# Patient Record
Sex: Male | Born: 1935 | Race: White | Hispanic: No | Smoking: Never smoker
Health system: Southern US, Community
[De-identification: ages and names within clinical notes are randomized; demographics above are authoritative.]

## PROBLEM LIST (undated history)

## (undated) DIAGNOSIS — I82890 Acute embolism and thrombosis of other specified veins: Secondary | ICD-10-CM

## (undated) DIAGNOSIS — I639 Cerebral infarction, unspecified: Secondary | ICD-10-CM

## (undated) DIAGNOSIS — K219 Gastro-esophageal reflux disease without esophagitis: Secondary | ICD-10-CM

## (undated) DIAGNOSIS — E119 Type 2 diabetes mellitus without complications: Secondary | ICD-10-CM

## (undated) DIAGNOSIS — K852 Alcohol induced acute pancreatitis without necrosis or infection: Secondary | ICD-10-CM

## (undated) DIAGNOSIS — I10 Essential (primary) hypertension: Secondary | ICD-10-CM

## (undated) HISTORY — PX: APPENDECTOMY: SHX54

## (undated) HISTORY — PX: VASCULAR SURGERY: SHX849

## (undated) HISTORY — PX: CAROTID ANGIOGRAPHY: CATH118230

## (undated) HISTORY — PX: OTHER SURGICAL HISTORY: SHX169

---

## 2018-08-10 ENCOUNTER — Other Ambulatory Visit: Payer: Self-pay

## 2018-08-10 ENCOUNTER — Emergency Department (HOSPITAL_COMMUNITY): Payer: Medicare Other

## 2018-08-10 ENCOUNTER — Emergency Department (HOSPITAL_COMMUNITY)
Admission: EM | Admit: 2018-08-10 | Discharge: 2018-08-11 | Disposition: A | Discharge status: Home / Self Care | Payer: Medicare Other | Attending: Emergency Medicine | Admitting: Emergency Medicine

## 2018-08-10 ENCOUNTER — Encounter (HOSPITAL_COMMUNITY): Payer: Self-pay | Admitting: Emergency Medicine

## 2018-08-10 DIAGNOSIS — W010XXA Fall on same level from slipping, tripping and stumbling without subsequent striking against object, initial encounter: Secondary | ICD-10-CM | POA: Insufficient documentation

## 2018-08-10 DIAGNOSIS — Y929 Unspecified place or not applicable: Secondary | ICD-10-CM | POA: Insufficient documentation

## 2018-08-10 DIAGNOSIS — S0181XA Laceration without foreign body of other part of head, initial encounter: Secondary | ICD-10-CM | POA: Insufficient documentation

## 2018-08-10 DIAGNOSIS — S43005A Unspecified dislocation of left shoulder joint, initial encounter: Secondary | ICD-10-CM | POA: Insufficient documentation

## 2018-08-10 DIAGNOSIS — Z23 Encounter for immunization: Secondary | ICD-10-CM | POA: Insufficient documentation

## 2018-08-10 DIAGNOSIS — E119 Type 2 diabetes mellitus without complications: Secondary | ICD-10-CM | POA: Insufficient documentation

## 2018-08-10 DIAGNOSIS — Y998 Other external cause status: Secondary | ICD-10-CM | POA: Diagnosis not present

## 2018-08-10 DIAGNOSIS — I10 Essential (primary) hypertension: Secondary | ICD-10-CM | POA: Insufficient documentation

## 2018-08-10 DIAGNOSIS — Y9389 Activity, other specified: Secondary | ICD-10-CM | POA: Insufficient documentation

## 2018-08-10 DIAGNOSIS — S0990XA Unspecified injury of head, initial encounter: Secondary | ICD-10-CM | POA: Diagnosis present

## 2018-08-10 HISTORY — DX: Alcohol induced acute pancreatitis without necrosis or infection: K85.20

## 2018-08-10 HISTORY — DX: Essential (primary) hypertension: I10

## 2018-08-10 HISTORY — DX: Type 2 diabetes mellitus without complications: E11.9

## 2018-08-10 HISTORY — DX: Gastro-esophageal reflux disease without esophagitis: K21.9

## 2018-08-10 MED ORDER — TETANUS-DIPHTH-ACELL PERTUSSIS 5-2.5-18.5 LF-MCG/0.5 IM SUSP
0.5000 mL | Freq: Once | INTRAMUSCULAR | Status: AC
  Administered 2018-08-10: 0.5 mL via INTRAMUSCULAR
  Filled 2018-08-10: qty 0.5

## 2018-08-10 MED ORDER — LIDOCAINE-EPINEPHRINE (PF) 1 %-1:200000 IJ SOLN
10.0000 mL | Freq: Once | INTRAMUSCULAR | Status: AC
  Administered 2018-08-10: 10 mL
  Filled 2018-08-10: qty 30

## 2018-08-10 NOTE — ED Triage Notes (Addendum)
Pt to ED via [facility] after fall. Pt has been drinking ETOH, got up to use the restroom and fell. Pt has laceration above the left eye and left arm pain. Pt currently taking Plavix but is unsure right now why.

## 2018-08-10 NOTE — ED Notes (Signed)
Pt cleaned up, placed in gown, suture cart at bedside if needed.

## 2018-08-10 NOTE — ED Provider Notes (Signed)
[facility] EMERGENCY DEPARTMENT Provider Note   CSN: [id number] Arrival date & time: 08/10/18  2235    History   Chief Complaint Chief Complaint  Patient presents with  . Fall  . Laceration    HPI Jon Bennett is a 84 y.o. male.     Patient presents for evaluation after a fall.  He admits that he excessively drank alcohol tonight and lost his balance, causing him to fall.  He reports trying to catch himself when he fell and injuring his left arm.  He is complaining of pain at the area of the left shoulder and upper arm region.  Pain worsens with movement.  He does have a headache, complains of a laceration on his forehead.     Past Medical History:  Diagnosis Date  . Diabetes mellitus without complication (HCC)   . GERD (gastroesophageal reflux disease)   . Hypertension   . Pancreatitis, alcoholic, acute     There are no active problems to display for this patient.   Past Surgical History:  Procedure Laterality Date  . VASCULAR SURGERY          Home Medications    Prior to Admission medications   Not on File    Family History No family history on file.  Social History Social History   Tobacco Use  . Smoking status: Never Smoker  . Smokeless tobacco: Never Used  Substance Use Topics  . Alcohol use: Yes  . Drug use: Never     Allergies   Asa [aspirin]   Review of Systems Review of Systems  Musculoskeletal: Positive for arthralgias.  Skin: Positive for wound.  Neurological: Positive for headaches.  All other systems reviewed and are negative.    Physical Exam Updated Vital Signs BP 108/89   Pulse (!) 101   Temp 98.4 F (36.9 C) (Oral)   Resp 18   Ht  (1.753 m)   Wt 78 kg   SpO2 93%   BMI 25.40 kg/m   Physical Exam Vitals signs and nursing note reviewed.  Constitutional:      General: He is not in acute distress.    Appearance: Normal appearance. He is well-developed.  HENT:     Head: Normocephalic. Contusion (Left  side of forehead) and laceration (Left forehead) present.      Right Ear: Hearing normal.     Left Ear: Hearing normal.     Nose: Nose normal.  Eyes:     Conjunctiva/sclera: Conjunctivae normal.     Pupils: Pupils are equal, round, and reactive to light.  Neck:     Musculoskeletal: Normal range of motion and neck supple.  Cardiovascular:     Rate and Rhythm: Regular rhythm.     Heart sounds: S1 normal and S2 normal. No murmur. No friction rub. No gallop.   Pulmonary:     Effort: Pulmonary effort is normal. No respiratory distress.     Breath sounds: Normal breath sounds.  Chest:     Chest wall: No tenderness.  Abdominal:     General: Bowel sounds are normal.     Palpations: Abdomen is soft.     Tenderness: There is no abdominal tenderness. There is no guarding or rebound. Negative signs include Murphy's sign and McBurney's sign.     Hernia: No hernia is present.  Musculoskeletal:     Left shoulder: He exhibits decreased range of motion and tenderness (Proximal humerus). He exhibits no deformity.     Left elbow: He exhibits  deformity. Tenderness found.  Skin:    General: Skin is warm and dry.     Findings: Laceration (Left forehead) present. No rash.  Neurological:     Mental Status: He is alert and oriented to person, place, and time.     GCS: GCS eye subscore is 4. GCS verbal subscore is 5. GCS motor subscore is 6.     Cranial Nerves: No cranial nerve deficit.     Sensory: No sensory deficit.     Coordination: Coordination normal.  Psychiatric:        Speech: Speech normal.        Behavior: Behavior normal.        Thought Content: Thought content normal.      ED Treatments / Results  Labs (all labs ordered are listed, but only abnormal results are displayed) Labs Reviewed - No data to display  EKG None  Radiology Dg Elbow Complete Left  Result Date: 08/11/2018 CLINICAL DATA:  Recent fall with left-sided elbow pain, initial encounter EXAM: LEFT ELBOW - COMPLETE  3+ VIEW COMPARISON:  None. FINDINGS: Mild osteophytic changes are noted from the olecranon. No acute fracture or dislocation is noted. IMPRESSION: Mild degenerative change without acute abnormality. Electronically Signed   By: Alcide Clever M.D.   On: 08/11/2018 01:53   Ct Head Wo Contrast  Result Date: 08/11/2018 CLINICAL DATA:  Recent fall EXAM: CT HEAD WITHOUT CONTRAST CT CERVICAL SPINE WITHOUT CONTRAST TECHNIQUE: Multidetector CT imaging of the head and cervical spine was performed following the standard protocol without intravenous contrast. Multiplanar CT image reconstructions of the cervical spine were also generated. COMPARISON:  None. FINDINGS: CT HEAD FINDINGS Brain: Mild atrophic changes are noted. Chronic white matter ischemic changes seen. No findings to suggest acute hemorrhage, acute infarction or space-occupying mass lesion are noted. Vascular: No hyperdense vessel or unexpected calcification. Skull: Normal. Negative for fracture or focal lesion. Sinuses/Orbits: No acute finding. Other: Mild soft tissue swelling is noted in the left supraorbital region. CT CERVICAL SPINE FINDINGS Alignment: Within normal limits. Skull base and vertebrae: 7 cervical segments are well visualized. Vertebral body height is well maintained. Mild osteophytic changes noted. Facet hypertrophic changes are noted. No acute fracture or acute facet abnormality is noted. Soft tissues and spinal canal: Surrounding soft tissue structures show no acute abnormality. Upper chest: Lung apices are within normal limits. Other: None IMPRESSION: CT of the head: Chronic atrophic and ischemic changes. Left supraorbital soft tissue swelling consistent with the recent injury. CT of the cervical spine: Multilevel degenerative change without acute abnormality. Electronically Signed   By: Alcide Clever M.D.   On: 08/11/2018 01:49   Ct Cervical Spine Wo Contrast  Result Date: 08/11/2018 CLINICAL DATA:  Recent fall EXAM: CT HEAD WITHOUT  CONTRAST CT CERVICAL SPINE WITHOUT CONTRAST TECHNIQUE: Multidetector CT imaging of the head and cervical spine was performed following the standard protocol without intravenous contrast. Multiplanar CT image reconstructions of the cervical spine were also generated. COMPARISON:  None. FINDINGS: CT HEAD FINDINGS Brain: Mild atrophic changes are noted. Chronic white matter ischemic changes seen. No findings to suggest acute hemorrhage, acute infarction or space-occupying mass lesion are noted. Vascular: No hyperdense vessel or unexpected calcification. Skull: Normal. Negative for fracture or focal lesion. Sinuses/Orbits: No acute finding. Other: Mild soft tissue swelling is noted in the left supraorbital region. CT CERVICAL SPINE FINDINGS Alignment: Within normal limits. Skull base and vertebrae: 7 cervical segments are well visualized. Vertebral body height is well maintained. Mild osteophytic changes  noted. Facet hypertrophic changes are noted. No acute fracture or acute facet abnormality is noted. Soft tissues and spinal canal: Surrounding soft tissue structures show no acute abnormality. Upper chest: Lung apices are within normal limits. Other: None IMPRESSION: CT of the head: Chronic atrophic and ischemic changes. Left supraorbital soft tissue swelling consistent with the recent injury. CT of the cervical spine: Multilevel degenerative change without acute abnormality. Electronically Signed   By: Alcide Clever M.D.   On: 08/11/2018 01:49   Dg Shoulder Left  Result Date: 08/11/2018 CLINICAL DATA:  Recent fall with shoulder pain, initial encounter EXAM: LEFT SHOULDER - 2+ VIEW COMPARISON:  None. FINDINGS: Anterior inferior dislocation of the humeral head is noted with respect to the glenoid. Some fragmentation of the glenoid is seen. There are changes suspicious for an avulsion from the greater tuberosity. Degenerative changes of the acromioclavicular joint are seen IMPRESSION: Anterior inferior dislocation of  the humeral head. There are changes consistent with glenoid fracture as well as findings suspicious for a greater tuberosity fracture from the. Electronically Signed   By: Alcide Clever M.D.   On: 08/11/2018 01:54    Procedures .Marland KitchenLaceration Repair Date/Time: 08/11/2018 1:32 AM Performed by: Gilda Crease, MD Authorized by: Gilda Crease, MD   Consent:    Consent obtained:  Verbal   Consent given by:  Patient   Risks discussed:  Infection, pain and poor cosmetic result Universal protocol:    Procedure explained and questions answered to patient or proxy's satisfaction: yes     Relevant documents present and verified: yes     Test results available and properly labeled: yes     Imaging studies available: yes     Required blood products, implants, devices, and special equipment available: yes     Site/side marked: yes     Immediately prior to procedure, a time out was called: yes     Patient identity confirmed:  Verbally with patient Anesthesia (see MAR for exact dosages):    Anesthesia method:  Local infiltration   Local anesthetic:  Lidocaine 1% WITH epi Laceration details:    Location:  Face   Face location:  Forehead   Length (cm):  1.5 Repair type:    Repair type:  Simple Pre-procedure details:    Preparation:  Patient was prepped and draped in usual sterile fashion and imaging obtained to evaluate for foreign bodies Exploration:    Hemostasis achieved with:  Epinephrine   Contaminated: no   Treatment:    Area cleansed with:  Shur-Clens   Amount of cleaning:  Standard   Irrigation solution:  Sterile saline   Irrigation method:  Syringe Skin repair:    Repair method:  Sutures   Suture size:  5-0   Suture material:  Fast-absorbing gut   Number of sutures:  3 Approximation:    Approximation:  Close Post-procedure details:    Dressing:  Open (no dressing)  .Sedation Date/Time: 08/11/2018 2:24 AM Performed by: Gilda Crease, MD Authorized by:  Gilda Crease, MD   Consent:    Consent obtained:  Verbal   Consent given by:  Patient   Risks discussed:  Prolonged hypoxia resulting in organ damage, respiratory compromise necessitating ventilatory assistance and intubation and inadequate sedation Universal protocol:    Procedure explained and questions answered to patient or proxy's satisfaction: yes     Relevant documents present and verified: yes     Test results available and properly labeled: yes     Imaging  studies available: yes     Required blood products, implants, devices, and special equipment available: yes     Site/side marked: yes     Immediately prior to procedure a time out was called: yes     Patient identity confirmation method:  Verbally with patient Indications:    Procedure performed:  Dislocation reduction   Procedure necessitating sedation performed by:  Physician performing sedation Pre-sedation assessment:    Time since last food or drink:  8 hours   ASA classification: class 2 - patient with mild systemic disease     Neck mobility: reduced     Mouth opening:  3 or more finger widths   Thyromental distance:  3 finger widths   Mallampati score:  I - soft palate, uvula, fauces, pillars visible   Pre-sedation assessments completed and reviewed: airway patency, cardiovascular function, hydration status, mental status, nausea/vomiting, pain level, respiratory function and temperature     Pre-sedation assessment completed:  08/11/2018 2:20 AM Immediate pre-procedure details:    Reassessment: Patient reassessed immediately prior to procedure     Reviewed: vital signs, relevant labs/tests and NPO status     Verified: bag valve mask available, emergency equipment available, intubation equipment available, IV patency confirmed and oxygen available   Procedure details (see MAR for exact dosages):    Preoxygenation:  Nasal cannula   Sedation:  Ketamine and propofol   Intra-procedure monitoring:  Blood  pressure monitoring, cardiac monitor, continuous pulse oximetry, frequent LOC assessments and frequent vital sign checks   Intra-procedure events: none     Total Provider sedation time (minutes):  20 Post-procedure details:    Post-sedation assessment completed:  08/11/2018 2:45 AM   Attendance: Constant attendance by certified staff until patient recovered     Recovery: Patient returned to pre-procedure baseline     Post-sedation assessments completed and reviewed: airway patency, cardiovascular function, hydration status, mental status, nausea/vomiting, pain level and respiratory function     Patient is stable for discharge or admission: yes   .Ortho Injury Treatment Date/Time: 08/11/2018 2:26 AM Performed by: Gilda CreasePollina, Romona Murdy J, MD Authorized by: Gilda CreasePollina, Markiah Janeway J, MD   Consent:    Consent obtained:  Verbal   Consent given by:  Patient   Risks discussed:  Fracture, nerve damage, restricted joint movement, vascular damage, recurrent dislocation and irreducible dislocation Universal protocol:    Procedure explained and questions answered to patient or proxy's satisfaction: yes     Relevant documents present and verified: yes     Test results available and properly labeled: yes     Imaging studies available: yes     Required blood products, implants, devices, and special equipment available: yes     Site/side marked: yes     Immediately prior to procedure a time out was called: yes     Patient identity confirmed:  Verbally with patientInjury location: shoulder Location details: left shoulder Injury type: dislocation Dislocation type: anterior Chronicity: new Pre-procedure neurovascular assessment: neurovascularly intact Pre-procedure distal perfusion: normal Pre-procedure neurological function: normal Pre-procedure range of motion: reduced  Anesthesia: Local anesthesia used: no  Patient sedated: Yes. Refer to sedation procedure documentation for details of sedation.  Manipulation performed: yes Reduction method: external rotation, Milch technique, scapular manipulation and traction and counter traction Reduction successful: yes X-ray confirmed reduction: yes Immobilization: sling Post-procedure neurovascular assessment: post-procedure neurovascularly intact Post-procedure distal perfusion: normal Post-procedure neurological function: normal Post-procedure range of motion: improved Patient tolerance: Patient tolerated the procedure well with no immediate complications    (  including critical care time)  Medications Ordered in ED Medications  ketamine (KETALAR) injection 39 mg (has no administration in time range)  propofol (DIPRIVAN) 10 mg/mL bolus/IV push 39 mg (has no administration in time range)  ondansetron (ZOFRAN) 4 MG/2ML injection (has no administration in time range)  propofol (DIPRIVAN) 10 mg/mL bolus/IV push 39 mg (has no administration in time range)  ondansetron (ZOFRAN) 4 MG/5ML solution 4 mg (has no administration in time range)  Tdap (BOOSTRIX) injection 0.5 mL (0.5 mLs Intramuscular Given 08/10/18 2347)  lidocaine-EPINEPHrine (XYLOCAINE-EPINEPHrine) 1 %-1:200000 (PF) injection 10 mL (10 mLs Infiltration Given by Other 08/10/18 2348)     Initial Impression / Assessment and Plan / ED Course  I have reviewed the triage vital signs and the nursing notes.  Pertinent labs & imaging results that were available during my care of the patient were reviewed by me and considered in my medical decision making (see chart for details).        Patient presents to the emergency department for evaluation after a fall.  Patient admits to excessive alcohol intake tonight which was the cause of the fall.  No syncope or loss of consciousness.  CT head and cervical spine performed, no acute injury noted.  Patient complaining of left elbow and left shoulder injury.  X-ray shows dislocation of left shoulder with likely tuberosity and glenoid fracture.   This was discussed with the patient, he did give consent for closed reduction which was performed without difficulty.  Patient placed in a sling sling immobilizer, will follow-up with orthopedics.  Patient had a laceration on his forehead that was repaired.  This was repaired with fast-absorbing gut, will not require suture removal.  Watch for signs of infection.  Final Clinical Impressions(s) / ED Diagnoses   Final diagnoses:  Facial laceration, initial encounter  Shoulder dislocation, left, initial encounter    ED Discharge Orders    None       Abbegayle Denault, Canary Brim, MD 08/11/18 [id number]

## 2018-08-11 ENCOUNTER — Other Ambulatory Visit: Payer: Self-pay

## 2018-08-11 ENCOUNTER — Emergency Department (HOSPITAL_COMMUNITY): Payer: Medicare Other

## 2018-08-11 DIAGNOSIS — F10231 Alcohol dependence with withdrawal delirium: Secondary | ICD-10-CM | POA: Diagnosis not present

## 2018-08-11 MED ORDER — PROPOFOL 10 MG/ML IV BOLUS
0.5000 mg/kg | Freq: Once | INTRAVENOUS | Status: AC
  Administered 2018-08-11: 39 mg via INTRAVENOUS
  Filled 2018-08-11: qty 20

## 2018-08-11 MED ORDER — PROPOFOL 10 MG/ML IV BOLUS
0.5000 mg/kg | Freq: Once | INTRAVENOUS | Status: AC
  Administered 2018-08-11: 39 mg via INTRAVENOUS

## 2018-08-11 MED ORDER — KETAMINE HCL 10 MG/ML IJ SOLN
0.5000 mg/kg | Freq: Once | INTRAMUSCULAR | Status: AC
  Administered 2018-08-11: 39 mg via INTRAVENOUS
  Filled 2018-08-11: qty 1

## 2018-08-11 MED ORDER — ONDANSETRON HCL 4 MG/5ML PO SOLN: 4.0000 mg | Freq: Once | ORAL | Status: DC

## 2018-08-11 MED ORDER — ONDANSETRON HCL 4 MG/2ML IJ SOLN
INTRAMUSCULAR | Status: AC
  Administered 2018-08-11: 4 mg
  Filled 2018-08-11: qty 2

## 2018-08-11 MED ORDER — FAMOTIDINE 20 MG PO TABS
40.0000 mg | ORAL_TABLET | Freq: Once | ORAL | Status: AC
  Administered 2018-08-11: 40 mg via ORAL
  Filled 2018-08-11: qty 2

## 2018-08-11 NOTE — Discharge Instructions (Addendum)
You will need to keep your arm in the sling immobilizer at all times until you see orthopedic surgeon for follow-up.  The sutures that we placed in your forehead today will dissolve, you do not need to be removed.  Watch for signs of infection, return if they occur.

## 2018-08-11 NOTE — ED Notes (Signed)
Patient transported to CT and X ray 

## 2018-08-13 ENCOUNTER — Other Ambulatory Visit: Payer: Self-pay

## 2018-08-13 ENCOUNTER — Encounter (HOSPITAL_COMMUNITY): Payer: Self-pay | Admitting: Emergency Medicine

## 2018-08-13 ENCOUNTER — Inpatient Hospital Stay (HOSPITAL_COMMUNITY)
Admission: EM | Admit: 2018-08-13 | Discharge: 2018-08-18 | DRG: 897 | Disposition: A | Discharge status: Skilled Nursing Facility | Payer: Medicare Other | Attending: Internal Medicine | Admitting: Internal Medicine

## 2018-08-13 ENCOUNTER — Emergency Department (HOSPITAL_COMMUNITY): Payer: Medicare Other

## 2018-08-13 DIAGNOSIS — W19XXXD Unspecified fall, subsequent encounter: Secondary | ICD-10-CM

## 2018-08-13 DIAGNOSIS — F10931 Alcohol use, unspecified with withdrawal delirium: Secondary | ICD-10-CM

## 2018-08-13 DIAGNOSIS — K709 Alcoholic liver disease, unspecified: Secondary | ICD-10-CM | POA: Diagnosis present

## 2018-08-13 DIAGNOSIS — F10232 Alcohol dependence with withdrawal with perceptual disturbance: Secondary | ICD-10-CM | POA: Diagnosis not present

## 2018-08-13 DIAGNOSIS — D6959 Other secondary thrombocytopenia: Secondary | ICD-10-CM | POA: Diagnosis present

## 2018-08-13 DIAGNOSIS — I10 Essential (primary) hypertension: Secondary | ICD-10-CM | POA: Diagnosis present

## 2018-08-13 DIAGNOSIS — F10939 Alcohol use, unspecified with withdrawal, unspecified: Secondary | ICD-10-CM | POA: Diagnosis present

## 2018-08-13 DIAGNOSIS — D696 Thrombocytopenia, unspecified: Secondary | ICD-10-CM

## 2018-08-13 DIAGNOSIS — F329 Major depressive disorder, single episode, unspecified: Secondary | ICD-10-CM | POA: Diagnosis present

## 2018-08-13 DIAGNOSIS — R739 Hyperglycemia, unspecified: Secondary | ICD-10-CM | POA: Diagnosis present

## 2018-08-13 DIAGNOSIS — R197 Diarrhea, unspecified: Secondary | ICD-10-CM | POA: Diagnosis present

## 2018-08-13 DIAGNOSIS — F419 Anxiety disorder, unspecified: Secondary | ICD-10-CM | POA: Diagnosis present

## 2018-08-13 DIAGNOSIS — S42292D Other displaced fracture of upper end of left humerus, subsequent encounter for fracture with routine healing: Secondary | ICD-10-CM

## 2018-08-13 DIAGNOSIS — F10239 Alcohol dependence with withdrawal, unspecified: Secondary | ICD-10-CM | POA: Diagnosis present

## 2018-08-13 DIAGNOSIS — Z1159 Encounter for screening for other viral diseases: Secondary | ICD-10-CM

## 2018-08-13 DIAGNOSIS — F10231 Alcohol dependence with withdrawal delirium: Principal | ICD-10-CM | POA: Diagnosis present

## 2018-08-13 LAB — BASIC METABOLIC PANEL
Anion gap: 12 (ref 5–15)
BUN: 14 mg/dL (ref 8–23)
CO2: 24 mmol/L (ref 22–32)
Calcium: 9.1 mg/dL (ref 8.9–10.3)
Chloride: 107 mmol/L (ref 98–111)
Creatinine, Ser: 0.93 mg/dL (ref 0.61–1.24)
GFR calc Af Amer: >60 mL/min (ref >60)
GFR calc non Af Amer: >60 mL/min (ref >60)
Glucose, Bld: 133 mg/dL — ABNORMAL HIGH (ref 70–99)
Potassium: 3.5 mmol/L (ref 3.5–5.1)
Sodium: 143 mmol/L (ref 135–145)

## 2018-08-13 LAB — CBC WITH DIFFERENTIAL/PLATELET
Abs Immature Granulocytes: 0.04 10*3/uL (ref 0.00–0.07)
Basophils Absolute: 0 10*3/uL (ref 0.0–0.1)
Basophils Relative: 0 %
Eosinophils Absolute: 0.1 10*3/uL (ref 0.0–0.5)
Eosinophils Relative: 1 %
HCT: 45 % (ref 39.0–52.0)
Hemoglobin: 15.5 g/dL (ref 13.0–17.0)
Immature Granulocytes: 0 %
Lymphocytes Relative: 12 %
Lymphs Abs: 1.3 10*3/uL (ref 0.7–4.0)
MCH: 32.4 pg (ref 26.0–34.0)
MCHC: 34.4 g/dL (ref 30.0–36.0)
MCV: 94.1 fL (ref 80.0–100.0)
Monocytes Absolute: 0.9 10*3/uL (ref 0.1–1.0)
Monocytes Relative: 8 %
Neutro Abs: 8.8 10*3/uL — ABNORMAL HIGH (ref 1.7–7.7)
Neutrophils Relative %: 79 %
Platelets: 154 10*3/uL (ref 150–400)
RBC: 4.78 MIL/uL (ref 4.22–5.81)
RDW: 12.8 % (ref 11.5–15.5)
WBC: 11.2 10*3/uL — ABNORMAL HIGH (ref 4.0–10.5)
nRBC: 0 % (ref 0.0–0.2)

## 2018-08-13 LAB — URINALYSIS, ROUTINE W REFLEX MICROSCOPIC
Bilirubin Urine: NEGATIVE
Glucose, UA: NEGATIVE mg/dL
Ketones, ur: 20 mg/dL — AB
Nitrite: POSITIVE — AB
Protein, ur: NEGATIVE mg/dL
Specific Gravity, Urine: 1.021 (ref 1.005–1.030)
pH: 5 (ref 5.0–8.0)

## 2018-08-13 LAB — RAPID URINE DRUG SCREEN, HOSP PERFORMED
Amphetamines: NOT DETECTED
Barbiturates: NOT DETECTED
Benzodiazepines: POSITIVE — AB
Cocaine: NOT DETECTED
Opiates: NOT DETECTED
Tetrahydrocannabinol: NOT DETECTED

## 2018-08-13 LAB — ETHANOL: Alcohol, Ethyl (B): <10 mg/dL (ref <10)

## 2018-08-13 LAB — SARS CORONAVIRUS 2 BY RT PCR (HOSPITAL ORDER, PERFORMED IN ~~LOC~~ HOSPITAL LAB): SARS Coronavirus 2: NEGATIVE

## 2018-08-13 LAB — GLUCOSE, CAPILLARY
Glucose-Capillary: 114 mg/dL — ABNORMAL HIGH (ref 70–99)
Glucose-Capillary: 102 mg/dL — ABNORMAL HIGH (ref 70–99)
Glucose-Capillary: 99 mg/dL (ref 70–99)

## 2018-08-13 LAB — HEMOGLOBIN A1C
Hgb A1c MFr Bld: 5.2 % (ref 4.8–5.6)
Mean Plasma Glucose: 102.54 mg/dL

## 2018-08-13 MED ORDER — LORAZEPAM 1 MG PO TABS: 0.0000 mg | ORAL_TABLET | Freq: Four times a day (QID) | ORAL | Status: AC

## 2018-08-13 MED ORDER — DIAZEPAM 2 MG PO TABS
2.0000 mg | ORAL_TABLET | Freq: Once | ORAL | Status: AC
  Administered 2018-08-13: 09:00:00 2 mg via ORAL
  Filled 2018-08-13: qty 1

## 2018-08-13 MED ORDER — INSULIN ASPART 100 UNIT/ML ~~LOC~~ SOLN
0.0000 [IU] | Freq: Three times a day (TID) | SUBCUTANEOUS | Status: DC
  Administered 2018-08-15 – 2018-08-16 (×3): 1 [IU] via SUBCUTANEOUS

## 2018-08-13 MED ORDER — LORAZEPAM 2 MG/ML IJ SOLN
0.0000 mg | Freq: Four times a day (QID) | INTRAMUSCULAR | Status: AC
  Administered 2018-08-13: 12:00:00 2 mg via INTRAVENOUS
  Administered 2018-08-13: 05:00:00 1 mg via INTRAVENOUS
  Administered 2018-08-13 – 2018-08-15 (×6): 2 mg via INTRAVENOUS
  Filled 2018-08-13 (×8): qty 1

## 2018-08-13 MED ORDER — ACETAMINOPHEN 650 MG RE SUPP: 650.0000 mg | Freq: Four times a day (QID) | RECTAL | Status: DC | PRN

## 2018-08-13 MED ORDER — ACETAMINOPHEN 325 MG PO TABS
650.0000 mg | ORAL_TABLET | Freq: Four times a day (QID) | ORAL | Status: DC | PRN
  Administered 2018-08-13 – 2018-08-18 (×5): 650 mg via ORAL
  Filled 2018-08-13 (×6): qty 2

## 2018-08-13 MED ORDER — LORAZEPAM 1 MG PO TABS
0.0000 mg | ORAL_TABLET | Freq: Two times a day (BID) | ORAL | Status: AC
  Administered 2018-08-16: 2 mg via ORAL
  Administered 2018-08-17: 1 mg via ORAL
  Filled 2018-08-13: qty 1
  Filled 2018-08-13: qty 2

## 2018-08-13 MED ORDER — LORAZEPAM 2 MG/ML IJ SOLN
0.0000 mg | Freq: Two times a day (BID) | INTRAMUSCULAR | Status: AC
  Administered 2018-08-15 – 2018-08-16 (×2): 2 mg via INTRAVENOUS
  Filled 2018-08-13 (×2): qty 1

## 2018-08-13 MED ORDER — THIAMINE HCL 100 MG/ML IJ SOLN: 100.0000 mg | Freq: Every day | INTRAMUSCULAR | Status: DC

## 2018-08-13 MED ORDER — ONDANSETRON HCL 4 MG PO TABS: 4.0000 mg | ORAL_TABLET | Freq: Four times a day (QID) | ORAL | Status: DC | PRN

## 2018-08-13 MED ORDER — SODIUM CHLORIDE 0.9 % IV SOLN
INTRAVENOUS | Status: DC
  Administered 2018-08-13 (×2): via INTRAVENOUS

## 2018-08-13 MED ORDER — ONDANSETRON HCL 4 MG/2ML IJ SOLN
4.0000 mg | Freq: Four times a day (QID) | INTRAMUSCULAR | Status: DC | PRN
  Administered 2018-08-17: 4 mg via INTRAVENOUS
  Filled 2018-08-13: qty 2

## 2018-08-13 MED ORDER — VITAMIN B-1 100 MG PO TABS
100.0000 mg | ORAL_TABLET | Freq: Every day | ORAL | Status: DC
  Administered 2018-08-14 – 2018-08-18 (×5): 100 mg via ORAL
  Filled 2018-08-13 (×5): qty 1

## 2018-08-13 NOTE — Plan of Care (Signed)
  Problem: Safety: Goal: Ability to remain free from injury will improve Outcome: Progressing   Problem: Education: Goal: Knowledge of General Education information will improve Description Including pain rating scale, medication(s)/side effects and non-pharmacologic comfort measures Outcome: Progressing   Problem: Clinical Measurements: Goal: Ability to maintain clinical measurements within normal limits will improve Outcome: Progressing Goal: Will remain free from infection Outcome: Progressing Goal: Diagnostic test results will improve Outcome: Progressing Goal: Respiratory complications will improve Outcome: Progressing Goal: Cardiovascular complication will be avoided Outcome: Progressing   Problem: Coping: Goal: Level of anxiety will decrease Outcome: Progressing

## 2018-08-13 NOTE — ED Notes (Signed)
Emergency contact Julez Zong (son) [phone] Son contacted and advised patient will be admitted.

## 2018-08-13 NOTE — H&P (Addendum)
TRH H&P    Patient Demographics:    Jon Bennett, is a 84 y.o. male  MRN: [id number]  DOB - 30-Oct-1934  Admit Date - 08/13/2018  Referring MD/NP/PA: Dr. Wilkie Aye  Outpatient Primary MD for the patient is [name], MD  Patient coming from: Home  Chief complaint-altered mental status   HPI:    Jon Bennett  is a 84 y.o. male, with history of diabetes mellitus type 2, hypertension, recent fall with resultant left shoulder dislocation who was brought to ED with altered mental status.  Patient with his son and son reported that patient was having hallucinations with concern for alcohol withdrawal and so he was brought to ED.  As per son patient has not had any alcohol since Thursday.He does complain of left shoulder pain. There is no chest pain, no shortness of breath. Denies nausea vomiting or diarrhea. Patient son reported hospitalization 1 year ago when he was placed in a coma because of alcohol withdrawal.  Patient is a daily drinker.  No previous history of alcohol withdrawal seizures. In the ED patient received 1 mg of Ativan CIWA protocol initiated.    Review of systems:    In addition to the HPI above,   All other systems reviewed and are negative.    Past History of the following :    Past Medical History:  Diagnosis Date  . Diabetes mellitus without complication (HCC)   . GERD (gastroesophageal reflux disease)   . Hypertension   . Pancreatitis, alcoholic, acute       Past Surgical History:  Procedure Laterality Date  . VASCULAR SURGERY        Social History:      Social History   Tobacco Use  . Smoking status: Never Smoker  . Smokeless tobacco: Never Used  Substance Use Topics  . Alcohol use: Yes    Alcohol/week: 4.0 - 5.0 standard drinks    Types: 4 - 5 Shots of liquor per week    Comment: "half a pint of whiskey daily"       Family History :   Unobtainable as  patient unable to provide significant history.   Home Medications:   Prior to Admission medications   Not on File     Allergies:     Allergies  Allergen Reactions  . Asa [Aspirin] Shortness Of Breath     Physical Exam:   Vitals  Blood pressure (!) 148/98, pulse 91, temperature 98 F (36.7 C), temperature source Oral, resp. rate 18, height 5\' 9"  (1.753 m), weight 77.1 kg, SpO2 98 %.  1.  General: Appears in no acute distress  2. Psychiatric: Alert, oriented x2, lacks insight and judgment  3. Neurologic: Cranial nerves II through grossly intact, moving all extremities  4. HEENMT:  Atraumatic normocephalic, extraocular muscle intact  5. Respiratory : Clear to auscultation bilaterally  6. Cardiovascular : S1-S2, regular, no murmur auscultated  7. Gastrointestinal:  Abdomen is soft, nontender, no organomegaly      Data Review:    CBC Recent Labs  Lab  08/13/18 0452  WBC 11.2*  HGB 15.5  HCT 45.0  PLT 154  MCV 94.1  MCH 32.4  MCHC 34.4  RDW 12.8  LYMPHSABS 1.3  MONOABS 0.9  EOSABS 0.1  BASOSABS 0.0   ------------------------------------------------------------------------------------------------------------------  Results for orders placed or performed during the hospital encounter of 08/13/18 (from the past 48 hour(s))  CBC with Differential     Status: Abnormal   Collection Time: 08/13/18  4:52 AM  Result Value Ref Range   WBC 11.2 (H) 4.0 - 10.5 K/uL   RBC 4.78 4.22 - 5.81 MIL/uL   Hemoglobin 15.5 13.0 - 17.0 g/dL   HCT 16.1 09.6 - 04.5 %   MCV 94.1 80.0 - 100.0 fL   MCH 32.4 26.0 - 34.0 pg   MCHC 34.4 30.0 - 36.0 g/dL   RDW 40.9 81.1 - 91.4 %   Platelets 154 150 - 400 K/uL   nRBC 0.0 0.0 - 0.2 %   Neutrophils Relative % 79 %   Neutro Abs 8.8 (H) 1.7 - 7.7 K/uL   Lymphocytes Relative 12 %   Lymphs Abs 1.3 0.7 - 4.0 K/uL   Monocytes Relative 8 %   Monocytes Absolute 0.9 0.1 - 1.0 K/uL   Eosinophils Relative 1 %   Eosinophils Absolute  0.1 0.0 - 0.5 K/uL   Basophils Relative 0 %   Basophils Absolute 0.0 0.0 - 0.1 K/uL   Immature Granulocytes 0 %   Abs Immature Granulocytes 0.04 0.00 - 0.07 K/uL    Comment: Performed at [facility], [address], [place], [place] [address]  Basic metabolic panel     Status: Abnormal   Collection Time: 08/13/18  4:52 AM  Result Value Ref Range   Sodium 143 135 - 145 mmol/L   Potassium 3.5 3.5 - 5.1 mmol/L   Chloride 107 98 - 111 mmol/L   CO2 24 22 - 32 mmol/L   Glucose, Bld 133 (H) 70 - 99 mg/dL   BUN 14 8 - 23 mg/dL   Creatinine, Ser 6.21 0.61 - 1.24 mg/dL   Calcium 9.1 8.9 - 30.8 mg/dL   GFR calc non Af Amer >60 >60 mL/min   GFR calc Af Amer >60 >60 mL/min   Anion gap 12 5 - 15    Comment: Performed at [facility], [address] [place], [place] [address]  Ethanol     Status: None   Collection Time: 08/13/18  4:52 AM  Result Value Ref Range   Alcohol, Ethyl (B) <10 <10 mg/dL    Comment: (NOTE) Lowest detectable limit for serum alcohol is 10 mg/dL. For medical purposes only. Performed at [facility], [address] [place], [place] [address]     Chemistries  Recent Labs  Lab 08/13/18 [phone]  NA 143  K 3.5  CL 107  CO2 24  GLUCOSE 133*  BUN 14  CREATININE 0.93  CALCIUM 9.1     --------------------------------------------------------------------------------------------------------------- Urine analysis: No results found for: COLORURINE, APPEARANCEUR, LABSPEC, PHURINE, GLUCOSEU, HGBUR, BILIRUBINUR, KETONESUR, PROTEINUR, UROBILINOGEN, NITRITE, LEUKOCYTESUR    Imaging Results:    Dg Shoulder Left  Result Date: 08/13/2018 CLINICAL DATA:  Recent left shoulder dislocation EXAM: LEFT SHOULDER - 2+ VIEW COMPARISON:  08/11/2018 FINDINGS: Limited evaluation due to difficulty with patient positioning. Interval relocation of the left shoulder. Associated Hill-Sachs fracture of the posterolateral humeral head, unchanged. No definite fracture of the inferior glenoid rim.  Visualized left lung is clear. IMPRESSION: Interval relocation of the left shoulder. Associated Hill-Sachs fracture  of the posterolateral humeral head, unchanged. No definite fracture of the inferior glenoid rim. Electronically Signed   By: Charline BillsSriyesh  Krishnan M.D.   On: 08/13/2018 05:31    My personal review of EKG: Rhythm NSR   Assessment & Plan:    Active Problems:   Alcohol withdrawal (HCC)   1. Alcohol withdrawal-patient appears to be in mild alcohol withdrawal, patient started on CIWA protocol.  Start on thiamine and folate.  Will monitor on telemetry.  2. Diabetes mellitus type 2-patient not on any oral hypoglycemic agents at home.  Initiate sliding scale insulin with NovoLog.  3. Left shoulder pain-x-ray of left shoulder showed interval relocation of the left shoulder.  No definite fracture of the inferior glenoid rim.  Continue Tylenol PRN.   DVT Prophylaxis-   SCDs   AM Labs Ordered, also please review Full Orders  Family Communication: Admission, patients condition and plan of care including tests being ordered have been discussed with the patient's son on phone  who indicate understanding and agree with the plan and Code Status.  Code Status: Full code  Admission status: Observation: Based on patients clinical presentation and evaluation of above clinical data, I have made determination that patient will need less than during her stay in the hospital.    Time spent in minutes : 60 min   Meredeth IdeGagan S Panda Crossin M.D on 08/13/2018 at 6:26 AM

## 2018-08-13 NOTE — Progress Notes (Signed)
  Patient seen and evaluated, chart reviewed, please see EMR for updated orders. Please see full H&P dictated by admitting physician Dr Sharl Ma for same date of service.    Confusion, disorientation agitation persist, improved after administration of benzos--  Continue benzo per CIWA protocol continue multivitamin   Patient seen and evaluated, chart reviewed, please see EMR for updated orders. Please see full H&P dictated by admitting physician Dr Sharl Ma for same date of service.

## 2018-08-13 NOTE — ED Triage Notes (Signed)
Pt was recently discharged from hospital after a dislocated left shoulder, EMS called out for AMS per family and pt having fidgeting and hallucianations, son called to disclose pt drinks heavily and feels pt is going thru withdrawals, per EMS v/s WNL

## 2018-08-13 NOTE — ED Notes (Signed)
Patient transported to X-ray 

## 2018-08-13 NOTE — ED Provider Notes (Addendum)
[facility] EMERGENCY DEPARTMENT Provider Note   CSN: [id number] Arrival date & time: 08/13/18  4193    History   Chief Complaint Chief Complaint  Patient presents with  . Altered Mental Status    HPI Jon Bennett is a 84 y.o. male.     HPI  This is an 84 year old male with a history of diabetes, hypertension, recent fall with resultant left shoulder dislocation who presents with altered mental status.  Per EMS, they were called out by the patient's son for hallucinations and concerns for alcohol withdrawal.  Patient lives with his son.  Son reports that he has not had any alcohol since Thursday when he was seen.  Patient is unable to tell me why he is here.  He reports left shoulder pain.  He denies any new falls.  He is unsure able to tell me when he last had a drink.  He seems confused about series of events since his fall.  He is not sure what date is but is oriented to month, person, and place.  He denies chest pain, shortness of breath, abdominal pain, nausea, vomiting.  I spoke to the patient's son [name].  He reports that tonight he was confused and kept saying things like "an my dead or alive?"  [name] states that he appeared to be hallucinating.  He reports that he has done this before when he has withdrawn from alcohol.  He believes his last drink was on Thursday.  Reports that he was very tremulous today.  Reports hospitalization approximately 1 year ago where he was "placed in a coma" because of his alcohol withdrawal.  He is unsure whether he has a history of alcohol withdrawal seizures.  He reports that he is a daily drinker.  [place]: [phone]  Past Medical History:  Diagnosis Date  . Diabetes mellitus without complication (HCC)   . GERD (gastroesophageal reflux disease)   . Hypertension   . Pancreatitis, alcoholic, acute     There are no active problems to display for this patient.   Past Surgical History:  Procedure Laterality Date  . VASCULAR SURGERY          Home Medications    Prior to Admission medications   Not on File    Family History History reviewed. No pertinent family history.  Social History Social History   Tobacco Use  . Smoking status: Never Smoker  . Smokeless tobacco: Never Used  Substance Use Topics  . Alcohol use: Yes    Alcohol/week: 4.0 - 5.0 standard drinks    Types: 4 - 5 Shots of liquor per week    Comment: "half a pint of whiskey daily"  . Drug use: Never     Allergies   Asa [aspirin]   Review of Systems Review of Systems  Constitutional: Negative for fever.  Respiratory: Negative for shortness of breath.   Cardiovascular: Negative for chest pain.  Gastrointestinal: Negative for abdominal pain.  Musculoskeletal: Negative for back pain.       Shoulder pain  Neurological: Positive for tremors.  Psychiatric/Behavioral: Positive for confusion.  All other systems reviewed and are negative.    Physical Exam Updated Vital Signs BP (!) 161/102 (BP Location: Right Arm)   Pulse 97   Temp 98 F (36.7 C) (Oral)   Resp (!) 21   Ht 1.753 m (5\' 9" )   Wt 77.1 kg   SpO2 97%   BMI 25.10 kg/m   Physical Exam Vitals signs and nursing note reviewed.  Constitutional:      Appearance: He is well-developed. He is not ill-appearing.  HENT:     Head: Normocephalic.     Comments: Ecchymosis noted about the left eye, laceration of the above the left eyebrow, sutures in place    Mouth/Throat:     Mouth: Mucous membranes are moist.  Eyes:     Pupils: Pupils are equal, round, and reactive to light.     Comments: Pupils 3 mm reactive bilaterally  Neck:     Musculoskeletal: Neck supple.  Cardiovascular:     Rate and Rhythm: Regular rhythm. Tachycardia present.     Heart sounds: Normal heart sounds. No murmur.  Pulmonary:     Effort: Pulmonary effort is normal. No respiratory distress.     Breath sounds: Normal breath sounds. No wheezing.  Abdominal:     General: Bowel sounds are normal.      Palpations: Abdomen is soft.     Tenderness: There is no abdominal tenderness. There is no rebound.  Musculoskeletal:     Comments: Left arm in sling, 2+ radial pulse  Lymphadenopathy:     Cervical: No cervical adenopathy.  Skin:    General: Skin is warm and dry.  Neurological:     Mental Status: He is alert and oriented to person, place, and time.     Comments: Oriented to person, place, month, slight tremor noted, 5 out of 5 strength in all 4 extremities  Psychiatric:     Comments: Confused about sequence of events but cooperative      ED Treatments / Results  Labs (all labs ordered are listed, but only abnormal results are displayed) Labs Reviewed  CBC WITH DIFFERENTIAL/PLATELET - Abnormal; Notable for the following components:      Result Value   WBC 11.2 (*)    Neutro Abs 8.8 (*)    All other components within normal limits  BASIC METABOLIC PANEL - Abnormal; Notable for the following components:   Glucose, Bld 133 (*)    All other components within normal limits  ETHANOL  RAPID URINE DRUG SCREEN, HOSP PERFORMED  URINALYSIS, ROUTINE W REFLEX MICROSCOPIC    EKG EKG Interpretation  Date/Time:  Sunday Aug 13 2018 04:41:40 EDT Ventricular Rate:  108 PR Interval:    QRS Duration: 110 QT Interval:  360 QTC Calculation: 483 R Axis:   -42 Text Interpretation:  Sinus tachycardia Inferior infarct, old Anterior infarct, old Baseline wander in lead(s) V5 no prior for comparison Confirmed by Ross Marcus ([id number]) on 08/13/2018 5:10:15 AM   Radiology Dg Shoulder Left  Result Date: 08/13/2018 CLINICAL DATA:  Recent left shoulder dislocation EXAM: LEFT SHOULDER - 2+ VIEW COMPARISON:  08/11/2018 FINDINGS: Limited evaluation due to difficulty with patient positioning. Interval relocation of the left shoulder. Associated Hill-Sachs fracture of the posterolateral humeral head, unchanged. No definite fracture of the inferior glenoid rim. Visualized left lung is clear. IMPRESSION:  Interval relocation of the left shoulder. Associated Hill-Sachs fracture of the posterolateral humeral head, unchanged. No definite fracture of the inferior glenoid rim. Electronically Signed   By: Charline Bills M.D.   On: 08/13/2018 05:31    Procedures Procedures (including critical care time)  CRITICAL CARE Performed by: Shon Baton   Total critical care time: 31 minutes  Critical care time was exclusive of separately billable procedures and treating other patients.  Critical care was necessary to treat or prevent imminent or life-threatening deterioration.  Critical care was time spent personally by me on the following  activities: development of treatment plan with patient and/or surrogate as well as nursing, discussions with consultants, evaluation of patient's response to treatment, examination of patient, obtaining history from patient or surrogate, ordering and performing treatments and interventions, ordering and review of laboratory studies, ordering and review of radiographic studies, pulse oximetry and re-evaluation of patient's condition.   Medications Ordered in ED Medications  LORazepam (ATIVAN) injection 0-4 mg (1 mg Intravenous Given 08/13/18 0501)    Or  LORazepam (ATIVAN) tablet 0-4 mg ( Oral See Alternative 08/13/18 0501)  LORazepam (ATIVAN) injection 0-4 mg (has no administration in time range)    Or  LORazepam (ATIVAN) tablet 0-4 mg (has no administration in time range)  thiamine (VITAMIN B-1) tablet 100 mg (has no administration in time range)    Or  thiamine (B-1) injection 100 mg (has no administration in time range)     Initial Impression / Assessment and Plan / ED Course  I have reviewed the triage vital signs and the nursing notes.  Pertinent labs & imaging results that were available during my care of the patient were reviewed by me and considered in my medical decision making (see chart for details).        Patient presents from home with  concerns for alcohol withdrawal.  He is oriented to person and place but disoriented to sequence of events and does not know what day it is.  He really cannot tell me when he fell or what happened prior to arrival.  He is overall nontoxic-appearing.  ABCs intact.  Initial heart rate 110-120.  He is slightly tremulous.  He has evidence of his fall with trauma to the left side of the face and left arm.  Basic lab work obtained.  Patient's initial CIWA score was 5.  He was dosed Ativan.  Heart rate improved into the mid 90s.  His only complaint was left shoulder pain.  Repeat films show persistent reduction of the left shoulder.  He has no significant metabolic derangements and his EKG is reassuring.  UDS is pending.  Given history, daily drinking, and age, I feel patient is very high risk to have a poor outcome if allowed to withdraw as an outpatient.  For this reason, will admit to the hospital for close monitoring and alcohol withdrawal protocol.  Final Clinical Impressions(s) / ED Diagnoses   Final diagnoses:  Alcohol withdrawal syndrome, with delirium [facility](HCC)    ED Discharge Orders    None       Jalyssa Fleisher, Mayer Maskerourtney F, MD 08/13/18 [id number]    Shon BatonHorton, Gaylen Venning F, MD 08/13/18 [id number]    Shon BatonHorton, Trypp Heckmann F, MD 08/13/18 [phone]

## 2018-08-14 DIAGNOSIS — D696 Thrombocytopenia, unspecified: Secondary | ICD-10-CM | POA: Diagnosis not present

## 2018-08-14 DIAGNOSIS — K709 Alcoholic liver disease, unspecified: Secondary | ICD-10-CM | POA: Diagnosis present

## 2018-08-14 DIAGNOSIS — R197 Diarrhea, unspecified: Secondary | ICD-10-CM | POA: Diagnosis present

## 2018-08-14 DIAGNOSIS — F10232 Alcohol dependence with withdrawal with perceptual disturbance: Secondary | ICD-10-CM | POA: Diagnosis not present

## 2018-08-14 DIAGNOSIS — Z1159 Encounter for screening for other viral diseases: Secondary | ICD-10-CM | POA: Diagnosis not present

## 2018-08-14 DIAGNOSIS — I10 Essential (primary) hypertension: Secondary | ICD-10-CM | POA: Diagnosis present

## 2018-08-14 DIAGNOSIS — F329 Major depressive disorder, single episode, unspecified: Secondary | ICD-10-CM | POA: Diagnosis present

## 2018-08-14 DIAGNOSIS — R739 Hyperglycemia, unspecified: Secondary | ICD-10-CM | POA: Diagnosis present

## 2018-08-14 DIAGNOSIS — W19XXXD Unspecified fall, subsequent encounter: Secondary | ICD-10-CM | POA: Diagnosis not present

## 2018-08-14 DIAGNOSIS — S42292D Other displaced fracture of upper end of left humerus, subsequent encounter for fracture with routine healing: Secondary | ICD-10-CM | POA: Diagnosis not present

## 2018-08-14 DIAGNOSIS — D6959 Other secondary thrombocytopenia: Secondary | ICD-10-CM | POA: Diagnosis present

## 2018-08-14 DIAGNOSIS — F10231 Alcohol dependence with withdrawal delirium: Secondary | ICD-10-CM | POA: Diagnosis present

## 2018-08-14 DIAGNOSIS — F419 Anxiety disorder, unspecified: Secondary | ICD-10-CM | POA: Diagnosis present

## 2018-08-14 LAB — COMPREHENSIVE METABOLIC PANEL
ALT: 46 U/L — ABNORMAL HIGH (ref 0–44)
AST: 99 U/L — ABNORMAL HIGH (ref 15–41)
Albumin: 3 g/dL — ABNORMAL LOW (ref 3.5–5.0)
Alkaline Phosphatase: 91 U/L (ref 38–126)
Anion gap: 9 (ref 5–15)
BUN: 13 mg/dL (ref 8–23)
CO2: 25 mmol/L (ref 22–32)
Calcium: 8.5 mg/dL — ABNORMAL LOW (ref 8.9–10.3)
Chloride: 108 mmol/L (ref 98–111)
Creatinine, Ser: 0.73 mg/dL (ref 0.61–1.24)
GFR calc Af Amer: >60 mL/min (ref >60)
GFR calc non Af Amer: >60 mL/min (ref >60)
Glucose, Bld: 104 mg/dL — ABNORMAL HIGH (ref 70–99)
Potassium: 4 mmol/L (ref 3.5–5.1)
Sodium: 142 mmol/L (ref 135–145)
Total Bilirubin: 0.9 mg/dL (ref 0.3–1.2)
Total Protein: 5.7 g/dL — ABNORMAL LOW (ref 6.5–8.1)

## 2018-08-14 LAB — CBC
HCT: 43.3 % (ref 39.0–52.0)
Hemoglobin: 14.4 g/dL (ref 13.0–17.0)
MCH: 32.1 pg (ref 26.0–34.0)
MCHC: 33.3 g/dL (ref 30.0–36.0)
MCV: 96.4 fL (ref 80.0–100.0)
Platelets: 144 10*3/uL — ABNORMAL LOW (ref 150–400)
RBC: 4.49 MIL/uL (ref 4.22–5.81)
RDW: 13.1 % (ref 11.5–15.5)
WBC: 9.1 10*3/uL (ref 4.0–10.5)
nRBC: 0 % (ref 0.0–0.2)

## 2018-08-14 LAB — GLUCOSE, CAPILLARY
Glucose-Capillary: 89 mg/dL (ref 70–99)
Glucose-Capillary: 96 mg/dL (ref 70–99)
Glucose-Capillary: 84 mg/dL (ref 70–99)
Glucose-Capillary: 93 mg/dL (ref 70–99)

## 2018-08-14 MED ORDER — FOLIC ACID 1 MG PO TABS
1.0000 mg | ORAL_TABLET | Freq: Every day | ORAL | Status: DC
  Administered 2018-08-14 – 2018-08-18 (×5): 1 mg via ORAL
  Filled 2018-08-14 (×5): qty 1

## 2018-08-14 MED ORDER — HYDRALAZINE HCL 25 MG PO TABS
25.0000 mg | ORAL_TABLET | Freq: Four times a day (QID) | ORAL | Status: DC | PRN
  Administered 2018-08-14: 25 mg via ORAL
  Filled 2018-08-14: qty 1

## 2018-08-14 MED ORDER — HYDRALAZINE HCL 20 MG/ML IJ SOLN: 10.0000 mg | Freq: Four times a day (QID) | INTRAMUSCULAR | Status: DC | PRN

## 2018-08-14 MED ORDER — DEXTROSE-NACL 5-0.45 % IV SOLN
INTRAVENOUS | Status: DC
  Administered 2018-08-14 – 2018-08-17 (×4): via INTRAVENOUS

## 2018-08-14 NOTE — Plan of Care (Signed)
  Problem: Education: Goal: Knowledge of disease or condition will improve Outcome: Progressing Goal: Understanding of discharge needs will improve Outcome: Progressing   Problem: Health Behavior/Discharge Planning: Goal: Ability to identify changes in lifestyle to reduce recurrence of condition will improve Outcome: Progressing Goal: Identification of resources available to assist in meeting health care needs will improve Outcome: Progressing   Problem: Physical Regulation: Goal: Complications related to the disease process, condition or treatment will be avoided or minimized Outcome: Progressing   Problem: Safety: Goal: Ability to remain free from injury will improve Outcome: Progressing   Problem: Education: Goal: Knowledge of General Education information will improve Description: Including pain rating scale, medication(s)/side effects and non-pharmacologic comfort measures Outcome: Progressing   Problem: Health Behavior/Discharge Planning: Goal: Ability to manage health-related needs will improve Outcome: Progressing   Problem: Clinical Measurements: Goal: Ability to maintain clinical measurements within normal limits will improve Outcome: Progressing Goal: Will remain free from infection Outcome: Progressing Goal: Diagnostic test results will improve Outcome: Progressing Goal: Respiratory complications will improve Outcome: Progressing Goal: Cardiovascular complication will be avoided Outcome: Progressing   Problem: Activity: Goal: Risk for activity intolerance will decrease Outcome: Progressing   Problem: Nutrition: Goal: Adequate nutrition will be maintained Outcome: Progressing   Problem: Coping: Goal: Level of anxiety will decrease Outcome: Progressing   Problem: Elimination: Goal: Will not experience complications related to bowel motility Outcome: Progressing Goal: Will not experience complications related to urinary retention Outcome:  Progressing   Problem: Pain Managment: Goal: General experience of comfort will improve Outcome: Progressing   Problem: Safety: Goal: Ability to remain free from injury will improve Outcome: Progressing   Problem: Skin Integrity: Goal: Risk for impaired skin integrity will decrease Outcome: Progressing   

## 2018-08-14 NOTE — TOC Progression Note (Signed)
Transition of Care [facility]) - Progression Note    Patient Details  Name: Jon Bennett MRN: [id number] Date of Birth: [birth year]  Transition of Care [facility] CM/SW Contact  Theda Belfast Era Skeen, RN Phone Number: 08/14/2018, 1:59 PM  Clinical Narrative:     CM spoke with son Kaileb and discussed the barriers associated with inpt ETOH treatment programs for the geriatric population. Explained the patient would need to contact a program himself for enrollment. Its unlikely pt would be able to discharge directly into program. CM provided both brothers with a list of resources highlighting potential options for a medicare patient. Son receptive to barriers and plans to reach out to places to determine eligibility. Pt still confused today, unable to discuss readiness to stop with him directly. Per son pt has been to AA meetings in the past and has gone through withdrawals two other times. TOC will cont to follow.

## 2018-08-14 NOTE — Progress Notes (Signed)
RN paged MD regarding patient having BP of 169/105 during CIWA check, patient given Ativan 2 mg IV, BP now 153/104, awaiting response.  P.J. Henderson Newcomer, RN

## 2018-08-14 NOTE — Care Management (Signed)
Per RN family son wishes to discuss ETOH detox and treatment programs. CM has left HIPPA compliant VM with son Torao.

## 2018-08-14 NOTE — Progress Notes (Signed)
Patient Demographics:    Jon Bennett, is a 84 y.o. male, DOB - 1935/01/16, AVW:[id number]  Admit date - 08/13/2018   Admitting Physician Meredeth Ide, MD  Outpatient Primary MD for the patient is [name], MD  LOS - 0   Chief Complaint  Patient presents with   Altered Mental Status        Subjective:    [name] today has no fevers, no emesis,  No chest pain, patient remains confused disoriented requiring benzos for sedation due to alcohol withdrawal  Assessment  & Plan :    Active Problems:   Alcohol withdrawal [facility])  Brief summary 84 y.o. male, with history of diabetes mellitus type 2, hypertension, recent fall with resultant left shoulder dislocation readmitted on 08/13/2018 with alcohol withdrawal symptoms  A/p  1) alcohol withdrawal ---confusion, disorientation agitation persist, improved after administration of benzos--Continue benzo per CIWA protocol,  continue multivitamin   2)H/o DM--- recent A1c 5.2, no need for oral hypoglycemics, checks as patient is at risk for hypoglycemia due to poor oral intake as a result of agitation alternating with lethargy in the setting of alcohol withdrawal requiring benzodiazepine for sedation--- we will change IV fluids to dextrose until oral intake is more reliable  3) patient left shoulder dislocation--- continue to use sling, no definite fracture on imaging studies  Disposition/Need for in-Hospital Stay- patient unable to be discharged at this time due to alcohol withdrawal requiring IV benzodiazepine for sedation  Code Status : Full   Family Communication:   son   Disposition Plan  : TBD  Consults  :  na  DVT Prophylaxis  :    Heparin - SCDs   Lab Results  Component Value Date   PLT 144 (L) 08/14/2018    Inpatient Medications  Scheduled Meds:  insulin aspart  0-9 Units Subcutaneous TID WC   LORazepam  0-4 mg Intravenous  Q6H   Or   LORazepam  0-4 mg Oral Q6H   [START ON 08/15/2018] LORazepam  0-4 mg Intravenous Q12H   Or   [START ON 08/15/2018] LORazepam  0-4 mg Oral Q12H   thiamine  100 mg Oral Daily   Or   thiamine  100 mg Intravenous Daily   Continuous Infusions:  sodium chloride 75 mL/hr at 08/13/18 2153   PRN Meds:.acetaminophen **OR** acetaminophen, hydrALAZINE, ondansetron **OR** ondansetron (ZOFRAN) IV    Anti-infectives (From admission, onward)   None        Objective:   Vitals:   08/14/18 0110 08/14/18 0355 08/14/18 0600 08/14/18 1325  BP: (!) 153/104 (!) 160/98 (!) 139/94 (!) 151/82  Pulse: 92 (!) 105 100 98  Resp:   18 16  Temp:   98.1 F (36.7 C)   TempSrc:   Oral   SpO2:   96% 96%  Weight:      Height:        Wt Readings from Last 3 Encounters:  08/13/18 71.5 kg  08/10/18 78 kg     Intake/Output Summary (Last 24 hours) at 08/14/2018 1917 Last data filed at 08/14/2018 1844 Gross per 24 hour  Intake 240 ml  Output 800 ml  Net -560 ml     Physical Exam Patient is examined daily including today  on 08/13/28 , exams remain the same as of yesterday except that has changed   Gen:-Sleepy post benzos HEENT:- [place].AT, No sclera icterus Neck-Supple Neck,No JVD,.  Lungs-  CTAB , fair symmetrical air movement CV- S1, S2 normal, regular  Abd-  +ve B.Sounds, Abd Soft, No tenderness,    Extremity/Skin:- No  edema, pedal pulses present  Psych-episodes of agitation and disorientation alternates with episodes of sedation after benzo use  neuro-generalized weakness ,no new focal deficits, some tremors tremors   Data Review:   Micro Results Recent Results (from the past 240 hour(s))  SARS Coronavirus 2 (CEPHEID - Performed in [facility] hospital lab), Hosp Order     Status: None   Collection Time: 08/13/18  6:07 AM  Result Value Ref Range Status   SARS Coronavirus 2 NEGATIVE NEGATIVE Final    Comment: (NOTE) If result is NEGATIVE SARS-CoV-2 target nucleic acids are NOT  DETECTED. The SARS-CoV-2 RNA is generally detectable in upper and lower  respiratory specimens during the acute phase of infection. The lowest  concentration of SARS-CoV-2 viral copies this assay can detect is 250  copies / mL. A negative result does not preclude SARS-CoV-2 infection  and should not be used as the sole basis for treatment or other  patient management decisions.  A negative result may occur with  improper specimen collection / handling, submission of specimen other  than nasopharyngeal swab, presence of viral mutation(s) within the  areas targeted by this assay, and inadequate number of viral copies  (<250 copies / mL). A negative result must be combined with clinical  observations, patient history, and epidemiological information. If result is POSITIVE SARS-CoV-2 target nucleic acids are DETECTED. The SARS-CoV-2 RNA is generally detectable in upper and lower  respiratory specimens dur ing the acute phase of infection.  Positive  results are indicative of active infection with SARS-CoV-2.  Clinical  correlation with patient history and other diagnostic information is  necessary to determine patient infection status.  Positive results do  not rule out bacterial infection or co-infection with other viruses. If result is PRESUMPTIVE POSTIVE SARS-CoV-2 nucleic acids MAY BE PRESENT.   A presumptive positive result was obtained on the submitted specimen  and confirmed on repeat testing.  While 2019 novel coronavirus  (SARS-CoV-2) nucleic acids may be present in the submitted sample  additional confirmatory testing may be necessary for epidemiological  and / or clinical management purposes  to differentiate between  SARS-CoV-2 and other Sarbecovirus currently known to infect humans.  If clinically indicated additional testing with an alternate test  methodology [phone]) is advised. The SARS-CoV-2 RNA is generally  detectable in upper and lower respiratory sp ecimens during  the acute  phase of infection. The expected result is Negative. Fact Sheet for Patients:  BoilerBrush.com.cy Fact Sheet for Healthcare Providers: https://pope.com/ This test is not yet approved or cleared by the [place] FDA and has been authorized for detection and/or diagnosis of SARS-CoV-2 by FDA under an Emergency Use Authorization (EUA).  This EUA will remain in effect (meaning this test can be used) for the duration of the COVID-19 declaration under Section 564(b)(1) of the Act, 21 U.S.C. section 360bbb-3(b)(1), unless the authorization is terminated or revoked sooner. Performed at [facility], [address], [place], [place] [address]     Radiology Reports Dg Elbow Complete Left  Result Date: 08/11/2018 CLINICAL DATA:  Recent fall with left-sided elbow pain, initial encounter EXAM: LEFT ELBOW - COMPLETE 3+ VIEW COMPARISON:  None. FINDINGS:  Mild osteophytic changes are noted from the olecranon. No acute fracture or dislocation is noted. IMPRESSION: Mild degenerative change without acute abnormality. Electronically Signed   By: Alcide Clever M.D.   On: 08/11/2018 01:53   Ct Head Wo Contrast  Result Date: 08/11/2018 CLINICAL DATA:  Recent fall EXAM: CT HEAD WITHOUT CONTRAST CT CERVICAL SPINE WITHOUT CONTRAST TECHNIQUE: Multidetector CT imaging of the head and cervical spine was performed following the standard protocol without intravenous contrast. Multiplanar CT image reconstructions of the cervical spine were also generated. COMPARISON:  None. FINDINGS: CT HEAD FINDINGS Brain: Mild atrophic changes are noted. Chronic white matter ischemic changes seen. No findings to suggest acute hemorrhage, acute infarction or space-occupying mass lesion are noted. Vascular: No hyperdense vessel or unexpected calcification. Skull: Normal. Negative for fracture or focal lesion. Sinuses/Orbits: No acute finding. Other: Mild soft tissue swelling is  noted in the left supraorbital region. CT CERVICAL SPINE FINDINGS Alignment: Within normal limits. Skull base and vertebrae: 7 cervical segments are well visualized. Vertebral body height is well maintained. Mild osteophytic changes noted. Facet hypertrophic changes are noted. No acute fracture or acute facet abnormality is noted. Soft tissues and spinal canal: Surrounding soft tissue structures show no acute abnormality. Upper chest: Lung apices are within normal limits. Other: None IMPRESSION: CT of the head: Chronic atrophic and ischemic changes. Left supraorbital soft tissue swelling consistent with the recent injury. CT of the cervical spine: Multilevel degenerative change without acute abnormality. Electronically Signed   By: Alcide Clever M.D.   On: 08/11/2018 01:49   Ct Cervical Spine Wo Contrast  Result Date: 08/11/2018 CLINICAL DATA:  Recent fall EXAM: CT HEAD WITHOUT CONTRAST CT CERVICAL SPINE WITHOUT CONTRAST TECHNIQUE: Multidetector CT imaging of the head and cervical spine was performed following the standard protocol without intravenous contrast. Multiplanar CT image reconstructions of the cervical spine were also generated. COMPARISON:  None. FINDINGS: CT HEAD FINDINGS Brain: Mild atrophic changes are noted. Chronic white matter ischemic changes seen. No findings to suggest acute hemorrhage, acute infarction or space-occupying mass lesion are noted. Vascular: No hyperdense vessel or unexpected calcification. Skull: Normal. Negative for fracture or focal lesion. Sinuses/Orbits: No acute finding. Other: Mild soft tissue swelling is noted in the left supraorbital region. CT CERVICAL SPINE FINDINGS Alignment: Within normal limits. Skull base and vertebrae: 7 cervical segments are well visualized. Vertebral body height is well maintained. Mild osteophytic changes noted. Facet hypertrophic changes are noted. No acute fracture or acute facet abnormality is noted. Soft tissues and spinal canal:  Surrounding soft tissue structures show no acute abnormality. Upper chest: Lung apices are within normal limits. Other: None IMPRESSION: CT of the head: Chronic atrophic and ischemic changes. Left supraorbital soft tissue swelling consistent with the recent injury. CT of the cervical spine: Multilevel degenerative change without acute abnormality. Electronically Signed   By: Alcide Clever M.D.   On: 08/11/2018 01:49   Dg Shoulder Left  Result Date: 08/13/2018 CLINICAL DATA:  Recent left shoulder dislocation EXAM: LEFT SHOULDER - 2+ VIEW COMPARISON:  08/11/2018 FINDINGS: Limited evaluation due to difficulty with patient positioning. Interval relocation of the left shoulder. Associated Hill-Sachs fracture of the posterolateral humeral head, unchanged. No definite fracture of the inferior glenoid rim. Visualized left lung is clear. IMPRESSION: Interval relocation of the left shoulder. Associated Hill-Sachs fracture of the posterolateral humeral head, unchanged. No definite fracture of the inferior glenoid rim. Electronically Signed   By: Charline Bills M.D.   On: 08/13/2018 05:31   Dg  Shoulder Left  Result Date: 08/11/2018 CLINICAL DATA:  Recent fall with shoulder pain, initial encounter EXAM: LEFT SHOULDER - 2+ VIEW COMPARISON:  None. FINDINGS: Anterior inferior dislocation of the humeral head is noted with respect to the glenoid. Some fragmentation of the glenoid is seen. There are changes suspicious for an avulsion from the greater tuberosity. Degenerative changes of the acromioclavicular joint are seen IMPRESSION: Anterior inferior dislocation of the humeral head. There are changes consistent with glenoid fracture as well as findings suspicious for a greater tuberosity fracture from the. Electronically Signed   By: Alcide CleverMark  Lukens M.D.   On: 08/11/2018 01:54     CBC Recent Labs  Lab 08/13/18 0452 08/14/18 0506  WBC 11.2* 9.1  HGB 15.5 14.4  HCT 45.0 43.3  PLT 154 144*  MCV 94.1 96.4  MCH 32.4  32.1  MCHC 34.4 33.3  RDW 12.8 13.1  LYMPHSABS 1.3  --   MONOABS 0.9  --   EOSABS 0.1  --   BASOSABS 0.0  --     Chemistries  Recent Labs  Lab 08/13/18 0452 08/14/18 0506  NA 143 142  K 3.5 4.0  CL 107 108  CO2 24 25  GLUCOSE 133* 104*  BUN 14 13  CREATININE 0.93 0.73  CALCIUM 9.1 8.5*  AST  --  99*  ALT  --  46*  ALKPHOS  --  91  BILITOT  --  0.9   ------------------------------------------------------------------------------------------------------------------ No results for input(s): CHOL, HDL, LDLCALC, TRIG, CHOLHDL, LDLDIRECT in the last 72 hours.  Lab Results  Component Value Date   HGBA1C 5.2 08/13/2018   ------------------------------------------------------------------------------------------------------------------ No results for input(s): TSH, T4TOTAL, T3FREE, THYROIDAB in the last 72 hours.  Invalid input(s): FREET3 ------------------------------------------------------------------------------------------------------------------ No results for input(s): VITAMINB12, FOLATE, FERRITIN, TIBC, IRON, RETICCTPCT in the last 72 hours.  Coagulation profile No results for input(s): INR, PROTIME in the last 168 hours.  No results for input(s): DDIMER in the last 72 hours.  Cardiac Enzymes No results for input(s): CKMB, TROPONINI, MYOGLOBIN in the last 168 hours.  Invalid input(s): CK ------------------------------------------------------------------------------------------------------------------ No results found for: BNP   Shon Haleourage Dashley Monts M.D on 08/14/2018 at 7:17 PM  Go to [email or web address] - for contact info  [facility] - Office  [phone]

## 2018-08-15 LAB — GLUCOSE, CAPILLARY
Glucose-Capillary: 120 mg/dL — ABNORMAL HIGH (ref 70–99)
Glucose-Capillary: 125 mg/dL — ABNORMAL HIGH (ref 70–99)
Glucose-Capillary: 108 mg/dL — ABNORMAL HIGH (ref 70–99)
Glucose-Capillary: 122 mg/dL — ABNORMAL HIGH (ref 70–99)

## 2018-08-15 MED ORDER — METOPROLOL TARTRATE 25 MG PO TABS
25.0000 mg | ORAL_TABLET | Freq: Three times a day (TID) | ORAL | Status: DC
  Administered 2018-08-15 – 2018-08-17 (×6): 25 mg via ORAL
  Filled 2018-08-15 (×5): qty 1

## 2018-08-15 MED ORDER — HEPARIN SODIUM (PORCINE) 5000 UNIT/ML IJ SOLN
5000.0000 [IU] | Freq: Three times a day (TID) | INTRAMUSCULAR | Status: DC
  Administered 2018-08-15 – 2018-08-18 (×9): 5000 [IU] via SUBCUTANEOUS
  Filled 2018-08-15 (×9): qty 1

## 2018-08-15 NOTE — Progress Notes (Signed)
Patient Demographics:    Jon Bennett, is a 84 y.o. male, DOB - February 01, 1935, ZOX:[id number]  Admit date - 08/13/2018   Admitting Physician Meredeth Ide, MD  Outpatient Primary MD for the patient is [name], MD  LOS - 1  Chief Complaint  Patient presents with  . Altered Mental Status        Subjective:    Jon Bennett today has no fevers, no emesis,  No chest pain, patient remains confused disoriented requiring benzos for sedation due to alcohol withdrawal-- poor oral intake due to delirium tremens requiring IV fluids to prevent acute kidney injury  Assessment  & Plan :    Active Problems:   Alcohol withdrawal [facility])  Brief summary 84 y.o. male, with history of diabetes mellitus type 2, hypertension, recent fall with resultant left shoulder dislocation readmitted on 08/13/2018 with alcohol withdrawal symptoms-- Full blown DTs  A/p  1) alcohol withdrawal ---confusion, disorientation agitation persist, improved after administration of benzos--Continue benzo per CIWA protocol,  continue multivitamin, poor oral intake due to delirium tremens requiring IV fluids to prevent acute kidney injury  2)H/o DM--- recent A1c 5.2, no need for oral hypoglycemics, checks as patient is at risk for hypoglycemia due to poor oral intake as a result of agitation alternating with lethargy in the setting of alcohol withdrawal requiring benzodiazepine for sedation--- we will change IV fluids to dextrose until oral intake is more reliable  3)Patient Left shoulder Dislocation--- continue to use sling, no definite fracture on imaging studies  Disposition/Need for in-Hospital Stay- patient unable to be discharged at this time due to alcohol withdrawal requiring IV benzodiazepine for sedation, poor oral intake due to delirium tremens requiring IV fluids to prevent acute kidney injury  Code Status : Full   Family  Communication:   son  Disposition Plan  : TBD  Consults  :  na  DVT Prophylaxis  :    Heparin - SCDs   Lab Results  Component Value Date   PLT 144 (L) 08/14/2018    Inpatient Medications  Scheduled Meds: . folic acid  1 mg Oral Daily  . insulin aspart  0-9 Units Subcutaneous TID WC  . LORazepam  0-4 mg Intravenous Q12H   Or  . LORazepam  0-4 mg Oral Q12H  . metoprolol tartrate  25 mg Oral Q8H  . thiamine  100 mg Oral Daily   Or  . thiamine  100 mg Intravenous Daily   Continuous Infusions: . dextrose 5 % and 0.45% NaCl 50 mL/hr at 08/15/18 1404   PRN Meds:.acetaminophen **OR** acetaminophen, hydrALAZINE, ondansetron **OR** ondansetron (ZOFRAN) IV   Anti-infectives (From admission, onward)   None        Objective:   Vitals:   08/15/18 0004 08/15/18 0535 08/15/18 1946 08/15/18 1949  BP: (!) 168/96 (!) 148/93 (!) 164/98   Pulse: 98 (!) 105 (!) 112   Resp: Temp: (!) 97.4 F (36.3 C) 98.1 F (36.7 C) 98.4 F (36.9 C)   TempSrc: Oral Oral Oral   SpO2: 97% 94% 95% 94%  Weight:      Height:        Wt Readings from Last 3 Encounters:  08/13/18 71.5 kg  08/10/18 78  kg     Intake/Output Summary (Last 24 hours) at 08/15/2018 1958 Last data filed at 08/15/2018 1800 Gross per 24 hour  Intake 1030 ml  Output 1200 ml  Net -170 ml    Physical Exam Patient is examined daily including today on 08/14/28 , exams remain the same as of yesterday except that has changed   Gen:- awake, agitated at times HEENT:- [place].AT, No sclera icterus Neck-Supple Neck,No JVD,.  Lungs-  CTAB , fair symmetrical air movement CV- S1, S2 normal, regular  Abd-  +ve B.Sounds, Abd Soft, No tenderness,    Extremity/Skin:- No  edema, pedal pulses present  Psych-episodes of agitation and disorientation alternates with episodes of sedation after benzo use  neuro-generalized weakness ,no new focal deficits, some tremors tremors   Data Review:   Micro Results Recent Results (from the  past 240 hour(s))  SARS Coronavirus 2 (CEPHEID - Performed in [facility] hospital lab), Hosp Order     Status: None   Collection Time: 08/13/18  6:07 AM  Result Value Ref Range Status   SARS Coronavirus 2 NEGATIVE NEGATIVE Final    Comment: (NOTE) If result is NEGATIVE SARS-CoV-2 target nucleic acids are NOT DETECTED. The SARS-CoV-2 RNA is generally detectable in upper and lower  respiratory specimens during the acute phase of infection. The lowest  concentration of SARS-CoV-2 viral copies this assay can detect is 250  copies / mL. A negative result does not preclude SARS-CoV-2 infection  and should not be used as the sole basis for treatment or other  patient management decisions.  A negative result may occur with  improper specimen collection / handling, submission of specimen other  than nasopharyngeal swab, presence of viral mutation(s) within the  areas targeted by this assay, and inadequate number of viral copies  (<250 copies / mL). A negative result must be combined with clinical  observations, patient history, and epidemiological information. If result is POSITIVE SARS-CoV-2 target nucleic acids are DETECTED. The SARS-CoV-2 RNA is generally detectable in upper and lower  respiratory specimens dur ing the acute phase of infection.  Positive  results are indicative of active infection with SARS-CoV-2.  Clinical  correlation with patient history and other diagnostic information is  necessary to determine patient infection status.  Positive results do  not rule out bacterial infection or co-infection with other viruses. If result is PRESUMPTIVE POSTIVE SARS-CoV-2 nucleic acids MAY BE PRESENT.   A presumptive positive result was obtained on the submitted specimen  and confirmed on repeat testing.  While 2019 novel coronavirus  (SARS-CoV-2) nucleic acids may be present in the submitted sample  additional confirmatory testing may be necessary for epidemiological  and / or  clinical management purposes  to differentiate between  SARS-CoV-2 and other Sarbecovirus currently known to infect humans.  If clinically indicated additional testing with an alternate test  methodology [phone]) is advised. The SARS-CoV-2 RNA is generally  detectable in upper and lower respiratory sp ecimens during the acute  phase of infection. The expected result is Negative. Fact Sheet for Patients:  BoilerBrush.com.cy Fact Sheet for Healthcare Providers: https://pope.com/ This test is not yet approved or cleared by the [place] FDA and has been authorized for detection and/or diagnosis of SARS-CoV-2 by FDA under an Emergency Use Authorization (EUA).  This EUA will remain in effect (meaning this test can be used) for the duration of the COVID-19 declaration under Section 564(b)(1) of the Act, 21 U.S.C. section 360bbb-3(b)(1), unless the authorization is terminated or revoked  sooner. Performed at [facility], [address] [place], [place] [address]     Radiology Reports Dg Elbow Complete Left  Result Date: 08/11/2018 CLINICAL DATA:  Recent fall with left-sided elbow pain, initial encounter EXAM: LEFT ELBOW - COMPLETE 3+ VIEW COMPARISON:  None. FINDINGS: Mild osteophytic changes are noted from the olecranon. No acute fracture or dislocation is noted. IMPRESSION: Mild degenerative change without acute abnormality. Electronically Signed   By: Alcide Clever M.D.   On: 08/11/2018 01:53   Ct Head Wo Contrast  Result Date: 08/11/2018 CLINICAL DATA:  Recent fall EXAM: CT HEAD WITHOUT CONTRAST CT CERVICAL SPINE WITHOUT CONTRAST TECHNIQUE: Multidetector CT imaging of the head and cervical spine was performed following the standard protocol without intravenous contrast. Multiplanar CT image reconstructions of the cervical spine were also generated. COMPARISON:  None. FINDINGS: CT HEAD FINDINGS Brain: Mild atrophic changes are noted. Chronic  white matter ischemic changes seen. No findings to suggest acute hemorrhage, acute infarction or space-occupying mass lesion are noted. Vascular: No hyperdense vessel or unexpected calcification. Skull: Normal. Negative for fracture or focal lesion. Sinuses/Orbits: No acute finding. Other: Mild soft tissue swelling is noted in the left supraorbital region. CT CERVICAL SPINE FINDINGS Alignment: Within normal limits. Skull base and vertebrae: 7 cervical segments are well visualized. Vertebral body height is well maintained. Mild osteophytic changes noted. Facet hypertrophic changes are noted. No acute fracture or acute facet abnormality is noted. Soft tissues and spinal canal: Surrounding soft tissue structures show no acute abnormality. Upper chest: Lung apices are within normal limits. Other: None IMPRESSION: CT of the head: Chronic atrophic and ischemic changes. Left supraorbital soft tissue swelling consistent with the recent injury. CT of the cervical spine: Multilevel degenerative change without acute abnormality. Electronically Signed   By: Alcide Clever M.D.   On: 08/11/2018 01:49   Ct Cervical Spine Wo Contrast  Result Date: 08/11/2018 CLINICAL DATA:  Recent fall EXAM: CT HEAD WITHOUT CONTRAST CT CERVICAL SPINE WITHOUT CONTRAST TECHNIQUE: Multidetector CT imaging of the head and cervical spine was performed following the standard protocol without intravenous contrast. Multiplanar CT image reconstructions of the cervical spine were also generated. COMPARISON:  None. FINDINGS: CT HEAD FINDINGS Brain: Mild atrophic changes are noted. Chronic white matter ischemic changes seen. No findings to suggest acute hemorrhage, acute infarction or space-occupying mass lesion are noted. Vascular: No hyperdense vessel or unexpected calcification. Skull: Normal. Negative for fracture or focal lesion. Sinuses/Orbits: No acute finding. Other: Mild soft tissue swelling is noted in the left supraorbital region. CT CERVICAL  SPINE FINDINGS Alignment: Within normal limits. Skull base and vertebrae: 7 cervical segments are well visualized. Vertebral body height is well maintained. Mild osteophytic changes noted. Facet hypertrophic changes are noted. No acute fracture or acute facet abnormality is noted. Soft tissues and spinal canal: Surrounding soft tissue structures show no acute abnormality. Upper chest: Lung apices are within normal limits. Other: None IMPRESSION: CT of the head: Chronic atrophic and ischemic changes. Left supraorbital soft tissue swelling consistent with the recent injury. CT of the cervical spine: Multilevel degenerative change without acute abnormality. Electronically Signed   By: Alcide Clever M.D.   On: 08/11/2018 01:49   Dg Shoulder Left  Result Date: 08/13/2018 CLINICAL DATA:  Recent left shoulder dislocation EXAM: LEFT SHOULDER - 2+ VIEW COMPARISON:  08/11/2018 FINDINGS: Limited evaluation due to difficulty with patient positioning. Interval relocation of the left shoulder. [place]-Sachs fracture of the posterolateral humeral head, unchanged. No definite fracture of the inferior  glenoid rim. Visualized left lung is clear. IMPRESSION: Interval relocation of the left shoulder. Associated Hill-Sachs fracture of the posterolateral humeral head, unchanged. No definite fracture of the inferior glenoid rim. Electronically Signed   By: Charline BillsSriyesh  Krishnan M.D.   On: 08/13/2018 05:31   Dg Shoulder Left  Result Date: 08/11/2018 CLINICAL DATA:  Recent fall with shoulder pain, initial encounter EXAM: LEFT SHOULDER - 2+ VIEW COMPARISON:  None. FINDINGS: Anterior inferior dislocation of the humeral head is noted with respect to the glenoid. Some fragmentation of the glenoid is seen. There are changes suspicious for an avulsion from the greater tuberosity. Degenerative changes of the acromioclavicular joint are seen IMPRESSION: Anterior inferior dislocation of the humeral head. There are changes consistent with  glenoid fracture as well as findings suspicious for a greater tuberosity fracture from the. Electronically Signed   By: Alcide CleverMark  Lukens M.D.   On: 08/11/2018 01:54     CBC Recent Labs  Lab 08/13/18 0452 08/14/18 0506  WBC 11.2* 9.1  HGB 15.5 14.4  HCT 45.0 43.3  PLT 154 144*  MCV 94.1 96.4  MCH 32.4 32.1  MCHC 34.4 33.3  RDW 12.8 13.1  LYMPHSABS 1.3  --   MONOABS 0.9  --   EOSABS 0.1  --   BASOSABS 0.0  --     Chemistries  Recent Labs  Lab 08/13/18 0452 08/14/18 0506  NA 143 142  K 3.5 4.0  CL 107 108  CO2 24 25  GLUCOSE 133* 104*  BUN 14 13  CREATININE 0.93 0.73  CALCIUM 9.1 8.5*  AST  --  99*  ALT  --  46*  ALKPHOS  --  91  BILITOT  --  0.9   ------------------------------------------------------------------------------------------------------------------ No results for input(s): CHOL, HDL, LDLCALC, TRIG, CHOLHDL, LDLDIRECT in the last 72 hours.  Lab Results  Component Value Date   HGBA1C 5.2 08/13/2018   ------------------------------------------------------------------------------------------------------------------ No results for input(s): TSH, T4TOTAL, T3FREE, THYROIDAB in the last 72 hours.  Invalid input(s): FREET3 ------------------------------------------------------------------------------------------------------------------ No results for input(s): VITAMINB12, FOLATE, FERRITIN, TIBC, IRON, RETICCTPCT in the last 72 hours.  Coagulation profile No results for input(s): INR, PROTIME in the last 168 hours.  No results for input(s): DDIMER in the last 72 hours.  Cardiac Enzymes No results for input(s): CKMB, TROPONINI, MYOGLOBIN in the last 168 hours.  Invalid input(s): CK ------------------------------------------------------------------------------------------------------------------ No results found for: BNP   Shon Haleourage Algis Lehenbauer M.D on 08/15/2018 at 7:58 PM  Go to [email or web address] - for contact info  [facility] - Office  [phone]

## 2018-08-15 NOTE — Progress Notes (Signed)
Pt has attempted numerous times to get OOB. [facility]X3 but not to situation. Wanting to walk outside on the sidewalk. Low bed ordered/floor mats/bed alarm is use. Ativan given per CIWA. Safety sitter ordered. Telesitter not appropriate.

## 2018-08-16 DIAGNOSIS — D696 Thrombocytopenia, unspecified: Secondary | ICD-10-CM

## 2018-08-16 DIAGNOSIS — F10231 Alcohol dependence with withdrawal delirium: Principal | ICD-10-CM

## 2018-08-16 LAB — BASIC METABOLIC PANEL
Anion gap: 11 (ref 5–15)
BUN: 11 mg/dL (ref 8–23)
CO2: 23 mmol/L (ref 22–32)
Calcium: 9 mg/dL (ref 8.9–10.3)
Chloride: 103 mmol/L (ref 98–111)
Creatinine, Ser: 0.63 mg/dL (ref 0.61–1.24)
GFR calc Af Amer: >60 mL/min (ref >60)
GFR calc non Af Amer: >60 mL/min (ref >60)
Glucose, Bld: 131 mg/dL — ABNORMAL HIGH (ref 70–99)
Potassium: 3.6 mmol/L (ref 3.5–5.1)
Sodium: 137 mmol/L (ref 135–145)

## 2018-08-16 LAB — CBC
HCT: 42.3 % (ref 39.0–52.0)
Hemoglobin: 14.7 g/dL (ref 13.0–17.0)
MCH: 32 pg (ref 26.0–34.0)
MCHC: 34.8 g/dL (ref 30.0–36.0)
MCV: 92 fL (ref 80.0–100.0)
Platelets: 164 10*3/uL (ref 150–400)
RBC: 4.6 MIL/uL (ref 4.22–5.81)
RDW: 12.6 % (ref 11.5–15.5)
WBC: 11.1 10*3/uL — ABNORMAL HIGH (ref 4.0–10.5)
nRBC: 0 % (ref 0.0–0.2)

## 2018-08-16 LAB — GLUCOSE, CAPILLARY
Glucose-Capillary: 117 mg/dL — ABNORMAL HIGH (ref 70–99)
Glucose-Capillary: 128 mg/dL — ABNORMAL HIGH (ref 70–99)
Glucose-Capillary: 130 mg/dL — ABNORMAL HIGH (ref 70–99)

## 2018-08-16 MED ORDER — ALUM & MAG HYDROXIDE-SIMETH 200-200-20 MG/5ML PO SUSP
30.0000 mL | ORAL | Status: DC | PRN
  Administered 2018-08-16 – 2018-08-17 (×2): 30 mL via ORAL
  Filled 2018-08-16 (×2): qty 30

## 2018-08-16 NOTE — Care Management Important Message (Signed)
Important Message  Patient Details  Name: Jon Bennett MRN: [id number] Date of Birth: May 03, 1934   Medicare Important Message Given:  Yes    Corey Harold 08/16/2018, 2:12 PM

## 2018-08-16 NOTE — Progress Notes (Addendum)
PROGRESS NOTE  Jon Bennett WUJ:[id number] DOB: 1934-03-26 DOA: 08/13/2018 PCP: [name], MD  Brief History:  84 year old male with a history of impaired glucose tolerance, hypertension, alcoholic pancreatitis, and alcohol dependence presenting with altered mental status.  Patient lives with his son.  Son reports that he has not had any alcohol since Thursday 08/10/2018.  The son reported that the patient was tremulous and confused on the day of admission.In the emergency department, the patient was afebrile hemodynamically stable saturating 97% on room air.  Patient was admitted for alcohol withdrawal and started on alcohol withdrawal protocol.  Assessment/Plan: Alcohol withdrawal -Continue alcohol withdrawal protocol -The patient remains confused although less agitated today -Continue IV fluids -Check folic acid -Check serum B12 -Hepatitis B surface antigen -Hepatitis C antibody  Essential hypertension -Patient has not had any antihypertensive medications -Monitor clinically -continue metoprolol tartrate  Hyperglycemia -Likely stress-induced -08/13/2018 hemoglobin A1c 5.2 -Discontinue NovoLog sliding scale  Thrombocytopenia -due to chronic Etoh use and Etoh liver disease -B12 -folic acid      Disposition Plan:   Home in 1-2 days  Family Communication:   Family at bedside  Consultants:    Code Status:  FULL / DNR  DVT Prophylaxis:  [place] Heparin / [place] Lovenox   Procedures: As Listed in Progress Note Above  Antibiotics: None       Subjective: Patient is somnolent, but he is arousable.  He answers simple questions.  He denies any headache, chest pain, shortness breath, abdominal pain.  He denies any fevers, chills.  Objective: Vitals:   08/15/18 2214 08/16/18 0057 08/16/18 0534 08/16/18 1648  BP: (!) 157/94 (!) 155/95 (!) 151/88 129/77  Pulse: (!) 120 (!) 101 86 68  Resp: Temp: 98.2 F (36.8 C) 98.6 F (37 C) 98.5 F (36.9 C)  98.6 F (37 C)  TempSrc: Oral Oral Oral Oral  SpO2: 96% 97%  98%  Weight:      Height:        Intake/Output Summary (Last 24 hours) at 08/16/2018 1726 Last data filed at 08/16/2018 1400 Gross per 24 hour  Intake 1065.14 ml  Output 950 ml  Net 115.14 ml   Weight change:  Exam:   General:  Pt is alert, follows commands appropriately, not in acute distress  HEENT: No icterus, No thrush, No neck mass, [place]/AT  Cardiovascular: RRR, S1/S2, no rubs, no gallops  Respiratory: Diminished breath sounds at the bases.  No wheezing.  Abdomen: Soft/+BS, non tender, non distended, no guarding  Extremities: No edema, No lymphangitis, No petechiae, No rashes, no synovitis   Data Reviewed: I have personally reviewed following labs and imaging studies Basic Metabolic Panel: Recent Labs  Lab 08/13/18 0452 08/14/18 0506 08/16/18 0518  NA 143 142 137  K 3.5 4.0 3.6  CL 107 108 103  CO2 GLUCOSE 133* 104* 131*  BUN CREATININE 0.93 0.73 0.63  CALCIUM 9.1 8.5* 9.0   Liver Function Tests: Recent Labs  Lab 08/14/18 0506  AST 99*  ALT 46*  ALKPHOS 91  BILITOT 0.9  PROT 5.7*  ALBUMIN 3.0*   No results for input(s): LIPASE, AMYLASE in the last 168 hours. No results for input(s): AMMONIA in the last 168 hours. Coagulation Profile: No results for input(s): INR, PROTIME in the last 168 hours. CBC: Recent Labs  Lab 08/13/18 0452 08/14/18 0506 08/16/18 0518  WBC 11.2* 9.1 11.1*  NEUTROABS 8.8*  --   --   HGB 15.5 14.4 14.7  HCT 45.0 43.3 42.3  MCV 94.1 96.4 92.0  PLT 154 144* 164   Cardiac Enzymes: No results for input(s): CKTOTAL, CKMB, CKMBINDEX, TROPONINI in the last 168 hours. BNP: Invalid input(s): POCBNP CBG: Recent Labs  Lab 08/15/18 1623 08/15/18 2213 08/16/18 0728 08/16/18 1107 08/16/18 1624  GLUCAP 108* 122* 117* 128* 130*   HbA1C: No results for input(s): HGBA1C in the last 72 hours. Urine analysis:    Component Value Date/Time    COLORURINE YELLOW 08/13/2018 1735   APPEARANCEUR CLEAR 08/13/2018 1735   LABSPEC 1.021 08/13/2018 1735   PHURINE 5.0 08/13/2018 1735   GLUCOSEU NEGATIVE 08/13/2018 1735   HGBUR LARGE (A) 08/13/2018 1735   BILIRUBINUR NEGATIVE 08/13/2018 1735   KETONESUR 20 (A) 08/13/2018 1735   PROTEINUR NEGATIVE 08/13/2018 1735   NITRITE POSITIVE (A) 08/13/2018 1735   LEUKOCYTESUR SMALL (A) 08/13/2018 1735   Sepsis Labs: @LABRCNTIP (procalcitonin:4,lacticidven:4) ) Recent Results (from the past 240 hour(s))  SARS Coronavirus 2 (CEPHEID - Performed in [facility] hospital lab), Hosp Order     Status: None   Collection Time: 08/13/18  6:07 AM  Result Value Ref Range Status   SARS Coronavirus 2 NEGATIVE NEGATIVE Final    Comment: (NOTE) If result is NEGATIVE SARS-CoV-2 target nucleic acids are NOT DETECTED. The SARS-CoV-2 RNA is generally detectable in upper and lower  respiratory specimens during the acute phase of infection. The lowest  concentration of SARS-CoV-2 viral copies this assay can detect is 250  copies / mL. A negative result does not preclude SARS-CoV-2 infection  and should not be used as the sole basis for treatment or other  patient management decisions.  A negative result may occur with  improper specimen collection / handling, submission of specimen other  than nasopharyngeal swab, presence of viral mutation(s) within the  areas targeted by this assay, and inadequate number of viral copies  (<250 copies / mL). A negative result must be combined with clinical  observations, patient history, and epidemiological information. If result is POSITIVE SARS-CoV-2 target nucleic acids are DETECTED. The SARS-CoV-2 RNA is generally detectable in upper and lower  respiratory specimens dur ing the acute phase of infection.  Positive  results are indicative of active infection with SARS-CoV-2.  Clinical  correlation with patient history and other diagnostic information is  necessary to  determine patient infection status.  Positive results do  not rule out bacterial infection or co-infection with other viruses. If result is PRESUMPTIVE POSTIVE SARS-CoV-2 nucleic acids MAY BE PRESENT.   A presumptive positive result was obtained on the submitted specimen  and confirmed on repeat testing.  While 2019 novel coronavirus  (SARS-CoV-2) nucleic acids may be present in the submitted sample  additional confirmatory testing may be necessary for epidemiological  and / or clinical management purposes  to differentiate between  SARS-CoV-2 and other Sarbecovirus currently known to infect humans.  If clinically indicated additional testing with an alternate test  methodology [phone]) is advised. The SARS-CoV-2 RNA is generally  detectable in upper and lower respiratory sp ecimens during the acute  phase of infection. The expected result is Negative. Fact Sheet for Patients:  BoilerBrush.com.cy Fact Sheet for Healthcare Providers: https://pope.com/ This test is not yet approved or cleared by the [place] FDA and has been authorized for detection and/or diagnosis of SARS-CoV-2 by FDA under an Emergency Use Authorization (EUA).  This EUA will remain in effect (meaning this  test can be used) for the duration of the COVID-19 declaration under Section 564(b)(1) of the Act, 21 U.S.C. section 360bbb-3(b)(1), unless the authorization is terminated or revoked sooner. Performed at [facility], [address], [place], [place] [address]      Scheduled Meds:  folic acid  1 mg Oral Daily   heparin injection (subcutaneous)  5,000 Units Subcutaneous Q8H   insulin aspart  0-9 Units Subcutaneous TID WC   LORazepam  0-4 mg Intravenous Q12H   Or   LORazepam  0-4 mg Oral Q12H   metoprolol tartrate  25 mg Oral Q8H   thiamine  100 mg Oral Daily   Or   thiamine  100 mg Intravenous Daily   Continuous Infusions:  dextrose 5 % and 0.45%  NaCl 50 mL/hr at 08/16/18 0608    Procedures/Studies: Dg Elbow Complete Left  Result Date: 08/11/2018 CLINICAL DATA:  Recent fall with left-sided elbow pain, initial encounter EXAM: LEFT ELBOW - COMPLETE 3+ VIEW COMPARISON:  None. FINDINGS: Mild osteophytic changes are noted from the olecranon. No acute fracture or dislocation is noted. IMPRESSION: Mild degenerative change without acute abnormality. Electronically Signed   By: Alcide Clever M.D.   On: 08/11/2018 01:53   Ct Head Wo Contrast  Result Date: 08/11/2018 CLINICAL DATA:  Recent fall EXAM: CT HEAD WITHOUT CONTRAST CT CERVICAL SPINE WITHOUT CONTRAST TECHNIQUE: Multidetector CT imaging of the head and cervical spine was performed following the standard protocol without intravenous contrast. Multiplanar CT image reconstructions of the cervical spine were also generated. COMPARISON:  None. FINDINGS: CT HEAD FINDINGS Brain: Mild atrophic changes are noted. Chronic white matter ischemic changes seen. No findings to suggest acute hemorrhage, acute infarction or space-occupying mass lesion are noted. Vascular: No hyperdense vessel or unexpected calcification. Skull: Normal. Negative for fracture or focal lesion. Sinuses/Orbits: No acute finding. Other: Mild soft tissue swelling is noted in the left supraorbital region. CT CERVICAL SPINE FINDINGS Alignment: Within normal limits. Skull base and vertebrae: 7 cervical segments are well visualized. Vertebral body height is well maintained. Mild osteophytic changes noted. Facet hypertrophic changes are noted. No acute fracture or acute facet abnormality is noted. Soft tissues and spinal canal: Surrounding soft tissue structures show no acute abnormality. Upper chest: Lung apices are within normal limits. Other: None IMPRESSION: CT of the head: Chronic atrophic and ischemic changes. Left supraorbital soft tissue swelling consistent with the recent injury. CT of the cervical spine: Multilevel degenerative change  without acute abnormality. Electronically Signed   By: Alcide Clever M.D.   On: 08/11/2018 01:49   Ct Cervical Spine Wo Contrast  Result Date: 08/11/2018 CLINICAL DATA:  Recent fall EXAM: CT HEAD WITHOUT CONTRAST CT CERVICAL SPINE WITHOUT CONTRAST TECHNIQUE: Multidetector CT imaging of the head and cervical spine was performed following the standard protocol without intravenous contrast. Multiplanar CT image reconstructions of the cervical spine were also generated. COMPARISON:  None. FINDINGS: CT HEAD FINDINGS Brain: Mild atrophic changes are noted. Chronic white matter ischemic changes seen. No findings to suggest acute hemorrhage, acute infarction or space-occupying mass lesion are noted. Vascular: No hyperdense vessel or unexpected calcification. Skull: Normal. Negative for fracture or focal lesion. Sinuses/Orbits: No acute finding. Other: Mild soft tissue swelling is noted in the left supraorbital region. CT CERVICAL SPINE FINDINGS Alignment: Within normal limits. Skull base and vertebrae: 7 cervical segments are well visualized. Vertebral body height is well maintained. Mild osteophytic changes noted. Facet hypertrophic changes are noted. No acute fracture or acute facet abnormality is  noted. Soft tissues and spinal canal: Surrounding soft tissue structures show no acute abnormality. Upper chest: Lung apices are within normal limits. Other: None IMPRESSION: CT of the head: Chronic atrophic and ischemic changes. Left supraorbital soft tissue swelling consistent with the recent injury. CT of the cervical spine: Multilevel degenerative change without acute abnormality. Electronically Signed   By: Alcide CleverMark  Lukens M.D.   On: 08/11/2018 01:49   Dg Shoulder Left  Result Date: 08/13/2018 CLINICAL DATA:  Recent left shoulder dislocation EXAM: LEFT SHOULDER - 2+ VIEW COMPARISON:  08/11/2018 FINDINGS: Limited evaluation due to difficulty with patient positioning. Interval relocation of the left shoulder. Associated  Hill-Sachs fracture of the posterolateral humeral head, unchanged. No definite fracture of the inferior glenoid rim. Visualized left lung is clear. IMPRESSION: Interval relocation of the left shoulder. Associated Hill-Sachs fracture of the posterolateral humeral head, unchanged. No definite fracture of the inferior glenoid rim. Electronically Signed   By: Charline BillsSriyesh  Krishnan M.D.   On: 08/13/2018 05:31   Dg Shoulder Left  Result Date: 08/11/2018 CLINICAL DATA:  Recent fall with shoulder pain, initial encounter EXAM: LEFT SHOULDER - 2+ VIEW COMPARISON:  None. FINDINGS: Anterior inferior dislocation of the humeral head is noted with respect to the glenoid. Some fragmentation of the glenoid is seen. There are changes suspicious for an avulsion from the greater tuberosity. Degenerative changes of the acromioclavicular joint are seen IMPRESSION: Anterior inferior dislocation of the humeral head. There are changes consistent with glenoid fracture as well as findings suspicious for a greater tuberosity fracture from the. Electronically Signed   By: Alcide CleverMark  Lukens M.D.   On: 08/11/2018 01:54    Catarina Hartshornavid Bryndle Corredor, DO  [facility] Pager [phone]  If 7PM-7AM, please contact night-coverage [email or web address] Password [email or web address] 08/16/2018, 5:26 PM   LOS: 2 days

## 2018-08-17 DIAGNOSIS — D696 Thrombocytopenia, unspecified: Secondary | ICD-10-CM

## 2018-08-17 LAB — CBC
HCT: 41 % (ref 39.0–52.0)
Hemoglobin: 14.2 g/dL (ref 13.0–17.0)
MCH: 32.2 pg (ref 26.0–34.0)
MCHC: 34.6 g/dL (ref 30.0–36.0)
MCV: 93 fL (ref 80.0–100.0)
Platelets: 158 10*3/uL (ref 150–400)
RBC: 4.41 MIL/uL (ref 4.22–5.81)
RDW: 13 % (ref 11.5–15.5)
WBC: 8.6 10*3/uL (ref 4.0–10.5)
nRBC: 0 % (ref 0.0–0.2)

## 2018-08-17 LAB — BASIC METABOLIC PANEL
Anion gap: 14 (ref 5–15)
BUN: 10 mg/dL (ref 8–23)
CO2: 21 mmol/L — ABNORMAL LOW (ref 22–32)
Calcium: 8.9 mg/dL (ref 8.9–10.3)
Chloride: 102 mmol/L (ref 98–111)
Creatinine, Ser: 0.56 mg/dL — ABNORMAL LOW (ref 0.61–1.24)
GFR calc Af Amer: >60 mL/min (ref >60)
GFR calc non Af Amer: >60 mL/min (ref >60)
Glucose, Bld: 137 mg/dL — ABNORMAL HIGH (ref 70–99)
Potassium: 3.5 mmol/L (ref 3.5–5.1)
Sodium: 137 mmol/L (ref 135–145)

## 2018-08-17 LAB — VITAMIN B12: Vitamin B-12: 1170 pg/mL — ABNORMAL HIGH (ref 180–914)

## 2018-08-17 LAB — AMMONIA: Ammonia: 23 umol/L (ref 9–35)

## 2018-08-17 LAB — FOLATE: Folate: 15.4 ng/mL (ref >5.9)

## 2018-08-17 LAB — MAGNESIUM: Magnesium: 2.3 mg/dL (ref 1.7–2.4)

## 2018-08-17 MED ORDER — POTASSIUM CHLORIDE CRYS ER 20 MEQ PO TBCR
20.0000 meq | EXTENDED_RELEASE_TABLET | Freq: Once | ORAL | Status: AC
  Administered 2018-08-17: 20 meq via ORAL
  Filled 2018-08-17: qty 1

## 2018-08-17 MED ORDER — LOPERAMIDE HCL 2 MG PO CAPS
2.0000 mg | ORAL_CAPSULE | ORAL | Status: DC | PRN
  Filled 2018-08-17: qty 1

## 2018-08-17 MED ORDER — NYSTATIN 100000 UNIT/GM EX POWD
Freq: Two times a day (BID) | CUTANEOUS | Status: DC
  Administered 2018-08-17 – 2018-08-18 (×3): via TOPICAL
  Filled 2018-08-17: qty 15

## 2018-08-17 MED ORDER — METOPROLOL TARTRATE 25 MG PO TABS
25.0000 mg | ORAL_TABLET | Freq: Two times a day (BID) | ORAL | Status: DC
  Administered 2018-08-17 – 2018-08-18 (×2): 25 mg via ORAL
  Filled 2018-08-17 (×3): qty 1

## 2018-08-17 MED ORDER — CALCIUM CARBONATE ANTACID 500 MG PO CHEW: 2.0000 | CHEWABLE_TABLET | Freq: Three times a day (TID) | ORAL | Status: DC | PRN

## 2018-08-17 NOTE — Progress Notes (Signed)
PROGRESS NOTE  Jon Bennett LNL:[id number] DOB: 02/15/1935 DOA: 08/13/2018 PCP: [name], MD  Brief History:  84 year old male with a history of impaired glucose tolerance, hypertension, alcoholic pancreatitis, and alcohol dependence presenting with altered mental status. Patient lives with his son. Son reports that he has not had any alcohol since Thursday 08/10/2018.  The son reported that the patient was tremulous and confused on the day of admission.In the emergency department, the patient was afebrile hemodynamically stable saturating 97% on room air.  Patient was admitted for alcohol withdrawal and started on alcohol withdrawal protocol.  Assessment/Plan: Alcohol withdrawal -Continue alcohol withdrawal protocol -The patient remains confused although less agitated today -Continue IV fluids -Check folic acid--15.4 -Check serum B12--1170 -ammonia 23 -Hepatitis B surface antigen -Hepatitis C antibody  Essential hypertension -Patient has not had any antihypertensive medications -Monitor clinically -continue metoprolol tartrate  Hyperglycemia -Likely stress-induced -08/13/2018 hemoglobin A1c 5.2 -Discontinue NovoLog sliding scale  Thrombocytopenia -due to chronic Etoh use and Etoh liver disease -B12--1170 -folic acid--15.4  Diarrhea -no leukocytosis or abd pain -start imodium -doubt cdiff infection    Disposition Plan:   Home 6/5 if stable Family Communication:   Family at bedside  Consultants:  none  Code Status:  FULL  DVT Prophylaxis:  [place] Heparin   Procedures: As Listed in Progress Note Above  Antibiotics: None    Subjective: Pt had 3 loose stools.  Denies abd pain.  Denies f/c cp, sob, n/v/d, dysuria. Objective: Vitals:   08/16/18 2027 08/16/18 2110 08/17/18 0458 08/17/18 1352  BP:  (!) 142/84 136/81 136/83  Pulse:  68 67 72  Resp:  20 16 17   Temp:  98.1 F (36.7 C) 97.9 F (36.6 C)   TempSrc:  Oral Oral   SpO2:  97% 98% 94% 100%  Weight:      Height:        Intake/Output Summary (Last 24 hours) at 08/17/2018 1415 Last data filed at 08/17/2018 0845 Gross per 24 hour  Intake 640 ml  Output 700 ml  Net -60 ml   Weight change:  Exam:   General:  Pt is alert, follows commands appropriately, not in acute distress  HEENT: No icterus, No thrush, No neck mass, [place]/AT  Cardiovascular: RRR, S1/S2, no rubs, no gallops  Respiratory: bibasilar crackles, no wheeze  Abdomen: Soft/+BS, non tender, non distended, no guarding  Extremities: No edema, No lymphangitis, No petechiae, No rashes, no synovitis   Data Reviewed: I have personally reviewed following labs and imaging studies Basic Metabolic Panel: Recent Labs  Lab 08/13/18 0452 08/14/18 0506 08/16/18 0518 08/17/18 0643  NA 143 142 137 137  K 3.5 4.0 3.6 3.5  CL 107 108 103 102  CO2 24 25 23  21*  GLUCOSE 133* 104* 131* 137*  BUN 14 13 11 10   CREATININE 0.93 0.73 0.63 0.56*  CALCIUM 9.1 8.5* 9.0 8.9  MG  --   --   --  2.3   Liver Function Tests: Recent Labs  Lab 08/14/18 0506  AST 99*  ALT 46*  ALKPHOS 91  BILITOT 0.9  PROT 5.7*  ALBUMIN 3.0*   No results for input(s): LIPASE, AMYLASE in the last 168 hours. Recent Labs  Lab 08/17/18 0643  AMMONIA 23   Coagulation Profile: No results for input(s): INR, PROTIME in the last 168 hours. CBC: Recent Labs  Lab 08/13/18 0452 08/14/18 0506 08/16/18 0518 08/17/18 1335  WBC 11.2* 9.1 11.1* 8.6  NEUTROABS  8.8*  --   --   --   HGB 15.5 14.4 14.7 14.2  HCT 45.0 43.3 42.3 41.0  MCV 94.1 96.4 92.0 93.0  PLT 154 144* 164 158   Cardiac Enzymes: No results for input(s): CKTOTAL, CKMB, CKMBINDEX, TROPONINI in the last 168 hours. BNP: Invalid input(s): POCBNP CBG: Recent Labs  Lab 08/15/18 1623 08/15/18 2213 08/16/18 0728 08/16/18 1107 08/16/18 1624  GLUCAP 108* 122* 117* 128* 130*   HbA1C: No results for input(s): HGBA1C in the last 72 hours. Urine analysis:      Component Value Date/Time   COLORURINE YELLOW 08/13/2018 1735   APPEARANCEUR CLEAR 08/13/2018 1735   LABSPEC 1.021 08/13/2018 1735   PHURINE 5.0 08/13/2018 1735   GLUCOSEU NEGATIVE 08/13/2018 1735   HGBUR LARGE (A) 08/13/2018 1735   BILIRUBINUR NEGATIVE 08/13/2018 1735   KETONESUR 20 (A) 08/13/2018 1735   PROTEINUR NEGATIVE 08/13/2018 1735   NITRITE POSITIVE (A) 08/13/2018 1735   LEUKOCYTESUR SMALL (A) 08/13/2018 1735   Sepsis Labs: @LABRCNTIP (procalcitonin:4,lacticidven:4) ) Recent Results (from the past 240 hour(s))  SARS Coronavirus 2 (CEPHEID - [facility]), Hosp Order     Status: None   Collection Time: 08/13/18  6:07 AM  Result Value Ref Range Status   SARS Coronavirus 2 NEGATIVE NEGATIVE Final    Comment: (NOTE) If result is NEGATIVE SARS-CoV-2 target nucleic acids are NOT DETECTED. The SARS-CoV-2 RNA is generally detectable in upper and lower  respiratory specimens during the acute phase of infection. The lowest  concentration of SARS-CoV-2 viral copies this assay can detect is 250  copies / mL. A negative result does not preclude SARS-CoV-2 infection  and should not be used as the sole basis for treatment or other  patient management decisions.  A negative result may occur with  improper specimen collection / handling, submission of specimen other  than nasopharyngeal swab, presence of viral mutation(s) within the  areas targeted by this assay, and inadequate number of viral copies  (<250 copies / mL). A negative result must be combined with clinical  observations, patient history, and epidemiological information. If result is POSITIVE SARS-CoV-2 target nucleic acids are DETECTED. The SARS-CoV-2 RNA is generally detectable in upper and lower  respiratory specimens dur ing the acute phase of infection.  Positive  results are indicative of active infection with SARS-CoV-2.  Clinical  correlation with patient history and other diagnostic  information is  necessary to determine patient infection status.  Positive results do  not rule out bacterial infection or co-infection with other viruses. If result is PRESUMPTIVE POSTIVE SARS-CoV-2 nucleic acids MAY BE PRESENT.   A presumptive positive result was obtained on the submitted specimen  and confirmed on repeat testing.  While 2019 novel coronavirus  (SARS-CoV-2) nucleic acids may be present in the submitted sample  additional confirmatory testing may be necessary for epidemiological  and / or clinical management purposes  to differentiate between  SARS-CoV-2 and other Sarbecovirus currently known to infect humans.  If clinically indicated additional testing with an alternate test  methodology [phone]([id number]) is advised. The SARS-CoV-2 RNA is generally  detectable in upper and lower respiratory sp ecimens during the acute  phase of infection. The expected result is Negative. Fact Sheet for Patients:  BoilerBrush.com.cyhttps://www.fda.gov/media/136312/download Fact Sheet for Healthcare Providers: https://pope.com/https://www.fda.gov/media/136313/download This test is not yet approved or cleared by the [place] FDA and has been authorized for detection and/or diagnosis of SARS-CoV-2 by FDA under an Emergency Use Authorization (EUA).  This  EUA will remain in effect (meaning this test can be used) for the duration of the COVID-19 declaration under Section 564(b)(1) of the Act, 21 U.S.C. section 360bbb-3(b)(1), unless the authorization is terminated or revoked sooner. Performed at [facility], [address] [place], [place] [address]      Scheduled Meds:  folic acid  1 mg Oral Daily   heparin injection (subcutaneous)  5,000 Units Subcutaneous Q8H   metoprolol tartrate  25 mg Oral BID   thiamine  100 mg Oral Daily   Or   thiamine  100 mg Intravenous Daily   Continuous Infusions:  dextrose 5 % and 0.45% NaCl 50 mL/hr at 08/17/18 0149    Procedures/Studies: Dg Elbow Complete Left  Result Date:  08/11/2018 CLINICAL DATA:  Recent fall with left-sided elbow pain, initial encounter EXAM: LEFT ELBOW - COMPLETE 3+ VIEW COMPARISON:  None. FINDINGS: Mild osteophytic changes are noted from the olecranon. No acute fracture or dislocation is noted. IMPRESSION: Mild degenerative change without acute abnormality. Electronically Signed   By: Alcide Clever M.D.   On: 08/11/2018 01:53   Ct Head Wo Contrast  Result Date: 08/11/2018 CLINICAL DATA:  Recent fall EXAM: CT HEAD WITHOUT CONTRAST CT CERVICAL SPINE WITHOUT CONTRAST TECHNIQUE: Multidetector CT imaging of the head and cervical spine was performed following the standard protocol without intravenous contrast. Multiplanar CT image reconstructions of the cervical spine were also generated. COMPARISON:  None. FINDINGS: CT HEAD FINDINGS Brain: Mild atrophic changes are noted. Chronic white matter ischemic changes seen. No findings to suggest acute hemorrhage, acute infarction or space-occupying mass lesion are noted. Vascular: No hyperdense vessel or unexpected calcification. Skull: Normal. Negative for fracture or focal lesion. Sinuses/Orbits: No acute finding. Other: Mild soft tissue swelling is noted in the left supraorbital region. CT CERVICAL SPINE FINDINGS Alignment: Within normal limits. Skull base and vertebrae: 7 cervical segments are well visualized. Vertebral body height is well maintained. Mild osteophytic changes noted. Facet hypertrophic changes are noted. No acute fracture or acute facet abnormality is noted. Soft tissues and spinal canal: Surrounding soft tissue structures show no acute abnormality. Upper chest: Lung apices are within normal limits. Other: None IMPRESSION: CT of the head: Chronic atrophic and ischemic changes. Left supraorbital soft tissue swelling consistent with the recent injury. CT of the cervical spine: Multilevel degenerative change without acute abnormality. Electronically Signed   By: Alcide Clever M.D.   On: 08/11/2018 01:49    Ct Cervical Spine Wo Contrast  Result Date: 08/11/2018 CLINICAL DATA:  Recent fall EXAM: CT HEAD WITHOUT CONTRAST CT CERVICAL SPINE WITHOUT CONTRAST TECHNIQUE: Multidetector CT imaging of the head and cervical spine was performed following the standard protocol without intravenous contrast. Multiplanar CT image reconstructions of the cervical spine were also generated. COMPARISON:  None. FINDINGS: CT HEAD FINDINGS Brain: Mild atrophic changes are noted. Chronic white matter ischemic changes seen. No findings to suggest acute hemorrhage, acute infarction or space-occupying mass lesion are noted. Vascular: No hyperdense vessel or unexpected calcification. Skull: Normal. Negative for fracture or focal lesion. Sinuses/Orbits: No acute finding. Other: Mild soft tissue swelling is noted in the left supraorbital region. CT CERVICAL SPINE FINDINGS Alignment: Within normal limits. Skull base and vertebrae: 7 cervical segments are well visualized. Vertebral body height is well maintained. Mild osteophytic changes noted. Facet hypertrophic changes are noted. No acute fracture or acute facet abnormality is noted. Soft tissues and spinal canal: Surrounding soft tissue structures show no acute abnormality. Upper chest: Lung apices are within normal limits.  Other: None IMPRESSION: CT of the head: Chronic atrophic and ischemic changes. Left supraorbital soft tissue swelling consistent with the recent injury. CT of the cervical spine: Multilevel degenerative change without acute abnormality. Electronically Signed   By: Alcide Clever M.D.   On: 08/11/2018 01:49   Dg Shoulder Left  Result Date: 08/13/2018 CLINICAL DATA:  Recent left shoulder dislocation EXAM: LEFT SHOULDER - 2+ VIEW COMPARISON:  08/11/2018 FINDINGS: Limited evaluation due to difficulty with patient positioning. Interval relocation of the left shoulder. [place]-Sachs fracture of the posterolateral humeral head, unchanged. No definite fracture of the  inferior glenoid rim. Visualized left lung is clear. IMPRESSION: Interval relocation of the left shoulder. [place]-Sachs fracture of the posterolateral humeral head, unchanged. No definite fracture of the inferior glenoid rim. Electronically Signed   By: Charline Bills M.D.   On: 08/13/2018 05:31   Dg Shoulder Left  Result Date: 08/11/2018 CLINICAL DATA:  Recent fall with shoulder pain, initial encounter EXAM: LEFT SHOULDER - 2+ VIEW COMPARISON:  None. FINDINGS: Anterior inferior dislocation of the humeral head is noted with respect to the glenoid. Some fragmentation of the glenoid is seen. There are changes suspicious for an avulsion from the greater tuberosity. Degenerative changes of the acromioclavicular joint are seen IMPRESSION: Anterior inferior dislocation of the humeral head. There are changes consistent with glenoid fracture as well as findings suspicious for a greater tuberosity fracture from the. Electronically Signed   By: Alcide Clever M.D.   On: 08/11/2018 01:54    Catarina Hartshorn, DO  [facility] Pager [phone]  If 7PM-7AM, please contact night-coverage [email or web address] Password [email or web address] 08/17/2018, 2:15 PM   LOS: 3 days

## 2018-08-18 LAB — BASIC METABOLIC PANEL
Anion gap: 9 (ref 5–15)
BUN: 11 mg/dL (ref 8–23)
CO2: 25 mmol/L (ref 22–32)
Calcium: 9 mg/dL (ref 8.9–10.3)
Chloride: 105 mmol/L (ref 98–111)
Creatinine, Ser: 0.7 mg/dL (ref 0.61–1.24)
GFR calc Af Amer: >60 mL/min (ref >60)
GFR calc non Af Amer: >60 mL/min (ref >60)
Glucose, Bld: 114 mg/dL — ABNORMAL HIGH (ref 70–99)
Potassium: 3.8 mmol/L (ref 3.5–5.1)
Sodium: 139 mmol/L (ref 135–145)

## 2018-08-18 MED ORDER — METOPROLOL TARTRATE 25 MG PO TABS: 25.0000 mg | ORAL_TABLET | Freq: Two times a day (BID) | ORAL | 1 refills | Status: DC

## 2018-08-18 NOTE — Evaluation (Signed)
Physical Therapy Evaluation Patient Details Name: Jon Bennett MRN: [id number] DOB: January 31, 1935 Today's Date: 08/18/2018   History of Present Illness  Jon Bennett  is a 84 y.o. male, with history of diabetes mellitus type 2, hypertension, recent fall with resultant left shoulder dislocation who was brought to ED with altered mental status.  Patient with his son and son reported that patient was having hallucinations with concern for alcohol withdrawal and so he was brought to ED.  As per son patient has not had any alcohol since Thursday.He does complain of left shoulder pain.    Clinical Impression  Patient presents with sling to LUE, demonstrates labored movement with assistance to sit up at bedside, unsteady on feet with high risk for falls, required hand held assist to ambulate to bathroom, demonstrates mostly step-to gait pattern when using SPC, limited secondary to c/o fatigue and tolerated sitting up in chair after therapy - RN notified.  Patient will benefit from continued physical therapy in hospital and recommended venue below to increase strength, balance, endurance for safe ADLs and gait.    Follow Up Recommendations SNF    Equipment Recommendations  None recommended by PT    Recommendations for Other Services       Precautions / Restrictions Precautions Precautions: Fall Restrictions Weight Bearing Restrictions: Yes LUE Weight Bearing: Weight bearing as tolerated      Mobility  Bed Mobility Overal bed mobility: Needs Assistance Bed Mobility: Sit to Supine       Sit to supine: Min assist   General bed mobility comments: increased time, labored movement  Transfers Overall transfer level: Needs assistance Equipment used: 1 person hand held assist;Straight cane Transfers: Sit to/from Stand;Stand Pivot Transfers Sit to Stand: Min assist Stand pivot transfers: Min assist;Mod assist       General transfer comment: increased time, labored movement, unsteady on  feet  Ambulation/Gait Ambulation/Gait assistance: Min assist;Mod assist Gait Distance (Feet): 15 Feet Assistive device: Straight cane;1 person hand held assist Gait Pattern/deviations: Decreased step length - right;Decreased step length - left;Decreased stride length;Step-to pattern Gait velocity: decreased   General Gait Details: limited to ambulation to bathroom with slow unsteady cadence, mostly step-to pattern using SPC with near loss of balance  Stairs            Wheelchair Mobility    Modified Rankin (Stroke Patients Only)       Balance Overall balance assessment: Needs assistance Sitting-balance support: Feet supported;Single extremity supported Sitting balance-Leahy Scale: Fair     Standing balance support: Single extremity supported;During functional activity Standing balance-Leahy Scale: Poor Standing balance comment: fair/poor using SPC                             Pertinent Vitals/Pain Pain Assessment: No/denies pain    Home Living Family/patient expects to be discharged to:: Private residence Living Arrangements: Children Available Help at Discharge: Family Type of Home: House Home Access: Stairs to enter Entrance Stairs-Rails: Right;Left;Can reach both Entrance Stairs-Number of Steps: 2 Home Layout: One level Home Equipment: Environmental consultant - 2 wheels;Cane - single point;Wheelchair - manual      Prior Function Level of Independence: Independent         Comments: community ambulator, drives     Higher education careers adviser        Extremity/Trunk Assessment   Upper Extremity Assessment Upper Extremity Assessment: Generalized weakness;RUE deficits/detail;LUE deficits/detail RUE Deficits / Details: grossly 4/5 LUE Deficits / Details: grossly -3/5 except  shoulder not tested due to sling    Lower Extremity Assessment Lower Extremity Assessment: Generalized weakness    Cervical / Trunk Assessment Cervical / Trunk Assessment: Normal   Communication   Communication: No difficulties  Cognition Arousal/Alertness: Awake/alert Behavior During Therapy: WFL for tasks assessed/performed Overall Cognitive Status: Within Functional Limits for tasks assessed                                 General Comments: appears slightly confused, but cooperative and answers questions, follows directions appropriately      General Comments      Exercises     Assessment/Plan    PT Assessment Patient needs continued PT services  PT Problem List Decreased strength;Decreased activity tolerance;Decreased balance;Decreased mobility       PT Treatment Interventions Therapeutic exercise;Gait training;Stair training;Functional mobility training;Therapeutic activities;Patient/family education    PT Goals (Current goals can be found in the Care Plan section)  Acute Rehab PT Goals Patient Stated Goal: return home after rehab PT Goal Formulation: With patient Time For Goal Achievement: 09/01/18 Potential to Achieve Goals: Good    Frequency Min 3X/week   Barriers to discharge        Co-evaluation               AM-PAC PT "6 Clicks" Mobility  Outcome Measure Help needed turning from your back to your side while in a flat bed without using bedrails?: A Little Help needed moving from lying on your back to sitting on the side of a flat bed without using bedrails?: A Lot Help needed moving to and from a bed to a chair (including a wheelchair)?: A Lot Help needed standing up from a chair using your arms (e.g., wheelchair or bedside chair)?: A Lot Help needed to walk in hospital room?: A Lot Help needed climbing 3-5 steps with a railing? : A Lot 6 Click Score: 13    End of Session   Activity Tolerance: Patient tolerated treatment well;Patient limited by fatigue Patient left: in chair;with call bell/phone within reach;with chair alarm set Nurse Communication: Mobility status PT Visit Diagnosis: Unsteadiness on feet  (R26.81);Other abnormalities of gait and mobility (R26.89);Muscle weakness (generalized) (M62.81)    Time: [id number] PT Time Calculation (min) (ACUTE ONLY): 23 min   Charges:   PT Evaluation $PT Eval Moderate Complexity: 1 Mod PT Treatments $Therapeutic Activity: 23-37 mins        11:20 AM, 08/18/18 Ocie BobJames Tarus Briski, MPT Physical Therapist with [facility] 336 [phone] office [phone] mobile phone

## 2018-08-18 NOTE — Discharge Summary (Signed)
Physician Discharge Summary  Jon Bennett WUX:[id number] DOB: Mar 29, 1934 DOA: 08/13/2018  PCP: [name], MD  Admit date: 08/13/2018 Discharge date: 08/18/2018  Admitted From: Home Disposition:  Home   Recommendations for Outpatient Follow-up:  1. Follow up with PCP in 1-2 weeks 2. Please obtain BMP/CBC in one week   Home Health: YES Equipment/Devices: HHPT  Discharge Condition: Stable CODE STATUS: FULL Diet recommendation: Heart Healthy    Brief/Interim Summary: 84 year old male with a history of impaired glucose tolerance, hypertension, alcoholic pancreatitis, and alcohol dependence presenting with altered mental status.Patient lives with his son. Son reports that he has not had any alcohol since Thursday5/28/2020.The son reported that the patient was tremulous and confusedon the day of admission.In the emergency department, the patient was afebrile hemodynamically stable saturating 97% on room air. Patient was admitted for alcohol withdrawal and started on alcohol withdrawal protocol.   Discharge Diagnoses:  Alcohol withdrawal -Continue alcohol withdrawal protocol -The patient gradually improved and subsequently did not require any further ativan -on the day of d/c he was A&O x 3 -Continue IV fluids -Check folic acid--15.4 -Check serum B12--1170 -ammonia 23   Essential hypertension -Patient has not had any antihypertensive medications -Monitor clinically -continue metoprolol tartrate  Hyperglycemia -Likely stress-induced -08/13/2018 hemoglobin A1c 5.2 -Discontinue NovoLog sliding scale  Thrombocytopenia -due to chronic Etoh use and Etoh liver disease -B12--1170 -folic acid--15.4 -stable without signs of active bleeding  Diarrhea -no leukocytosis or abd pain -started imodium initially, but improved without using it -doubt cdiff infection  Anxiety/Depression -restart buspar and zoloft   Discharge Instructions   Allergies as of 08/18/2018       Reactions   Asa [aspirin] Shortness Of Breath      Medication List    STOP taking these medications   doxepin 25 MG capsule Commonly known as:  SINEQUAN     TAKE these medications   busPIRone 10 MG tablet Commonly known as:  BUSPAR Take 10 mg by mouth 2 (two) times daily.   clopidogrel 75 MG tablet Commonly known as:  PLAVIX TAKE 1 TABLET BY MOUTH EVERY DAY   famotidine 20 MG tablet Commonly known as:  PEPCID Take 20 mg by mouth 2 (two) times daily.   metoprolol tartrate 25 MG tablet Commonly known as:  LOPRESSOR Take 1 tablet (25 mg total) by mouth 2 (two) times daily.   sertraline 100 MG tablet Commonly known as:  ZOLOFT Take 100 mg by mouth daily.       Allergies  Allergen Reactions   Asa [Aspirin] Shortness Of Breath    Consultations:  none   Procedures/Studies: Dg Elbow Complete Left  Result Date: 08/11/2018 CLINICAL DATA:  Recent fall with left-sided elbow pain, initial encounter EXAM: LEFT ELBOW - COMPLETE 3+ VIEW COMPARISON:  None. FINDINGS: Mild osteophytic changes are noted from the olecranon. No acute fracture or dislocation is noted. IMPRESSION: Mild degenerative change without acute abnormality. Electronically Signed   By: Alcide Clever M.D.   On: 08/11/2018 01:53   Ct Head Wo Contrast  Result Date: 08/11/2018 CLINICAL DATA:  Recent fall EXAM: CT HEAD WITHOUT CONTRAST CT CERVICAL SPINE WITHOUT CONTRAST TECHNIQUE: Multidetector CT imaging of the head and cervical spine was performed following the standard protocol without intravenous contrast. Multiplanar CT image reconstructions of the cervical spine were also generated. COMPARISON:  None. FINDINGS: CT HEAD FINDINGS Brain: Mild atrophic changes are noted. Chronic white matter ischemic changes seen. No findings to suggest acute hemorrhage, acute infarction or space-occupying mass lesion are noted.  Vascular: No hyperdense vessel or unexpected calcification. Skull: Normal. Negative for fracture or  focal lesion. Sinuses/Orbits: No acute finding. Other: Mild soft tissue swelling is noted in the left supraorbital region. CT CERVICAL SPINE FINDINGS Alignment: Within normal limits. Skull base and vertebrae: 7 cervical segments are well visualized. Vertebral body height is well maintained. Mild osteophytic changes noted. Facet hypertrophic changes are noted. No acute fracture or acute facet abnormality is noted. Soft tissues and spinal canal: Surrounding soft tissue structures show no acute abnormality. Upper chest: Lung apices are within normal limits. Other: None IMPRESSION: CT of the head: Chronic atrophic and ischemic changes. Left supraorbital soft tissue swelling consistent with the recent injury. CT of the cervical spine: Multilevel degenerative change without acute abnormality. Electronically Signed   By: Alcide Clever M.D.   On: 08/11/2018 01:49   Ct Cervical Spine Wo Contrast  Result Date: 08/11/2018 CLINICAL DATA:  Recent fall EXAM: CT HEAD WITHOUT CONTRAST CT CERVICAL SPINE WITHOUT CONTRAST TECHNIQUE: Multidetector CT imaging of the head and cervical spine was performed following the standard protocol without intravenous contrast. Multiplanar CT image reconstructions of the cervical spine were also generated. COMPARISON:  None. FINDINGS: CT HEAD FINDINGS Brain: Mild atrophic changes are noted. Chronic white matter ischemic changes seen. No findings to suggest acute hemorrhage, acute infarction or space-occupying mass lesion are noted. Vascular: No hyperdense vessel or unexpected calcification. Skull: Normal. Negative for fracture or focal lesion. Sinuses/Orbits: No acute finding. Other: Mild soft tissue swelling is noted in the left supraorbital region. CT CERVICAL SPINE FINDINGS Alignment: Within normal limits. Skull base and vertebrae: 7 cervical segments are well visualized. Vertebral body height is well maintained. Mild osteophytic changes noted. Facet hypertrophic changes are noted. No acute  fracture or acute facet abnormality is noted. Soft tissues and spinal canal: Surrounding soft tissue structures show no acute abnormality. Upper chest: Lung apices are within normal limits. Other: None IMPRESSION: CT of the head: Chronic atrophic and ischemic changes. Left supraorbital soft tissue swelling consistent with the recent injury. CT of the cervical spine: Multilevel degenerative change without acute abnormality. Electronically Signed   By: Alcide Clever M.D.   On: 08/11/2018 01:49   Dg Shoulder Left  Result Date: 08/13/2018 CLINICAL DATA:  Recent left shoulder dislocation EXAM: LEFT SHOULDER - 2+ VIEW COMPARISON:  08/11/2018 FINDINGS: Limited evaluation due to difficulty with patient positioning. Interval relocation of the left shoulder. Associated Hill-Sachs fracture of the posterolateral humeral head, unchanged. No definite fracture of the inferior glenoid rim. Visualized left lung is clear. IMPRESSION: Interval relocation of the left shoulder. Associated Hill-Sachs fracture of the posterolateral humeral head, unchanged. No definite fracture of the inferior glenoid rim. Electronically Signed   By: Charline Bills M.D.   On: 08/13/2018 05:31   Dg Shoulder Left  Result Date: 08/11/2018 CLINICAL DATA:  Recent fall with shoulder pain, initial encounter EXAM: LEFT SHOULDER - 2+ VIEW COMPARISON:  None. FINDINGS: Anterior inferior dislocation of the humeral head is noted with respect to the glenoid. Some fragmentation of the glenoid is seen. There are changes suspicious for an avulsion from the greater tuberosity. Degenerative changes of the acromioclavicular joint are seen IMPRESSION: Anterior inferior dislocation of the humeral head. There are changes consistent with glenoid fracture as well as findings suspicious for a greater tuberosity fracture from the. Electronically Signed   By: Alcide Clever M.D.   On: 08/11/2018 01:54        Discharge Exam: Vitals:   08/17/18 2239 08/18/18 1610  BP:  136/85 (!) 155/93  Pulse: 70 69  Resp: 17 20  Temp: 97.9 F (36.6 C) (!) 97.5 F (36.4 C)  SpO2: 94% 97%   Vitals:   08/17/18 0458 08/17/18 1352 08/17/18 2239 08/18/18 0523  BP: 136/81 136/83 136/85 (!) 155/93  Pulse: 67 72 70 69  Resp: 16 17 17 20   Temp: 97.9 F (36.6 C)  97.9 F (36.6 C) (!) 97.5 F (36.4 C)  TempSrc: Oral   Oral  SpO2: 94% 100% 94% 97%  Weight:      Height:        General: Pt is alert, awake, not in acute distress Cardiovascular: RRR, S1/S2 +, no rubs, no gallops Respiratory: CTA bilaterally, no wheezing, no rhonchi Abdominal: Soft, NT, ND, bowel sounds + Extremities: no edema, no cyanosis   The results of significant diagnostics from this hospitalization (including imaging, microbiology, ancillary and laboratory) are listed below for reference.    Significant Diagnostic Studies: Dg Elbow Complete Left  Result Date: 08/11/2018 CLINICAL DATA:  Recent fall with left-sided elbow pain, initial encounter EXAM: LEFT ELBOW - COMPLETE 3+ VIEW COMPARISON:  None. FINDINGS: Mild osteophytic changes are noted from the olecranon. No acute fracture or dislocation is noted. IMPRESSION: Mild degenerative change without acute abnormality. Electronically Signed   By: Alcide CleverMark  Lukens M.D.   On: 08/11/2018 01:53   Ct Head Wo Contrast  Result Date: 08/11/2018 CLINICAL DATA:  Recent fall EXAM: CT HEAD WITHOUT CONTRAST CT CERVICAL SPINE WITHOUT CONTRAST TECHNIQUE: Multidetector CT imaging of the head and cervical spine was performed following the standard protocol without intravenous contrast. Multiplanar CT image reconstructions of the cervical spine were also generated. COMPARISON:  None. FINDINGS: CT HEAD FINDINGS Brain: Mild atrophic changes are noted. Chronic white matter ischemic changes seen. No findings to suggest acute hemorrhage, acute infarction or space-occupying mass lesion are noted. Vascular: No hyperdense vessel or unexpected calcification. Skull: Normal. Negative for  fracture or focal lesion. Sinuses/Orbits: No acute finding. Other: Mild soft tissue swelling is noted in the left supraorbital region. CT CERVICAL SPINE FINDINGS Alignment: Within normal limits. Skull base and vertebrae: 7 cervical segments are well visualized. Vertebral body height is well maintained. Mild osteophytic changes noted. Facet hypertrophic changes are noted. No acute fracture or acute facet abnormality is noted. Soft tissues and spinal canal: Surrounding soft tissue structures show no acute abnormality. Upper chest: Lung apices are within normal limits. Other: None IMPRESSION: CT of the head: Chronic atrophic and ischemic changes. Left supraorbital soft tissue swelling consistent with the recent injury. CT of the cervical spine: Multilevel degenerative change without acute abnormality. Electronically Signed   By: Alcide CleverMark  Lukens M.D.   On: 08/11/2018 01:49   Ct Cervical Spine Wo Contrast  Result Date: 08/11/2018 CLINICAL DATA:  Recent fall EXAM: CT HEAD WITHOUT CONTRAST CT CERVICAL SPINE WITHOUT CONTRAST TECHNIQUE: Multidetector CT imaging of the head and cervical spine was performed following the standard protocol without intravenous contrast. Multiplanar CT image reconstructions of the cervical spine were also generated. COMPARISON:  None. FINDINGS: CT HEAD FINDINGS Brain: Mild atrophic changes are noted. Chronic white matter ischemic changes seen. No findings to suggest acute hemorrhage, acute infarction or space-occupying mass lesion are noted. Vascular: No hyperdense vessel or unexpected calcification. Skull: Normal. Negative for fracture or focal lesion. Sinuses/Orbits: No acute finding. Other: Mild soft tissue swelling is noted in the left supraorbital region. CT CERVICAL SPINE FINDINGS Alignment: Within normal limits. Skull base and vertebrae: 7 cervical segments are well visualized.  Vertebral body height is well maintained. Mild osteophytic changes noted. Facet hypertrophic changes are noted.  No acute fracture or acute facet abnormality is noted. Soft tissues and spinal canal: Surrounding soft tissue structures show no acute abnormality. Upper chest: Lung apices are within normal limits. Other: None IMPRESSION: CT of the head: Chronic atrophic and ischemic changes. Left supraorbital soft tissue swelling consistent with the recent injury. CT of the cervical spine: Multilevel degenerative change without acute abnormality. Electronically Signed   By: Alcide Clever M.D.   On: 08/11/2018 01:49   Dg Shoulder Left  Result Date: 08/13/2018 CLINICAL DATA:  Recent left shoulder dislocation EXAM: LEFT SHOULDER - 2+ VIEW COMPARISON:  08/11/2018 FINDINGS: Limited evaluation due to difficulty with patient positioning. Interval relocation of the left shoulder. Associated Hill-Sachs fracture of the posterolateral humeral head, unchanged. No definite fracture of the inferior glenoid rim. Visualized left lung is clear. IMPRESSION: Interval relocation of the left shoulder. Associated Hill-Sachs fracture of the posterolateral humeral head, unchanged. No definite fracture of the inferior glenoid rim. Electronically Signed   By: Charline Bills M.D.   On: 08/13/2018 05:31   Dg Shoulder Left  Result Date: 08/11/2018 CLINICAL DATA:  Recent fall with shoulder pain, initial encounter EXAM: LEFT SHOULDER - 2+ VIEW COMPARISON:  None. FINDINGS: Anterior inferior dislocation of the humeral head is noted with respect to the glenoid. Some fragmentation of the glenoid is seen. There are changes suspicious for an avulsion from the greater tuberosity. Degenerative changes of the acromioclavicular joint are seen IMPRESSION: Anterior inferior dislocation of the humeral head. There are changes consistent with glenoid fracture as well as findings suspicious for a greater tuberosity fracture from the. Electronically Signed   By: Alcide Clever M.D.   On: 08/11/2018 01:54     Microbiology: Recent Results (from the past 240 hour(s))    SARS Coronavirus 2 (CEPHEID - Performed in [facility] hospital lab), Hosp Order     Status: None   Collection Time: 08/13/18  6:07 AM  Result Value Ref Range Status   SARS Coronavirus 2 NEGATIVE NEGATIVE Final    Comment: (NOTE) If result is NEGATIVE SARS-CoV-2 target nucleic acids are NOT DETECTED. The SARS-CoV-2 RNA is generally detectable in upper and lower  respiratory specimens during the acute phase of infection. The lowest  concentration of SARS-CoV-2 viral copies this assay can detect is 250  copies / mL. A negative result does not preclude SARS-CoV-2 infection  and should not be used as the sole basis for treatment or other  patient management decisions.  A negative result may occur with  improper specimen collection / handling, submission of specimen other  than nasopharyngeal swab, presence of viral mutation(s) within the  areas targeted by this assay, and inadequate number of viral copies  (<250 copies / mL). A negative result must be combined with clinical  observations, patient history, and epidemiological information. If result is POSITIVE SARS-CoV-2 target nucleic acids are DETECTED. The SARS-CoV-2 RNA is generally detectable in upper and lower  respiratory specimens dur ing the acute phase of infection.  Positive  results are indicative of active infection with SARS-CoV-2.  Clinical  correlation with patient history and other diagnostic information is  necessary to determine patient infection status.  Positive results do  not rule out bacterial infection or co-infection with other viruses. If result is PRESUMPTIVE POSTIVE SARS-CoV-2 nucleic acids MAY BE PRESENT.   A presumptive positive result was obtained on the submitted specimen  and confirmed on repeat  testing.  While 2019 novel coronavirus  (SARS-CoV-2) nucleic acids may be present in the submitted sample  additional confirmatory testing may be necessary for epidemiological  and / or clinical management  purposes  to differentiate between  SARS-CoV-2 and other Sarbecovirus currently known to infect humans.  If clinically indicated additional testing with an alternate test  methodology [phone]) is advised. The SARS-CoV-2 RNA is generally  detectable in upper and lower respiratory sp ecimens during the acute  phase of infection. The expected result is Negative. Fact Sheet for Patients:  BoilerBrush.com.cy Fact Sheet for Healthcare Providers: https://pope.com/ This test is not yet approved or cleared by the [place] FDA and has been authorized for detection and/or diagnosis of SARS-CoV-2 by FDA under an Emergency Use Authorization (EUA).  This EUA will remain in effect (meaning this test can be used) for the duration of the COVID-19 declaration under Section 564(b)(1) of the Act, 21 U.S.C. section 360bbb-3(b)(1), unless the authorization is terminated or revoked sooner. Performed at [facility], [address], [place], [place] [address]      Labs: Basic Metabolic Panel: Recent Labs  Lab 08/13/18 [phone] 08/14/18 0506 08/16/18 0518 08/17/18 0643 08/18/18 0650  NA 143 142 137 137 139  K 3.5 4.0 3.6 3.5 3.8  CL 107 108 103 102 105  CO2 21* 25  GLUCOSE 133* 104* 131* 137* 114*  BUN CREATININE 0.93 0.73 0.63 0.56* 0.70  CALCIUM 9.1 8.5* 9.0 8.9 9.0  MG  --   --   --  2.3  --    Liver Function Tests: Recent Labs  Lab 08/14/18 0506  AST 99*  ALT 46*  ALKPHOS 91  BILITOT 0.9  PROT 5.7*  ALBUMIN 3.0*   No results for input(s): LIPASE, AMYLASE in the last 168 hours. Recent Labs  Lab 08/17/18 0643  AMMONIA 23   CBC: Recent Labs  Lab 08/13/18 0452 08/14/18 0506 08/16/18 0518 08/17/18 1335  WBC 11.2* 9.1 11.1* 8.6  NEUTROABS 8.8*  --   --   --   HGB 15.5 14.4 14.7 14.2  HCT 45.0 43.3 42.3 41.0  MCV 94.1 96.4 92.0 93.0  PLT 154 144* 164 158   Cardiac Enzymes: No results for input(s):  CKTOTAL, CKMB, CKMBINDEX, TROPONINI in the last 168 hours. BNP: Invalid input(s): POCBNP CBG: Recent Labs  Lab 08/15/18 1623 08/15/18 2213 08/16/18 0728 08/16/18 1107 08/16/18 1624  GLUCAP 108* 122* 117* 128* 130*    Time coordinating discharge:  36 minutes  Signed:  Catarina Hartshorn, DO [facility] Pager: [phone] 08/18/2018, 9:55 AM

## 2018-08-18 NOTE — Plan of Care (Signed)
  Problem: Acute Rehab PT Goals(only PT should resolve) Goal: Pt Will Go Supine/Side To Sit Outcome: Progressing Flowsheets (Taken 08/18/2018 1121) Pt will go Supine/Side to Sit: with min guard assist Goal: Patient Will Transfer Sit To/From Stand Outcome: Progressing Flowsheets (Taken 08/18/2018 1121) Patient will transfer sit to/from stand: with min guard assist Goal: Pt Will Transfer Bed To Chair/Chair To Bed Outcome: Progressing Flowsheets (Taken 08/18/2018 1121) Pt will Transfer Bed to Chair/Chair to Bed: min guard assist Goal: Pt Will Ambulate Outcome: Progressing Flowsheets (Taken 08/18/2018 1121) Pt will Ambulate: 50 feet; with min guard assist; with cane   11:22 AM, 08/18/18 Ocie Bob, MPT Physical Therapist with [facility] 336 [phone] office [phone] mobile phone

## 2018-08-18 NOTE — Care Management Important Message (Signed)
Important Message  Patient Details  Name: Jon Bennett MRN: [id number] Date of Birth: 1934-04-11   Medicare Important Message Given:  Yes    Corey Harold 08/18/2018, 1:56 PM

## 2018-08-18 NOTE — NC FL2 (Signed)
MEDICAID FL2 LEVEL OF CARE SCREENING TOOL     IDENTIFICATION  Patient Name: Jon Bennett Birthdate: [birth year] Sex: male Admission Date (Current Location): 08/13/2018  [facility] and [place] Number:  [employer] and Address:  [facility],  [address], [place] [address]      Provider Number: [id number]  Attending Physician Name and Address:  Catarina Hartshorn, MD  Relative Name and Phone Number:  Shimon, Kanitz   [phone]     Current Level of Care: Hospital Recommended Level of Care: Skilled Nursing Facility Prior Approval Number:    Date Approved/Denied:   PASRR Number: [id number]  Discharge Plan: SNF    Current Diagnoses: Patient Active Problem List   Diagnosis Date Noted  . Thrombocytopenia (HCC) 08/16/2018  . Alcohol withdrawal (HCC) 08/13/2018    Orientation RESPIRATION BLADDER Height & Weight     Self, Time, Situation  Normal Continent Weight: 71.5 kg Height:  5\' 9"  (175.3 cm)  BEHAVIORAL SYMPTOMS/MOOD NEUROLOGICAL BOWEL NUTRITION STATUS      Continent Diet(carb modified)  AMBULATORY STATUS COMMUNICATION OF NEEDS Skin   Extensive Assist Verbally Other (Comment)(forehead laceration-left)                       Personal Care Assistance Level of Assistance  Bathing, Feeding, Dressing Bathing Assistance: Limited assistance Feeding assistance: Independent Dressing Assistance: Limited assistance     Functional Limitations Info  Sight, Hearing, Speech Sight Info: Adequate Hearing Info: Adequate Speech Info: Adequate    SPECIAL CARE FACTORS FREQUENCY  PT (By licensed PT)     PT Frequency: 5x/week              Contractures Contractures Info: Not present    Additional Factors Info  Code Status, Allergies, Psychotropic Code Status Info: FULL  Allergies Info: ASA Psychotropic Info: Buspar, zoloft         Current Medications (08/18/2018):  This is the current hospital active medication list Current  Facility-Administered Medications  Medication Dose Route Frequency Provider Last Rate Last Dose  . acetaminophen (TYLENOL) tablet 650 mg  650 mg Oral Q6H PRN Meredeth Ide, MD   650 mg at 08/18/18 0768   Or  . acetaminophen (TYLENOL) suppository 650 mg  650 mg Rectal Q6H PRN Meredeth Ide, MD      . calcium carbonate (TUMS - dosed in mg elemental calcium) chewable tablet 400 mg of elemental calcium  2 tablet Oral TID PRN Tat, Onalee Hua, MD      . folic acid (FOLVITE) tablet 1 mg  1 mg Oral Daily Emokpae, Courage, MD   1 mg at 08/18/18 0881  . heparin injection 5,000 Units  5,000 Units Subcutaneous Q8H Emokpae, Courage, MD   5,000 Units at 08/18/18 1031  . hydrALAZINE (APRESOLINE) injection 10 mg  10 mg Intravenous Q6H PRN Emokpae, Courage, MD      . loperamide (IMODIUM) capsule 2 mg  2 mg Oral PRN Tat, David, MD      . metoprolol tartrate (LOPRESSOR) tablet 25 mg  25 mg Oral BID Catarina Hartshorn, MD   25 mg at 08/18/18 5945  . nystatin (MYCOSTATIN/NYSTOP) topical powder   Topical BID Tat, David, MD      . ondansetron [facility]) tablet 4 mg  4 mg Oral Q6H PRN Meredeth Ide, MD       Or  . ondansetron (ZOFRAN) injection 4 mg  4 mg Intravenous Q6H PRN Meredeth Ide, MD  4 mg at 08/17/18 0904  . thiamine (VITAMIN B-1) tablet 100 mg  100 mg Oral Daily Lama, Gagan S, MD   100 mg at 08/18/18 0810   Or  . thiamine (B-1) injection 100 mg  100 mg Intravenous Daily Lama, Gagan S, MD         Discharge Medications: Please see discharge summary for a list of discharge medications.  Relevant Imaging Results:  Relevant Lab Results:   Additional Information SSN [id number]  Laveyah Oriol Diane, RN    

## 2018-08-18 NOTE — Progress Notes (Addendum)
Report called to Jimmye Norman, RN at [facility] in [place].  Patient has upper dentures in at this time and does not wear his lower dentures.  IV removed. Son to transport to facility and is bringing clothes.

## 2018-08-18 NOTE — TOC Transition Note (Addendum)
Transition of Care [facility]) - CM/SW Discharge Note   Patient Details  Name: Jon Bennett MRN: [id number] Date of Birth: 1934/08/17  [facility]) CM/SW Contact:  Jakarri Lesko, Chrystine Oiler, RN Phone Number: 08/18/2018, 3:40 PM   Clinical Narrative:  Patient discharging today to [facility]). DC clinicals sent. Son Jon Bennett) will transport patient. Bedside RN to call report [phone].      Barriers to Discharge: No Barriers Identified   Patient Goals and CMS Choice Patient states their goals for this hospitalization and ongoing recovery are:: GO TO [facility] AND GO HOE CMS Medicare.gov Compare Post Acute Care list provided to:: Patient(AND SON-PATIENT DEFERS TO [name]) Choice offered to / list presented to : Patient([name]-SON)  Discharge Placement              Patient chooses bed at: [facility]) Patient to be transferred to facility by: FAMILY -[name]-SON Name of family member notified: [name] Patient and family notified of of transfer: 08/18/18   No flowsheet data found.

## 2018-08-18 NOTE — [place] FL2 (Signed)
MEDICAID FL2 LEVEL OF CARE SCREENING TOOL     IDENTIFICATION  Patient Name: Payson Launer Birthdate: [birth year] Sex: male Admission Date (Current Location): 08/13/2018  [facility] and [place] Number:  [employer] and Address:  [facility],  [address], [place] [address]      Provider Number: [id number]  Attending Physician Name and Address:  Catarina Hartshorn, MD  Relative Name and Phone Number:  Shimon, Kanitz   [phone]     Current Level of Care: Hospital Recommended Level of Care: Skilled Nursing Facility Prior Approval Number:    Date Approved/Denied:   PASRR Number: [id number]  Discharge Plan: SNF    Current Diagnoses: Patient Active Problem List   Diagnosis Date Noted  . Thrombocytopenia (HCC) 08/16/2018  . Alcohol withdrawal (HCC) 08/13/2018    Orientation RESPIRATION BLADDER Height & Weight     Self, Time, Situation  Normal Continent Weight: 71.5 kg Height:  5\' 9"  (175.3 cm)  BEHAVIORAL SYMPTOMS/MOOD NEUROLOGICAL BOWEL NUTRITION STATUS      Continent Diet(carb modified)  AMBULATORY STATUS COMMUNICATION OF NEEDS Skin   Extensive Assist Verbally Other (Comment)(forehead laceration-left)                       Personal Care Assistance Level of Assistance  Bathing, Feeding, Dressing Bathing Assistance: Limited assistance Feeding assistance: Independent Dressing Assistance: Limited assistance     Functional Limitations Info  Sight, Hearing, Speech Sight Info: Adequate Hearing Info: Adequate Speech Info: Adequate    SPECIAL CARE FACTORS FREQUENCY  PT (By licensed PT)     PT Frequency: 5x/week              Contractures Contractures Info: Not present    Additional Factors Info  Code Status, Allergies, Psychotropic Code Status Info: FULL  Allergies Info: ASA Psychotropic Info: Buspar, zoloft         Current Medications (08/18/2018):  This is the current hospital active medication list Current  Facility-Administered Medications  Medication Dose Route Frequency Provider Last Rate Last Dose  . acetaminophen (TYLENOL) tablet 650 mg  650 mg Oral Q6H PRN Meredeth Ide, MD   650 mg at 08/18/18 0768   Or  . acetaminophen (TYLENOL) suppository 650 mg  650 mg Rectal Q6H PRN Meredeth Ide, MD      . calcium carbonate (TUMS - dosed in mg elemental calcium) chewable tablet 400 mg of elemental calcium  2 tablet Oral TID PRN Tat, Onalee Hua, MD      . folic acid (FOLVITE) tablet 1 mg  1 mg Oral Daily Emokpae, Courage, MD   1 mg at 08/18/18 0881  . heparin injection 5,000 Units  5,000 Units Subcutaneous Q8H Emokpae, Courage, MD   5,000 Units at 08/18/18 1031  . hydrALAZINE (APRESOLINE) injection 10 mg  10 mg Intravenous Q6H PRN Emokpae, Courage, MD      . loperamide (IMODIUM) capsule 2 mg  2 mg Oral PRN Tat, David, MD      . metoprolol tartrate (LOPRESSOR) tablet 25 mg  25 mg Oral BID Catarina Hartshorn, MD   25 mg at 08/18/18 5945  . nystatin (MYCOSTATIN/NYSTOP) topical powder   Topical BID Tat, David, MD      . ondansetron [facility]) tablet 4 mg  4 mg Oral Q6H PRN Meredeth Ide, MD       Or  . ondansetron (ZOFRAN) injection 4 mg  4 mg Intravenous Q6H PRN Meredeth Ide, MD  4 mg at 08/17/18 0904  . thiamine (VITAMIN B-1) tablet 100 mg  100 mg Oral Daily Meredeth IdeLama, Gagan S, MD   100 mg at 08/18/18 [id number]   Or  . thiamine (B-1) injection 100 mg  100 mg Intravenous Daily Meredeth IdeLama, Gagan S, MD         Discharge Medications: Please see discharge summary for a list of discharge medications.  Relevant Imaging Results:  Relevant Lab Results:   Additional Information SSN [id number]  Elanor Cale, Chrystine OilerSharley Diane, RN

## 2018-08-30 ENCOUNTER — Ambulatory Visit (HOSPITAL_COMMUNITY): Payer: Medicare Other | Admitting: Specialist

## 2018-08-30 ENCOUNTER — Encounter (HOSPITAL_COMMUNITY): Payer: Self-pay

## 2018-08-30 ENCOUNTER — Telehealth (HOSPITAL_COMMUNITY): Payer: Self-pay | Admitting: Family Medicine

## 2018-08-30 NOTE — Telephone Encounter (Signed)
08/30/18  son called to reschedule... said the patient was taking a shower and it is taking him some time to get acclumated so he wanted to reschedule.... patient is rescheduled for 6/29

## 2018-09-11 ENCOUNTER — Ambulatory Visit (HOSPITAL_COMMUNITY): Payer: Medicare Other | Attending: Occupational Therapy | Admitting: Occupational Therapy

## 2019-04-24 ENCOUNTER — Encounter (HOSPITAL_COMMUNITY): Payer: Self-pay | Admitting: Emergency Medicine

## 2019-04-24 ENCOUNTER — Other Ambulatory Visit: Payer: Self-pay

## 2019-04-24 ENCOUNTER — Emergency Department (HOSPITAL_COMMUNITY): Payer: Medicare Other

## 2019-04-24 ENCOUNTER — Inpatient Hospital Stay (HOSPITAL_COMMUNITY)
Admission: EM | Admit: 2019-04-24 | Discharge: 2019-05-01 | DRG: 897 | Disposition: A | Discharge status: Skilled Nursing Facility | Payer: Medicare Other | Attending: Internal Medicine | Admitting: Internal Medicine

## 2019-04-24 DIAGNOSIS — D696 Thrombocytopenia, unspecified: Secondary | ICD-10-CM | POA: Diagnosis present

## 2019-04-24 DIAGNOSIS — F10931 Alcohol use, unspecified with withdrawal delirium: Secondary | ICD-10-CM | POA: Diagnosis present

## 2019-04-24 DIAGNOSIS — E1151 Type 2 diabetes mellitus with diabetic peripheral angiopathy without gangrene: Secondary | ICD-10-CM | POA: Diagnosis present

## 2019-04-24 DIAGNOSIS — F10229 Alcohol dependence with intoxication, unspecified: Secondary | ICD-10-CM | POA: Diagnosis present

## 2019-04-24 DIAGNOSIS — F10231 Alcohol dependence with withdrawal delirium: Secondary | ICD-10-CM | POA: Diagnosis present

## 2019-04-24 DIAGNOSIS — Z7902 Long term (current) use of antithrombotics/antiplatelets: Secondary | ICD-10-CM

## 2019-04-24 DIAGNOSIS — Z886 Allergy status to analgesic agent status: Secondary | ICD-10-CM | POA: Diagnosis not present

## 2019-04-24 DIAGNOSIS — F10939 Alcohol use, unspecified with withdrawal, unspecified: Secondary | ICD-10-CM | POA: Diagnosis present

## 2019-04-24 DIAGNOSIS — K860 Alcohol-induced chronic pancreatitis: Secondary | ICD-10-CM | POA: Diagnosis present

## 2019-04-24 DIAGNOSIS — D6959 Other secondary thrombocytopenia: Secondary | ICD-10-CM | POA: Diagnosis present

## 2019-04-24 DIAGNOSIS — Y900 Blood alcohol level of less than 20 mg/100 ml: Secondary | ICD-10-CM | POA: Diagnosis present

## 2019-04-24 DIAGNOSIS — K219 Gastro-esophageal reflux disease without esophagitis: Secondary | ICD-10-CM | POA: Diagnosis present

## 2019-04-24 DIAGNOSIS — F10239 Alcohol dependence with withdrawal, unspecified: Secondary | ICD-10-CM | POA: Diagnosis present

## 2019-04-24 DIAGNOSIS — I10 Essential (primary) hypertension: Secondary | ICD-10-CM | POA: Diagnosis present

## 2019-04-24 DIAGNOSIS — Z20822 Contact with and (suspected) exposure to covid-19: Secondary | ICD-10-CM | POA: Diagnosis present

## 2019-04-24 DIAGNOSIS — Z79899 Other long term (current) drug therapy: Secondary | ICD-10-CM

## 2019-04-24 DIAGNOSIS — K769 Liver disease, unspecified: Secondary | ICD-10-CM | POA: Diagnosis present

## 2019-04-24 DIAGNOSIS — E119 Type 2 diabetes mellitus without complications: Secondary | ICD-10-CM

## 2019-04-24 LAB — URINALYSIS, ROUTINE W REFLEX MICROSCOPIC
Bilirubin Urine: NEGATIVE
Glucose, UA: NEGATIVE mg/dL
Hgb urine dipstick: NEGATIVE
Ketones, ur: NEGATIVE mg/dL
Nitrite: NEGATIVE
Protein, ur: 30 mg/dL — AB
Specific Gravity, Urine: 1.021 (ref 1.005–1.030)
pH: 5 (ref 5.0–8.0)

## 2019-04-24 LAB — COMPREHENSIVE METABOLIC PANEL
ALT: 14 U/L (ref 0–44)
AST: 22 U/L (ref 15–41)
Albumin: 4.4 g/dL (ref 3.5–5.0)
Alkaline Phosphatase: 72 U/L (ref 38–126)
Anion gap: 11 (ref 5–15)
BUN: 18 mg/dL (ref 8–23)
CO2: 24 mmol/L (ref 22–32)
Calcium: 9.5 mg/dL (ref 8.9–10.3)
Chloride: 105 mmol/L (ref 98–111)
Creatinine, Ser: 1.03 mg/dL (ref 0.61–1.24)
GFR calc Af Amer: >60 mL/min (ref >60)
GFR calc non Af Amer: >60 mL/min (ref >60)
Glucose, Bld: 163 mg/dL — ABNORMAL HIGH (ref 70–99)
Potassium: 3.8 mmol/L (ref 3.5–5.1)
Sodium: 140 mmol/L (ref 135–145)
Total Bilirubin: 1.1 mg/dL (ref 0.3–1.2)
Total Protein: 7.2 g/dL (ref 6.5–8.1)

## 2019-04-24 LAB — CBC WITH DIFFERENTIAL/PLATELET
Abs Immature Granulocytes: 0.02 10*3/uL (ref 0.00–0.07)
Basophils Absolute: 0 10*3/uL (ref 0.0–0.1)
Basophils Relative: 0 %
Eosinophils Absolute: 0 10*3/uL (ref 0.0–0.5)
Eosinophils Relative: 0 %
HCT: 44.9 % (ref 39.0–52.0)
Hemoglobin: 15.1 g/dL (ref 13.0–17.0)
Immature Granulocytes: 0 %
Lymphocytes Relative: 6 %
Lymphs Abs: 0.4 10*3/uL — ABNORMAL LOW (ref 0.7–4.0)
MCH: 31.2 pg (ref 26.0–34.0)
MCHC: 33.6 g/dL (ref 30.0–36.0)
MCV: 92.8 fL (ref 80.0–100.0)
Monocytes Absolute: 0.4 10*3/uL (ref 0.1–1.0)
Monocytes Relative: 6 %
Neutro Abs: 6.5 10*3/uL (ref 1.7–7.7)
Neutrophils Relative %: 88 %
Platelets: 105 10*3/uL — ABNORMAL LOW (ref 150–400)
RBC: 4.84 MIL/uL (ref 4.22–5.81)
RDW: 13.9 % (ref 11.5–15.5)
WBC: 7.4 10*3/uL (ref 4.0–10.5)
nRBC: 0 % (ref 0.0–0.2)

## 2019-04-24 LAB — RAPID URINE DRUG SCREEN, HOSP PERFORMED
Amphetamines: NOT DETECTED
Barbiturates: NOT DETECTED
Benzodiazepines: NOT DETECTED
Cocaine: NOT DETECTED
Opiates: NOT DETECTED
Tetrahydrocannabinol: NOT DETECTED

## 2019-04-24 LAB — ETHANOL: Alcohol, Ethyl (B): <10 mg/dL (ref <10)

## 2019-04-24 LAB — GLUCOSE, CAPILLARY
Glucose-Capillary: 150 mg/dL — ABNORMAL HIGH (ref 70–99)
Glucose-Capillary: 141 mg/dL — ABNORMAL HIGH (ref 70–99)

## 2019-04-24 LAB — TSH: TSH: 0.599 u[IU]/mL (ref 0.350–4.500)

## 2019-04-24 LAB — SARS CORONAVIRUS 2 (TAT 6-24 HRS): SARS Coronavirus 2: NEGATIVE

## 2019-04-24 LAB — VITAMIN D 25 HYDROXY (VIT D DEFICIENCY, FRACTURES): Vit D, 25-Hydroxy: 37.97 ng/mL (ref 30–100)

## 2019-04-24 MED ORDER — THIAMINE HCL 100 MG PO TABS: 100.0000 mg | ORAL_TABLET | Freq: Every day | ORAL | Status: DC

## 2019-04-24 MED ORDER — THIAMINE HCL 100 MG/ML IJ SOLN: 100.0000 mg | Freq: Every day | INTRAMUSCULAR | Status: DC

## 2019-04-24 MED ORDER — SODIUM CHLORIDE 0.9 % IV SOLN: 250.0000 mL | INTRAVENOUS | Status: DC | PRN

## 2019-04-24 MED ORDER — LORAZEPAM 2 MG/ML IJ SOLN
1.0000 mg | INTRAMUSCULAR | Status: AC | PRN
  Administered 2019-04-24: 2 mg via INTRAVENOUS
  Administered 2019-04-24 (×2): 3 mg via INTRAVENOUS
  Administered 2019-04-26: 2 mg via INTRAVENOUS
  Filled 2019-04-24 (×3): qty 1
  Filled 2019-04-24: qty 2
  Filled 2019-04-24: qty 1

## 2019-04-24 MED ORDER — LORAZEPAM 2 MG/ML IJ SOLN
0.0000 mg | Freq: Four times a day (QID) | INTRAMUSCULAR | Status: DC
  Administered 2019-04-24 – 2019-04-26 (×5): 2 mg via INTRAVENOUS
  Filled 2019-04-24 (×3): qty 1
  Filled 2019-04-24: qty 2
  Filled 2019-04-24 (×2): qty 1

## 2019-04-24 MED ORDER — LORAZEPAM 1 MG PO TABS: 0.0000 mg | ORAL_TABLET | Freq: Two times a day (BID) | ORAL | Status: DC

## 2019-04-24 MED ORDER — FOLIC ACID 1 MG PO TABS
1.0000 mg | ORAL_TABLET | Freq: Every day | ORAL | Status: DC
  Administered 2019-04-26 – 2019-05-01 (×6): 1 mg via ORAL
  Filled 2019-04-24 (×6): qty 1

## 2019-04-24 MED ORDER — LORAZEPAM 1 MG PO TABS: 0.0000 mg | ORAL_TABLET | Freq: Four times a day (QID) | ORAL | Status: DC

## 2019-04-24 MED ORDER — BISACODYL 5 MG PO TBEC: 5.0000 mg | DELAYED_RELEASE_TABLET | Freq: Every day | ORAL | Status: DC | PRN

## 2019-04-24 MED ORDER — ONDANSETRON HCL 4 MG/5ML PO SOLN: 4.0000 mg | Freq: Once | ORAL | Status: DC

## 2019-04-24 MED ORDER — PANTOPRAZOLE SODIUM 40 MG IV SOLR
40.0000 mg | INTRAVENOUS | Status: DC
  Administered 2019-04-24 – 2019-04-26 (×3): 40 mg via INTRAVENOUS
  Filled 2019-04-24 (×3): qty 40

## 2019-04-24 MED ORDER — LORAZEPAM 1 MG PO TABS
1.0000 mg | ORAL_TABLET | ORAL | Status: AC | PRN
  Filled 2019-04-24: qty 2

## 2019-04-24 MED ORDER — SODIUM CHLORIDE 0.9% FLUSH
3.0000 mL | Freq: Two times a day (BID) | INTRAVENOUS | Status: DC
  Administered 2019-04-24 – 2019-05-01 (×10): 3 mL via INTRAVENOUS

## 2019-04-24 MED ORDER — INSULIN ASPART 100 UNIT/ML ~~LOC~~ SOLN: 0.0000 [IU] | Freq: Three times a day (TID) | SUBCUTANEOUS | Status: DC

## 2019-04-24 MED ORDER — ONDANSETRON HCL 4 MG PO TABS: 4.0000 mg | ORAL_TABLET | Freq: Four times a day (QID) | ORAL | Status: DC | PRN

## 2019-04-24 MED ORDER — ONDANSETRON HCL 4 MG/2ML IJ SOLN
INTRAMUSCULAR | Status: AC
  Administered 2019-04-24: 4 mg
  Filled 2019-04-24: qty 2

## 2019-04-24 MED ORDER — ADULT MULTIVITAMIN W/MINERALS CH
1.0000 | ORAL_TABLET | Freq: Every day | ORAL | Status: DC
  Administered 2019-04-26 – 2019-05-01 (×6): 1 via ORAL
  Filled 2019-04-24 (×7): qty 1

## 2019-04-24 MED ORDER — LORAZEPAM 2 MG/ML IJ SOLN
0.0000 mg | Freq: Two times a day (BID) | INTRAMUSCULAR | Status: DC
  Administered 2019-04-26 (×2): 2 mg via INTRAVENOUS
  Administered 2019-04-27: 1 mg via INTRAVENOUS
  Filled 2019-04-24 (×4): qty 1

## 2019-04-24 MED ORDER — METOPROLOL TARTRATE 5 MG/5ML IV SOLN
2.5000 mg | Freq: Four times a day (QID) | INTRAVENOUS | Status: DC
  Administered 2019-04-24 – 2019-04-26 (×7): 2.5 mg via INTRAVENOUS
  Filled 2019-04-24 (×9): qty 5

## 2019-04-24 MED ORDER — LORAZEPAM 2 MG/ML IJ SOLN: 0.0000 mg | Freq: Two times a day (BID) | INTRAMUSCULAR | Status: DC

## 2019-04-24 MED ORDER — HALOPERIDOL LACTATE 5 MG/ML IJ SOLN
2.0000 mg | Freq: Four times a day (QID) | INTRAMUSCULAR | Status: DC | PRN
  Administered 2019-04-27 – 2019-04-30 (×2): 2 mg via INTRAVENOUS
  Filled 2019-04-24 (×2): qty 1

## 2019-04-24 MED ORDER — SODIUM CHLORIDE 0.9 % IV SOLN
INTRAVENOUS | Status: DC
  Administered 2019-04-26: 50 mL/h via INTRAVENOUS

## 2019-04-24 MED ORDER — ONDANSETRON HCL 4 MG/2ML IJ SOLN: 4.0000 mg | Freq: Four times a day (QID) | INTRAMUSCULAR | Status: DC | PRN

## 2019-04-24 MED ORDER — THIAMINE HCL 100 MG/ML IJ SOLN
100.0000 mg | Freq: Every day | INTRAMUSCULAR | Status: DC
  Administered 2019-04-24 – 2019-04-25 (×2): 100 mg via INTRAVENOUS
  Filled 2019-04-24 (×3): qty 2

## 2019-04-24 MED ORDER — LORAZEPAM 2 MG/ML IJ SOLN
0.0000 mg | Freq: Four times a day (QID) | INTRAMUSCULAR | Status: DC
  Administered 2019-04-24 (×2): 2 mg via INTRAVENOUS
  Filled 2019-04-24 (×2): qty 1

## 2019-04-24 MED ORDER — THIAMINE HCL 100 MG PO TABS
100.0000 mg | ORAL_TABLET | Freq: Every day | ORAL | Status: DC
  Administered 2019-04-26 – 2019-05-01 (×6): 100 mg via ORAL
  Filled 2019-04-24 (×7): qty 1

## 2019-04-24 MED ORDER — SODIUM CHLORIDE 0.9% FLUSH: 3.0000 mL | INTRAVENOUS | Status: DC | PRN

## 2019-04-24 NOTE — ED Triage Notes (Signed)
Pt has not had alcohol for a day and a half according to son. Pt has had headache since he has not had alcohol. Nausea and vomiting started two hours ago. Pt family called EMS because patient was telling his son that he "I feel so bad like I am going to die".

## 2019-04-24 NOTE — H&P (Signed)
History and Physical  [facility]  Jon Bennett MWN:[id number] DOB: 06-29-1934 DOA: 04/24/2019  PCP: [name], MD   Patient coming from: Home   I have personally briefly reviewed patient's old medical records in [facility] Link  Chief Complaint: I feel so bad like I am going to die  Historian: Pt's family, ED records, patient medicated and unable to give history at this time  HPI: Jon Bennett is a 85 y.o. male with medical history significant for long history of heavy alcohol consumption, chronic alcoholic pancreatitis, diabetes mellitus, GERD, hypertension and peripheral vascular disease reportedly consumes half a pint of whiskey daily.  He has not had alcohol in the last 2 days.  He told his family members that he felt like he was going to die.  He started having tremors.  They brought him to the emergency department by EMS for evaluation.  He has been complaining of headaches.  He is also having nausea and vomiting that started about 2 hours prior to arrival.  He has been agitated and fidgeting.  The patient's family says that he is in denial about his alcohol problem.  ED Course: The patient was noted to be highly agitated with a CIWA score of 20 on arrival.  His ethanol level was less than 10.  His metabolic panel and CBC were mostly unremarkable with the exception of low platelets at 105.  His glucose was mildly elevated at 163.  His TSH was normal.  His SARS 2 coronavirus test is pending.  His urinalysis showed trace leukocytes and many bacteria and 6-10 WBC per high-power field.  Urine drug screen was negative.  CT head with no acute abnormalities.  Started on the CIWA protocol and admission was requested.  Review of Systems: UTO due to condition.   Past Medical History:  Diagnosis Date  . Diabetes mellitus without complication (HCC)   . GERD (gastroesophageal reflux disease)   . Hypertension   . Pancreatitis, alcoholic, acute     Past Surgical History:  Procedure  Laterality Date  . VASCULAR SURGERY       reports that he has never smoked. He has never used smokeless tobacco. He reports current alcohol use of about 4.0 - 5.0 standard drinks of alcohol per week. He reports that he does not use drugs.  Allergies  Allergen Reactions  . Asa [Aspirin] Shortness Of Breath    History reviewed. No pertinent family history.   Prior to Admission medications   Medication Sig Start Date End Date Taking? Authorizing Provider  busPIRone (BUSPAR) 10 MG tablet Take 10 mg by mouth 2 (two) times daily.  07/11/18   [provider]  clopidogrel (PLAVIX) 75 MG tablet TAKE 1 TABLET BY MOUTH EVERY DAY 02/08/12   [provider]  famotidine (PEPCID) 20 MG tablet Take 20 mg by mouth 2 (two) times daily.  08/07/18   [provider]  metoprolol tartrate (LOPRESSOR) 25 MG tablet Take 1 tablet (25 mg total) by mouth 2 (two) times daily. 08/18/18   Catarina Hartshorn, MD  sertraline (ZOLOFT) 100 MG tablet Take 100 mg by mouth daily.  07/11/18   [provider]    Physical Exam: Vitals:   04/24/19 0500 04/24/19 0530 04/24/19 0600 04/24/19 0630  BP: 119/79 115/80  132/85  Pulse: 69 71    Resp: 20  20   Temp:      TempSrc:      SpO2: 99% 99%    Weight:  Height:        Constitutional: Pt somnolent from being medicated, easily arousable. NAD. Appears comfortable.  Eyes: PERRL, lids and conjunctivae normal ENMT: Mucous membranes are dry. Posterior pharynx clear of any exudate or lesions. Poor dentition.  Neck: normal, supple, no masses, no thyromegaly Respiratory:  Normal respiratory effort. No accessory muscle use.  Cardiovascular: Regular rate and rhythm, no murmurs / rubs / gallops. No extremity edema. 2+ pedal pulses. No carotid bruits.  Abdomen: no tenderness, no masses palpated. No hepatosplenomegaly. Bowel sounds positive.  Musculoskeletal: no clubbing / cyanosis. No joint deformity upper and lower extremities. Good ROM, no  contractures. Normal muscle tone.  Skin: no rashes, lesions, ulcers. No induration Neurologic: CN 2-12 grossly intact. Sensation intact, DTR normal. Strength 5/5 in all 4.  Psychiatric: somnolent.   Labs on Admission: I have personally reviewed following labs and imaging studies  CBC: Recent Labs  Lab 04/24/19 0105  WBC 7.4  NEUTROABS 6.5  HGB 15.1  HCT 44.9  MCV 92.8  PLT 105*   Basic Metabolic Panel: Recent Labs  Lab 04/24/19 0105  NA 140  K 3.8  CL 105  CO2 24  GLUCOSE 163*  BUN 18  CREATININE 1.03  CALCIUM 9.5   GFR: Estimated Creatinine Clearance: 55 mL/min (by C-G formula based on SCr of 1.03 mg/dL). Liver Function Tests: Recent Labs  Lab 04/24/19 0105  AST 22  ALT 14  ALKPHOS 72  BILITOT 1.1  PROT 7.2  ALBUMIN 4.4   No results for input(s): LIPASE, AMYLASE in the last 168 hours. No results for input(s): AMMONIA in the last 168 hours. Coagulation Profile: No results for input(s): INR, PROTIME in the last 168 hours. Cardiac Enzymes: No results for input(s): CKTOTAL, CKMB, CKMBINDEX, TROPONINI in the last 168 hours. BNP (last 3 results) No results for input(s): PROBNP in the last 8760 hours. HbA1C: No results for input(s): HGBA1C in the last 72 hours. CBG: No results for input(s): GLUCAP in the last 168 hours. Lipid Profile: No results for input(s): CHOL, HDL, LDLCALC, TRIG, CHOLHDL, LDLDIRECT in the last 72 hours. Thyroid Function Tests: No results for input(s): TSH, T4TOTAL, FREET4, T3FREE, THYROIDAB in the last 72 hours. Anemia Panel: No results for input(s): VITAMINB12, FOLATE, FERRITIN, TIBC, IRON, RETICCTPCT in the last 72 hours. Urine analysis:    Component Value Date/Time   COLORURINE YELLOW 08/13/2018 1735   APPEARANCEUR CLEAR 08/13/2018 1735   LABSPEC 1.021 08/13/2018 1735   PHURINE 5.0 08/13/2018 1735   GLUCOSEU NEGATIVE 08/13/2018 1735   HGBUR LARGE (A) 08/13/2018 1735   BILIRUBINUR NEGATIVE 08/13/2018 1735   KETONESUR 20 (A)  08/13/2018 1735   PROTEINUR NEGATIVE 08/13/2018 1735   NITRITE POSITIVE (A) 08/13/2018 1735   LEUKOCYTESUR SMALL (A) 08/13/2018 1735    Radiological Exams on Admission: CT Head Wo Contrast  Result Date: 04/24/2019 CLINICAL DATA:  ETOH withdrawal, altered mental status, tremors EXAM: CT HEAD WITHOUT CONTRAST TECHNIQUE: Contiguous axial images were obtained from the base of the skull through the vertex without intravenous contrast. COMPARISON:  CT head Aug 11, 2018 FINDINGS: Technical: Motion degraded imaging quality despite the use of multiple acquisitions. 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 No evidence of acute infarction, hemorrhage, hydrocephalus, extra-axial collection or mass lesion/mass effect. Symmetric prominence of the ventricles, cisterns and sulci compatible with parenchymal volume loss. Patchy areas of white matter hypoattenuation are most compatible with chronic microvascular angiopathy. Vascular: Atherosclerotic calcification of the carotid siphons. No  hyperdense vessel. Skull: Remote bilateral nasal bone deformities. No visible calvarial fracture within the limitations of this examination. No scalp swelling or hematoma. Sinuses/Orbits: Paranasal sinuses and mastoid air cells are predominantly clear. Other: None IMPRESSION: 1. Motion degraded imaging quality despite the use of multiple acquisitions. 2. No CT evidence of definite acute intracranial pathology. 3. 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 4. Features of chronic parenchymal volume loss and white matter angiopathy. Electronically Signed   By: Lovena Le M.D.   On: 04/24/2019 04:32   EKG: personally reviewed. Sinus tachycardia   Assessment/Plan Principal Problem:   Acute hyperactive alcohol withdrawal delirium (HCC) Active Problems:   Alcohol withdrawal (HCC)   Thrombocytopenia (HCC)   Diabetes mellitus without  complication (HCC)   Hypertension   GERD (gastroesophageal reflux disease)   1. Acute alcohol withdrawal-the patient presented with a very high CIWA score greater than 20.  He has been started on the CIWA protocol and his score is coming down.  Continue supportive care as ordered.  Continue vitamin supplementation.  Continue following closely.  IV hydration ordered.  Haldol ordered for severe agitation.  Monitor on telemetry.  If symptoms worsen with transfer to stepdown ICU and start IV Precedex infusion.  Aspiration precautions.   2. Chronic alcoholism-when patient medically stabilized which has Education officer, museum to see patient for consideration of alcohol treatment program. 3. Thrombocytopenia-likely secondary to liver damage from chronic alcoholism.  Holding heparin products.  Follow. 4. GERD-Protonix ordered for GI protection. 5. Diabetes mellitus type 2 - mostly diet controlled, will follow CBG and sensitive SSI only if needed.   DVT prophylaxis: SCD  Code Status: Full   Family Communication: telephone update   Disposition Plan: from home, may require rehab prior to returning home, not medically stable to DC as he is actively withdrawing  Consults called:   Admission status: INP   Kassadie Pancake MD [facility] How to contact the [facility] Attending or Consulting provider [place] or covering provider during after hours [place], for this patient?  1. Check the care team in [facility] and look for a) attending/consulting TR[facility] provider listed and b) the [facility] team listed 2. Log into [email or web address] and use [place]'s universal password to access. If you do not have the password, please contact the hospital operator. 3. Locate the [facility] provider you are looking for under [facility] and page to a number that you can be directly reached. 4. If you still have difficulty reaching the provider, please page the [facility] (Director on Call) for the Hospitalists listed on amion for assistance.   If 7PM-7AM,  please contact night-coverage [email or web address] Password [facility]  04/24/2019, 7:35 AM

## 2019-04-24 NOTE — ED Notes (Signed)
Pt is currently resting in bed at this time

## 2019-04-24 NOTE — ED Notes (Addendum)
Pt very agitated and trying to get out of bed. Stating he is feeling very nauseated and needs to sit up to vomit. Pt given zofran 4mg  IV, now patient is laying back and resting. Will reassess CIWA in half and hour. EDP notified.

## 2019-04-24 NOTE — ED Provider Notes (Signed)
[facility] EMERGENCY DEPARTMENT Provider Note   CSN: [id number] Arrival date & time: 04/24/19  0043   Time seen 1:50 AM  History Chief Complaint  Patient presents with  . Alcohol Intoxication   Level 5 caveat for age  Jon Bennett is a 85 y.o. male.  HPI Patient presents via EMS.  Family states he drinks daily although patient does not always admit that.  The son stated the patient had not had alcohol in a day and a half.  Patient was complaining of alcohol and started having nausea and vomiting about 2 hours prior to arrival.  Patient was restless when he first arrived in the ED.  PCP [name], MD     Past Medical History:  Diagnosis Date  . Diabetes mellitus without complication (HCC)   . GERD (gastroesophageal reflux disease)   . Hypertension   . Pancreatitis, alcoholic, acute     Patient Active Problem List   Diagnosis Date Noted  . Thrombocytopenia (HCC) 08/16/2018  . Alcohol withdrawal (HCC) 08/13/2018    Past Surgical History:  Procedure Laterality Date  . VASCULAR SURGERY         History reviewed. No pertinent family history.  Social History   Tobacco Use  . Smoking status: Never Smoker  . Smokeless tobacco: Never Used  Substance Use Topics  . Alcohol use: Yes    Alcohol/week: 4.0 - 5.0 standard drinks    Types: 4 - 5 Shots of liquor per week    Comment: "half a pint of whiskey daily"  . Drug use: Never  lives at home  Home Medications Prior to Admission medications   Medication Sig Start Date End Date Taking? Authorizing Provider  busPIRone (BUSPAR) 10 MG tablet Take 10 mg by mouth 2 (two) times daily.  07/11/18   [provider]  clopidogrel (PLAVIX) 75 MG tablet TAKE 1 TABLET BY MOUTH EVERY DAY 02/08/12   [provider]  famotidine (PEPCID) 20 MG tablet Take 20 mg by mouth 2 (two) times daily.  08/07/18   [provider]  metoprolol tartrate (LOPRESSOR) 25 MG tablet Take 1 tablet (25 mg total) by mouth 2 (two)  times daily. 08/18/18   Catarina Hartshorn, MD  sertraline (ZOLOFT) 100 MG tablet Take 100 mg by mouth daily.  07/11/18   [provider]    Allergies    Asa [aspirin]  Review of Systems   Review of Systems  All other systems reviewed and are negative.   Physical Exam Updated Vital Signs BP 119/79   Pulse 69   Temp 98.8 F (37.1 C) (Oral)   Resp 20   Ht 5\' 9"  (1.753 m)   Wt 70.3 kg   SpO2 99%   BMI 22.89 kg/m   Physical Exam Vitals and nursing note reviewed.  Constitutional:      Appearance: He is normal weight.     Comments: Patient was shaking and having tremors  HENT:     Head: Normocephalic and atraumatic.     Right Ear: External ear normal.     Left Ear: External ear normal.  Eyes:     Extraocular Movements: Extraocular movements intact.     Conjunctiva/sclera: Conjunctivae normal.     Pupils: Pupils are equal, round, and reactive to light.  Cardiovascular:     Rate and Rhythm: Regular rhythm. Tachycardia present.  Pulmonary:     Effort: Pulmonary effort is normal. No respiratory distress.  Musculoskeletal:        General: No  deformity. Normal range of motion.     Cervical back: Normal range of motion.  Skin:    General: Skin is warm and dry.  Neurological:     General: No focal deficit present.     Mental Status: He is alert.     Cranial Nerves: No cranial nerve deficit.     Comments: Moves all extremities well  Psychiatric:        Mood and Affect: Mood is anxious.     ED Results / Procedures / Treatments   Labs (all labs ordered are listed, but only abnormal results are displayed) Results for orders placed or performed during the hospital encounter of 04/24/19  Comprehensive metabolic panel  Result Value Ref Range   Sodium 140 135 - 145 mmol/L   Potassium 3.8 3.5 - 5.1 mmol/L   Chloride 105 98 - 111 mmol/L   CO2 24 22 - 32 mmol/L   Glucose, Bld 163 (H) 70 - 99 mg/dL   BUN 18 8 - 23 mg/dL   Creatinine, Ser 1.03 0.61 - 1.24 mg/dL   Calcium 9.5  8.9 - 10.3 mg/dL   Total Protein 7.2 6.5 - 8.1 g/dL   Albumin 4.4 3.5 - 5.0 g/dL   AST 22 15 - 41 U/L   ALT 14 0 - 44 U/L   Alkaline Phosphatase 72 38 - 126 U/L   Total Bilirubin 1.1 0.3 - 1.2 mg/dL   GFR calc non Af Amer >60 >60 mL/min   GFR calc Af Amer >60 >60 mL/min   Anion gap 11 5 - 15  Ethanol  Result Value Ref Range   Alcohol, Ethyl (B) <10 <10 mg/dL  CBC with Differential  Result Value Ref Range   WBC 7.4 4.0 - 10.5 K/uL   RBC 4.84 4.22 - 5.81 MIL/uL   Hemoglobin 15.1 13.0 - 17.0 g/dL   HCT 44.9 39.0 - 52.0 %   MCV 92.8 80.0 - 100.0 fL   MCH 31.2 26.0 - 34.0 pg   MCHC 33.6 30.0 - 36.0 g/dL   RDW 13.9 11.5 - 15.5 %   Platelets 105 (L) 150 - 400 K/uL   nRBC 0.0 0.0 - 0.2 %   Neutrophils Relative % 88 %   Neutro Abs 6.5 1.7 - 7.7 K/uL   Lymphocytes Relative 6 %   Lymphs Abs 0.4 (L) 0.7 - 4.0 K/uL   Monocytes Relative 6 %   Monocytes Absolute 0.4 0.1 - 1.0 K/uL   Eosinophils Relative 0 %   Eosinophils Absolute 0.0 0.0 - 0.5 K/uL   Basophils Relative 0 %   Basophils Absolute 0.0 0.0 - 0.1 K/uL   Immature Granulocytes 0 %   Abs Immature Granulocytes 0.02 0.00 - 0.07 K/uL   Laboratory interpretation all normal except hyperglycemia, mild thrombocytopenia consistent with alcohol use    EKG None   ED ECG REPORT   Date: 04/24/2019  Rate: 103  Rhythm: sinus tachycardia  QRS Axis: normal  Intervals: PR prolonged  ST/T Wave abnormalities: normal  Conduction Disutrbances:none  Narrative Interpretation: Q waves septally  Old EKG Reviewed: none available  I have personally reviewed the EKG tracing and agree with the computerized printout as noted.   Radiology CT Head Wo Contrast  Result Date: 04/24/2019 CLINICAL DATA:  ETOH withdrawal, altered mental status, tremors EXAM: CT HEAD WITHOUT CONTRAST TECHNIQUE: Contiguous axial images were obtained from the base of the skull through the vertex without intravenous contrast. COMPARISON:  CT head Aug 11, 2018 FINDINGS:  Technical: Motion degraded imaging quality despite the use of multiple acquisitions. 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 No evidence of acute infarction, hemorrhage, hydrocephalus, extra-axial collection or mass lesion/mass effect. Symmetric prominence of the ventricles, cisterns and sulci compatible with parenchymal volume loss. Patchy areas of white matter hypoattenuation are most compatible with chronic microvascular angiopathy. Vascular: Atherosclerotic calcification of the carotid siphons. No hyperdense vessel. Skull: Remote bilateral nasal bone deformities. No visible calvarial fracture within the limitations of this examination. No scalp swelling or hematoma. Sinuses/Orbits: Paranasal sinuses and mastoid air cells are predominantly clear. Other: None IMPRESSION: 1. Motion degraded imaging quality despite the use of multiple acquisitions. 2. No CT evidence of definite acute intracranial pathology. 3. 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 4. Features of chronic parenchymal volume loss and white matter angiopathy. Electronically Signed   By: Kreg Shropshire M.D.   On: 04/24/2019 04:32    Procedures .Critical Care Performed by: Devoria Albe, MD Authorized by: Devoria Albe, MD   Critical care provider statement:    Critical care time (minutes):  28   Critical care was necessary to treat or prevent imminent or life-threatening deterioration of the following conditions:  CNS failure or compromise   Critical care was time spent personally by me on the following activities:  Discussions with consultants, examination of patient, obtaining history from patient or surrogate, ordering and review of radiographic studies, ordering and review of laboratory studies, re-evaluation of patient's condition, pulse oximetry and review of old charts   (including critical care time)  Medications Ordered in  ED Medications  LORazepam (ATIVAN) injection 0-4 mg (2 mg Intravenous Given 04/24/19 0124)    Or  LORazepam (ATIVAN) tablet 0-4 mg ( Oral See Alternative 04/24/19 0124)  LORazepam (ATIVAN) injection 0-4 mg (has no administration in time range)    Or  LORazepam (ATIVAN) tablet 0-4 mg (has no administration in time range)  thiamine tablet 100 mg (has no administration in time range)    Or  thiamine (B-1) injection 100 mg (has no administration in time range)  ondansetron (ZOFRAN) 4 MG/5ML solution 4 mg (0 mg Oral Hold 04/24/19 0355)  ondansetron (ZOFRAN) 4 MG/2ML injection (4 mg  Given 04/24/19 2130)    ED Course  I have reviewed the triage vital signs and the nursing notes.  Pertinent labs & imaging results that were available during my care of the patient were reviewed by me and considered in my medical decision making (see chart for details).    MDM Rules/Calculators/A&P                       Pt's initial CIWA score was 20, he was given ativan 2 mg IV.  Recheck at 3:10 AM patient is awake and agitated, he states he is nauseated.  He is trying to sit up in the bed, we were trying to have him lay on his side and he was given Zofran 4 mg IV.  Patient calmed down after the Zofran.  After patient was still for a while CT of the head was attempted.  There was no acute changes noted.  Due to patient's age and his very high CIWA score I felt patient should be admitted to the hospital for his alcohol withdrawal.  Review his chart shows he was admitted in June 2020 for alcohol withdrawal.  5:08 AM patient discussed with Dr. Sharl Ma, hospitalist.  He  will admit.  Final Clinical Impression(s) / ED Diagnoses Final diagnoses:  Alcohol withdrawal syndrome with complication [facility])    Rx / DC Orders  Plan admission  Devoria Albe, MD, Concha Pyo, MD 04/24/19 [phone]

## 2019-04-24 NOTE — ED Notes (Signed)
Pt has tremors on assessment. Unable to sit still, but able to answer questions. Pt states he has not drank alcohol in a "Elysia Grand time" but according to family he is a heavy drinker/"sociable drinker". Pt agrees that he drinks half a pint of liquor a day, then immediately denies it saying "oh I don't drink that much anymore" Pt family states pt is in denial about alcohol problem.

## 2019-04-25 ENCOUNTER — Inpatient Hospital Stay (HOSPITAL_COMMUNITY): Payer: Medicare Other

## 2019-04-25 LAB — COMPREHENSIVE METABOLIC PANEL
ALT: 13 U/L (ref 0–44)
AST: 20 U/L (ref 15–41)
Albumin: 4 g/dL (ref 3.5–5.0)
Alkaline Phosphatase: 64 U/L (ref 38–126)
Anion gap: 9 (ref 5–15)
BUN: 19 mg/dL (ref 8–23)
CO2: 26 mmol/L (ref 22–32)
Calcium: 9.3 mg/dL (ref 8.9–10.3)
Chloride: 106 mmol/L (ref 98–111)
Creatinine, Ser: 0.7 mg/dL (ref 0.61–1.24)
GFR calc Af Amer: >60 mL/min (ref >60)
GFR calc non Af Amer: >60 mL/min (ref >60)
Glucose, Bld: 141 mg/dL — ABNORMAL HIGH (ref 70–99)
Potassium: 4.2 mmol/L (ref 3.5–5.1)
Sodium: 141 mmol/L (ref 135–145)
Total Bilirubin: 1.2 mg/dL (ref 0.3–1.2)
Total Protein: 6.9 g/dL (ref 6.5–8.1)

## 2019-04-25 LAB — CBC
HCT: 49.4 % (ref 39.0–52.0)
Hemoglobin: 16.6 g/dL (ref 13.0–17.0)
MCH: 31.3 pg (ref 26.0–34.0)
MCHC: 33.6 g/dL (ref 30.0–36.0)
MCV: 93.2 fL (ref 80.0–100.0)
Platelets: 88 10*3/uL — ABNORMAL LOW (ref 150–400)
RBC: 5.3 MIL/uL (ref 4.22–5.81)
RDW: 13.8 % (ref 11.5–15.5)
WBC: 11.7 10*3/uL — ABNORMAL HIGH (ref 4.0–10.5)
nRBC: 0 % (ref 0.0–0.2)

## 2019-04-25 LAB — MAGNESIUM: Magnesium: 2.1 mg/dL (ref 1.7–2.4)

## 2019-04-25 LAB — HEMOGLOBIN A1C
Hgb A1c MFr Bld: 5.5 % (ref 4.8–5.6)
Mean Plasma Glucose: 111 mg/dL

## 2019-04-25 LAB — GLUCOSE, CAPILLARY
Glucose-Capillary: 130 mg/dL — ABNORMAL HIGH (ref 70–99)
Glucose-Capillary: 122 mg/dL — ABNORMAL HIGH (ref 70–99)
Glucose-Capillary: 114 mg/dL — ABNORMAL HIGH (ref 70–99)

## 2019-04-25 LAB — PHOSPHORUS: Phosphorus: 2 mg/dL — ABNORMAL LOW (ref 2.5–4.6)

## 2019-04-25 NOTE — Progress Notes (Signed)
PROGRESS NOTE    Jon Bennett  OAC:[id number] DOB: [birth year] DOA: 04/24/2019 PCP: [name], MD   Brief Narrative:  Per HPI: Jon Bennett is a 85 y.o. male with medical history significant for long history of heavy alcohol consumption, chronic alcoholic pancreatitis, diabetes mellitus, GERD, hypertension and peripheral vascular disease reportedly consumes half a pint of whiskey daily.  He has not had alcohol in the last 2 days.  He told his family members that he felt like he was going to die.  He started having tremors.  They brought him to the emergency department by EMS for evaluation.  He has been complaining of headaches.  He is also having nausea and vomiting that started about 2 hours prior to arrival.  He has been agitated and fidgeting.  The patient's family says that he is in denial about his alcohol problem.  2/10: Patient was admitted with acute alcohol withdrawal symptoms with elevated CIWA protocol.  He was given some Haldol and IV hydration as needed.  He appears somnolent this morning and otherwise nonconversational.  Son is at bedside and concerned about his chronic drinking issue and feels he may need some rehabilitation.  Will have PT evaluate him in the morning once more alert.  Assessment & Plan:   Principal Problem:   Acute hyperactive alcohol withdrawal delirium (HCC) Active Problems:   Alcohol withdrawal (HCC)   Thrombocytopenia (HCC)   Diabetes mellitus without complication (HCC)   Hypertension   GERD (gastroesophageal reflux disease)   1. Acute alcohol withdrawal-the patient presented with a very high CIWA score greater than 20.    He appears to not be having any further withdrawal symptoms and his vital signs are stable.  He appears to be more somnolent from Ativan that was given at 2100 on 2/9.  Will avoid further doses and monitor for increased alertness.  PT evaluation in a.m. for debility/weakness and may require rehab on discharge. 2. Chronic  alcoholism-when patient medically stabilized which has Child psychotherapist to see patient for consideration of alcohol treatment program. 3. Thrombocytopenia-likely secondary to liver damage from chronic alcoholism.  Holding heparin products.  Follow.  Repeat CBC in a.m. 4. GERD-Protonix ordered for GI protection. 5. Diabetes mellitus type 2 - mostly diet controlled, will follow CBG and sensitive SSI only if needed.    DVT prophylaxis: SCDs Code Status: Full Family Communication: Son, Jon Leriche at bedside Disposition Plan: Continue current management and monitor closely with repeat labs in a.m.  SLP evaluation for swallow difficulty.  PT evaluation in a.m. with likely need for SNF on discharge.   Consultants:   None  Procedures:   None  Antimicrobials:  Anti-infectives (From admission, onward)   None       Subjective: Patient seen and evaluated today with no new acute complaints or concerns. No acute concerns or events noted overnight.  Objective: Vitals:   04/24/19 1013 04/24/19 1100 04/25/19 0012 04/25/19 0556  BP: (!) 160/97 (!) 160/97 (!) 142/89 (!) 153/80  Pulse: 88 88 73 72  Resp: 19  20   Temp:   (!) 97.5 F (36.4 C) 98.2 F (36.8 C)  TempSrc:   Oral Axillary  SpO2: 97%  98% 98%  Weight:  69.3 kg    Height:  5\' 9"  (1.753 m)      Intake/Output Summary (Last 24 hours) at 04/25/2019 1115 Last data filed at 04/25/2019 0900 Gross per 24 hour  Intake 186.58 ml  Output 500 ml  Net -313.42 ml   06/23/2019  Weights   04/24/19 0054 04/24/19 1100  Weight: 70.3 kg 69.3 kg    Examination:  General exam: Appears somnolent, but arousable Respiratory system: Clear to auscultation. Respiratory effort normal. Cardiovascular system: S1 & S2 heard, RRR. No JVD, murmurs, rubs, gallops or clicks. No pedal edema. Gastrointestinal system: Abdomen is nondistended, soft and nontender. No organomegaly or masses felt. Normal bowel sounds heard. Central nervous system: Somnolent, but arousable  Extremities: Cannot be assessed Skin: No rashes, lesions or ulcers Psychiatry: Cannot be assessed    Data Reviewed: I have personally reviewed following labs and imaging studies  CBC: Recent Labs  Lab 04/24/19 0105 04/25/19 0526  WBC 7.4 11.7*  NEUTROABS 6.5  --   HGB 15.1 16.6  HCT 44.9 49.4  MCV 92.8 93.2  PLT 105* 88*   Basic Metabolic Panel: Recent Labs  Lab 04/24/19 0105 04/25/19 0526  NA 140 141  K 3.8 4.2  CL 105 106  CO2 24 26  GLUCOSE 163* 141*  BUN 18 19  CREATININE 1.03 0.70  CALCIUM 9.5 9.3  MG  --  2.1  PHOS  --  2.0*   GFR: Estimated Creatinine Clearance: 69.8 mL/min (by C-G formula based on SCr of 0.7 mg/dL). Liver Function Tests: Recent Labs  Lab 04/24/19 0105 04/25/19 0526  AST 22 20  ALT 14 13  ALKPHOS 72 64  BILITOT 1.1 1.2  PROT 7.2 6.9  ALBUMIN 4.4 4.0   No results for input(s): LIPASE, AMYLASE in the last 168 hours. No results for input(s): AMMONIA in the last 168 hours. Coagulation Profile: No results for input(s): INR, PROTIME in the last 168 hours. Cardiac Enzymes: No results for input(s): CKTOTAL, CKMB, CKMBINDEX, TROPONINI in the last 168 hours. BNP (last 3 results) No results for input(s): PROBNP in the last 8760 hours. HbA1C: Recent Labs    04/24/19 0113  HGBA1C 5.5   CBG: Recent Labs  Lab 04/24/19 1107 04/24/19 1605 04/25/19 0736  GLUCAP 150* 141* 130*   Lipid Profile: No results for input(s): CHOL, HDL, LDLCALC, TRIG, CHOLHDL, LDLDIRECT in the last 72 hours. Thyroid Function Tests: Recent Labs    04/24/19 0935  TSH 0.599   Anemia Panel: No results for input(s): VITAMINB12, FOLATE, FERRITIN, TIBC, IRON, RETICCTPCT in the last 72 hours. Sepsis Labs: No results for input(s): PROCALCITON, LATICACIDVEN in the last 168 hours.  Recent Results (from the past 240 hour(s))  SARS CORONAVIRUS 2 (TAT 6-24 HRS) Nasopharyngeal Nasopharyngeal Swab     Status: None   Collection Time: 04/24/19  5:09 AM   Specimen:  Nasopharyngeal Swab  Result Value Ref Range Status   SARS Coronavirus 2 NEGATIVE NEGATIVE Final    Comment: (NOTE) SARS-CoV-2 target nucleic acids are NOT DETECTED. The SARS-CoV-2 RNA is generally detectable in upper and lower respiratory specimens during the acute phase of infection. Negative results do not preclude SARS-CoV-2 infection, do not rule out co-infections with other pathogens, and should not be used as the sole basis for treatment or other patient management decisions. Negative results must be combined with clinical observations, patient history, and epidemiological information. The expected result is Negative. Fact Sheet for Patients: HairSlick.no Fact Sheet for Healthcare Providers: quierodirigir.com This test is not yet approved or cleared by the [place] FDA and  has been authorized for detection and/or diagnosis of SARS-CoV-2 by FDA under an Emergency Use Authorization (EUA). This EUA will remain  in effect (meaning this test can be used) for the duration of the COVID-19 declaration under Section  56 4(b)(1) of the Act, 21 U.S.C. section 360bbb-3(b)(1), unless the authorization is terminated or revoked sooner. Performed at [facility], [place] [address], [place], [place] [address]          Radiology Studies: CT Head Wo Contrast  Result Date: 04/24/2019 CLINICAL DATA:  ETOH withdrawal, altered mental status, tremors EXAM: CT HEAD WITHOUT CONTRAST TECHNIQUE: Contiguous axial images were obtained from the base of the skull through the vertex without intravenous contrast. COMPARISON:  CT head Aug 11, 2018 FINDINGS: Technical: Motion degraded imaging quality despite the use of multiple acquisitions. 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 No evidence of acute infarction, hemorrhage, hydrocephalus, extra-axial collection or mass lesion/mass effect.  Symmetric prominence of the ventricles, cisterns and sulci compatible with parenchymal volume loss. Patchy areas of white matter hypoattenuation are most compatible with chronic microvascular angiopathy. Vascular: Atherosclerotic calcification of the carotid siphons. No hyperdense vessel. Skull: Remote bilateral nasal bone deformities. No visible calvarial fracture within the limitations of this examination. No scalp swelling or hematoma. Sinuses/Orbits: Paranasal sinuses and mastoid air cells are predominantly clear. Other: None IMPRESSION: 1. Motion degraded imaging quality despite the use of multiple acquisitions. 2. No CT evidence of definite acute intracranial pathology. 3. 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 4. Features of chronic parenchymal volume loss and white matter angiopathy. Electronically Signed   By: Lovena Le M.D.   On: 04/24/2019 04:32   Portable chest 1 View  Result Date: 04/25/2019 CLINICAL DATA:  Hyperactive alcohol withdrawal delirium EXAM: PORTABLE CHEST 1 VIEW COMPARISON:  Radiograph 06/01/2012 FINDINGS: Streaky opacities with low volumes favoring atelectasis. No consolidative opacity. No pneumothorax or visible effusion. The cardiomediastinal contours are unremarkable. Surgical clips present along the left hilum are similar to prior. No acute osseous or soft tissue abnormality. IMPRESSION: Streaky opacities with low volumes favoring atelectasis. No other acute cardiopulmonary abnormality. Electronically Signed   By: Lovena Le M.D.   On: 04/25/2019 04:54        Scheduled Meds: . folic acid  1 mg Oral Daily  . insulin aspart  0-6 Units Subcutaneous TID WC  . LORazepam  0-4 mg Intravenous Q6H   Followed by  . [START ON 04/26/2019] LORazepam  0-4 mg Intravenous Q12H  . metoprolol tartrate  2.5 mg Intravenous Q6H  . multivitamin with minerals  1 tablet Oral Daily  . pantoprazole (PROTONIX) IV  40 mg Intravenous Q24H   . sodium chloride flush  3 mL Intravenous Q12H  . thiamine  100 mg Oral Daily   Or  . thiamine  100 mg Intravenous Daily   Continuous Infusions: . sodium chloride    . sodium chloride 50 mL/hr at 04/25/19 0553     LOS: 1 day    Time spent: 30 minutes    Analleli Gierke Darleen Crocker, DO [facility] Pager [phone]  If 7PM-7AM, please contact night-coverage [email or web address] Password [email or web address] 04/25/2019, 11:15 AM

## 2019-04-25 NOTE — Progress Notes (Signed)
1200 dose of metoprolol held per order of MD Sherryll Burger due to current B/P 105/86. Heart rate 75. Pt currently sleeping.

## 2019-04-25 NOTE — Progress Notes (Signed)
SLP Cancellation Note  Patient Details Name: Franciscojavier Wronski MRN: [id number] DOB: 1935-01-10   Cancelled treatment:       Reason Eval/Treat Not Completed: Patient's level of consciousness; Pt just received Ativan prior to my arrival and is currently inappropriate for BSE. RN staff reports that Pt consumed puree and thin without incident at lunch. SLP will check back tomorrow AM.  Thank you,  Havery Moros, CCC-SLP [phone]    PORTER,DABNEY 04/25/2019, 2:55 PM

## 2019-04-26 LAB — CBC
HCT: 46.1 % (ref 39.0–52.0)
Hemoglobin: 14.8 g/dL (ref 13.0–17.0)
MCH: 31.5 pg (ref 26.0–34.0)
MCHC: 32.1 g/dL (ref 30.0–36.0)
MCV: 98.1 fL (ref 80.0–100.0)
Platelets: 92 10*3/uL — ABNORMAL LOW (ref 150–400)
RBC: 4.7 MIL/uL (ref 4.22–5.81)
RDW: 14.2 % (ref 11.5–15.5)
WBC: 8.9 10*3/uL (ref 4.0–10.5)
nRBC: 0 % (ref 0.0–0.2)

## 2019-04-26 LAB — GLUCOSE, CAPILLARY
Glucose-Capillary: 97 mg/dL (ref 70–99)
Glucose-Capillary: 81 mg/dL (ref 70–99)
Glucose-Capillary: 96 mg/dL (ref 70–99)

## 2019-04-26 LAB — BASIC METABOLIC PANEL
Anion gap: 7 (ref 5–15)
BUN: 17 mg/dL (ref 8–23)
CO2: 27 mmol/L (ref 22–32)
Calcium: 9.1 mg/dL (ref 8.9–10.3)
Chloride: 106 mmol/L (ref 98–111)
Creatinine, Ser: 0.72 mg/dL (ref 0.61–1.24)
GFR calc Af Amer: >60 mL/min (ref >60)
GFR calc non Af Amer: >60 mL/min (ref >60)
Glucose, Bld: 113 mg/dL — ABNORMAL HIGH (ref 70–99)
Potassium: 3.7 mmol/L (ref 3.5–5.1)
Sodium: 140 mmol/L (ref 135–145)

## 2019-04-26 LAB — MAGNESIUM: Magnesium: 2.1 mg/dL (ref 1.7–2.4)

## 2019-04-26 MED ORDER — PANTOPRAZOLE SODIUM 40 MG PO TBEC
40.0000 mg | DELAYED_RELEASE_TABLET | Freq: Every day | ORAL | Status: DC
  Administered 2019-04-27 – 2019-05-01 (×4): 40 mg via ORAL
  Filled 2019-04-26 (×5): qty 1

## 2019-04-26 MED ORDER — BUSPIRONE HCL 5 MG PO TABS
10.0000 mg | ORAL_TABLET | Freq: Two times a day (BID) | ORAL | Status: DC
  Administered 2019-04-26 (×2): 10 mg via ORAL
  Filled 2019-04-26 (×2): qty 2

## 2019-04-26 MED ORDER — CLOPIDOGREL BISULFATE 75 MG PO TABS
75.0000 mg | ORAL_TABLET | Freq: Every day | ORAL | Status: DC
  Administered 2019-04-26 – 2019-05-01 (×6): 75 mg via ORAL
  Filled 2019-04-26 (×6): qty 1

## 2019-04-26 MED ORDER — SERTRALINE HCL 50 MG PO TABS
100.0000 mg | ORAL_TABLET | Freq: Every day | ORAL | Status: DC
  Administered 2019-04-26 – 2019-05-01 (×6): 100 mg via ORAL
  Filled 2019-04-26 (×6): qty 2

## 2019-04-26 MED ORDER — METOPROLOL TARTRATE 50 MG PO TABS
25.0000 mg | ORAL_TABLET | Freq: Two times a day (BID) | ORAL | Status: DC
  Administered 2019-04-26 – 2019-05-01 (×10): 25 mg via ORAL
  Filled 2019-04-26 (×11): qty 1

## 2019-04-26 NOTE — Progress Notes (Signed)
SLP Cancellation Note  Patient Details Name: Jon Bennett MRN: [id number] DOB: 1934/09/21   Cancelled treatment:       Reason Eval/Treat Not Completed: Patient's level of consciousness; SLP arrived to complete BSE, however Pt recently had Ativan and is currently somnolent. Pt has been consuming puree textures and thin liquids without incident when ALERT per nursing staff. SLP will continue efforts.   Thank you,  Havery Moros, CCC-SLP [phone]    [name] 04/26/2019, 12:35 PM

## 2019-04-26 NOTE — TOC Initial Note (Addendum)
Transition of Care [facility]) - Initial/Assessment Note    Patient Details  Name: Tramell Piechota MRN: [id number] Date of Birth: [birth year]  Transition of Care [facility]) CM/SW Contact:    Ihor Gully, LCSW Phone Number: 04/26/2019, 11:40 AM  Clinical Narrative:                 Lauro Regulus, provided history. Admitted for acute hyperactive alcohol withdrawal delirium. Lives with son, [name]. Independent at baseline. Discussed PT consult pending and possible recommendations from evaluation. [name] indicates that patienet will need rehab if he is non ambulatory.  TOC will follow up with son, [name], after PT evaluation. PT eval is pending as patient is fatigue/sedated.  Expected Discharge Plan: [place] Barriers to Discharge: Continued Medical Work up   Patient Goals and CMS Choice Patient states their goals for this hospitalization and ongoing recovery are:: Patient can return home if he is ambulatory per son. CMS Medicare.gov Compare Post Acute Care list provided to:: Patient Choice offered to / list presented to : Adult Children  Expected Discharge Plan and Services Expected Discharge Plan: [place]       Living arrangements for the past 2 months: Single Family Home                                      Prior Living Arrangements/Services Living arrangements for the past 2 months: Single Family Home Lives with:: Adult Children Patient language and need for interpreter reviewed:: Yes Do you feel safe going back to the place where you live?: Yes      Need for Family Participation in Patient Care: Yes (Comment) Care giver support system in place?: Yes (comment) Current home services: (has a walker in the home, does not need to use it yet.) Criminal Activity/Legal Involvement Pertinent to Current Situation/Hospitalization: No - Comment as needed  Activities of Daily Living Home Assistive Devices/Equipment: None ADL Screening (condition at time of  admission) Patient's cognitive ability adequate to safely complete daily activities?: No Is the patient deaf or have difficulty hearing?: No Does the patient have difficulty seeing, even when wearing glasses/contacts?: No Does the patient have difficulty concentrating, remembering, or making decisions?: Yes Patient able to express need for assistance with ADLs?: No Does the patient have difficulty dressing or bathing?: No Independently performs ADLs?: No Communication: Independent Dressing (OT): Needs assistance Is this a change from baseline?: Change from baseline, expected to last <3days Grooming: Needs assistance Is this a change from baseline?: Change from baseline, expected to last <3 days Feeding: Independent Bathing: Needs assistance Is this a change from baseline?: Change from baseline, expected to last <3 days Toileting: Dependent Is this a change from baseline?: Change from baseline, expected to last <3 days In/Out Bed: Dependent Is this a change from baseline?: Change from baseline, expected to last <3 days Walks in Home: Needs assistance Is this a change from baseline?: Pre-admission baseline Does the patient have difficulty walking or climbing stairs?: Yes Weakness of Legs: Both Weakness of Arms/Hands: None  Permission Sought/Granted Permission sought to share information with : Family Supports          Permission granted to share info w Relationship: son, [name]     Emotional Assessment Appearance:: Appears stated age   Affect (typically observed): Unable to Assess Orientation: : Oriented to Self Alcohol / Substance Use: Alcohol Use Psych Involvement: No (comment)  Admission diagnosis:  Alcohol withdrawal syndrome with complication (HCC) [F10.239] Acute hyperactive alcohol withdrawal delirium (HCC) [F10.231] Patient Active Problem List   Diagnosis Date Noted  . Acute hyperactive alcohol withdrawal delirium (HCC) 04/24/2019  . Diabetes mellitus without  complication (HCC)   . Hypertension   . GERD (gastroesophageal reflux disease)   . Thrombocytopenia (HCC) 08/16/2018  . Alcohol withdrawal (HCC) 08/13/2018   PCP:  [name], MD Pharmacy:   [facility] [phone] - [place], [place] - [address] [place] [address] [place] [address] Phone: [phone] Fax: [phone]     Social Determinants of Health (SDOH) Interventions    Readmission Risk Interventions No flowsheet data found.

## 2019-04-26 NOTE — Progress Notes (Signed)
PROGRESS NOTE    Jon Bennett  LNL:[id number] DOB: July 01, 1934 DOA: 04/24/2019 PCP: Mertha Baars, MD   Brief Narrative:  Per HPI: Jon Bennett a 85 y.o.malewith medical history significantfor long history of heavy alcohol consumption, chronic alcoholic pancreatitis, diabetes mellitus, GERD, hypertension and peripheral vascular disease reportedly consumes half a pint of whiskey daily. He has not had alcohol in the last 2 days. He told his family members that he felt like he was going to die. He started having tremors. They brought him to the emergency department by EMS for evaluation. He has been complaining of headaches. He is also having nausea and vomiting that started about 2 hours prior to arrival. He has been agitated and fidgeting. The patient's family says that he is in denial about his alcohol problem.  2/10: Patient was admitted with acute alcohol withdrawal symptoms with elevated CIWA protocol.  He was given some Haldol and IV hydration as needed.  He appears somnolent this morning and otherwise nonconversational.  Son is at bedside and concerned about his chronic drinking issue and feels he may need some rehabilitation.  Will have PT evaluate him in the morning once more alert.  2/11: Patient continues to remain somewhat confused and somnolent this morning.  PT evaluation currently pending.  He has been started on dysphagia 1 diet.  Will transition IV metoprolol to oral and resume his home BuSpar and switch to oral Protonix today as well.  Anticipate discharge once mentation has further improved and patient has been evaluated by PT.  Assessment & Plan:   Principal Problem:   Acute hyperactive alcohol withdrawal delirium (HCC) Active Problems:   Alcohol withdrawal (HCC)   Thrombocytopenia (HCC)   Diabetes mellitus without complication (HCC)   Hypertension   GERD (gastroesophageal reflux disease)   1. Acute alcohol withdrawal-the patient presented with a very high  CIWA score greater than 20.   He appears to not be having any further withdrawal symptoms and his vital signs are stable.  He appears to be more somnolent from Ativan that was given at 2100 on 2/9.  Will avoid further doses and monitor for increased alertness.  PT evaluation pending.  Will resume home BuSpar for now. 2. Chronic alcoholism-when patient medically stabilized which has Education officer, museum to see patient for consideration of alcohol treatment program. 3. Thrombocytopenia-stable.  Likely secondary to liver damage from chronic alcoholism. Holding heparin products. Follow.  Repeat CBC in a.m. 4. GERD-Protonix ordered for GI protection. 5. Diabetes mellitustype 2 - mostly diet controlled, will follow CBG and sensitive SSI only if needed.   DVT prophylaxis: SCDs Code Status: Full Family Communication: Son, Jon Bennett at bedside, discussed on 2/10 Disposition Plan: Continue current management with PT evaluation pending.  Anticipate discharge once mentation improves further.   Consultants:   None  Procedures:   None  Antimicrobials:  Anti-infectives (From admission, onward)   None          Subjective: Patient seen and evaluated today and appears to be more alert and awake.  No acute overnight events noted.  Objective: Vitals:   04/25/19 1417 04/25/19 2133 04/26/19 0006 04/26/19 0632  BP: 140/83 132/86 (!) 152/97 (!) 153/93  Pulse: 88 100 84 76  Resp: 18 20 20    Temp: 98 F (36.7 C) 98.4 F (36.9 C) 98 F (36.7 C) 98 F (36.7 C)  TempSrc:  Oral Oral Oral  SpO2: 99% 94% 98% 99%  Weight:      Height:  Intake/Output Summary (Last 24 hours) at 04/26/2019 1209 Last data filed at 04/26/2019 0900 Gross per 24 hour  Intake 2144.07 ml  Output 300 ml  Net 1844.07 ml   Filed Weights   04/24/19 0054 04/24/19 1100  Weight: 70.3 kg 69.3 kg    Examination:  General exam: Appears calm and comfortable  Respiratory system: Clear to auscultation. Respiratory  effort normal. Cardiovascular system: S1 & S2 heard, RRR. No JVD, murmurs, rubs, gallops or clicks. No pedal edema. Gastrointestinal system: Abdomen is nondistended, soft and nontender. No organomegaly or masses felt. Normal bowel sounds heard. Central nervous system: More alert and awake. Extremities: No edema Skin: No rashes, lesions or ulcers Psychiatry: Cannot be assessed    Data Reviewed: I have personally reviewed following labs and imaging studies  CBC: Recent Labs  Lab 04/24/19 0105 04/25/19 0526 04/26/19 0522  WBC 7.4 11.7* 8.9  NEUTROABS 6.5  --   --   HGB 15.1 16.6 14.8  HCT 44.9 49.4 46.1  MCV 92.8 93.2 98.1  PLT 105* 88* 92*   Basic Metabolic Panel: Recent Labs  Lab 04/24/19 0105 04/25/19 0526 04/26/19 0522  NA 140 141 140  K 3.8 4.2 3.7  CL 105 106 106  CO2 24 26 27   GLUCOSE 163* 141* 113*  BUN 18 19 17   CREATININE 1.03 0.70 0.72  CALCIUM 9.5 9.3 9.1  MG  --  2.1 2.1  PHOS  --  2.0*  --    GFR: Estimated Creatinine Clearance: 69.8 mL/min (by C-G formula based on SCr of 0.72 mg/dL). Liver Function Tests: Recent Labs  Lab 04/24/19 0105 04/25/19 0526  AST 22 20  ALT 14 13  ALKPHOS 72 64  BILITOT 1.1 1.2  PROT 7.2 6.9  ALBUMIN 4.4 4.0   No results for input(s): LIPASE, AMYLASE in the last 168 hours. No results for input(s): AMMONIA in the last 168 hours. Coagulation Profile: No results for input(s): INR, PROTIME in the last 168 hours. Cardiac Enzymes: No results for input(s): CKTOTAL, CKMB, CKMBINDEX, TROPONINI in the last 168 hours. BNP (last 3 results) No results for input(s): PROBNP in the last 8760 hours. HbA1C: Recent Labs    04/24/19 0113  HGBA1C 5.5   CBG: Recent Labs  Lab 04/25/19 0736 04/25/19 1121 04/25/19 1607 04/26/19 0747 04/26/19 1118  GLUCAP 130* 122* 114* 97 81   Lipid Profile: No results for input(s): CHOL, HDL, LDLCALC, TRIG, CHOLHDL, LDLDIRECT in the last 72 hours. Thyroid Function Tests: Recent Labs     04/24/19 0935  TSH 0.599   Anemia Panel: No results for input(s): VITAMINB12, FOLATE, FERRITIN, TIBC, IRON, RETICCTPCT in the last 72 hours. Sepsis Labs: No results for input(s): PROCALCITON, LATICACIDVEN in the last 168 hours.  Recent Results (from the past 240 hour(s))  SARS CORONAVIRUS 2 (TAT 6-24 HRS) Nasopharyngeal Nasopharyngeal Swab     Status: None   Collection Time: 04/24/19  5:09 AM   Specimen: Nasopharyngeal Swab  Result Value Ref Range Status   SARS Coronavirus 2 NEGATIVE NEGATIVE Final    Comment: (NOTE) SARS-CoV-2 target nucleic acids are NOT DETECTED. The SARS-CoV-2 RNA is generally detectable in upper and lower respiratory specimens during the acute phase of infection. Negative results do not preclude SARS-CoV-2 infection, do not rule out co-infections with other pathogens, and should not be used as the sole basis for treatment or other patient management decisions. Negative results must be combined with clinical observations, patient history, and epidemiological information. The expected result is Negative. Fact  Sheet for Patients: HairSlick.no Fact Sheet for Healthcare Providers: quierodirigir.com This test is not yet approved or cleared by the [place] FDA and  has been authorized for detection and/or diagnosis of SARS-CoV-2 by FDA under an Emergency Use Authorization (EUA). This EUA will remain  in effect (meaning this test can be used) for the duration of the COVID-19 declaration under Section 56 4(b)(1) of the Act, 21 U.S.C. section 360bbb-3(b)(1), unless the authorization is terminated or revoked sooner. Performed at [facility], [address] [place], [place] [address]          Radiology Studies: Portable chest 1 View  Result Date: 04/25/2019 CLINICAL DATA:  Hyperactive alcohol withdrawal delirium EXAM: PORTABLE CHEST 1 VIEW COMPARISON:  Radiograph 06/01/2012 FINDINGS: Streaky  opacities with low volumes favoring atelectasis. No consolidative opacity. No pneumothorax or visible effusion. The cardiomediastinal contours are unremarkable. Surgical clips present along the left hilum are similar to prior. No acute osseous or soft tissue abnormality. IMPRESSION: Streaky opacities with low volumes favoring atelectasis. No other acute cardiopulmonary abnormality. Electronically Signed   By: Kreg Shropshire M.D.   On: 04/25/2019 04:54        Scheduled Meds: . busPIRone  10 mg Oral BID  . clopidogrel  75 mg Oral Daily  . folic acid  1 mg Oral Daily  . insulin aspart  0-6 Units Subcutaneous TID WC  . LORazepam  0-4 mg Intravenous Q12H  . metoprolol tartrate  25 mg Oral BID  . multivitamin with minerals  1 tablet Oral Daily  . [START ON 04/27/2019] pantoprazole  40 mg Oral Daily  . sertraline  100 mg Oral Daily  . sodium chloride flush  3 mL Intravenous Q12H  . thiamine  100 mg Oral Daily   Or  . thiamine  100 mg Intravenous Daily   Continuous Infusions: . sodium chloride    . sodium chloride 50 mL/hr at 04/26/19 0208     LOS: 2 days    Time spent: 30 minutes    Krishauna Schatzman Hoover Brunette, DO [facility] Pager [phone]  If 7PM-7AM, please contact night-coverage [email or web address] Password [facility] 04/26/2019, 12:09 PM

## 2019-04-26 NOTE — Progress Notes (Signed)
PT Cancellation Note  Patient Details Name: Shishir Krantz MRN: [id number] DOB: [birth year]   Cancelled Treatment:    Reason Eval/Treat Not Completed: Fatigue/lethargy limiting ability to participate.  Patient sedated and unable to participate with therapy at this time.  Will check back later.   1:44 PM, 04/26/19 Ocie Bob, MPT Physical Therapist with [facility] [phone] office [phone] mobile phone

## 2019-04-27 ENCOUNTER — Inpatient Hospital Stay (HOSPITAL_COMMUNITY): Payer: Medicare Other

## 2019-04-27 LAB — GLUCOSE, CAPILLARY
Glucose-Capillary: 91 mg/dL (ref 70–99)
Glucose-Capillary: 106 mg/dL — ABNORMAL HIGH (ref 70–99)
Glucose-Capillary: 92 mg/dL (ref 70–99)

## 2019-04-27 LAB — BLOOD GAS, VENOUS
Acid-Base Excess: 1.8 mmol/L (ref 0.0–2.0)
Bicarbonate: 24.2 mmol/L (ref 20.0–28.0)
FIO2: 98
O2 Saturation: 42.9 %
Patient temperature: 37
pCO2, Ven: 43.8 mmHg — ABNORMAL LOW (ref 44.0–60.0)
pH, Ven: 7.394 (ref 7.250–7.430)
pO2, Ven: <31 mmHg — CL (ref 32.0–45.0)

## 2019-04-27 LAB — AMMONIA: Ammonia: 13 umol/L (ref 9–35)

## 2019-04-27 MED ORDER — LORAZEPAM 2 MG/ML IJ SOLN
0.0000 mg | INTRAMUSCULAR | Status: DC | PRN
  Administered 2019-04-27: 22:00:00 2 mg via INTRAVENOUS
  Administered 2019-04-27: 1 mg via INTRAVENOUS
  Filled 2019-04-27: qty 1

## 2019-04-27 MED ORDER — BUSPIRONE HCL 5 MG PO TABS
10.0000 mg | ORAL_TABLET | Freq: Two times a day (BID) | ORAL | Status: DC
  Administered 2019-04-28 – 2019-05-01 (×7): 10 mg via ORAL
  Filled 2019-04-27 (×8): qty 2

## 2019-04-27 MED ORDER — BUSPIRONE HCL 5 MG PO TABS
5.0000 mg | ORAL_TABLET | Freq: Two times a day (BID) | ORAL | Status: DC
  Administered 2019-04-27: 5 mg via ORAL
  Filled 2019-04-27: qty 1

## 2019-04-27 NOTE — Plan of Care (Signed)
  Problem: SLP Dysphagia Goals Goal: Patient will utilize recommended strategies Description: Patient will utilize recommended strategies during swallow to increase swallowing safety with Flowsheets (Taken 04/27/2019 1018) Patient will utilize recommended strategies during swallow to increase swallowing safety with: max assist

## 2019-04-27 NOTE — NC FL2 (Signed)
[place] LEVEL OF CARE SCREENING TOOL     IDENTIFICATION  Patient Name: Jon Bennett Birthdate: Feb 16, 1935 Sex: male Admission Date (Current Location): 04/24/2019  [facility] and [place] Number:  [employer] and Address:  [place] [address] [place]      Provider Number: [id number]  Attending Physician Name and Address:  Rodena Goldmann, DO  Relative Name and Phone Number:  [place], Mcgroarty [phone]    Current Level of Care: Hospital Recommended Level of Care: [place] Prior Approval Number:    Date Approved/Denied:   PASRR Number: [id number]  Discharge Plan: SNF    Current Diagnoses: Patient Active Problem List   Diagnosis Date Noted  . Acute hyperactive alcohol withdrawal delirium ([place]) 04/24/2019  . Diabetes mellitus without complication ([place])   . Hypertension   . GERD (gastroesophageal reflux disease)   . Thrombocytopenia ([place]) 08/16/2018  . Alcohol withdrawal ([place]) 08/13/2018    Orientation RESPIRATION BLADDER Height & Weight     Self  Normal   Weight: 152 lb 12.5 oz (69.3 kg) Height:  5\' 9"  (175.3 cm)  BEHAVIORAL SYMPTOMS/MOOD NEUROLOGICAL BOWEL NUTRITION STATUS        Diet(DYS 1)  AMBULATORY STATUS COMMUNICATION OF NEEDS Skin       Normal                       Personal Care Assistance Level of Assistance  Feeding, Dressing, Bathing Bathing Assistance: Limited assistance Feeding assistance: Independent Dressing Assistance: Limited assistance     Functional Limitations Info  Sight, Hearing, Speech Sight Info: Adequate Hearing Info: Adequate Speech Info: Adequate    SPECIAL CARE FACTORS FREQUENCY  PT (By licensed PT), Speech therapy     PT Frequency: 5x/week       Speech Therapy Frequency: 3x/week      Contractures Contractures Info: Not present    Additional Factors Info  Code Status, Allergies, Psychotropic Code Status Info: Full Code Allergies Info: Asa  Aspirin Psychotropic Info: Zoloft         Current Medications (04/27/2019):  This is the current hospital active medication list Current Facility-Administered Medications  Medication Dose Route Frequency Provider Last Rate Last Admin  . 0.9 %  sodium chloride infusion  250 mL Intravenous PRN Johnson, Clanford L, MD      . 0.9 %  sodium chloride infusion   Intravenous Continuous Johnson, Clanford L, MD 50 mL/hr at 04/26/19 2049 50 mL/hr at 04/26/19 2049  . bisacodyl (DULCOLAX) EC tablet 5 mg  5 mg Oral Daily PRN Johnson, Clanford L, MD      . busPIRone (BUSPAR) tablet 5 mg  5 mg Oral BID Manuella Ghazi, Pratik D, DO      . clopidogrel (PLAVIX) tablet 75 mg  75 mg Oral Daily Manuella Ghazi, Pratik D, DO   75 mg at 04/27/19 1157  . folic acid (FOLVITE) tablet 1 mg  1 mg Oral Daily Johnson, Clanford L, MD   1 mg at 04/27/19 1156  . haloperidol lactate (HALDOL) injection 2 mg  2 mg Intravenous Q6H PRN Johnson, Clanford L, MD      . insulin aspart (novoLOG) injection 0-6 Units  0-6 Units Subcutaneous TID WC Johnson, Clanford L, MD      . LORazepam (ATIVAN) injection 0-4 mg  0-4 mg Intravenous Q12H Johnson, Clanford L, MD   1 mg at 04/27/19 1133  . metoprolol tartrate (LOPRESSOR) tablet 25 mg  25  mg Oral BID Maurilio Lovely D, DO   25 mg at 04/27/19 1157  . multivitamin with minerals tablet 1 tablet  1 tablet Oral Daily Laural Benes, Clanford L, MD   1 tablet at 04/27/19 1157  . ondansetron (ZOFRAN) tablet 4 mg  4 mg Oral Q6H PRN Johnson, Clanford L, MD       Or  . ondansetron (ZOFRAN) injection 4 mg  4 mg Intravenous Q6H PRN Johnson, Clanford L, MD      . pantoprazole (PROTONIX) EC tablet 40 mg  40 mg Oral Daily Sherryll Burger, Pratik D, DO   40 mg at 04/27/19 1159  . sertraline (ZOLOFT) tablet 100 mg  100 mg Oral Daily Sherryll Burger, Pratik D, DO   100 mg at 04/27/19 1136  . sodium chloride flush (NS) 0.9 % injection 3 mL  3 mL Intravenous Q12H Johnson, Clanford L, MD   3 mL at 04/26/19 2043  . sodium chloride flush (NS) 0.9 % injection 3 mL   3 mL Intravenous PRN Johnson, Clanford L, MD      . thiamine tablet 100 mg  100 mg Oral Daily Johnson, Clanford L, MD   100 mg at 04/27/19 1159   Or  . thiamine (B-1) injection 100 mg  100 mg Intravenous Daily Johnson, Clanford L, MD   100 mg at 04/25/19 1100     Discharge Medications: Please see discharge summary for a list of discharge medications.  Relevant Imaging Results:  Relevant Lab Results:   Additional Information SSN [id number], Juleen China, LCSW

## 2019-04-27 NOTE — Care Management Important Message (Signed)
Important Message  Patient Details  Name: Jon Bennett MRN: [id number] Date of Birth: 1935-01-30   Medicare Important Message Given:  Yes     Corey Harold 04/27/2019, 2:02 PM

## 2019-04-27 NOTE — Progress Notes (Signed)
PROGRESS NOTE    Jon Bennett  VHQ:[id number] DOB: 01-29-1935 DOA: 04/24/2019 PCP: [name], MD   Brief Narrative:  Per HPI: Jon Bennett a 85 y.o.malewith medical history significantfor long history of heavy alcohol consumption, chronic alcoholic pancreatitis, diabetes mellitus, GERD, hypertension and peripheral vascular disease reportedly consumes half a pint of whiskey daily. He has not had alcohol in the last 2 days. He told his family members that he felt like he was going to die. He started having tremors. They brought him to the emergency department by EMS for evaluation. He has been complaining of headaches. He is also having nausea and vomiting that started about 2 hours prior to arrival. He has been agitated and fidgeting. The patient's family says that he is in denial about his alcohol problem.  2/10:Patient was admitted with acute alcohol withdrawal symptoms with elevated CIWA protocol. He was given some Haldol and IV hydration as needed. He appears somnolent this morning and otherwise nonconversational. Son is at bedside and concerned about his chronic drinking issue and feels he may need some rehabilitation. Will have PT evaluate him in the morning once more alert.  2/11: Patient continues to remain somewhat confused and somnolent this morning.  PT evaluation currently pending.  He has been started on dysphagia 1 diet.  Will transition IV metoprolol to oral and resume his home BuSpar and switch to oral Protonix today as well.  Anticipate discharge once mentation has further improved and patient has been evaluated by PT.  2/12: Patient has periods of somnolence as well as some agitation.  Will resume home BuSpar at half his usual dose and give Ativan as needed.  PT evaluation with recommendations for SNF.  SLP recommending dysphagia 1 diet.   Assessment & Plan:   Principal Problem:   Acute hyperactive alcohol withdrawal delirium (HCC) Active Problems:  Alcohol withdrawal (HCC)   Thrombocytopenia (HCC)   Diabetes mellitus without complication (HCC)   Hypertension   GERD (gastroesophageal reflux disease)   1. Acute alcohol withdrawal-the patient presented with a very high CIWA score greater than 20.He appears to not be having any further withdrawal symptoms and his vital signs are stable. He appears to be more somnolent from Ativan that was given at 2100 on 2/9. Will avoid further doses and monitor for increased alertness. PT evaluation  with recommendation for SNF Will resume home BuSpar for now and half of his usual dose.  Started on dysphagia 1 diet per SLP.  Repeat brain MRI with no acute findings and no significant abnormalities on venous blood gas.  Ammonia levels pending. 2. Chronic alcoholism-when patient medically stabilized which has Child psychotherapist to see patient for consideration of alcohol treatment program. 3. Thrombocytopenia-stable.  Likely secondary to liver damage from chronic alcoholism. Holding heparin products. Follow.Repeat CBC in a.m. 4. GERD-Protonix ordered for GI protection. 5. Diabetes mellitustype 2 - mostly diet controlled, will follow CBG and sensitive SSI only if needed.   DVT prophylaxis:SCDs Code Status:Full Family Communication:Son, Mark at bedside, discussed on phone 2/12 Disposition Plan:Continue current management  and monitor carefully.  Plan for discharge to rehabilitation facility once improved.   Consultants:  None  Procedures:  See imaging below  Antimicrobials:   None   Subjective: Patient seen and evaluated today with periods of somnolence and agitation that are ongoing.  No acute overnight events noted.  Objective: Vitals:   04/26/19 1800 04/26/19 2052 04/26/19 2356 04/27/19 0639  BP: (!) 141/87  124/74 136/70  Pulse: 84  77 66  Resp: 19  18   Temp: 98.4 F (36.9 C)  98.5 F (36.9 C) 98.4 F (36.9 C)  TempSrc: Oral  Oral Oral  SpO2: 99% 96% 92% 100%   Weight:      Height:        Intake/Output Summary (Last 24 hours) at 04/27/2019 1255 Last data filed at 04/27/2019 0600 Gross per 24 hour  Intake 1204.81 ml  Output 800 ml  Net 404.81 ml   Filed Weights   04/24/19 0054 04/24/19 1100  Weight: 70.3 kg 69.3 kg    Examination:  General exam: Appears calm and somewhat somnolent Respiratory system: Clear to auscultation. Respiratory effort normal. Cardiovascular system: S1 & S2 heard, RRR. No JVD, murmurs, rubs, gallops or clicks. No pedal edema. Gastrointestinal system: Abdomen is nondistended, soft and nontender. No organomegaly or masses felt. Normal bowel sounds heard. Central nervous system: Somnolent with periods of some agitation Extremities: No significant edema Skin: No rashes, lesions or ulcers Psychiatry: Difficult to assess    Data Reviewed: I have personally reviewed following labs and imaging studies  CBC: Recent Labs  Lab 04/24/19 0105 04/25/19 0526 04/26/19 0522  WBC 7.4 11.7* 8.9  NEUTROABS 6.5  --   --   HGB 15.1 16.6 14.8  HCT 44.9 49.4 46.1  MCV 92.8 93.2 98.1  PLT 105* 88* 92*   Basic Metabolic Panel: Recent Labs  Lab 04/24/19 0105 04/25/19 0526 04/26/19 0522  NA 140 141 140  K 3.8 4.2 3.7  CL 105 106 106  CO2 24 26 27   GLUCOSE 163* 141* 113*  BUN 18 19 17   CREATININE 1.03 0.70 0.72  CALCIUM 9.5 9.3 9.1  MG  --  2.1 2.1  PHOS  --  2.0*  --    GFR: Estimated Creatinine Clearance: 69.8 mL/min (by C-G formula based on SCr of 0.72 mg/dL). Liver Function Tests: Recent Labs  Lab 04/24/19 0105 04/25/19 0526  AST 22 20  ALT 14 13  ALKPHOS 72 64  BILITOT 1.1 1.2  PROT 7.2 6.9  ALBUMIN 4.4 4.0   No results for input(s): LIPASE, AMYLASE in the last 168 hours. Recent Labs  Lab 04/27/19 1118  AMMONIA 13   Coagulation Profile: No results for input(s): INR, PROTIME in the last 168 hours. Cardiac Enzymes: No results for input(s): CKTOTAL, CKMB, CKMBINDEX, TROPONINI in the last 168  hours. BNP (last 3 results) No results for input(s): PROBNP in the last 8760 hours. HbA1C: No results for input(s): HGBA1C in the last 72 hours. CBG: Recent Labs  Lab 04/26/19 0747 04/26/19 1118 04/26/19 1628 04/27/19 0805 04/27/19 1137  GLUCAP 97 81 96 91 106*   Lipid Profile: No results for input(s): CHOL, HDL, LDLCALC, TRIG, CHOLHDL, LDLDIRECT in the last 72 hours. Thyroid Function Tests: No results for input(s): TSH, T4TOTAL, FREET4, T3FREE, THYROIDAB in the last 72 hours. Anemia Panel: No results for input(s): VITAMINB12, FOLATE, FERRITIN, TIBC, IRON, RETICCTPCT in the last 72 hours. Sepsis Labs: No results for input(s): PROCALCITON, LATICACIDVEN in the last 168 hours.  Recent Results (from the past 240 hour(s))  SARS CORONAVIRUS 2 (TAT 6-24 HRS) Nasopharyngeal Nasopharyngeal Swab     Status: None   Collection Time: 04/24/19  5:09 AM   Specimen: Nasopharyngeal Swab  Result Value Ref Range Status   SARS Coronavirus 2 NEGATIVE NEGATIVE Final    Comment: (NOTE) SARS-CoV-2 target nucleic acids are NOT DETECTED. The SARS-CoV-2 RNA is generally detectable in upper and lower respiratory specimens during the acute phase of  infection. Negative results do not preclude SARS-CoV-2 infection, do not rule out co-infections with other pathogens, and should not be used as the sole basis for treatment or other patient management decisions. Negative results must be combined with clinical observations, patient history, and epidemiological information. The expected result is Negative. Fact Sheet for Patients: HairSlick.no Fact Sheet for Healthcare Providers: quierodirigir.com This test is not yet approved or cleared by the [place] FDA and  has been authorized for detection and/or diagnosis of SARS-CoV-2 by FDA under an Emergency Use Authorization (EUA). This EUA will remain  in effect (meaning this test can be used) for the  duration of the COVID-19 declaration under Section 56 4(b)(1) of the Act, 21 U.S.C. section 360bbb-3(b)(1), unless the authorization is terminated or revoked sooner. Performed at [facility], [address] [place], [place] [address]          Radiology Studies: MR BRAIN WO CONTRAST  Result Date: 04/27/2019 CLINICAL DATA:  Encephalopathy EXAM: MRI HEAD WITHOUT CONTRAST TECHNIQUE: Multiplanar, multiecho pulse sequences of the brain and surrounding structures were obtained without intravenous contrast. COMPARISON:  Head CT April 24, 2019 FINDINGS: The study is degraded by motion. Brain: No acute infarction, hemorrhage, hydrocephalus, extra-axial collection or mass lesion. Moderate amount of scattered and confluent T2 hyperintensity are seen within the white matter of the cerebral hemispheres, nonspecific, most likely related to chronic small vessel ischemia. 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 The significant prominence of the ventricular system and cerebral sulci reflecting parenchymal volume loss. Vascular: Normal flow voids. Skull and upper cervical spine: Normal marrow signal. Sinuses/Orbits: Negative. Other: None. IMPRESSION: 1. No acute intracranial abnormality. 2. Moderate parenchymal volume loss and probable sequela of chronic small vessel ischemia. 3. 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 Electronically Signed   By: Baldemar Lenis M.D.   On: 04/27/2019 11:04        Scheduled Meds: . busPIRone  5 mg Oral BID  . clopidogrel  75 mg Oral Daily  . folic acid  1 mg Oral Daily  . insulin aspart  0-6 Units Subcutaneous TID WC  . LORazepam  0-4 mg Intravenous Q12H  . metoprolol tartrate  25 mg Oral BID  . multivitamin with minerals  1 tablet Oral Daily  . pantoprazole  40 mg Oral Daily  . sertraline  100 mg Oral Daily  . sodium chloride flush  3 mL Intravenous  Q12H  . thiamine  100 mg Oral Daily   Or  . thiamine  100 mg Intravenous Daily   Continuous Infusions: . sodium chloride    . sodium chloride 50 mL/hr (04/26/19 2049)     LOS: 3 days    Time spent: 30 minutes    Mikena Masoner Hoover Brunette, DO [facility] Pager [phone]  If 7PM-7AM, please contact night-coverage [email or web address] Password [facility] 04/27/2019, 12:55 PM

## 2019-04-27 NOTE — TOC Progression Note (Signed)
Transition of Care [facility]) - Progression Note    Patient Details  Name: Jon Bennett MRN: [id number] Date of Birth: 1934-07-11  Transition of Care [facility]) CM/SW Contact  Annice Needy, LCSW Phone Number: 04/27/2019, 2:16 PM  Clinical Narrative:    PT evaluation recommended SNF for rehab. Message left for son [name] requesting return contact.    Expected Discharge Plan: Home w Home Health Services Barriers to Discharge: Continued Medical Work up  Expected Discharge Plan and Services Expected Discharge Plan: Home w Home Health Services       Living arrangements for the past 2 months: Single Family Home                                       Social Determinants of Health (SDOH) Interventions    Readmission Risk Interventions No flowsheet data found.

## 2019-04-27 NOTE — Progress Notes (Signed)
CRITICAL VALUE ALERT  Critical Value:  Venous blood gas, PO2 <31  Date & Time Notied:  04/27/2019 1200 pm  Provider Notified: Dr. Sherryll Burger  Orders Received/Actions taken: No new orders at this time.

## 2019-04-27 NOTE — Evaluation (Signed)
Physical Therapy Evaluation Patient Details Name: Marquinn Meschke MRN: [id number] DOB: [birth year] Today's Date: 04/27/2019   History of Present Illness  Jakeim Sedore is a 85 y.o. male with medical history significant for long history of heavy alcohol consumption, chronic alcoholic pancreatitis, diabetes mellitus, GERD, hypertension and peripheral vascular disease reportedly consumes half a pint of whiskey daily.  He has not had alcohol in the last 2 days.  He told his family members that he felt like he was going to die.  He started having tremors.  They brought him to the emergency department by EMS for evaluation.  He has been complaining of headaches.  He is also having nausea and vomiting that started about 2 hours prior to arrival.  He has been agitated and fidgeting.  The patient's family says that he is in denial about his alcohol problem    Clinical Impression  Patient limited for functional mobility as stated below secondary to BLE weakness, fatigue and poor standing balance.  Patient at high risk for falls and bumps into nearby objects during ambulation, also limited due to c/o left knee pain.  Patient will benefit from continued physical therapy in hospital and recommended venue below to increase strength, balance, endurance for safe ADLs and gait.     Follow Up Recommendations SNF    Equipment Recommendations  None recommended by PT    Recommendations for Other Services       Precautions / Restrictions Precautions Precautions: Fall Restrictions Weight Bearing Restrictions: No      Mobility  Bed Mobility Overal bed mobility: Needs Assistance Bed Mobility: Supine to Sit     Supine to sit: Mod assist     General bed mobility comments: slow labored movement  Transfers Overall transfer level: Needs assistance Equipment used: Rolling walker (2 wheeled) Transfers: Stand Pivot Transfers;Sit to/from Stand Sit to Stand: Mod assist Stand pivot transfers: Mod assist        General transfer comment: slow labored movement, requires repeated verbal/tactile cueing for safety  Ambulation/Gait Ambulation/Gait assistance: Mod assist Gait Distance (Feet): 25 Feet Assistive device: Rolling walker (2 wheeled) Gait Pattern/deviations: Decreased step length - right;Decreased step length - left;Decreased stride length Gait velocity: decreased   General Gait Details: slow unsteady labored cadence with frequent bumping into nearby objects, limited secondary to c/o left knee pain and fatigue  Stairs            Wheelchair Mobility    Modified Rankin (Stroke Patients Only)       Balance Overall balance assessment: Needs assistance Sitting-balance support: Feet supported;No upper extremity supported Sitting balance-Leahy Scale: Fair Sitting balance - Comments: seated at bedside   Standing balance support: Bilateral upper extremity supported;During functional activity Standing balance-Leahy Scale: Poor Standing balance comment: fair/poor using RW                             Pertinent Vitals/Pain Pain Assessment: No/denies pain    Home Living Family/patient expects to be discharged to:: Private residence Living Arrangements: Children Available Help at Discharge: Family;Available 24 hours/day Type of Home: House Home Access: Stairs to enter Entrance Stairs-Rails: Right;Left;Can reach both Entrance Stairs-Number of Steps: 2 Home Layout: One level Home Equipment: Walker - 2 wheels;Cane - single point;Wheelchair - manual      Prior Function Level of Independence: Independent         Comments: community ambulator, drives     Higher education careers adviser  Extremity/Trunk Assessment   Upper Extremity Assessment Upper Extremity Assessment: Generalized weakness    Lower Extremity Assessment Lower Extremity Assessment: Generalized weakness    Cervical / Trunk Assessment Cervical / Trunk Assessment: Kyphotic  Communication    Communication: No difficulties  Cognition Arousal/Alertness: Awake/alert Behavior During Therapy: WFL for tasks assessed/performed Overall Cognitive Status: Impaired/Different from baseline Area of Impairment: Safety/judgement;Awareness                         Safety/Judgement: Decreased awareness of safety;Decreased awareness of deficits Awareness: Emergent   General Comments: able to follow directions with repeated verbal/tactile cueing      General Comments      Exercises     Assessment/Plan    PT Assessment Patient needs continued PT services  PT Problem List Decreased strength;Decreased activity tolerance;Decreased balance;Decreased mobility       PT Treatment Interventions Gait training;Stair training;Functional mobility training;Therapeutic activities;Therapeutic exercise;Patient/family education    PT Goals (Current goals can be found in the Care Plan section)  Acute Rehab PT Goals Patient Stated Goal: return home with family to assist PT Goal Formulation: With patient Time For Goal Achievement: 05/11/19 Potential to Achieve Goals: Good    Frequency Min 3X/week   Barriers to discharge        Co-evaluation               AM-PAC PT "6 Clicks" Mobility  Outcome Measure Help needed turning from your back to your side while in a flat bed without using bedrails?: A Little Help needed moving from lying on your back to sitting on the side of a flat bed without using bedrails?: A Lot Help needed moving to and from a bed to a chair (including a wheelchair)?: A Lot Help needed standing up from a chair using your arms (e.g., wheelchair or bedside chair)?: A Lot Help needed to walk in hospital room?: A Lot Help needed climbing 3-5 steps with a railing? : A Lot 6 Click Score: 13    End of Session   Activity Tolerance: Patient tolerated treatment well;Patient limited by fatigue Patient left: in chair;with call bell/phone within reach;with chair alarm  set;with nursing/sitter in room Nurse Communication: Mobility status PT Visit Diagnosis: Other abnormalities of gait and mobility (R26.89);Unsteadiness on feet (R26.81);Muscle weakness (generalized) (M62.81)    Time: 7026-3785 PT Time Calculation (min) (ACUTE ONLY): 30 min   Charges:   PT Evaluation $PT Eval Moderate Complexity: 1 Mod PT Treatments $Therapeutic Activity: 23-37 mins        10:11 AM, 04/27/19 Lonell Grandchild, MPT Physical Therapist with [facility] [phone] office [phone] mobile phone

## 2019-04-27 NOTE — Evaluation (Signed)
Clinical/Bedside Swallow Evaluation Patient Details  Name: Dashawn Bartnick MRN: [id number] Date of Birth: [birth year]  Today's Date: 04/27/2019 Time: SLP Start Time (ACUTE ONLY): 0935 SLP Stop Time (ACUTE ONLY): 0958 SLP Time Calculation (min) (ACUTE ONLY): 23 min  Past Medical History:  Past Medical History:  Diagnosis Date  . Diabetes mellitus without complication (HCC)   . GERD (gastroesophageal reflux disease)   . Hypertension   . Pancreatitis, alcoholic, acute    Past Surgical History:  Past Surgical History:  Procedure Laterality Date  . VASCULAR SURGERY     HPI:  Aldridge Krzyzanowski a 85 y.o.malewith medical history significantfor long history of heavy alcohol consumption, chronic alcoholic pancreatitis, diabetes mellitus, GERD, hypertension and peripheral vascular disease reportedly consumes half a pint of whiskey daily. He has not had alcohol in the last 2 days. He told his family members that he felt like he was going to die. He started having tremors. They brought him to the emergency department by EMS for evaluation. He has been complaining of headaches. He is also having nausea and vomiting that started about 2 hours prior to arrival. He has been agitated and fidgeting. The patient's family says that he is in denial about his alcohol problem. Patient was admitted with acute alcohol withdrawal symptoms with elevated CIWA protocol.  He was given some Haldol and IV hydration as needed.  He appears somnolent this morning and otherwise nonconversational.  Son is at bedside and concerned about his chronic drinking issue and feels he may need some rehabilitation. BSE requested.   Assessment / Plan / Recommendation Clinical Impression  Clinical swallowing evaluation completed while Pt was sitting upright in chair; Pt consumed thin liquids with an initial coughing episode, however, right after Pt took a sip he reclined backwards while swallowing, this positioning likely encouraged  this coughing episode. Pt consumed thin liquids via straw and cup while sitting completely upright without overt s/sx of aspiraiton. SLP provided a small presentation of regular textures; Pt was unable to masticate and provide oral manipulation of bolus likely compounded by AMS and poor awareness of bolus despite max verbal cues. SLP manually removed regular bolus from Pt's oral cavity. Pt consumed puree textures without overt s/sx of aspiration. Recommend continue with D1/puree and thin liquids; medications to be crushed in puree and ensure Pt is seated upright for all PO. ST will continue to follow for potential diet upgrade if/as mentation continues to improve SLP Visit Diagnosis: Dysphagia, unspecified (R13.10)    Aspiration Risk  Mild aspiration risk    Diet Recommendation Dysphagia 1 (Puree);Thin liquid   Liquid Administration via: Cup;Straw Medication Administration: Crushed with puree Supervision: Full supervision/cueing for compensatory strategies;Staff to assist with self feeding Compensations: Minimize environmental distractions;Slow rate;Small sips/bites;Follow solids with liquid Postural Changes: Seated upright at 90 degrees;Remain upright for at least 30 minutes after po intake    Other  Recommendations Oral Care Recommendations: Oral care BID   Follow up Recommendations 24 hour supervision/assistance;Skilled Nursing facility      Frequency and Duration min 1 x/week  1 week       Prognosis Prognosis for Safe Diet Advancement: Guarded Barriers to Reach Goals: Cognitive deficits      Swallow Study   General Date of Onset: 04/24/19 HPI: Hazael Olveda a 85 y.o.malewith medical history significantfor long history of heavy alcohol consumption, chronic alcoholic pancreatitis, diabetes mellitus, GERD, hypertension and peripheral vascular disease reportedly consumes half a pint of whiskey daily. He has not had alcohol in the last 2  days. He told his family members that he  felt like he was going to die. He started having tremors. They brought him to the emergency department by EMS for evaluation. He has been complaining of headaches. He is also having nausea and vomiting that started about 2 hours prior to arrival. He has been agitated and fidgeting. The patient's family says that he is in denial about his alcohol problem. Patient was admitted with acute alcohol withdrawal symptoms with elevated CIWA protocol.  He was given some Haldol and IV hydration as needed.  He appears somnolent this morning and otherwise nonconversational.  Son is at bedside and concerned about his chronic drinking issue and feels he may need some rehabilitation. BSE requested. Type of Study: Bedside Swallow Evaluation Previous Swallow Assessment: none in chart Diet Prior to this Study: Dysphagia 1 (puree);Thin liquids Temperature Spikes Noted: No Respiratory Status: Room air History of Recent Intubation: No Behavior/Cognition: Alert;Confused;Impulsive;Agitated;Distractible;Requires cueing Oral Cavity Assessment: Dry;Dried secretions Oral Care Completed by SLP: Yes Oral Cavity - Dentition: Edentulous Self-Feeding Abilities: Needs assist;Total assist;Needs set up Patient Positioning: Upright in chair Baseline Vocal Quality: Normal Volitional Cough: Cognitively unable to elicit Volitional Swallow: Unable to elicit    Oral/Motor/Sensory Function Overall Oral Motor/Sensory Function: Within functional limits   Ice Chips Ice chips: Within functional limits   Thin Liquid Thin Liquid: Impaired Presentation: Cup;Straw Pharyngeal  Phase Impairments: Cough - Immediate    Nectar Thick Nectar Thick Liquid: Not tested   Honey Thick Honey Thick Liquid: Not tested   Puree Puree: Within functional limits   Solid          Solid: Impaired Presentation: Spoon Oral Phase Impairments: Poor awareness of bolus;Impaired mastication;Reduced lingual movement/coordination Oral Phase Functional  Implications: Oral residue;Oral holding;Prolonged oral transit     Lealon Vanputten H. Roddie Mc, [place] Speech Language Pathologist  Wende Bushy 04/27/2019,10:16 AM

## 2019-04-27 NOTE — Plan of Care (Signed)
  Problem: Acute Rehab PT Goals(only PT should resolve) Goal: Pt Will Go Supine/Side To Sit Outcome: Progressing Flowsheets (Taken 04/27/2019 1013) Pt will go Supine/Side to Sit: with minimal assist Goal: Patient Will Transfer Sit To/From Stand Outcome: Progressing Flowsheets (Taken 04/27/2019 1013) Patient will transfer sit to/from stand: with minimal assist Goal: Pt Will Transfer Bed To Chair/Chair To Bed Outcome: Progressing Flowsheets (Taken 04/27/2019 1013) Pt will Transfer Bed to Chair/Chair to Bed: with min assist Goal: Pt Will Ambulate Outcome: Progressing Flowsheets (Taken 04/27/2019 1013) Pt will Ambulate:  50 feet  with minimal assist  with rolling walker   10:13 AM, 04/27/19 Ocie Bob, MPT Physical Therapist with [facility] [phone] office [phone] mobile phone

## 2019-04-28 LAB — CBC
HCT: 48.4 % (ref 39.0–52.0)
Hemoglobin: 16.4 g/dL (ref 13.0–17.0)
MCH: 31.1 pg (ref 26.0–34.0)
MCHC: 33.9 g/dL (ref 30.0–36.0)
MCV: 91.8 fL (ref 80.0–100.0)
Platelets: 90 10*3/uL — ABNORMAL LOW (ref 150–400)
RBC: 5.27 MIL/uL (ref 4.22–5.81)
RDW: 13.3 % (ref 11.5–15.5)
WBC: 8.8 10*3/uL (ref 4.0–10.5)
nRBC: 0 % (ref 0.0–0.2)

## 2019-04-28 LAB — BASIC METABOLIC PANEL
Anion gap: 13 (ref 5–15)
BUN: 17 mg/dL (ref 8–23)
CO2: 24 mmol/L (ref 22–32)
Calcium: 9.4 mg/dL (ref 8.9–10.3)
Chloride: 100 mmol/L (ref 98–111)
Creatinine, Ser: 0.68 mg/dL (ref 0.61–1.24)
GFR calc Af Amer: >60 mL/min (ref >60)
GFR calc non Af Amer: >60 mL/min (ref >60)
Glucose, Bld: 94 mg/dL (ref 70–99)
Potassium: 3.5 mmol/L (ref 3.5–5.1)
Sodium: 137 mmol/L (ref 135–145)

## 2019-04-28 LAB — GLUCOSE, CAPILLARY
Glucose-Capillary: 95 mg/dL (ref 70–99)
Glucose-Capillary: 93 mg/dL (ref 70–99)
Glucose-Capillary: 88 mg/dL (ref 70–99)

## 2019-04-28 MED ORDER — LORAZEPAM 2 MG/ML IJ SOLN
2.0000 mg | INTRAMUSCULAR | Status: DC | PRN
  Administered 2019-04-30 (×2): 2 mg via INTRAVENOUS
  Filled 2019-04-28: qty 1

## 2019-04-28 MED ORDER — ACETAMINOPHEN 325 MG PO TABS
650.0000 mg | ORAL_TABLET | Freq: Four times a day (QID) | ORAL | Status: DC | PRN
  Administered 2019-04-28 – 2019-04-30 (×3): 650 mg via ORAL
  Filled 2019-04-28 (×3): qty 2

## 2019-04-28 MED ORDER — HYDROXYZINE HCL 25 MG PO TABS: 25.0000 mg | ORAL_TABLET | Freq: Four times a day (QID) | ORAL | Status: AC | PRN

## 2019-04-28 MED ORDER — ONDANSETRON 4 MG PO TBDP: 4.0000 mg | ORAL_TABLET | Freq: Four times a day (QID) | ORAL | Status: AC | PRN

## 2019-04-28 MED ORDER — THIAMINE HCL 100 MG/ML IJ SOLN
100.0000 mg | Freq: Once | INTRAMUSCULAR | Status: DC
  Filled 2019-04-28: qty 2

## 2019-04-28 MED ORDER — THIAMINE HCL 100 MG PO TABS
100.0000 mg | ORAL_TABLET | Freq: Every day | ORAL | Status: DC
  Administered 2019-04-30 – 2019-05-01 (×2): 100 mg via ORAL
  Filled 2019-04-28 (×3): qty 1

## 2019-04-28 MED ORDER — ADULT MULTIVITAMIN W/MINERALS CH: 1.0000 | ORAL_TABLET | Freq: Every day | ORAL | Status: DC

## 2019-04-28 MED ORDER — CHLORDIAZEPOXIDE HCL 25 MG PO CAPS: 25.0000 mg | ORAL_CAPSULE | Freq: Four times a day (QID) | ORAL | Status: AC | PRN

## 2019-04-28 MED ORDER — LOPERAMIDE HCL 2 MG PO CAPS: 2.0000 mg | ORAL_CAPSULE | ORAL | Status: AC | PRN

## 2019-04-28 NOTE — Progress Notes (Signed)
Patient has been more alert this afternoon. Able to ask questions appropriately and much less agitated. No ativan administered to patient today. Will continue to monitor.

## 2019-04-28 NOTE — Progress Notes (Signed)
PROGRESS NOTE    Jon Bennett  QMG:[id number] DOB: [birth year] DOA: 04/24/2019 PCP: Mertha Baars, MD   Brief Narrative:  Per HPI: Jon Bennett a 85 y.o.malewith medical history significantfor long history of heavy alcohol consumption, chronic alcoholic pancreatitis, diabetes mellitus, GERD, hypertension and peripheral vascular disease reportedly consumes half a pint of whiskey daily. He has not had alcohol in the last 2 days. He told his family members that he felt like he was going to die. He started having tremors. They brought him to the emergency department by EMS for evaluation. He has been complaining of headaches. He is also having nausea and vomiting that started about 2 hours prior to arrival. He has been agitated and fidgeting. The patient's family says that he is in denial about his alcohol problem.  2/10:Patient was admitted with acute alcohol withdrawal symptoms with elevated CIWA protocol. He was given some Haldol and IV hydration as needed. He appears somnolent this morning and otherwise nonconversational. Son is at bedside and concerned about his chronic drinking issue and feels he may need some rehabilitation. Will have PT evaluate him in the morning once more alert.  2/11:Patient continues to remain somewhat confused and somnolent this morning. PT evaluation currently pending. He has been started on dysphagia 1 diet. Will transition IV metoprolol to oral and resume his home BuSpar and switch to oral Protonix today as well. Anticipate discharge once mentation has further improved and patient has been evaluated by PT.  2/12: Patient has periods of somnolence as well as some agitation.  Will resume home BuSpar at half his usual dose and give Ativan as needed.  PT evaluation with recommendations for SNF.  SLP recommending dysphagia 1 diet.  2/13: Patient continues to have periods of agitation and somnolence.  We will try Librium protocol today and have Ativan  only as needed and see if this seems to help a little bit better with control of his symptoms.  BuSpar increased to regular home dose at this point.  Assessment & Plan:   Principal Problem:   Acute hyperactive alcohol withdrawal delirium (HCC) Active Problems:   Alcohol withdrawal (HCC)   Thrombocytopenia (HCC)   Diabetes mellitus without complication (HCC)   Hypertension   GERD (gastroesophageal reflux disease)   1. Acute alcohol withdrawal-the patient presented with a very high CIWA score greater than 20.He appears to not be having any further withdrawal symptoms and his vital signs are stable. He appears to be more somnolent from Ativan that was given at 2100 on 2/9. Will avoid further doses and monitor for increased alertness. PT evaluation with recommendation for SNFWill resume home BuSpar at usual dose.  Started on dysphagia 1 diet per SLP.  Repeat brain MRI with no acute findings and no significant abnormalities on venous blood gas.  Ammonia levels are unremarkable.  We will try Librium protocol on CIWA. 2. Chronic alcoholism-when patient medically stabilized which has Education officer, museum to see patient for consideration of alcohol treatment program. 3. Thrombocytopenia-stable. Likely secondary to liver damage from chronic alcoholism. Holding heparin products. Follow.Repeat CBC in a.m. 4. GERD-Protonix ordered for GI protection. 5. Diabetes mellitustype 2 - mostly diet controlled, will follow CBG and sensitive SSI only if needed.   DVT prophylaxis:SCDs Code Status:Full Family Communication:Son, Jon Bennett at bedside, discussed on phone 2/12 Disposition Plan:Continue current management and monitor carefully.  Plan for discharge to rehabilitation facility once improved.  Try Librium protocol today.   Consultants:  None  Procedures:  See imaging below  Antimicrobials:  None   Subjective: Patient seen and evaluated today with ongoing episodes of delirium  and agitation.  He appears somewhat somnolent this morning.  Objective: Vitals:   04/27/19 1602 04/27/19 1806 04/28/19 0005 04/28/19 0614  BP: 133/82 (!) 142/74 (!) 146/73 (!) 162/88  Pulse: 93 76 85 82  Resp: 17 19  19   Temp: (!) 97.3 F (36.3 C) 98.9 F (37.2 C) 99.1 F (37.3 C) 98.4 F (36.9 C)  TempSrc: Axillary  Oral Oral  SpO2: 98% 98%  100%  Weight:      Height:        Intake/Output Summary (Last 24 hours) at 04/28/2019 1055 Last data filed at 04/28/2019 0900 Gross per 24 hour  Intake 1012.63 ml  Output 800 ml  Net 212.63 ml   Filed Weights   04/24/19 0054 04/24/19 1100  Weight: 70.3 kg 69.3 kg    Examination:  General exam: Appears somnolent Respiratory system: Clear to auscultation. Respiratory effort normal.  Currently on nasal cannula. Cardiovascular system: S1 & S2 heard, RRR. No JVD, murmurs, rubs, gallops or clicks. No pedal edema. Gastrointestinal system: Abdomen is nondistended, soft and nontender. No organomegaly or masses felt. Normal bowel sounds heard. Central nervous system: Somnolent, mitts are present Extremities: No edema Skin: No rashes, lesions or ulcers Psychiatry: Cannot be evaluated.    Data Reviewed: I have personally reviewed following labs and imaging studies  CBC: Recent Labs  Lab 04/24/19 0105 04/25/19 0526 04/26/19 0522 04/28/19 0639  WBC 7.4 11.7* 8.9 8.8  NEUTROABS 6.5  --   --   --   HGB 15.1 16.6 14.8 16.4  HCT 44.9 49.4 46.1 48.4  MCV 92.8 93.2 98.1 91.8  PLT 105* 88* 92* 90*   Basic Metabolic Panel: Recent Labs  Lab 04/24/19 0105 04/25/19 0526 04/26/19 0522 04/28/19 0639  NA 140 141 140 137  K 3.8 4.2 3.7 3.5  CL 105 106 106 100  CO2 24 26 27 24   GLUCOSE 163* 141* 113* 94  BUN 18 19 17 17   CREATININE 1.03 0.70 0.72 0.68  CALCIUM 9.5 9.3 9.1 9.4  MG  --  2.1 2.1  --   PHOS  --  2.0*  --   --    GFR: Estimated Creatinine Clearance: 69.8 mL/min (by C-G formula based on SCr of 0.68 mg/dL). Liver  Function Tests: Recent Labs  Lab 04/24/19 0105 04/25/19 0526  AST 22 20  ALT 14 13  ALKPHOS 72 64  BILITOT 1.1 1.2  PROT 7.2 6.9  ALBUMIN 4.4 4.0   No results for input(s): LIPASE, AMYLASE in the last 168 hours. Recent Labs  Lab 04/27/19 1118  AMMONIA 13   Coagulation Profile: No results for input(s): INR, PROTIME in the last 168 hours. Cardiac Enzymes: No results for input(s): CKTOTAL, CKMB, CKMBINDEX, TROPONINI in the last 168 hours. BNP (last 3 results) No results for input(s): PROBNP in the last 8760 hours. HbA1C: No results for input(s): HGBA1C in the last 72 hours. CBG: Recent Labs  Lab 04/26/19 1628 04/27/19 0805 04/27/19 1137 04/27/19 1638 04/28/19 0720  GLUCAP 96 91 106* 92 95   Lipid Profile: No results for input(s): CHOL, HDL, LDLCALC, TRIG, CHOLHDL, LDLDIRECT in the last 72 hours. Thyroid Function Tests: No results for input(s): TSH, T4TOTAL, FREET4, T3FREE, THYROIDAB in the last 72 hours. Anemia Panel: No results for input(s): VITAMINB12, FOLATE, FERRITIN, TIBC, IRON, RETICCTPCT in the last 72 hours. Sepsis Labs: No results for input(s): PROCALCITON, LATICACIDVEN in the last 168  hours.  Recent Results (from the past 240 hour(s))  SARS CORONAVIRUS 2 (TAT 6-24 HRS) Nasopharyngeal Nasopharyngeal Swab     Status: None   Collection Time: 04/24/19  5:09 AM   Specimen: Nasopharyngeal Swab  Result Value Ref Range Status   SARS Coronavirus 2 NEGATIVE NEGATIVE Final    Comment: (NOTE) SARS-CoV-2 target nucleic acids are NOT DETECTED. The SARS-CoV-2 RNA is generally detectable in upper and lower respiratory specimens during the acute phase of infection. Negative results do not preclude SARS-CoV-2 infection, do not rule out co-infections with other pathogens, and should not be used as the sole basis for treatment or other patient management decisions. Negative results must be combined with clinical observations, patient history, and epidemiological  information. The expected result is Negative. Fact Sheet for Patients: HairSlick.no Fact Sheet for Healthcare Providers: quierodirigir.com This test is not yet approved or cleared by the [place] FDA and  has been authorized for detection and/or diagnosis of SARS-CoV-2 by FDA under an Emergency Use Authorization (EUA). This EUA will remain  in effect (meaning this test can be used) for the duration of the COVID-19 declaration under Section 56 4(b)(1) of the Act, 21 U.S.C. section 360bbb-3(b)(1), unless the authorization is terminated or revoked sooner. Performed at [facility], [address] [place], [place] [address]          Radiology Studies: MR BRAIN WO CONTRAST  Result Date: 04/27/2019 CLINICAL DATA:  Encephalopathy EXAM: MRI HEAD WITHOUT CONTRAST TECHNIQUE: Multiplanar, multiecho pulse sequences of the brain and surrounding structures were obtained without intravenous contrast. COMPARISON:  Head CT April 24, 2019 FINDINGS: The study is degraded by motion. Brain: No acute infarction, hemorrhage, hydrocephalus, extra-axial collection or mass lesion. Moderate amount of scattered and confluent T2 hyperintensity are seen within the white matter of the cerebral hemispheres, nonspecific, most likely related to chronic small vessel ischemia. 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 The significant prominence of the ventricular system and cerebral sulci reflecting parenchymal volume loss. Vascular: Normal flow voids. Skull and upper cervical spine: Normal marrow signal. Sinuses/Orbits: Negative. Other: None. IMPRESSION: 1. No acute intracranial abnormality. 2. Moderate parenchymal volume loss and probable sequela of chronic small vessel ischemia. 3. 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 Electronically Signed   By: Baldemar Lenis M.D.   On: 04/27/2019 11:04        Scheduled Meds: . busPIRone  10 mg Oral BID  . clopidogrel  75 mg Oral Daily  . folic acid  1 mg Oral Daily  . insulin aspart  0-6 Units Subcutaneous TID WC  . metoprolol tartrate  25 mg Oral BID  . multivitamin with minerals  1 tablet Oral Daily  . multivitamin with minerals  1 tablet Oral Daily  . pantoprazole  40 mg Oral Daily  . sertraline  100 mg Oral Daily  . sodium chloride flush  3 mL Intravenous Q12H  . thiamine  100 mg Oral Daily   Or  . thiamine  100 mg Intravenous Daily  . thiamine  100 mg Intramuscular Once  . [START ON 04/29/2019] thiamine  100 mg Oral Daily   Continuous Infusions: . sodium chloride    . sodium chloride 50 mL/hr at 04/27/19 2204     LOS: 4 days    Time spent: 30 minutes    Shanise Balch Hoover Brunette, DO [facility] Pager [phone]  If 7PM-7AM, please contact  night-coverage [email or web address] Password [facility] 04/28/2019, 10:55 AM

## 2019-04-29 LAB — GLUCOSE, CAPILLARY
Glucose-Capillary: 95 mg/dL (ref 70–99)
Glucose-Capillary: 102 mg/dL — ABNORMAL HIGH (ref 70–99)
Glucose-Capillary: 108 mg/dL — ABNORMAL HIGH (ref 70–99)
Glucose-Capillary: 127 mg/dL — ABNORMAL HIGH (ref 70–99)

## 2019-04-29 NOTE — Progress Notes (Signed)
PROGRESS NOTE    Jon Bennett  PXT:[id number] DOB: Sep 30, 1934 DOA: 04/24/2019 PCP: [name], MD   Brief Narrative:  Per HPI: Jon Bennett a 85 y.o.malewith medical history significantfor long history of heavy alcohol consumption, chronic alcoholic pancreatitis, diabetes mellitus, GERD, hypertension and peripheral vascular disease reportedly consumes half a pint of whiskey daily. He has not had alcohol in the last 2 days. He told his family members that he felt like he was going to die. He started having tremors. They brought him to the emergency department by EMS for evaluation. He has been complaining of headaches. He is also having nausea and vomiting that started about 2 hours prior to arrival. He has been agitated and fidgeting. The patient's family says that he is in denial about his alcohol problem.  2/10:Patient was admitted with acute alcohol withdrawal symptoms with elevated CIWA protocol. He was given some Haldol and IV hydration as needed. He appears somnolent this morning and otherwise nonconversational. Son is at bedside and concerned about his chronic drinking issue and feels he may need some rehabilitation. Will have PT evaluate him in the morning once more alert.  2/11:Patient continues to remain somewhat confused and somnolent this morning. PT evaluation currently pending. He has been started on dysphagia 1 diet. Will transition IV metoprolol to oral and resume his home BuSpar and switch to oral Protonix today as well. Anticipate discharge once mentation has further improved and patient has been evaluated by PT.  2/12: Patient has periods of somnolence as well as some agitation. Will resume home BuSpar at half his usual dose and give Ativan as needed. PT evaluation with recommendations for SNF. SLP recommending dysphagia 1 diet.  2/13: Patient continues to have periods of agitation and somnolence.  We will try Librium protocol today and have Ativan  only as needed and see if this seems to help a little bit better with control of his symptoms.  BuSpar increased to regular home dose at this point.  2/14: Patient appears to be doing quite well this morning and is more alert and oriented and conversational.  He has been doing well since yesterday afternoon.  We will continue current management and DC IV fluid as well as telemetry and allow up to chair.  Plan for discharge to SNF by tomorrow if stable.   Assessment & Plan:   Principal Problem:   Acute hyperactive alcohol withdrawal delirium (HCC) Active Problems:   Alcohol withdrawal (HCC)   Thrombocytopenia (HCC)   Diabetes mellitus without complication (HCC)   Hypertension   GERD (gastroesophageal reflux disease)   1. Acute alcohol withdrawal-the patient presented with a very high CIWA score greater than 20.He appears to not be having any further withdrawal symptoms and his vital signs are stable. He appears to be more somnolent from Ativan that was given at 2100 on 2/9. Will avoid further doses and monitor for increased alertness. PT evaluationwith recommendation for SNFWill resume home BuSpar at usual dose. Started on dysphagia 1 diet per SLP. Repeat brain MRI with no acute findings and no significant abnormalities on venous blood gas. Ammonia levels are unremarkable.  We will try Librium protocol on CIWA.  He has overall been improving. 2. Chronic alcoholism-when patient medically stabilized which has Child psychotherapist to see patient for consideration of alcohol treatment program. 3. Thrombocytopenia-stable. Likely secondary to liver damage from chronic alcoholism. Holding heparin products. Follow.Repeat CBC in a.m. 4. GERD-Protonix ordered for GI protection. 5. Diabetes mellitustype 2 - mostly diet controlled, will follow  CBG and sensitive SSI only if needed.   DVT prophylaxis:SCDs Code Status:Full Family Communication:Son, Mark at bedside, discussed onphone  2/12 Disposition Plan:Continue current managementand monitor carefully. Plan for discharge to rehabilitation facility once improved hopefully by a.m.   Consultants:  None  Procedures:  See imaging below  Antimicrobials:   None  Subjective: Patient seen and evaluated today with no new acute complaints or concerns. No acute concerns or events noted overnight.  He has been more alert and conversational.  Objective: Vitals:   04/29/19 0000 04/29/19 0025 04/29/19 0557 04/29/19 0852  BP:   (!) 132/59 138/62  Pulse:   74 80  Resp: (!) 22 20 18    Temp:   98.1 F (36.7 C)   TempSrc:   Oral   SpO2:   97%   Weight:      Height:        Intake/Output Summary (Last 24 hours) at 04/29/2019 1005 Last data filed at 04/29/2019 0900 Gross per 24 hour  Intake 90 ml  Output 200 ml  Net -110 ml   Filed Weights   04/24/19 0054 04/24/19 1100  Weight: 70.3 kg 69.3 kg    Examination:  General exam: Appears calm and comfortable  Respiratory system: Clear to auscultation. Respiratory effort normal. Cardiovascular system: S1 & S2 heard, RRR. No JVD, murmurs, rubs, gallops or clicks. No pedal edema. Gastrointestinal system: Abdomen is nondistended, soft and nontender. No organomegaly or masses felt. Normal bowel sounds heard. Central nervous system: Alert and awake.  Conversational. Extremities: Symmetric 5 x 5 power. Skin: No rashes, lesions or ulcers Psychiatry: Difficult to assess.    Data Reviewed: I have personally reviewed following labs and imaging studies  CBC: Recent Labs  Lab 04/24/19 0105 04/25/19 0526 04/26/19 0522 04/28/19 0639  WBC 7.4 11.7* 8.9 8.8  NEUTROABS 6.5  --   --   --   HGB 15.1 16.6 14.8 16.4  HCT 44.9 49.4 46.1 48.4  MCV 92.8 93.2 98.1 91.8  PLT 105* 88* 92* 90*   Basic Metabolic Panel: Recent Labs  Lab 04/24/19 0105 04/25/19 0526 04/26/19 0522 04/28/19 0639  NA 140 141 140 137  K 3.8 4.2 3.7 3.5  CL 105 106 106 100  CO2 24 26 27  24   GLUCOSE 163* 141* 113* 94  BUN 18 19 17 17   CREATININE 1.03 0.70 0.72 0.68  CALCIUM 9.5 9.3 9.1 9.4  MG  --  2.1 2.1  --   PHOS  --  2.0*  --   --    GFR: Estimated Creatinine Clearance: 69.8 mL/min (by C-G formula based on SCr of 0.68 mg/dL). Liver Function Tests: Recent Labs  Lab 04/24/19 0105 04/25/19 0526  AST 22 20  ALT 14 13  ALKPHOS 72 64  BILITOT 1.1 1.2  PROT 7.2 6.9  ALBUMIN 4.4 4.0   No results for input(s): LIPASE, AMYLASE in the last 168 hours. Recent Labs  Lab 04/27/19 1118  AMMONIA 13   Coagulation Profile: No results for input(s): INR, PROTIME in the last 168 hours. Cardiac Enzymes: No results for input(s): CKTOTAL, CKMB, CKMBINDEX, TROPONINI in the last 168 hours. BNP (last 3 results) No results for input(s): PROBNP in the last 8760 hours. HbA1C: No results for input(s): HGBA1C in the last 72 hours. CBG: Recent Labs  Lab 04/27/19 1638 04/28/19 0720 04/28/19 1113 04/28/19 1634 04/29/19 0738  GLUCAP 92 95 93 88 95   Lipid Profile: No results for input(s): CHOL, HDL, LDLCALC, TRIG, CHOLHDL, LDLDIRECT  in the last 72 hours. Thyroid Function Tests: No results for input(s): TSH, T4TOTAL, FREET4, T3FREE, THYROIDAB in the last 72 hours. Anemia Panel: No results for input(s): VITAMINB12, FOLATE, FERRITIN, TIBC, IRON, RETICCTPCT in the last 72 hours. Sepsis Labs: No results for input(s): PROCALCITON, LATICACIDVEN in the last 168 hours.  Recent Results (from the past 240 hour(s))  SARS CORONAVIRUS 2 (TAT 6-24 HRS) Nasopharyngeal Nasopharyngeal Swab     Status: None   Collection Time: 04/24/19  5:09 AM   Specimen: Nasopharyngeal Swab  Result Value Ref Range Status   SARS Coronavirus 2 NEGATIVE NEGATIVE Final    Comment: (NOTE) SARS-CoV-2 target nucleic acids are NOT DETECTED. The SARS-CoV-2 RNA is generally detectable in upper and lower respiratory specimens during the acute phase of infection. Negative results do not preclude SARS-CoV-2  infection, do not rule out co-infections with other pathogens, and should not be used as the sole basis for treatment or other patient management decisions. Negative results must be combined with clinical observations, patient history, and epidemiological information. The expected result is Negative. Fact Sheet for Patients: HairSlick.no Fact Sheet for Healthcare Providers: quierodirigir.com This test is not yet approved or cleared by the [place] FDA and  has been authorized for detection and/or diagnosis of SARS-CoV-2 by FDA under an Emergency Use Authorization (EUA). This EUA will remain  in effect (meaning this test can be used) for the duration of the COVID-19 declaration under Section 56 4(b)(1) of the Act, 21 U.S.C. section 360bbb-3(b)(1), unless the authorization is terminated or revoked sooner. Performed at [facility], [address], [place], [place] [address]          Radiology Studies: MR BRAIN WO CONTRAST  Result Date: 04/27/2019 CLINICAL DATA:  Encephalopathy EXAM: MRI HEAD WITHOUT CONTRAST TECHNIQUE: Multiplanar, multiecho pulse sequences of the brain and surrounding structures were obtained without intravenous contrast. COMPARISON:  Head CT April 24, 2019 FINDINGS: The study is degraded by motion. Brain: No acute infarction, hemorrhage, hydrocephalus, extra-axial collection or mass lesion. Moderate amount of scattered and confluent T2 hyperintensity are seen within the white matter of the cerebral hemispheres, nonspecific, most likely related to chronic small vessel ischemia. 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 The significant prominence of the ventricular system and cerebral sulci reflecting parenchymal volume loss. Vascular: Normal flow voids. Skull and upper cervical spine: Normal marrow signal. Sinuses/Orbits: Negative. Other: None. IMPRESSION:  1. No acute intracranial abnormality. 2. Moderate parenchymal volume loss and probable sequela of chronic small vessel ischemia. 3. 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 Electronically Signed   By: Baldemar Lenis M.D.   On: 04/27/2019 11:04        Scheduled Meds: . busPIRone  10 mg Oral BID  . clopidogrel  75 mg Oral Daily  . folic acid  1 mg Oral Daily  . insulin aspart  0-6 Units Subcutaneous TID WC  . metoprolol tartrate  25 mg Oral BID  . multivitamin with minerals  1 tablet Oral Daily  . pantoprazole  40 mg Oral Daily  . sertraline  100 mg Oral Daily  . sodium chloride flush  3 mL Intravenous Q12H  . thiamine  100 mg Oral Daily   Or  . thiamine  100 mg Intravenous Daily  . thiamine  100 mg Intramuscular Once  . thiamine  100 mg Oral Daily   Continuous Infusions: . sodium chloride  LOS: 5 days    Time spent: 30 minutes    Tamarius Rosenfield Hoover Brunette, DO [facility] Pager [phone]  If 7PM-7AM, please contact night-coverage [email or web address] Password [facility] 04/29/2019, 10:05 AM

## 2019-04-29 NOTE — Progress Notes (Signed)
  Speech Language Pathology Treatment: Dysphagia  Patient Details Name: Jon Bennett MRN: [id number] DOB: Nov 26, 1934 Today's Date: 04/29/2019 Time: 1829-9371 SLP Time Calculation (min) (ACUTE ONLY): 23 min  Assessment / Plan / Recommendation Clinical Impression  Pt seen for ongoing diagnostic dysphagia intervention. He is much more alert this date and is reportedly tolerating diet well. Pt is edentulous and states that he typically wears upper dentures for all meals, however SLP could not locate in room. Pt with prolonged oral prep and mild oral holding with solids and suspected delay in swallow initiation with one delayed cough after straw sips thin (over 240cc). SLP encouraged Pt to sit fully upright for all eating and drinking, take small sips of liquids, and alternate solids/liquids as needed. Will advance to D2 and thin liquids. Suspect advancement to mechanical soft if dentures can be located.    HPI HPI: Jon Bennett a 85 y.o.malewith medical history significantfor long history of heavy alcohol consumption, chronic alcoholic pancreatitis, diabetes mellitus, GERD, hypertension and peripheral vascular disease reportedly consumes half a pint of whiskey daily. He has not had alcohol in the last 2 days. He told his family members that he felt like he was going to die. He started having tremors. They brought him to the emergency department by EMS for evaluation. He has been complaining of headaches. He is also having nausea and vomiting that started about 2 hours prior to arrival. He has been agitated and fidgeting. The patient's family says that he is in denial about his alcohol problem. Patient was admitted with acute alcohol withdrawal symptoms with elevated CIWA protocol.  He was given some Haldol and IV hydration as needed.  He appears somnolent this morning and otherwise nonconversational.  Son is at bedside and concerned about his chronic drinking issue and feels he may need some  rehabilitation. BSE requested.      SLP Plan  Continue with current plan of care       Recommendations  Diet recommendations: Dysphagia 2 (fine chop);Thin liquid Liquids provided via: Cup;Straw Medication Administration: Whole meds with puree Supervision: Staff to assist with self feeding Compensations: Minimize environmental distractions;Slow rate;Small sips/bites;Follow solids with liquid Postural Changes and/or Swallow Maneuvers: Seated upright 90 degrees;Upright 30-60 min after meal                Oral Care Recommendations: Oral care BID Follow up Recommendations: 24 hour supervision/assistance;Skilled Nursing facility SLP Visit Diagnosis: Dysphagia, unspecified (R13.10) Plan: Continue with current plan of care       Thank you,  Jon Bennett, CCC-SLP [phone]                 Jon Bennett 04/29/2019, 2:33 PM

## 2019-04-29 NOTE — Progress Notes (Signed)
Central tele called reported that patient had 9 beats of Vtach, now in NSR. On call  MD has been notified, no new orders.

## 2019-04-30 LAB — CBC
HCT: 42.3 % (ref 39.0–52.0)
Hemoglobin: 14.3 g/dL (ref 13.0–17.0)
MCH: 31 pg (ref 26.0–34.0)
MCHC: 33.8 g/dL (ref 30.0–36.0)
MCV: 91.6 fL (ref 80.0–100.0)
Platelets: 128 10*3/uL — ABNORMAL LOW (ref 150–400)
RBC: 4.62 MIL/uL (ref 4.22–5.81)
RDW: 13.5 % (ref 11.5–15.5)
WBC: 9.6 10*3/uL (ref 4.0–10.5)
nRBC: 0 % (ref 0.0–0.2)

## 2019-04-30 LAB — GLUCOSE, CAPILLARY
Glucose-Capillary: 116 mg/dL — ABNORMAL HIGH (ref 70–99)
Glucose-Capillary: 126 mg/dL — ABNORMAL HIGH (ref 70–99)
Glucose-Capillary: 119 mg/dL — ABNORMAL HIGH (ref 70–99)

## 2019-04-30 MED ORDER — CHLORDIAZEPOXIDE HCL 25 MG PO CAPS
25.0000 mg | ORAL_CAPSULE | Freq: Three times a day (TID) | ORAL | Status: DC
  Administered 2019-04-30 (×3): 25 mg via ORAL
  Filled 2019-04-30 (×3): qty 1

## 2019-04-30 NOTE — Progress Notes (Signed)
Patient knew name, birthday, that he was in hospital for drinking and was off by one day on date. Was given bed bath and external catheter placed with mitts to keep in place.  Sacral dressing placed.

## 2019-04-30 NOTE — TOC Progression Note (Signed)
Transition of Care [facility]) - Progression Note    Patient Details  Name: Jon Bennett MRN: [id number] Date of Birth: 06/16/1934  Transition of Care [facility]) CM/SW Contact  Ewing Schlein, LCSW Phone Number: 04/30/2019, 2:59 PM  Clinical Narrative:  Spoke with patient's son, Jon Bennett, regarding patient being referred for SNF. Received verbal permission to send referrals for SNF. FL2 completed and referral sent to SNF.  Expected Discharge Plan: Home w Home Health Services Barriers to Discharge: Continued Medical Work up  Expected Discharge Plan and Services Expected Discharge Plan: Home w Home Health Services   Living arrangements for the past 2 months: Single Family Home     Readmission Risk Interventions No flowsheet data found.

## 2019-04-30 NOTE — Progress Notes (Signed)
PROGRESS NOTE    Jon Bennett  MWU:[id number] DOB: 1934-11-14 DOA: 04/24/2019 PCP: [name], MD   Brief Narrative:  Per HPI: Jon Bennett a 85 y.o.malewith medical history significantfor long history of heavy alcohol consumption, chronic alcoholic pancreatitis, diabetes mellitus, GERD, hypertension and peripheral vascular disease reportedly consumes half a pint of whiskey daily. He has not had alcohol in the last 2 days. He told his family members that he felt like he was going to die. He started having tremors. They brought him to the emergency department by EMS for evaluation. He has been complaining of headaches. He is also having nausea and vomiting that started about 2 hours prior to arrival. He has been agitated and fidgeting. The patient's family says that he is in denial about his alcohol problem.  2/10:Patient was admitted with acute alcohol withdrawal symptoms with elevated CIWA protocol. He was given some Haldol and IV hydration as needed. He appears somnolent this morning and otherwise nonconversational. Son is at bedside and concerned about his chronic drinking issue and feels he may need some rehabilitation. Will have PT evaluate him in the morning once more alert.  2/11:Patient continues to remain somewhat confused and somnolent this morning. PT evaluation currently pending. He has been started on dysphagia 1 diet. Will transition IV metoprolol to oral and resume his home BuSpar and switch to oral Protonix today as well. Anticipate discharge once mentation has further improved and patient has been evaluated by PT.  2/12: Patient has periods of somnolence as well as some agitation. Will resume home BuSpar at half his usual dose and give Ativan as needed. PT evaluation with recommendations for SNF. SLP recommending dysphagia 1 diet.  2/13:Patient continues to have periods of agitation and somnolence. We will try Librium protocol today and have Ativan  only as needed and see if this seems to help a little bit better with control of his symptoms. BuSpar increased to regular home dose at this point.  2/14: Patient appears to be doing quite well this morning and is more alert and oriented and conversational.  He has been doing well since yesterday afternoon.  We will continue current management and DC IV fluid as well as telemetry and allow up to chair.  Plan for discharge to SNF by tomorrow if stable.  2/15: Patient appears more alert and oriented this morning, however last night and early this morning he became quite agitated requiring IV Haldol and Ativan.  Plan will be to schedule Librium 3 times a day today and then taper as tolerated.  Assessment & Plan:   Principal Problem:   Acute hyperactive alcohol withdrawal delirium (HCC) Active Problems:   Alcohol withdrawal (HCC)   Thrombocytopenia (HCC)   Diabetes mellitus without complication (HCC)   Hypertension   GERD (gastroesophageal reflux disease)   1. Acute alcohol withdrawal-the patient presented with a very high CIWA score greater than 20.He appears to not be having any further withdrawal symptoms and his vital signs are stable. He appears to be more somnolent from Ativan that was given at 2100 on 2/9. Will avoid further doses and monitor for increased alertness. PT evaluationwith recommendation for SNFWill resume home BuSparat usual dose. Started on dysphagia 1 diet per SLP. Repeat brain MRI with no acute findings and no significant abnormalities on venous blood gas. Ammonia levelsare unremarkable. He has still required IV Ativan and Haldol for some agitation.  We will plan for Librium scheduled dosing today and gradual taper with hopeful plans for discharge to  rehabilitation facility by tomorrow. 2. Chronic alcoholism-when patient medically stabilized which has Education officer, museum to see patient for consideration of alcohol treatment program. 3. Thrombocytopenia-stable.  Likely secondary to liver damage from chronic alcoholism. Holding heparin products. Follow.Repeat CBC in a.m. 4. GERD-Protonix ordered for GI protection. 5. Diabetes mellitustype 2 - mostly diet controlled, will follow CBG and sensitive SSI only if needed.   DVT prophylaxis:SCDs Code Status:Full Family Communication:Son, Jon Bennett at bedside, discussed onphone 2/12 Disposition Plan:Continue current managementand monitor carefully. Plan for discharge to rehabilitation facility once improved hopefully by a.m. start on Librium taper protocol today.   Consultants:  None  Procedures:  See imaging below  Antimicrobials:   None  Subjective: Patient seen and evaluated today with no new acute complaints or concerns.  He is more alert and oriented this morning, however overnight he has had periods of agitation requiring IV Haldol and Ativan.  Objective: Vitals:   04/29/19 1803 04/29/19 2130 04/30/19 0700 04/30/19 1128  BP: 128/75 133/74 132/66 116/74  Pulse: 86 90 90 79  Resp: 16  20 16   Temp:  98.9 F (37.2 C) 99.5 F (37.5 C) 99.1 F (37.3 C)  TempSrc:  Oral Oral Oral  SpO2: 97% 98% 97% 94%  Weight:      Height:        Intake/Output Summary (Last 24 hours) at 04/30/2019 1228 Last data filed at 04/30/2019 0900 Gross per 24 hour  Intake 120 ml  Output --  Net 120 ml   Filed Weights   04/24/19 0054 04/24/19 1100  Weight: 70.3 kg 69.3 kg    Examination:  General exam: Appears calm and comfortable  Respiratory system: Clear to auscultation. Respiratory effort normal. Cardiovascular system: S1 & S2 heard, RRR. No JVD, murmurs, rubs, gallops or clicks. No pedal edema. Gastrointestinal system: Abdomen is nondistended, soft and nontender. No organomegaly or masses felt. Normal bowel sounds heard. Central nervous system: Alert and awake Extremities: No edema Skin: No rashes, lesions or ulcers Psychiatry: Difficult to assess    Data Reviewed: I have  personally reviewed following labs and imaging studies  CBC: Recent Labs  Lab 04/24/19 0105 04/25/19 0526 04/26/19 0522 04/28/19 0639 04/30/19 0621  WBC 7.4 11.7* 8.9 8.8 9.6  NEUTROABS 6.5  --   --   --   --   HGB 15.1 16.6 14.8 16.4 14.3  HCT 44.9 49.4 46.1 48.4 42.3  MCV 92.8 93.2 98.1 91.8 91.6  PLT 105* 88* 92* 90* 947*   Basic Metabolic Panel: Recent Labs  Lab 04/24/19 0105 04/25/19 0526 04/26/19 0522 04/28/19 0639  NA 140 141 140 137  K 3.8 4.2 3.7 3.5  CL 105 106 106 100  CO2 24 26 27 24   GLUCOSE 163* 141* 113* 94  BUN 18 19 17 17   CREATININE 1.03 0.70 0.72 0.68  CALCIUM 9.5 9.3 9.1 9.4  MG  --  2.1 2.1  --   PHOS  --  2.0*  --   --    GFR: Estimated Creatinine Clearance: 69.8 mL/min (by C-G formula based on SCr of 0.68 mg/dL). Liver Function Tests: Recent Labs  Lab 04/24/19 0105 04/25/19 0526  AST 22 20  ALT 14 13  ALKPHOS 72 64  BILITOT 1.1 1.2  PROT 7.2 6.9  ALBUMIN 4.4 4.0   No results for input(s): LIPASE, AMYLASE in the last 168 hours. Recent Labs  Lab 04/27/19 1118  AMMONIA 13   Coagulation Profile: No results for input(s): INR, PROTIME in the last 168  hours. Cardiac Enzymes: No results for input(s): CKTOTAL, CKMB, CKMBINDEX, TROPONINI in the last 168 hours. BNP (last 3 results) No results for input(s): PROBNP in the last 8760 hours. HbA1C: No results for input(s): HGBA1C in the last 72 hours. CBG: Recent Labs  Lab 04/29/19 1203 04/29/19 1615 04/29/19 2132 04/30/19 0817 04/30/19 1137  GLUCAP 102* 108* 127* 116* 126*   Lipid Profile: No results for input(s): CHOL, HDL, LDLCALC, TRIG, CHOLHDL, LDLDIRECT in the last 72 hours. Thyroid Function Tests: No results for input(s): TSH, T4TOTAL, FREET4, T3FREE, THYROIDAB in the last 72 hours. Anemia Panel: No results for input(s): VITAMINB12, FOLATE, FERRITIN, TIBC, IRON, RETICCTPCT in the last 72 hours. Sepsis Labs: No results for input(s): PROCALCITON, LATICACIDVEN in the last 168  hours.  Recent Results (from the past 240 hour(s))  SARS CORONAVIRUS 2 (TAT 6-24 HRS) Nasopharyngeal Nasopharyngeal Swab     Status: None   Collection Time: 04/24/19  5:09 AM   Specimen: Nasopharyngeal Swab  Result Value Ref Range Status   SARS Coronavirus 2 NEGATIVE NEGATIVE Final    Comment: (NOTE) SARS-CoV-2 target nucleic acids are NOT DETECTED. The SARS-CoV-2 RNA is generally detectable in upper and lower respiratory specimens during the acute phase of infection. Negative results do not preclude SARS-CoV-2 infection, do not rule out co-infections with other pathogens, and should not be used as the sole basis for treatment or other patient management decisions. Negative results must be combined with clinical observations, patient history, and epidemiological information. The expected result is Negative. Fact Sheet for Patients: HairSlick.no Fact Sheet for Healthcare Providers: quierodirigir.com This test is not yet approved or cleared by the [place] FDA and  has been authorized for detection and/or diagnosis of SARS-CoV-2 by FDA under an Emergency Use Authorization (EUA). This EUA will remain  in effect (meaning this test can be used) for the duration of the COVID-19 declaration under Section 56 4(b)(1) of the Act, 21 U.S.C. section 360bbb-3(b)(1), unless the authorization is terminated or revoked sooner. Performed at [facility], [address], [place], [place] [address]          Radiology Studies: No results found.      Scheduled Meds: . busPIRone  10 mg Oral BID  . chlordiazePOXIDE  25 mg Oral TID  . clopidogrel  75 mg Oral Daily  . folic acid  1 mg Oral Daily  . insulin aspart  0-6 Units Subcutaneous TID WC  . metoprolol tartrate  25 mg Oral BID  . multivitamin with minerals  1 tablet Oral Daily  . pantoprazole  40 mg Oral Daily  . sertraline  100 mg Oral Daily  . sodium chloride flush  3  mL Intravenous Q12H  . thiamine  100 mg Oral Daily   Or  . thiamine  100 mg Intravenous Daily  . thiamine  100 mg Intramuscular Once  . thiamine  100 mg Oral Daily   Continuous Infusions: . sodium chloride       LOS: 6 days    Time spent: 30 minutes    Luisantonio Adinolfi Hoover Brunette, DO [facility] Pager [phone]  If 7PM-7AM, please contact night-coverage [email or web address] Password [facility] 04/30/2019, 12:28 PM

## 2019-04-30 NOTE — Progress Notes (Signed)
Physical Therapy Treatment Patient Details Name: Jon Bennett MRN: [id number] DOB: 1934/08/20 Today's Date: 04/30/2019    History of Present Illness Jon Bennett is a 85 y.o. male with medical history significant for long history of heavy alcohol consumption, chronic alcoholic pancreatitis, diabetes mellitus, GERD, hypertension and peripheral vascular disease reportedly consumes half a pint of whiskey daily.  He has not had alcohol in the last 2 days.  He told his family members that he felt like he was going to die.  He started having tremors.  They brought him to the emergency department by EMS for evaluation.  He has been complaining of headaches.  He is also having nausea and vomiting that started about 2 hours prior to arrival.  He has been agitated and fidgeting.  The patient's family says that he is in denial about his alcohol problem    PT Comments    Patient presents with mittens to bilateral hands, c/o severe pain BLE with any pressure especially feet and knees, required Max assist to sit up at bedside and unable to attempt sit to stands due to poor tolerance for weightbearing on feet.  Patient tolerated sitting up at bedside for approximately 15-20 minutes, required active assistance to complete BLE ROM/strengthening exercises due to stiffness and pain.  Patient put back to bed with Max/total assistance to reposition.  Patient will benefit from continued physical therapy in hospital and recommended venue below to increase strength, balance, endurance for safe ADLs and gait.   Follow Up Recommendations  SNF     Equipment Recommendations  None recommended by PT    Recommendations for Other Services       Precautions / Restrictions Precautions Precautions: Fall Restrictions Weight Bearing Restrictions: No    Mobility  Bed Mobility Overal bed mobility: Needs Assistance Bed Mobility: Supine to Sit;Sit to Supine     Supine to sit: Max assist Sit to supine: Max assist    General bed mobility comments: limited due to c/o severe pain BLE and stiffness  Transfers                    Ambulation/Gait                 Stairs             Wheelchair Mobility    Modified Rankin (Stroke Patients Only)       Balance Overall balance assessment: Needs assistance Sitting-balance support: Feet supported;No upper extremity supported Sitting balance-Leahy Scale: Fair Sitting balance - Comments: seated at bedside                                    Cognition Arousal/Alertness: Awake/alert Behavior During Therapy: WFL for tasks assessed/performed Overall Cognitive Status: Impaired/Different from baseline Area of Impairment: Safety/judgement;Awareness                         Safety/Judgement: Decreased awareness of safety;Decreased awareness of deficits Awareness: Emergent   General Comments: able to follow directions with repeated verbal/tactile cueing      Exercises General Exercises - Lower Extremity Ankle Circles/Pumps: Seated;AROM;Strengthening;Both;5 reps Long Arc Quad: Seated;AAROM;Strengthening;Both;10 reps Hip Flexion/Marching: Seated;AAROM;Both;10 reps;Strengthening    General Comments        Pertinent Vitals/Pain Pain Assessment: Faces Faces Pain Scale: Hurts whole lot Pain Location: bilateral feet, knees with any pressure Pain Descriptors / Indicators: Sore;Grimacing;Guarding Pain Intervention(s): Limited  activity within patient's tolerance;Monitored during session;Repositioned    Home Living                      Prior Function            PT Goals (current goals can now be found in the care plan section) Acute Rehab PT Goals Patient Stated Goal: return home with family to assist PT Goal Formulation: With patient Time For Goal Achievement: 05/11/19 Potential to Achieve Goals: Fair Progress towards PT goals: Progressing toward goals    Frequency    Min 3X/week       PT Plan      Co-evaluation              AM-PAC PT "6 Clicks" Mobility   Outcome Measure  Help needed turning from your back to your side while in a flat bed without using bedrails?: A Lot Help needed moving from lying on your back to sitting on the side of a flat bed without using bedrails?: A Lot Help needed moving to and from a bed to a chair (including a wheelchair)?: Total Help needed standing up from a chair using your arms (e.g., wheelchair or bedside chair)?: Total Help needed to walk in hospital room?: Total Help needed climbing 3-5 steps with a railing? : Total 6 Click Score: 8    End of Session   Activity Tolerance: Patient limited by fatigue;Patient limited by pain Patient left: in bed;with call bell/phone within reach;with bed alarm set Nurse Communication: Mobility status PT Visit Diagnosis: Other abnormalities of gait and mobility (R26.89);Unsteadiness on feet (R26.81);Muscle weakness (generalized) (M62.81)     Time: 6389-3734 PT Time Calculation (min) (ACUTE ONLY): 28 min  Charges:  $Therapeutic Exercise: 8-22 mins $Therapeutic Activity: 8-22 mins                     3:25 PM, 04/30/19 Lonell Grandchild, MPT Physical Therapist with [facility] 336 [phone] office [phone] mobile phone

## 2019-05-01 LAB — GLUCOSE, CAPILLARY
Glucose-Capillary: 120 mg/dL — ABNORMAL HIGH (ref 70–99)
Glucose-Capillary: 121 mg/dL — ABNORMAL HIGH (ref 70–99)
Glucose-Capillary: 115 mg/dL — ABNORMAL HIGH (ref 70–99)

## 2019-05-01 MED ORDER — FOLIC ACID 1 MG PO TABS: 1.0000 mg | ORAL_TABLET | Freq: Every day | ORAL | 0 refills | Status: AC

## 2019-05-01 MED ORDER — CHLORDIAZEPOXIDE HCL 25 MG PO CAPS: 25.0000 mg | ORAL_CAPSULE | Freq: Four times a day (QID) | ORAL | 0 refills | Status: DC | PRN

## 2019-05-01 MED ORDER — CHLORDIAZEPOXIDE HCL 25 MG PO CAPS
25.0000 mg | ORAL_CAPSULE | Freq: Two times a day (BID) | ORAL | Status: DC
  Administered 2019-05-01: 25 mg via ORAL
  Filled 2019-05-01: qty 1

## 2019-05-01 MED ORDER — BUSPIRONE HCL 10 MG PO TABS: 10.0000 mg | ORAL_TABLET | Freq: Two times a day (BID) | ORAL | 0 refills | Status: AC

## 2019-05-01 MED ORDER — CHLORDIAZEPOXIDE HCL 25 MG PO CAPS: ORAL_CAPSULE | ORAL | 0 refills | Status: AC

## 2019-05-01 MED ORDER — BACLOFEN 10 MG PO TABS: 10.0000 mg | ORAL_TABLET | Freq: Every day | ORAL | 0 refills | Status: DC | PRN

## 2019-05-01 MED ORDER — THIAMINE HCL 100 MG PO TABS: 100.0000 mg | ORAL_TABLET | Freq: Every day | ORAL | 0 refills | Status: AC

## 2019-05-01 NOTE — Discharge Summary (Signed)
Physician Discharge Summary  Jon Bennett GEZ:[id number] DOB: Aug 06, 1934 DOA: 04/24/2019  PCP: Mertha Baars, MD  Admit date: 04/24/2019  Discharge date: 05/01/2019  Admitted From:Home  Disposition:  SNF  Recommendations for Outpatient Follow-up:  1. Follow up with PCP in 1-2 weeks 2. Continue on Librium taper for 4 more days as prescribed and use Librium as needed for CIWA greater than 10 3. Continue on folate and thiamine as prescribed  Home Health: None  Equipment/Devices: None  Discharge Condition: Stable  CODE STATUS: Full  Diet recommendation: Dysphagia 2  Brief/Interim Summary: Per HPI: Jon Bennett a 85 y.o.malewith medical history significantfor long history of heavy alcohol consumption, chronic alcoholic pancreatitis, diabetes mellitus, GERD, hypertension and peripheral vascular disease reportedly consumes half a pint of whiskey daily. He has not had alcohol in the last 2 days. He told his family members that he felt like he was going to die. He started having tremors. They brought him to the emergency department by EMS for evaluation. He has been complaining of headaches. He is also having nausea and vomiting that started about 2 hours prior to arrival. He has been agitated and fidgeting. The patient's family says that he is in denial about his alcohol problem.  2/10:Patient was admitted with acute alcohol withdrawal symptoms with elevated CIWA protocol. He was given some Haldol and IV hydration as needed. He appears somnolent this morning and otherwise nonconversational. Son is at bedside and concerned about his chronic drinking issue and feels he may need some rehabilitation. Will have PT evaluate him in the morning once more alert.  2/11:Patient continues to remain somewhat confused and somnolent this morning. PT evaluation currently pending. He has been started on dysphagia 1 diet. Will transition IV metoprolol to oral and resume his home BuSpar and  switch to oral Protonix today as well. Anticipate discharge once mentation has further improved and patient has been evaluated by PT.  2/12: Patient has periods of somnolence as well as some agitation. Will resume home BuSpar at half his usual dose and give Ativan as needed. PT evaluation with recommendations for SNF. SLP recommending dysphagia 1 diet.  2/13:Patient continues to have periods of agitation and somnolence. We will try Librium protocol today and have Ativan only as needed and see if this seems to help a little bit better with control of his symptoms. BuSpar increased to regular home dose at this point.  2/14:Patient appears to be doing quite well this morning and is more alert and oriented and conversational. He has been doing well since yesterday afternoon. We will continue current management and DC IV fluid as well as telemetry and allow up to chair. Plan for discharge to SNF by tomorrow if stable.  2/15: Patient appears more alert and oriented this morning, however last night and early this morning he became quite agitated requiring IV Haldol and Ativan.  Plan will be to schedule Librium 3 times a day today and then taper as tolerated.  2/16: Patient is alert and oriented and has remained so over the last 24 hours with no acute events, agitation, or behavioral issues.  He is doing well on his Librium taper which will be continued for 4 more days.  He is to remain on medications as otherwise prescribed.  He has stable for discharge to rehab facility at this time.  Discharge Diagnoses:  Principal Problem:   Acute hyperactive alcohol withdrawal delirium ([place]) Active Problems:   Alcohol withdrawal (HCC)   Thrombocytopenia (HCC)   Diabetes mellitus  without complication (HCC)   Hypertension   GERD (gastroesophageal reflux disease)  Principal discharge diagnosis: Delirium secondary to acute alcohol withdrawal in the setting of chronic alcoholism with associated  thrombocytopenia.  Discharge Instructions  Discharge Instructions    Diet - low sodium heart healthy   Complete by: As directed    Increase activity slowly   Complete by: As directed      Allergies as of 05/01/2019      Reactions   Asa [aspirin] Shortness Of Breath      Medication List    STOP taking these medications   doxepin 25 MG capsule Commonly known as: SINEQUAN     TAKE these medications   acetaminophen 500 MG tablet Commonly known as: TYLENOL Take 500 mg by mouth daily as needed for mild pain or moderate pain.   baclofen 10 MG tablet Commonly known as: LIORESAL Take 1 tablet (10 mg total) by mouth daily as needed for muscle spasms.   busPIRone 10 MG tablet Commonly known as: BUSPAR Take 1 tablet (10 mg total) by mouth 2 (two) times daily.   chlordiazePOXIDE 25 MG capsule Commonly known as: LIBRIUM Take 1 capsule (25 mg total) by mouth every 6 (six) hours as needed for withdrawal (CIWA score > 10).   chlordiazePOXIDE 25 MG capsule Commonly known as: LIBRIUM Take 1 capsule (25 mg total) by mouth 2 (two) times daily for 2 days, THEN 1 capsule (25 mg total) daily for 2 days. Start taking on: May 01, 2019   clopidogrel 75 MG tablet Commonly known as: PLAVIX Take 75 mg by mouth daily.   famotidine 20 MG tablet Commonly known as: PEPCID Take 20 mg by mouth 2 (two) times daily.   folic acid 1 MG tablet Commonly known as: FOLVITE Take 1 tablet (1 mg total) by mouth daily. Start taking on: May 02, 2019   metoprolol tartrate 25 MG tablet Commonly known as: LOPRESSOR Take 1 tablet (25 mg total) by mouth 2 (two) times daily.   thiamine 100 MG tablet Take 1 tablet (100 mg total) by mouth daily. Start taking on: May 02, 2019   Vitamin D (Ergocalciferol) 1.25 MG (50000 UNIT) Caps capsule Commonly known as: DRISDOL Take 50,000 Units by mouth every 7 (seven) days.       Contact information for follow-up providers    [name], MD  Follow up in 2 week(s).   Specialty: Family Medicine Contact information: [address] [place] [address] [phone]            Contact information for after-discharge care    Destination    [facility] .   Service: Skilled Nursing Contact information: [address] [place] [address] [phone]                 Allergies  Allergen Reactions  . Asa [Aspirin] Shortness Of Breath    Consultations:  None   Procedures/Studies: CT Head Wo Contrast  Result Date: 04/24/2019 CLINICAL DATA:  ETOH withdrawal, altered mental status, tremors EXAM: CT HEAD WITHOUT CONTRAST TECHNIQUE: Contiguous axial images were obtained from the base of the skull through the vertex without intravenous contrast. COMPARISON:  CT head Aug 11, 2018 FINDINGS: Technical: Motion degraded imaging quality despite the use of multiple acquisitions. 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 No evidence of acute infarction, hemorrhage, hydrocephalus, extra-axial collection or mass lesion/mass effect. Symmetric prominence of the ventricles, cisterns and sulci  compatible with parenchymal volume loss. Patchy areas of white matter hypoattenuation are most compatible with chronic microvascular angiopathy. Vascular: Atherosclerotic calcification of the carotid siphons. No hyperdense vessel. Skull: Remote bilateral nasal bone deformities. No visible calvarial fracture within the limitations of this examination. No scalp swelling or hematoma. Sinuses/Orbits: Paranasal sinuses and mastoid air cells are predominantly clear. Other: None IMPRESSION: 1. Motion degraded imaging quality despite the use of multiple acquisitions. 2. No CT evidence of definite acute intracranial pathology. 3. 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 4. Features of chronic parenchymal volume loss  and white matter angiopathy. Electronically Signed   By: Kreg Shropshire M.D.   On: 04/24/2019 04:32   MR BRAIN WO CONTRAST  Result Date: 04/27/2019 CLINICAL DATA:  Encephalopathy EXAM: MRI HEAD WITHOUT CONTRAST TECHNIQUE: Multiplanar, multiecho pulse sequences of the brain and surrounding structures were obtained without intravenous contrast. COMPARISON:  Head CT April 24, 2019 FINDINGS: The study is degraded by motion. Brain: No acute infarction, hemorrhage, hydrocephalus, extra-axial collection or mass lesion. Moderate amount of scattered and confluent T2 hyperintensity are seen within the white matter of the cerebral hemispheres, nonspecific, most likely related to chronic small vessel ischemia. 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 The significant prominence of the ventricular system and cerebral sulci reflecting parenchymal volume loss. Vascular: Normal flow voids. Skull and upper cervical spine: Normal marrow signal. Sinuses/Orbits: Negative. Other: None. IMPRESSION: 1. No acute intracranial abnormality. 2. Moderate parenchymal volume loss and probable sequela of chronic small vessel ischemia. 3. 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 Electronically Signed   By: Baldemar Lenis M.D.   On: 04/27/2019 11:04   Portable chest 1 View  Result Date: 04/25/2019 CLINICAL DATA:  Hyperactive alcohol withdrawal delirium EXAM: PORTABLE CHEST 1 VIEW COMPARISON:  Radiograph 06/01/2012 FINDINGS: Streaky opacities with low volumes favoring atelectasis. No consolidative opacity. No pneumothorax or visible effusion. The cardiomediastinal contours are unremarkable. Surgical clips present along the left hilum are similar to prior. No acute osseous or soft tissue abnormality. IMPRESSION: Streaky opacities with low volumes favoring atelectasis. No other acute cardiopulmonary abnormality. Electronically Signed   By:  Kreg Shropshire M.D.   On: 04/25/2019 04:54      Discharge Exam: Vitals:   05/01/19 0509 05/01/19 0751  BP: (!) 148/81   Pulse: 78   Resp: 16   Temp: 98.4 F (36.9 C)   SpO2: 98% 97%   Vitals:   04/30/19 2025 04/30/19 2300 05/01/19 0509 05/01/19 0751  BP:  (!) 141/69 (!) 148/81   Pulse:  70 78   Resp:  16 16   Temp:  97.9 F (36.6 C) 98.4 F (36.9 C)   TempSrc:  Oral Oral   SpO2: 98% 99% 98% 97%  Weight:      Height:        General: Pt is alert, awake, not in acute distress Cardiovascular: RRR, S1/S2 +, no rubs, no gallops Respiratory: CTA bilaterally, no wheezing, no rhonchi Abdominal: Soft, NT, ND, bowel sounds + Extremities: no edema, no cyanosis    The results of significant diagnostics from this hospitalization (including imaging, microbiology, ancillary and laboratory) are listed below for reference.     Microbiology: Recent Results (from the past 240 hour(s))  SARS CORONAVIRUS 2 (TAT 6-24 HRS) Nasopharyngeal Nasopharyngeal Swab     Status: None   Collection Time: 04/24/19  5:09 AM  Specimen: Nasopharyngeal Swab  Result Value Ref Range Status   SARS Coronavirus 2 NEGATIVE NEGATIVE Final    Comment: (NOTE) SARS-CoV-2 target nucleic acids are NOT DETECTED. The SARS-CoV-2 RNA is generally detectable in upper and lower respiratory specimens during the acute phase of infection. Negative results do not preclude SARS-CoV-2 infection, do not rule out co-infections with other pathogens, and should not be used as the sole basis for treatment or other patient management decisions. Negative results must be combined with clinical observations, patient history, and epidemiological information. The expected result is Negative. Fact Sheet for Patients: HairSlick.no Fact Sheet for Healthcare Providers: quierodirigir.com This test is not yet approved or cleared by the [place] FDA and  has been authorized for  detection and/or diagnosis of SARS-CoV-2 by FDA under an Emergency Use Authorization (EUA). This EUA will remain  in effect (meaning this test can be used) for the duration of the COVID-19 declaration under Section 56 4(b)(1) of the Act, 21 U.S.C. section 360bbb-3(b)(1), unless the authorization is terminated or revoked sooner. Performed at [facility], [address], [place], [place] [address]      Labs: BNP (last 3 results) No results for input(s): BNP in the last 8760 hours. Basic Metabolic Panel: Recent Labs  Lab 04/25/19 0526 04/26/19 0522 04/28/19 0639  NA 141 140 137  K 4.2 3.7 3.5  CL 106 106 100  CO2 26 27 24   GLUCOSE 141* 113* 94  BUN 19 17 17   CREATININE 0.70 0.72 0.68  CALCIUM 9.3 9.1 9.4  MG 2.1 2.1  --   PHOS 2.0*  --   --    Liver Function Tests: Recent Labs  Lab 04/25/19 0526  AST 20  ALT 13  ALKPHOS 64  BILITOT 1.2  PROT 6.9  ALBUMIN 4.0   No results for input(s): LIPASE, AMYLASE in the last 168 hours. Recent Labs  Lab 04/27/19 1118  AMMONIA 13   CBC: Recent Labs  Lab 04/25/19 0526 04/26/19 0522 04/28/19 0639 04/30/19 0621  WBC 11.7* 8.9 8.8 9.6  HGB 16.6 14.8 16.4 14.3  HCT 49.4 46.1 48.4 42.3  MCV 93.2 98.1 91.8 91.6  PLT 88* 92* 90* 128*   Cardiac Enzymes: No results for input(s): CKTOTAL, CKMB, CKMBINDEX, TROPONINI in the last 168 hours. BNP: Invalid input(s): POCBNP CBG: Recent Labs  Lab 04/29/19 2132 04/30/19 0817 04/30/19 1137 04/30/19 1627 05/01/19 0745  GLUCAP 127* 116* 126* 119* 120*   D-Dimer No results for input(s): DDIMER in the last 72 hours. Hgb A1c No results for input(s): HGBA1C in the last 72 hours. Lipid Profile No results for input(s): CHOL, HDL, LDLCALC, TRIG, CHOLHDL, LDLDIRECT in the last 72 hours. Thyroid function studies No results for input(s): TSH, T4TOTAL, T3FREE, THYROIDAB in the last 72 hours.  Invalid input(s): FREET3 Anemia work up No results for input(s): VITAMINB12, FOLATE,  FERRITIN, TIBC, IRON, RETICCTPCT in the last 72 hours. Urinalysis    Component Value Date/Time   COLORURINE YELLOW 04/24/2019 0955   APPEARANCEUR CLEAR 04/24/2019 0955   LABSPEC 1.021 04/24/2019 0955   PHURINE 5.0 04/24/2019 0955   GLUCOSEU NEGATIVE 04/24/2019 0955   HGBUR NEGATIVE 04/24/2019 0955   BILIRUBINUR NEGATIVE 04/24/2019 0955   KETONESUR NEGATIVE 04/24/2019 0955   PROTEINUR 30 (A) 04/24/2019 0955   NITRITE NEGATIVE 04/24/2019 0955   LEUKOCYTESUR TRACE (A) 04/24/2019 0955   Sepsis Labs Invalid input(s): PROCALCITONIN,  WBC,  LACTICIDVEN Microbiology Recent Results (from the past 240 hour(s))  SARS CORONAVIRUS 2 (TAT  6-24 HRS) Nasopharyngeal Nasopharyngeal Swab     Status: None   Collection Time: 04/24/19  5:09 AM   Specimen: Nasopharyngeal Swab  Result Value Ref Range Status   SARS Coronavirus 2 NEGATIVE NEGATIVE Final    Comment: (NOTE) SARS-CoV-2 target nucleic acids are NOT DETECTED. The SARS-CoV-2 RNA is generally detectable in upper and lower respiratory specimens during the acute phase of infection. Negative results do not preclude SARS-CoV-2 infection, do not rule out co-infections with other pathogens, and should not be used as the sole basis for treatment or other patient management decisions. Negative results must be combined with clinical observations, patient history, and epidemiological information. The expected result is Negative. Fact Sheet for Patients: HairSlick.no Fact Sheet for Healthcare Providers: quierodirigir.com This test is not yet approved or cleared by the [place] FDA and  has been authorized for detection and/or diagnosis of SARS-CoV-2 by FDA under an Emergency Use Authorization (EUA). This EUA will remain  in effect (meaning this test can be used) for the duration of the COVID-19 declaration under Section 56 4(b)(1) of the Act, 21 U.S.C. section 360bbb-3(b)(1), unless the  authorization is terminated or revoked sooner. Performed at [facility], [address], [place], [place] [address]      Time coordinating discharge: 35 minutes  SIGNED:   Erick Blinks, DO [facility] 05/01/2019, 9:16 AM  If 7PM-7AM, please contact night-coverage [email or web address]

## 2019-05-01 NOTE — Care Management Important Message (Signed)
Important Message  Patient Details  Name: Jon Bennett MRN: [id number] Date of Birth: 1934-09-24   Medicare Important Message Given:  Yes     Corey Harold 05/01/2019, 12:55 PM

## 2019-05-01 NOTE — Progress Notes (Signed)
Has had no signs of alcohol withdrawal.  Alert and oriented x 3 and eating and drinking well.  IV;s removed and report called to [place] at [place].  EMS to transport

## 2019-05-17 ENCOUNTER — Ambulatory Visit: Payer: Medicare Other | Attending: Internal Medicine

## 2019-05-17 DIAGNOSIS — Z23 Encounter for immunization: Secondary | ICD-10-CM

## 2019-05-17 NOTE — Progress Notes (Signed)
   Covid-19 Vaccination Clinic  Name:  Jon Bennett    MRN: [id number] DOB: 02-Feb-1935  05/17/2019  Jon Bennett was observed post Covid-19 immunization for 15 minutes without incident. He was provided with Vaccine Information Sheet and instruction to access the V-Safe system.   Jon Bennett was instructed to call 911 with any severe reactions post vaccine: Marland Kitchen Difficulty breathing  . Swelling of face and throat  . A fast heartbeat  . A bad rash all over body  . Dizziness and weakness

## 2019-06-20 ENCOUNTER — Ambulatory Visit: Payer: Medicare Other | Attending: Internal Medicine

## 2019-06-20 DIAGNOSIS — Z23 Encounter for immunization: Secondary | ICD-10-CM

## 2019-06-20 NOTE — Progress Notes (Signed)
   Covid-19 Vaccination Clinic  Name:  Tristan Proto    MRN: [id number] DOB: October 16, 1934  06/20/2019  Mr. Saindon was observed post Covid-19 immunization for 15 minutes without incident. He was provided with Vaccine Information Sheet and instruction to access the V-Safe system.   Mr. Burroughs was instructed to call 911 with any severe reactions post vaccine: Marland Kitchen Difficulty breathing  . Swelling of face and throat  . A fast heartbeat  . A bad rash all over body  . Dizziness and weakness   Immunizations Administered    Name Date Dose VIS Date Route   Moderna COVID-19 Vaccine 06/20/2019 11:41 AM 0.5 mL 02/13/2019 Intramuscular   Manufacturer: Gala Murdoch   Lot: [id number]   NDC: 47654-650-35

## 2019-06-28 ENCOUNTER — Other Ambulatory Visit: Payer: Self-pay

## 2019-06-28 ENCOUNTER — Inpatient Hospital Stay (HOSPITAL_COMMUNITY)
Admission: EM | Admit: 2019-06-28 | Discharge: 2019-06-30 | DRG: 057 | Disposition: A | Discharge status: Home / Self Care | Payer: Medicare Other | Attending: Internal Medicine | Admitting: Internal Medicine

## 2019-06-28 ENCOUNTER — Encounter (HOSPITAL_COMMUNITY): Payer: Self-pay

## 2019-06-28 ENCOUNTER — Emergency Department (HOSPITAL_COMMUNITY): Payer: Medicare Other

## 2019-06-28 DIAGNOSIS — E538 Deficiency of other specified B group vitamins: Secondary | ICD-10-CM | POA: Diagnosis present

## 2019-06-28 DIAGNOSIS — Z8249 Family history of ischemic heart disease and other diseases of the circulatory system: Secondary | ICD-10-CM

## 2019-06-28 DIAGNOSIS — Z79899 Other long term (current) drug therapy: Secondary | ICD-10-CM

## 2019-06-28 DIAGNOSIS — K219 Gastro-esophageal reflux disease without esophagitis: Secondary | ICD-10-CM | POA: Diagnosis present

## 2019-06-28 DIAGNOSIS — D649 Anemia, unspecified: Secondary | ICD-10-CM

## 2019-06-28 DIAGNOSIS — G2571 Drug induced akathisia: Secondary | ICD-10-CM | POA: Diagnosis present

## 2019-06-28 DIAGNOSIS — E860 Dehydration: Secondary | ICD-10-CM | POA: Diagnosis present

## 2019-06-28 DIAGNOSIS — Z886 Allergy status to analgesic agent status: Secondary | ICD-10-CM

## 2019-06-28 DIAGNOSIS — Z8719 Personal history of other diseases of the digestive system: Secondary | ICD-10-CM

## 2019-06-28 DIAGNOSIS — G249 Dystonia, unspecified: Secondary | ICD-10-CM

## 2019-06-28 DIAGNOSIS — F329 Major depressive disorder, single episode, unspecified: Secondary | ICD-10-CM | POA: Diagnosis present

## 2019-06-28 DIAGNOSIS — G2589 Other specified extrapyramidal and movement disorders: Secondary | ICD-10-CM | POA: Diagnosis not present

## 2019-06-28 DIAGNOSIS — M542 Cervicalgia: Secondary | ICD-10-CM | POA: Diagnosis present

## 2019-06-28 DIAGNOSIS — G8929 Other chronic pain: Secondary | ICD-10-CM | POA: Diagnosis present

## 2019-06-28 DIAGNOSIS — A084 Viral intestinal infection, unspecified: Secondary | ICD-10-CM | POA: Diagnosis present

## 2019-06-28 DIAGNOSIS — F419 Anxiety disorder, unspecified: Secondary | ICD-10-CM | POA: Diagnosis present

## 2019-06-28 DIAGNOSIS — R112 Nausea with vomiting, unspecified: Secondary | ICD-10-CM | POA: Diagnosis present

## 2019-06-28 DIAGNOSIS — E119 Type 2 diabetes mellitus without complications: Secondary | ICD-10-CM

## 2019-06-28 DIAGNOSIS — Z20822 Contact with and (suspected) exposure to covid-19: Secondary | ICD-10-CM | POA: Diagnosis present

## 2019-06-28 DIAGNOSIS — I10 Essential (primary) hypertension: Secondary | ICD-10-CM | POA: Diagnosis present

## 2019-06-28 DIAGNOSIS — F1010 Alcohol abuse, uncomplicated: Secondary | ICD-10-CM | POA: Diagnosis present

## 2019-06-28 DIAGNOSIS — D696 Thrombocytopenia, unspecified: Secondary | ICD-10-CM | POA: Diagnosis present

## 2019-06-28 DIAGNOSIS — Z7902 Long term (current) use of antithrombotics/antiplatelets: Secondary | ICD-10-CM

## 2019-06-28 LAB — RAPID URINE DRUG SCREEN, HOSP PERFORMED
Amphetamines: NOT DETECTED
Barbiturates: NOT DETECTED
Benzodiazepines: NOT DETECTED
Cocaine: NOT DETECTED
Opiates: NOT DETECTED
Tetrahydrocannabinol: NOT DETECTED

## 2019-06-28 LAB — CBC WITH DIFFERENTIAL/PLATELET
Abs Immature Granulocytes: 0.03 10*3/uL (ref 0.00–0.07)
Basophils Absolute: 0 10*3/uL (ref 0.0–0.1)
Basophils Relative: 0 %
Eosinophils Absolute: 0.2 10*3/uL (ref 0.0–0.5)
Eosinophils Relative: 2 %
HCT: 40.8 % (ref 39.0–52.0)
Hemoglobin: 13.4 g/dL (ref 13.0–17.0)
Immature Granulocytes: 0 %
Lymphocytes Relative: 13 %
Lymphs Abs: 1.1 10*3/uL (ref 0.7–4.0)
MCH: 30.1 pg (ref 26.0–34.0)
MCHC: 32.8 g/dL (ref 30.0–36.0)
MCV: 91.7 fL (ref 80.0–100.0)
Monocytes Absolute: 0.7 10*3/uL (ref 0.1–1.0)
Monocytes Relative: 9 %
Neutro Abs: 6.4 10*3/uL (ref 1.7–7.7)
Neutrophils Relative %: 76 %
Platelets: 125 10*3/uL — ABNORMAL LOW (ref 150–400)
RBC: 4.45 MIL/uL (ref 4.22–5.81)
RDW: 13.8 % (ref 11.5–15.5)
WBC: 8.4 10*3/uL (ref 4.0–10.5)
nRBC: 0 % (ref 0.0–0.2)

## 2019-06-28 LAB — COMPREHENSIVE METABOLIC PANEL
ALT: 11 U/L (ref 0–44)
AST: 19 U/L (ref 15–41)
Albumin: 4.4 g/dL (ref 3.5–5.0)
Alkaline Phosphatase: 75 U/L (ref 38–126)
Anion gap: 10 (ref 5–15)
BUN: 13 mg/dL (ref 8–23)
CO2: 25 mmol/L (ref 22–32)
Calcium: 9.6 mg/dL (ref 8.9–10.3)
Chloride: 102 mmol/L (ref 98–111)
Creatinine, Ser: 1.15 mg/dL (ref 0.61–1.24)
GFR calc Af Amer: >60 mL/min (ref >60)
GFR calc non Af Amer: 59 mL/min — ABNORMAL LOW (ref >60)
Glucose, Bld: 124 mg/dL — ABNORMAL HIGH (ref 70–99)
Potassium: 4.2 mmol/L (ref 3.5–5.1)
Sodium: 137 mmol/L (ref 135–145)
Total Bilirubin: 0.9 mg/dL (ref 0.3–1.2)
Total Protein: 7.3 g/dL (ref 6.5–8.1)

## 2019-06-28 LAB — ETHANOL: Alcohol, Ethyl (B): <10 mg/dL (ref <10)

## 2019-06-28 LAB — LIPASE, BLOOD: Lipase: 15 U/L (ref 11–51)

## 2019-06-28 MED ORDER — ONDANSETRON HCL 4 MG/2ML IJ SOLN
4.0000 mg | Freq: Once | INTRAMUSCULAR | Status: AC
  Administered 2019-06-28: 4 mg via INTRAVENOUS
  Filled 2019-06-28: qty 2

## 2019-06-28 MED ORDER — SODIUM CHLORIDE 0.9 % IV BOLUS
1000.0000 mL | Freq: Once | INTRAVENOUS | Status: AC
  Administered 2019-06-28: 1000 mL via INTRAVENOUS

## 2019-06-28 NOTE — ED Provider Notes (Signed)
Patient was left at change of shift to get results of his head CT and then relay those to Dr. Robb Matar, hospitalist.  Dr. Bernette Mayers had already discussed the reason for admission with him.  12:03 AM Dr. Robb Matar was in the ED and informed the CT was back.  He is going to admit the patient for observation.   CT Head Wo Contrast  Result Date: 06/28/2019 CLINICAL DATA:  Ataxia, stroke suspected, episodic emesis and nausea EXAM: CT HEAD WITHOUT CONTRAST TECHNIQUE: Contiguous axial images were obtained from the base of the skull through the vertex without intravenous contrast. 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 No evidence of acute infarction, hemorrhage, hydrocephalus, extra-axial collection or mass lesion/mass effect. Symmetric prominence of the ventricles, cisterns and sulci compatible with parenchymal volume loss. Patchy and confluent areas of white matter hypoattenuation are most compatible with chronic microvascular angiopathy. Benign dural calcifications are similar to comparison is. Vascular: Atherosclerotic calcification of the carotid siphons. No hyperdense vessel. Skull: No calvarial fracture or suspicious osseous lesion. No scalp swelling or hematoma. Sinuses/Orbits: Paranasal sinuses are predominantly clear. There is mild rightward nasal septal deviation. Trace left mastoid effusion. Right mastoid air cells are predominantly clear. Middle ear cavities are clear. Included orbital structures are unremarkable. Other: None IMPRESSION: 1. No acute intracranial abnormality. 2. Stable parenchymal volume loss and chronic microvascular angiopathy. 3. 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 4. Trace left mastoid effusion. Electronically Signed   By: Kreg Shropshire M.D.   On: 06/28/2019 23:54      Devoria Albe, MD 06/29/19 0005

## 2019-06-28 NOTE — ED Provider Notes (Signed)
[facility] EMERGENCY DEPARTMENT Provider Note  CSN: [id number] Arrival date & time: 06/28/19 2034    History Chief Complaint  Patient presents with  . Emesis    HPI  Jon Bennett is a 85 y.o. male with history of EtOH abuse, pancreatitis and chronic neck pain reports he has not been feeling well since yesterday. He is vague with his symptoms but reports nausea, vomiting earlier today with nervousness/jitteriness this afternoon with general ill feeling. He reports he was given a course of steroids for his neck pain about 10 days ago and has finished those. He had an admission for similar symptoms in Feb 2021 identified as alcohol withdrawal but he reports no alcohol use since that admission. He was given Librium then but reports he is no longer taking it. He reports he takes Baclofen occasionally as a muscle relaxer, hadn't been taking it for a while, but started back about 2 days ago. HE denies any abdominal pain, no fever. He lives with his son and he reports he was doing well, working on his car just a few days ago. He reports that his restlessness is new.    Past Medical History:  Diagnosis Date  . Diabetes mellitus without complication ([place])   . GERD (gastroesophageal reflux disease)   . Hypertension   . Pancreatitis, alcoholic, acute     Past Surgical History:  Procedure Laterality Date  . VASCULAR SURGERY      Family History  Problem Relation Age of Onset  . Hypertension Other     Social History   Tobacco Use  . Smoking status: Never Smoker  . Smokeless tobacco: Never Used  Substance Use Topics  . Alcohol use: Yes    Alcohol/week: 4.0 - 5.0 standard drinks    Types: 4 - 5 Shots of liquor per week    Comment: "half a pint of whiskey daily"  . Drug use: Never     Home Medications Prior to Admission medications   Medication Sig Start Date End Date Taking? Authorizing Provider  acetaminophen (TYLENOL) 500 MG tablet Take 500 mg by mouth daily as needed for  mild pain or moderate pain.    [provider]  baclofen (LIORESAL) 10 MG tablet Take 1 tablet (10 mg total) by mouth daily as needed for muscle spasms. 05/01/19   Manuella Ghazi, Pratik D, DO  chlordiazePOXIDE (LIBRIUM) 25 MG capsule Take 1 capsule (25 mg total) by mouth every 6 (six) hours as needed for withdrawal (CIWA score > 10). 05/01/19   Manuella Ghazi, Pratik D, DO  clopidogrel (PLAVIX) 75 MG tablet Take 75 mg by mouth daily.  02/08/12   [provider]  famotidine (PEPCID) 20 MG tablet Take 20 mg by mouth 2 (two) times daily.  08/07/18   [provider]  metoprolol tartrate (LOPRESSOR) 25 MG tablet Take 1 tablet (25 mg total) by mouth 2 (two) times daily. 08/18/18   Orson Eva, MD  Vitamin D, Ergocalciferol, (DRISDOL) 1.25 MG (50000 UNIT) CAPS capsule Take 50,000 Units by mouth every 7 (seven) days.    [provider]     Allergies    Asa [aspirin]   Review of Systems   Review of Systems  Constitutional: Negative for fever.  HENT: Negative for congestion and sore throat.   Respiratory: Negative for cough and shortness of breath.   Cardiovascular: Negative for chest pain.  Gastrointestinal: Positive for nausea and vomiting. Negative for abdominal pain and diarrhea.  Genitourinary: Negative for dysuria.  Musculoskeletal: Negative for  myalgias.  Skin: Negative for rash.  Neurological: Negative for headaches.       Restlessness  Psychiatric/Behavioral: Negative for behavioral problems.     Physical Exam BP (!) 170/90 (BP Location: Right Arm)   Pulse 73   Temp 98.4 F (36.9 C) (Oral)   Resp 20   Ht 5\' 9"  (1.753 m)   Wt 65.8 kg   SpO2 94%   BMI 21.41 kg/m   Physical Exam Vitals and nursing note reviewed.  Constitutional:      Appearance: Normal appearance.  HENT:     Head: Normocephalic and atraumatic.     Nose: Nose normal.     Mouth/Throat:     Mouth: Mucous membranes are moist.  Eyes:     Extraocular Movements: Extraocular movements intact.      Conjunctiva/sclera: Conjunctivae normal.  Cardiovascular:     Rate and Rhythm: Normal rate.  Pulmonary:     Effort: Pulmonary effort is normal.     Breath sounds: Normal breath sounds.  Abdominal:     General: Abdomen is flat.     Palpations: Abdomen is soft.     Tenderness: There is no abdominal tenderness.  Musculoskeletal:        General: No swelling. Normal range of motion.     Cervical back: Neck supple.  Skin:    General: Skin is warm and dry.  Neurological:     General: No focal deficit present.     Mental Status: He is alert and oriented to person, place, and time.     Cranial Nerves: No cranial nerve deficit.     Sensory: No sensory deficit.     Motor: No weakness.     Coordination: Coordination normal.     Comments: Restlessness, involuntary movements of arms and legs appears to be akathisia vs EPS, not present with voluntary movements and normal gait, no pronator drift, no asterixis  Psychiatric:        Mood and Affect: Mood normal.      ED Results / Procedures / Treatments   Labs (all labs ordered are listed, but only abnormal results are displayed) Labs Reviewed  COMPREHENSIVE METABOLIC PANEL - Abnormal; Notable for the following components:      Result Value   Glucose, Bld 124 (*)    GFR calc non Af Amer 59 (*)    All other components within normal limits  CBC WITH DIFFERENTIAL/PLATELET - Abnormal; Notable for the following components:   Platelets 125 (*)    All other components within normal limits  ETHANOL  LIPASE, BLOOD  RAPID URINE DRUG SCREEN, HOSP PERFORMED  VITAMIN B12    EKG EKG Interpretation  Date/Time:  Thursday June 28 2019 21:42:01 EDT Ventricular Rate:  70 PR Interval:    QRS Duration: 98 QT Interval:  398 QTC Calculation: 430 R Axis:   -38 Text Interpretation: Sinus rhythm Left axis deviation Anterior infarct, old Minimal ST depression, lateral leads Since last tracing Left axis deviation NOW PRESENT Confirmed by [id number]  [phone]) on 06/28/2019 10:40:13 PM   Radiology CT Head Wo Contrast  Result Date: 06/28/2019 CLINICAL DATA:  Ataxia, stroke suspected, episodic emesis and nausea EXAM: CT HEAD WITHOUT CONTRAST TECHNIQUE: Contiguous axial images were obtained from the base of the skull through the vertex without intravenous contrast. 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 No evidence of acute infarction, hemorrhage, hydrocephalus, extra-axial collection or mass lesion/mass effect. Symmetric prominence of the  ventricles, cisterns and sulci compatible with parenchymal volume loss. Patchy and confluent areas of white matter hypoattenuation are most compatible with chronic microvascular angiopathy. Benign dural calcifications are similar to comparison is. Vascular: Atherosclerotic calcification of the carotid siphons. No hyperdense vessel. Skull: No calvarial fracture or suspicious osseous lesion. No scalp swelling or hematoma. Sinuses/Orbits: Paranasal sinuses are predominantly clear. There is mild rightward nasal septal deviation. Trace left mastoid effusion. Right mastoid air cells are predominantly clear. Middle ear cavities are clear. Included orbital structures are unremarkable. Other: None IMPRESSION: 1. No acute intracranial abnormality. 2. Stable parenchymal volume loss and chronic microvascular angiopathy. 3. 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 4. Trace left mastoid effusion. Electronically Signed   By: Kreg Shropshire M.D.   On: 06/28/2019 23:54    Procedures Procedures  Medications Ordered in the ED Medications  acetaminophen (TYLENOL) tablet 650 mg (has no administration in time range)    Or  acetaminophen (TYLENOL) suppository 650 mg (has no administration in time range)  ondansetron (ZOFRAN) tablet 4 mg (has no administration in time range)    Or  ondansetron (ZOFRAN) injection 4 mg (has no administration in time  range)  LORazepam (ATIVAN) injection 0.5 mg (has no administration in time range)  sodium chloride 0.9 % bolus 1,000 mL (0 mLs Intravenous Stopped 06/29/19 0141)  ondansetron (ZOFRAN) injection 4 mg (4 mg Intravenous Given 06/28/19 2313)  metoprolol tartrate (LOPRESSOR) injection 5 mg (5 mg Intravenous Given 06/29/19 0055)  LORazepam (ATIVAN) injection 0.75 mg (0.75 mg Intravenous Given 06/29/19 0054)  famotidine (PEPCID) IVPB 20 mg premix (0 mg Intravenous Stopped 06/29/19 0845)     ED Course  I have reviewed the triage vital signs and the nursing notes.  Pertinent labs & imaging results that were available during my care of the patient were reviewed by me and considered in my medical decision making (see chart for details).  Clinical Course as of Jun 28 1028  Thu Jun 28, 2019  2315 Labs reviewed and unremarkable. No clear reason for his nausea or unusual movements. I spoke with his son who corroborates the history and agrees that he has not seen the patient drinking any EtOH in several months. He does report the patient has had some headache so I will add CT head. Discussed with Dr. Robb Matar, Hospitalist who will plan to admit when CT is back. Dr. Lars Mage aware of the plan.     [CS]    Clinical Course User Index [CS] Pollyann Savoy, MD    MDM Rules/Calculators/A&P MDM Number of Diagnoses or Management Options Diagnosis management comments: Patient with nausea, vomiting, general ill feeling and restlessness, similar to previous admission for EtOH withdrawal, but patient denies drinking since Feb. Will check labs, give IVF and reassess.     Amount and/or Complexity of Data Reviewed Clinical lab tests: ordered and reviewed Review and summarize past medical records: yes Independent visualization of images, tracings, or specimens: yes  Risk of Complications, Morbidity, and/or Mortality Presenting problems: high Diagnostic procedures: high Management options: high    Final Clinical  Impression(s) / ED Diagnoses Final diagnoses:  Dyskinesia    Rx / DC Orders ED Discharge Orders    None       Pollyann Savoy, MD 06/29/19 1030

## 2019-06-28 NOTE — ED Notes (Signed)
Pt unsure if he took any of his medications today. He reports not taking them because he was unsure if he took them earlier in the day.

## 2019-06-28 NOTE — ED Triage Notes (Signed)
Pt arrives from home via POV c/o 2 episodes of emesis and nausea. Denies Diarrhea. No abdominal pain. Pt requesting evaluation for nausea and presents to be very anxious and fidgety in Triage.

## 2019-06-29 ENCOUNTER — Encounter (HOSPITAL_COMMUNITY): Payer: Self-pay | Admitting: Internal Medicine

## 2019-06-29 DIAGNOSIS — Z7902 Long term (current) use of antithrombotics/antiplatelets: Secondary | ICD-10-CM | POA: Diagnosis not present

## 2019-06-29 DIAGNOSIS — Z8719 Personal history of other diseases of the digestive system: Secondary | ICD-10-CM | POA: Diagnosis not present

## 2019-06-29 DIAGNOSIS — I10 Essential (primary) hypertension: Secondary | ICD-10-CM | POA: Diagnosis present

## 2019-06-29 DIAGNOSIS — D696 Thrombocytopenia, unspecified: Secondary | ICD-10-CM | POA: Diagnosis present

## 2019-06-29 DIAGNOSIS — E538 Deficiency of other specified B group vitamins: Secondary | ICD-10-CM | POA: Diagnosis present

## 2019-06-29 DIAGNOSIS — F329 Major depressive disorder, single episode, unspecified: Secondary | ICD-10-CM | POA: Diagnosis present

## 2019-06-29 DIAGNOSIS — K219 Gastro-esophageal reflux disease without esophagitis: Secondary | ICD-10-CM | POA: Diagnosis present

## 2019-06-29 DIAGNOSIS — Z79899 Other long term (current) drug therapy: Secondary | ICD-10-CM | POA: Diagnosis not present

## 2019-06-29 DIAGNOSIS — E860 Dehydration: Secondary | ICD-10-CM | POA: Diagnosis present

## 2019-06-29 DIAGNOSIS — G8929 Other chronic pain: Secondary | ICD-10-CM | POA: Diagnosis present

## 2019-06-29 DIAGNOSIS — Z8249 Family history of ischemic heart disease and other diseases of the circulatory system: Secondary | ICD-10-CM | POA: Diagnosis not present

## 2019-06-29 DIAGNOSIS — A084 Viral intestinal infection, unspecified: Secondary | ICD-10-CM | POA: Diagnosis present

## 2019-06-29 DIAGNOSIS — E119 Type 2 diabetes mellitus without complications: Secondary | ICD-10-CM | POA: Diagnosis present

## 2019-06-29 DIAGNOSIS — F1010 Alcohol abuse, uncomplicated: Secondary | ICD-10-CM | POA: Diagnosis present

## 2019-06-29 DIAGNOSIS — G249 Dystonia, unspecified: Secondary | ICD-10-CM

## 2019-06-29 DIAGNOSIS — F419 Anxiety disorder, unspecified: Secondary | ICD-10-CM | POA: Diagnosis present

## 2019-06-29 DIAGNOSIS — G2571 Drug induced akathisia: Secondary | ICD-10-CM | POA: Diagnosis not present

## 2019-06-29 DIAGNOSIS — M542 Cervicalgia: Secondary | ICD-10-CM | POA: Diagnosis present

## 2019-06-29 DIAGNOSIS — Z886 Allergy status to analgesic agent status: Secondary | ICD-10-CM | POA: Diagnosis not present

## 2019-06-29 DIAGNOSIS — G2589 Other specified extrapyramidal and movement disorders: Secondary | ICD-10-CM | POA: Diagnosis present

## 2019-06-29 DIAGNOSIS — R112 Nausea with vomiting, unspecified: Secondary | ICD-10-CM | POA: Diagnosis present

## 2019-06-29 DIAGNOSIS — Z20822 Contact with and (suspected) exposure to covid-19: Secondary | ICD-10-CM | POA: Diagnosis present

## 2019-06-29 DIAGNOSIS — D649 Anemia, unspecified: Secondary | ICD-10-CM | POA: Diagnosis present

## 2019-06-29 LAB — VITAMIN B12: Vitamin B-12: 385 pg/mL (ref 180–914)

## 2019-06-29 LAB — GLUCOSE, CAPILLARY
Glucose-Capillary: 99 mg/dL (ref 70–99)
Glucose-Capillary: 107 mg/dL — ABNORMAL HIGH (ref 70–99)

## 2019-06-29 MED ORDER — ACETAMINOPHEN 325 MG PO TABS
650.0000 mg | ORAL_TABLET | Freq: Four times a day (QID) | ORAL | Status: DC | PRN
  Administered 2019-06-29: 650 mg via ORAL
  Filled 2019-06-29: qty 2

## 2019-06-29 MED ORDER — LORAZEPAM 2 MG/ML IJ SOLN
0.7500 mg | Freq: Once | INTRAMUSCULAR | Status: AC
  Administered 2019-06-29: 0.75 mg via INTRAVENOUS
  Filled 2019-06-29: qty 1

## 2019-06-29 MED ORDER — METOPROLOL TARTRATE 5 MG/5ML IV SOLN
5.0000 mg | Freq: Once | INTRAVENOUS | Status: AC
  Administered 2019-06-29: 5 mg via INTRAVENOUS
  Filled 2019-06-29: qty 5

## 2019-06-29 MED ORDER — SODIUM CHLORIDE 0.9 % IV SOLN: INTRAVENOUS | Status: DC

## 2019-06-29 MED ORDER — FAMOTIDINE 20 MG PO TABS
20.0000 mg | ORAL_TABLET | Freq: Every day | ORAL | Status: DC
  Administered 2019-06-29: 20 mg via ORAL
  Filled 2019-06-29: qty 1

## 2019-06-29 MED ORDER — PANTOPRAZOLE SODIUM 40 MG PO TBEC
40.0000 mg | DELAYED_RELEASE_TABLET | Freq: Every day | ORAL | Status: DC
  Administered 2019-06-29 – 2019-06-30 (×2): 40 mg via ORAL
  Filled 2019-06-29 (×2): qty 1

## 2019-06-29 MED ORDER — ACETAMINOPHEN 650 MG RE SUPP: 650.0000 mg | Freq: Four times a day (QID) | RECTAL | Status: DC | PRN

## 2019-06-29 MED ORDER — FOLIC ACID 1 MG PO TABS
1.0000 mg | ORAL_TABLET | Freq: Every day | ORAL | Status: DC
  Administered 2019-06-29 – 2019-06-30 (×2): 1 mg via ORAL
  Filled 2019-06-29 (×2): qty 1

## 2019-06-29 MED ORDER — LORAZEPAM 2 MG/ML IJ SOLN: 0.5000 mg | INTRAMUSCULAR | Status: DC | PRN

## 2019-06-29 MED ORDER — FAMOTIDINE IN NACL 20-0.9 MG/50ML-% IV SOLN
20.0000 mg | Freq: Once | INTRAVENOUS | Status: AC
  Administered 2019-06-29: 20 mg via INTRAVENOUS
  Filled 2019-06-29: qty 50

## 2019-06-29 MED ORDER — AMLODIPINE BESYLATE 5 MG PO TABS
5.0000 mg | ORAL_TABLET | Freq: Every day | ORAL | Status: DC
  Administered 2019-06-29 – 2019-06-30 (×2): 5 mg via ORAL
  Filled 2019-06-29 (×2): qty 1

## 2019-06-29 MED ORDER — ONDANSETRON HCL 4 MG/2ML IJ SOLN
4.0000 mg | Freq: Four times a day (QID) | INTRAMUSCULAR | Status: DC | PRN
  Administered 2019-06-29 (×2): 4 mg via INTRAVENOUS
  Filled 2019-06-29 (×2): qty 2

## 2019-06-29 MED ORDER — THIAMINE HCL 100 MG PO TABS
100.0000 mg | ORAL_TABLET | Freq: Every day | ORAL | Status: DC
  Administered 2019-06-29 – 2019-06-30 (×2): 100 mg via ORAL
  Filled 2019-06-29 (×2): qty 1

## 2019-06-29 MED ORDER — HYDRALAZINE HCL 20 MG/ML IJ SOLN
10.0000 mg | Freq: Three times a day (TID) | INTRAMUSCULAR | Status: DC | PRN
  Filled 2019-06-29: qty 1

## 2019-06-29 MED ORDER — ONDANSETRON HCL 4 MG PO TABS: 4.0000 mg | ORAL_TABLET | Freq: Four times a day (QID) | ORAL | Status: DC | PRN

## 2019-06-29 MED ORDER — LORAZEPAM 2 MG/ML IJ SOLN: 1.0000 mg | Freq: Once | INTRAMUSCULAR | Status: DC

## 2019-06-29 NOTE — H&P (Signed)
History and Physical    Dekari Bures MVE:[id number] DOB: 1934-04-25 DOA: 06/28/2019  PCP: [name], MD   Patient coming from: Home.  I have personally briefly reviewed patient's old medical records in [facility]  Chief Complaint: Nausea and vomiting.  HPI: Jon Bennett is a 85 y.o. male with medical history significant of type 2 diabetes, GERD, hypertension, history of alcoholic pancreatitis, history of EtOH abuse in remission since being admitted in February who is coming to the emergency department with complaints of having nausea and 2 episodes of emesis earlier.  He denies abdominal pain, He mentions that he has not been feeling well since yesterday.  He was reported to be anxious and fidgeting in triage and has been having purposeless movements in the ED room since.  He denies abdominal pain, diarrhea, constipation, melena or hematochezia.  No headache, fever, chills, rhinorrhea, sore throat, dyspnea, wheezing or hemoptysis.  He denies chest pain, palpitations, dizziness, diaphoresis, PND, orthopnea or pitting edema of the lower extremities.  He has not had dysuria, frequency or hematuria.  No polyuria, polydipsia, polyphagia or blurred vision.  ED Course: Initial vital signs were temperature 98.4 F, pulse 73, respiration 20, blood pressure 170/90 mmHg and O2 sat 94% on room air.  The patient was given a 1000 mL NS bolus and 4 mg of ondansetron IVP x1.    CBC shows white count of 8.4 with 76% neutrophils, 30% lymphocytes and 9% monocytes.  Hemoglobin 13.4 g/dL and platelets of 283.  UDS was negative.  Alcohol level was normal.  Lipase was only 15 units/L.  His CMP shows a glucose of 124 mg/dL other chemistry resulted values are normal.  EKG sinus rhythm with left axis deviation with old infarct, minimal ST depression.  No significant change when compared to previous.  Imaging: CT head did not show any acute intracranial abnormality.  Stable parenchymal volume loss and chronic  microvascular angiopathy.  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  Trace left mastoid effusion.  Please see images and full cardiology report for further details.  Review of Systems: As per HPI otherwise all other systems reviewed and are negative.  Past Medical History:  Diagnosis Date  . Diabetes mellitus without complication (HCC)   . GERD (gastroesophageal reflux disease)   . Hypertension   . Pancreatitis, alcoholic, acute    Past Surgical History:  Procedure Laterality Date  . VASCULAR SURGERY     Social History  reports that he has never smoked. He has never used smokeless tobacco. He reports current alcohol use of about 4.0 - 5.0 standard drinks of alcohol per week. He reports that he does not use drugs.  Allergies  Allergen Reactions  . Asa [Aspirin] Shortness Of Breath    Family History  Problem Relation Age of Onset  . Hypertension Other    Prior to Admission medications   Medication Sig Start Date End Date Taking? Authorizing Provider  acetaminophen (TYLENOL) 500 MG tablet Take 500 mg by mouth daily as needed for mild pain or moderate pain.    [provider]  baclofen (LIORESAL) 10 MG tablet Take 1 tablet (10 mg total) by mouth daily as needed for muscle spasms. 05/01/19   Sherryll Burger, Pratik D, DO  chlordiazePOXIDE (LIBRIUM) 25 MG capsule Take 1 capsule (25 mg total) by mouth every 6 (six) hours as needed for withdrawal (CIWA score > 10). 05/01/19   Sherryll Burger, Pratik D, DO  clopidogrel (PLAVIX) 75 MG tablet Take 75 mg  by mouth daily.  02/08/12   [provider]  famotidine (PEPCID) 20 MG tablet Take 20 mg by mouth 2 (two) times daily.  08/07/18   [provider]  metoprolol tartrate (LOPRESSOR) 25 MG tablet Take 1 tablet (25 mg total) by mouth 2 (two) times daily. 08/18/18   Catarina Hartshorn, MD  Vitamin D, Ergocalciferol, (DRISDOL) 1.25 MG (50000 UNIT) CAPS capsule Take 50,000 Units by mouth every 7 (seven) days.    [provider]   Physical Exam: Vitals:   06/28/19 2053  BP: (!) 170/90  Pulse: 73  Resp: 20  Temp: 98.4 F (36.9 C)  TempSrc: Oral  SpO2: 94%  Weight: 65.8 kg  Height: 5\' 9"  (1.753 m)   Constitutional: NAD, calm, comfortable Eyes: PERRL, lids and conjunctivae normal ENMT: Mucous membranes are moist. Posterior pharynx clear of any exudate or lesions. Neck: normal, supple, no masses, no thyromegaly Respiratory: clear to auscultation bilaterally, no wheezing, no crackles. Normal respiratory effort. No accessory muscle use.  Cardiovascular: Regular rate and rhythm, no murmurs / rubs / gallops. No extremity edema. 2+ pedal pulses. No carotid bruits.  Abdomen: Nondistended.  BS positive.  Soft, no tenderness, no masses palpated. No hepatosplenomegaly. Musculoskeletal: no clubbing / cyanosis. Good ROM, no contractures. Normal muscle tone.  Skin: Some small areas of ecchymosis on extremities. Neurologic: Positive acathisia, otherwise CN 2-12 grossly intact. Sensation intact, DTR normal.  Generalized weakness.  Psychiatric: Alert and oriented x 2, partially oriented to situation, time and date.  Mildly anxious mood.   Labs on Admission: I have personally reviewed following labs and imaging studies  CBC: Recent Labs  Lab 06/28/19 2143  WBC 8.4  NEUTROABS 6.4  HGB 13.4  HCT 40.8  MCV 91.7  PLT 125*    Basic Metabolic Panel: Recent Labs  Lab 06/28/19 2143  NA 137  K 4.2  CL 102  CO2 25  GLUCOSE 124*  BUN 13  CREATININE 1.15  CALCIUM 9.6    GFR: Estimated Creatinine Clearance: 46.1 mL/min (by C-G formula based on SCr of 1.15 mg/dL).  Liver Function Tests: Recent Labs  Lab 06/28/19 2143  AST 19  ALT 11  ALKPHOS 75  BILITOT 0.9  PROT 7.3  ALBUMIN 4.4   Radiological Exams on Admission: CT Head Wo Contrast  Result Date: 06/28/2019 CLINICAL DATA:  Ataxia, stroke suspected, episodic emesis and nausea EXAM: CT HEAD WITHOUT CONTRAST TECHNIQUE: Contiguous axial  images were obtained from the base of the skull through the vertex without intravenous contrast. 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 No evidence of acute infarction, hemorrhage, hydrocephalus, extra-axial collection or mass lesion/mass effect. Symmetric prominence of the ventricles, cisterns and sulci compatible with parenchymal volume loss. Patchy and confluent areas of white matter hypoattenuation are most compatible with chronic microvascular angiopathy. Benign dural calcifications are similar to comparison is. Vascular: Atherosclerotic calcification of the carotid siphons. No hyperdense vessel. Skull: No calvarial fracture or suspicious osseous lesion. No scalp swelling or hematoma. Sinuses/Orbits: Paranasal sinuses are predominantly clear. There is mild rightward nasal septal deviation. Trace left mastoid effusion. Right mastoid air cells are predominantly clear. Middle ear cavities are clear. Included orbital structures are unremarkable. Other: None IMPRESSION: 1. No acute intracranial abnormality. 2. Stable parenchymal volume loss and chronic microvascular angiopathy. 3. 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 4. Trace left mastoid effusion. Electronically Signed   By: 06/22/2019 M.D.   On: 06/28/2019  23:54   EKG: Independently reviewed. Vent. rate 70 BPM PR interval * ms QRS duration 98 ms QT/QTc 398/430 ms P-R-T axes 21 -38 62 Sinus rhythm Left axis deviation Anterior infarct, old Minimal ST depression, lateral leads  Assessment/Plan Principal Problem:   Akathisia No history of antipsychotic medication use. No metoclopramide or prochlorperazine use. Observation/telemetry. Frequent neuro checks. Anxiolytic as needed. Consult neurology.  Active Problems:   Nausea and vomiting Received NS bolus. Antiemetics as needed.    Thrombocytopenia ([place]) EtOH related? Monitor platelet levels.     Type 2 diabetes mellitus (HCC) Carbohydrate modified diet. CBG monitoring before meals and bedtime. Check hemoglobin A1c.    Hypertension Continue metoprolol 25 mg p.o. twice daily. Monitor blood pressure and heart rate.    GERD (gastroesophageal reflux disease) Continue famotidine 20 mg p.o. twice daily.   DVT prophylaxis: SCDs. Code Status:   Full code. Family Communication:   Disposition Plan:   Patient is from:  Home.  Anticipated DC to:  Home.  Anticipated DC date:  06/29/2019 or 06/30/2019.  Anticipated DC barriers: Clinical improvement/neurology consult.  Consults called:  Routine neurology consult. Admission status:  Observation/telemetry.   Severity of Illness:   Moderate severity.   Reubin Milan MD [facility]  How to contact the [facility] Attending or Consulting provider [place] or covering provider during after hours [place], for this patient?   1. Check the care team in [facility] and look for a) attending/consulting [facility] provider listed and b) the [facility] team listed 2. Log into [email or web address] and use [place]'s universal password to access. If you do not have the password, please contact the hospital operator. 3. Locate the [facility] provider you are looking for under [facility] and page to a number that you can be directly reached. 4. If you still have difficulty reaching the provider, please page the [facility] (Director on Call) for the Hospitalists listed on amion for assistance.  06/29/2019, 1:47 AM   This document was prepared using Dragon voice recognition software and may contain some unintended transcription errors.

## 2019-06-29 NOTE — ED Notes (Signed)
Pt in bed, pt states that he needs to void, assisted pt to bathroom, changed pt into hospital gown, pt c/o nausea, pt vomited small amount, nausea med given, pt reports neck pain, repositioned pt.

## 2019-06-29 NOTE — Plan of Care (Signed)
  Problem: Education: Goal: Knowledge of General Education information will improve Description: Including pain rating scale, medication(s)/side effects and non-pharmacologic comfort measures Outcome: Progressing   Problem: Elimination: Goal: Will not experience complications related to urinary retention Outcome: Progressing   Problem: Pain Managment: Goal: General experience of comfort will improve Outcome: Progressing   

## 2019-06-29 NOTE — ED Notes (Signed)
IV noted to be out.

## 2019-06-29 NOTE — Progress Notes (Signed)
Patient seen and examined; admitted after midnight secondary to acathisia, mild dehydration and nausea/vomiting.  Hemodynamically stable currently.  In no acute distress.  Please refer to H&P written by Dr. Robb Matar on 12/29/2019 for further info/details.  Plan: -Continue gentle fluid resuscitation -Continue advancing diet as tolerated -Continue as needed antiemetics and the use of famotidine. -Will follow clinical response and recommendations by neurology.  Vassie Loll MD [phone]

## 2019-06-29 NOTE — ED Notes (Signed)
Pt in bed, resps even and unlabored, helped pt with the phone to call son, call bell within reach, pt offers no complaints at this time

## 2019-06-30 DIAGNOSIS — K219 Gastro-esophageal reflux disease without esophagitis: Secondary | ICD-10-CM

## 2019-06-30 DIAGNOSIS — D518 Other vitamin B12 deficiency anemias: Secondary | ICD-10-CM

## 2019-06-30 DIAGNOSIS — G2571 Drug induced akathisia: Secondary | ICD-10-CM | POA: Diagnosis not present

## 2019-06-30 DIAGNOSIS — D696 Thrombocytopenia, unspecified: Secondary | ICD-10-CM

## 2019-06-30 DIAGNOSIS — G249 Dystonia, unspecified: Secondary | ICD-10-CM | POA: Diagnosis not present

## 2019-06-30 DIAGNOSIS — E119 Type 2 diabetes mellitus without complications: Secondary | ICD-10-CM

## 2019-06-30 DIAGNOSIS — R112 Nausea with vomiting, unspecified: Secondary | ICD-10-CM | POA: Diagnosis not present

## 2019-06-30 DIAGNOSIS — D649 Anemia, unspecified: Secondary | ICD-10-CM

## 2019-06-30 LAB — GLUCOSE, CAPILLARY
Glucose-Capillary: 85 mg/dL (ref 70–99)
Glucose-Capillary: 106 mg/dL — ABNORMAL HIGH (ref 70–99)
Glucose-Capillary: 92 mg/dL (ref 70–99)

## 2019-06-30 LAB — HEMOGLOBIN A1C
Hgb A1c MFr Bld: 6.1 % — ABNORMAL HIGH (ref 4.8–5.6)
Mean Plasma Glucose: 128.37 mg/dL

## 2019-06-30 MED ORDER — FAMOTIDINE 20 MG PO TABS: 20.0000 mg | ORAL_TABLET | Freq: Every day | ORAL | Status: DC

## 2019-06-30 MED ORDER — MULTI-VITAMIN/MINERALS PO TABS: 1.0000 | ORAL_TABLET | Freq: Every day | ORAL | 3 refills | Status: AC

## 2019-06-30 MED ORDER — ONDANSETRON 8 MG PO TBDP: 8.0000 mg | ORAL_TABLET | Freq: Three times a day (TID) | ORAL | 0 refills | Status: DC | PRN

## 2019-06-30 MED ORDER — VITAMIN B-12 1000 MCG PO TABS: 1000.0000 ug | ORAL_TABLET | Freq: Every day | ORAL | 2 refills | Status: DC

## 2019-06-30 MED ORDER — PANTOPRAZOLE SODIUM 40 MG PO TBEC: 40.0000 mg | DELAYED_RELEASE_TABLET | Freq: Every day | ORAL | 1 refills | Status: DC

## 2019-06-30 MED ORDER — SERTRALINE HCL 50 MG PO TABS: 50.0000 mg | ORAL_TABLET | Freq: Every day | ORAL | 1 refills | Status: DC

## 2019-06-30 NOTE — Progress Notes (Signed)
Nsg Discharge Note  Admit Date:  06/28/2019 Discharge date: 06/30/2019   Jon Bennett to be D/C'd Home  per MD order.  AVS completed.  Patient/caregiver able to verbalize understanding.  Discharge Medication: Allergies as of 06/30/2019      Reactions   Asa [aspirin] Shortness Of Breath      Medication List    STOP taking these medications   chlordiazePOXIDE 25 MG capsule Commonly known as: LIBRIUM     TAKE these medications   acetaminophen 500 MG tablet Commonly known as: TYLENOL Take 500 mg by mouth daily as needed for mild pain or moderate pain.   baclofen 10 MG tablet Commonly known as: LIORESAL Take 1 tablet (10 mg total) by mouth daily as needed for muscle spasms. What changed: how much to take   clopidogrel 75 MG tablet Commonly known as: PLAVIX Take 75 mg by mouth daily.   doxepin 25 MG capsule Commonly known as: SINEQUAN Take 25 mg by mouth at bedtime.   famotidine 20 MG tablet Commonly known as: PEPCID Take 1 tablet (20 mg total) by mouth at bedtime. What changed: when to take this   metoprolol tartrate 25 MG tablet Commonly known as: LOPRESSOR Take 1 tablet (25 mg total) by mouth 2 (two) times daily.   multivitamin with minerals tablet Take 1 tablet by mouth daily.   ondansetron 8 MG disintegrating tablet Commonly known as: Zofran ODT Take 1 tablet (8 mg total) by mouth every 8 (eight) hours as needed for nausea or vomiting.   pantoprazole 40 MG tablet Commonly known as: PROTONIX Take 1 tablet (40 mg total) by mouth daily. Start taking on: July 01, 2019   sertraline 50 MG tablet Commonly known as: ZOLOFT Take 1 tablet (50 mg total) by mouth daily. What changed:   medication strength  how much to take   vitamin B-12 1000 MCG tablet Commonly known as: CYANOCOBALAMIN Take 1 tablet (1,000 mcg total) by mouth daily.   Vitamin D (Ergocalciferol) 1.25 MG (50000 UNIT) Caps capsule Commonly known as: DRISDOL Take 50,000 Units by mouth every 7  (seven) days.       Discharge Assessment: Vitals:   06/30/19 1101 06/30/19 1300  BP: 119/68 109/63  Pulse: 69 79  Resp: 18 18  Temp: 98.6 F (37 C) 99 F (37.2 C)  SpO2: 99% 98%   Skin clean, dry and intact without evidence of skin break down, no evidence of skin tears noted. IV catheter discontinued intact. Site without signs and symptoms of complications - no redness or edema noted at insertion site, patient denies c/o pain - only slight tenderness at site.  Dressing with slight pressure applied.  D/c Instructions-Education: Discharge instructions given to patient/family with verbalized understanding. D/c education completed with patient/family including follow up instructions, medication list, d/c activities limitations if indicated, with other d/c instructions as indicated by MD - patient able to verbalize understanding, all questions fully answered. Patient instructed to return to ED, call 911, or call MD for any changes in condition.  Patient escorted via WC, and D/C home via private auto.  Jethro Poling, RN 06/30/2019 6:47 PM

## 2019-06-30 NOTE — Discharge Summary (Signed)
Physician Discharge Summary  Jon Bennett MWN:[id number] DOB: [birth year] DOA: 06/28/2019  PCP: Jon Cotton, MD  Admit date: 06/28/2019 Discharge date: 06/30/2019  Time spent: 35 minutes  Recommendations for Outpatient Follow-up:  1. Repeat basic metabolic panel to follow across renal function 2. Reassess blood pressure and adjust antihypertensive regimen as needed. 3. If further episode of dyskinesia or akathisia recur will recommend outpatient follow-up with neurology service.   Discharge Diagnoses:  Principal Problem:   Akathisia Active Problems:   Thrombocytopenia ([name]   GERD (gastroesophageal reflux disease)   Nausea and vomiting   Acathisia   Dyskinesia   Absolute anemia   Discharge Condition: Stable and improved.  Patient discharged home with instruction to follow-up with PCP in 10 days.  Patient was seen by PT who recommended no further follow-up or intervention at this point.  Patient at baseline functioning state and not at risk for falls.  CODE STATUS: Full code.  Diet recommendation: Modified carbohydrate and heart healthy diet.  Filed Weights   06/28/19 2053  Weight: 65.8 kg    History of present illness:  As per H&P written by Dr. Robb Bennett on 06/29/2019 85 y.o. male with medical history significant of type 2 diabetes, GERD, hypertension, history of alcoholic pancreatitis, history of EtOH abuse in remission since being admitted in February who is coming to the emergency department with complaints of having nausea and 2 episodes of emesis earlier.  He denies abdominal pain, He mentions that he has not been feeling well since yesterday.  He was reported to be anxious and fidgeting in triage and has been having purposeless movements in the ED room since.  He denies abdominal pain, diarrhea, constipation, melena or hematochezia.  No headache, fever, chills, rhinorrhea, sore throat, dyspnea, wheezing or hemoptysis.  He denies  chest pain, palpitations, dizziness, diaphoresis, PND, orthopnea or pitting edema of the lower extremities.  He has not had dysuria, frequency or hematuria.  No polyuria, polydipsia, polyphagia or blurred vision.  ED Course: Initial vital signs were temperature 98.4 F, pulse 73, respiration 20, blood pressure 170/90 mmHg and O2 sat 94% on room air.  The patient was given a 1000 mL NS bolus and 4 mg of ondansetron IVP x1.    CBC shows white count of 8.4 with 76% neutrophils, 30% lymphocytes and 9% monocytes.  Hemoglobin 13.4 g/dL and platelets of 403.  UDS was negative.  Alcohol level was normal.  Lipase was only 15 units/L.  His CMP shows a glucose of 124 mg/dL other chemistry resulted values are normal.  EKG sinus rhythm with left axis deviation with old infarct, minimal ST depression.  No significant change when compared to previous.  Imaging: CT head did not show any acute intracranial abnormality.  Stable parenchymal volume loss and chronic microvascular angiopathy.  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  Trace left mastoid effusion.  Please see images and full radiology report for further details.  Hospital Course:  1-akathisia/dyskinesia -Probably attributed to electrolytes abnormalities and mild dehydration. -No further episodes appreciated since hospitalization -CT head negative for abnormalities -Outpatient follow-up with neurology will be recommended if needed. -Borderline low B12 deficiency found on his work-up; discharge and B12 oral supplementation along with a multivitamin to further provide supplementation of folic acid and thiamine. -Patient instructed to follow-up with his PCP in 10 days. -Safe to resume home antidepressant/anxiolytic medication and adjusted dose.  2-nausea/vomiting/dehydration -In the setting of viral gastroenteritis  versus GERD -Improved/resolved at time of discharge -Negative Covid test. -Patient instructed to maintain  adequate hydration -Continue the use of Pepcid nightly along with Protonix on daily basis. -Lifestyle changes on his diet discussed with patient.  3-history of thrombocytopenia -Stable -No signs of overt bleeding -Recommend CBC to further follow platelets stability at follow-up visit.  4-essential hypertension -Overall stable and well-controlled -Resume home antihypertensive regimen -Patient advised to follow heart healthy diet.  5-type 2 diabetes mellitus -Continue modified carbohydrate diet -Prior to admission not taking any hypoglycemic agents. -Continue close monitoring as an outpatient.  6-depression/anxiety -Stable mood at discharge. -Continue the use of doxepin and sertraline.  Procedures:  See below for x-ray reports.  Consultations:  None  Discharge Exam: Vitals:   06/30/19 1101 06/30/19 1300  BP: 119/68 109/63  Pulse: 69 79  Resp: 18 18  Temp: 98.6 F (37 C) 99 F (37.2 C)  SpO2: 99% 98%    General: Afebrile, no chest pain, no nausea, no vomiting, reports being able to tolerate diet and feeling ready to go home.  No further episodes of dyskinesia or abnormal movements appreciated. Cardiovascular: S1 and S2, no rubs, no murmurs; no JVD. Respiratory: Clear to auscultation bilaterally; normal respiratory effort.  Good oxygen saturation on room air. Abdomen: Soft, nontender, nondistended, positive bowel sounds.  Discharge Instructions   Discharge Instructions    Diet - low sodium heart healthy   Complete by: As directed    Discharge instructions   Complete by: As directed    Take medications as prescribed Maintain adequate hydration Follow-up with PCP in 10 days.     Allergies as of 06/30/2019      Reactions   Asa [aspirin] Shortness Of Breath      Medication List    STOP taking these medications   chlordiazePOXIDE 25 MG capsule Commonly known as: LIBRIUM     TAKE these medications   acetaminophen 500 MG tablet Commonly known as:  TYLENOL Take 500 mg by mouth daily as needed for mild pain or moderate pain.   baclofen 10 MG tablet Commonly known as: LIORESAL Take 1 tablet (10 mg total) by mouth daily as needed for muscle spasms. What changed: how much to take   clopidogrel 75 MG tablet Commonly known as: PLAVIX Take 75 mg by mouth daily.   doxepin 25 MG capsule Commonly known as: SINEQUAN Take 25 mg by mouth at bedtime.   famotidine 20 MG tablet Commonly known as: PEPCID Take 1 tablet (20 mg total) by mouth at bedtime. What changed: when to take this   metoprolol tartrate 25 MG tablet Commonly known as: LOPRESSOR Take 1 tablet (25 mg total) by mouth 2 (two) times daily.   multivitamin with minerals tablet Take 1 tablet by mouth daily.   ondansetron 8 MG disintegrating tablet Commonly known as: Zofran ODT Take 1 tablet (8 mg total) by mouth every 8 (eight) hours as needed for nausea or vomiting.   pantoprazole 40 MG tablet Commonly known as: PROTONIX Take 1 tablet (40 mg total) by mouth daily. Start taking on: July 01, 2019   sertraline 50 MG tablet Commonly known as: ZOLOFT Take 1 tablet (50 mg total) by mouth daily. What changed:   medication strength  how much to take   vitamin B-12 1000 MCG tablet Commonly known as: CYANOCOBALAMIN Take 1 tablet (1,000 mcg total) by mouth daily.   Vitamin D (Ergocalciferol) 1.25 MG (50000 UNIT) Caps capsule Commonly known as: DRISDOL Take 50,000 Units by mouth  every 7 (seven) days.      Allergies  Allergen Reactions  . Asa [Aspirin] Shortness Of Breath   Follow-up Information    Jon Cotton, MD. Schedule an appointment as soon as possible for a visit in 10 day(s).   Specialty: Family Medicine Contact information: [address] [place] [address] [phone]           The results of significant diagnostics from this hospitalization (including imaging, microbiology, ancillary and laboratory) are listed below for reference.     Significant Diagnostic Studies: CT Head Wo Contrast  Result Date: 06/28/2019 CLINICAL DATA:  Ataxia, stroke suspected, episodic emesis and nausea EXAM: CT HEAD WITHOUT CONTRAST TECHNIQUE: Contiguous axial images were obtained from the base of the skull through the vertex without intravenous contrast. 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 No evidence of acute infarction, hemorrhage, hydrocephalus, extra-axial collection or mass lesion/mass effect. Symmetric prominence of the ventricles, cisterns and sulci compatible with parenchymal volume loss. Patchy and confluent areas of white matter hypoattenuation are most compatible with chronic microvascular angiopathy. Benign dural calcifications are similar to comparison is. Vascular: Atherosclerotic calcification of the carotid siphons. No hyperdense vessel. Skull: No calvarial fracture or suspicious osseous lesion. No scalp swelling or hematoma. Sinuses/Orbits: Paranasal sinuses are predominantly clear. There is mild rightward nasal septal deviation. Trace left mastoid effusion. Right mastoid air cells are predominantly clear. Middle ear cavities are clear. Included orbital structures are unremarkable. Other: None IMPRESSION: 1. No acute intracranial abnormality. 2. Stable parenchymal volume loss and chronic microvascular angiopathy. 3. 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 4. Trace left mastoid effusion. Electronically Signed   By: Kreg Shropshire M.D.   On: 06/28/2019 23:54   Labs: Basic Metabolic Panel: Recent Labs  Lab 06/28/19 2143  NA 137  K 4.2  CL 102  CO2 25  GLUCOSE 124*  BUN 13  CREATININE 1.15  CALCIUM 9.6   Liver Function Tests: Recent Labs  Lab 06/28/19 2143  AST 19  ALT 11  ALKPHOS 75  BILITOT 0.9  PROT 7.3  ALBUMIN 4.4   Recent Labs  Lab 06/28/19 2143  LIPASE 15   CBC: Recent Labs  Lab 06/28/19 2143  WBC 8.4  NEUTROABS 6.4   HGB 13.4  HCT 40.8  MCV 91.7  PLT 125*    CBG: Recent Labs  Lab 06/29/19 1620 06/29/19 2034 06/30/19 0746 06/30/19 1118  GLUCAP 99 107* 85 106*    Signed:  Vassie Loll MD.  [facility] 06/30/2019, 2:46 PM

## 2019-06-30 NOTE — Evaluation (Signed)
Physical Therapy Evaluation Patient Details Name: Jon Bennett MRN: [id number] DOB: [birth year] Today's Date: 06/30/2019   History of Present Illness  Jon Bennett is a 85 y.o. male with medical history significant of type 2 diabetes, GERD, hypertension, history of alcoholic pancreatitis, history of EtOH abuse in remission since being admitted in February who is coming to the emergency department with complaints of having nausea and 2 episodes of emesis earlier.  He denies abdominal pain, He mentions that he has not been feeling well since yesterday.  He was reported to be anxious and fidgeting in triage and has been having purposeless movements in the ED room since.  He denies abdominal pain, diarrhea, constipation, melena or hematochezia.  No headache, fever, chills, rhinorrhea, sore throat, dyspnea, wheezing or hemoptysis.  He denies chest pain, palpitations, dizziness, diaphoresis, PND, orthopnea or pitting edema of the lower extremities.  He has not had dysuria, frequency or hematuria.  No polyuria, polydipsia, polyphagia or blurred vision.    Clinical Impression  Patient functioning near baseline for functional mobility and gait. Patient demonstrates min unsteadiness upon standing without use of AD and continues to ambulate with unsteadiness. Gait and balance improve with use of RW. Patient educated on use of RW for gait and mobility upon discharge from hospital. Min guard assist with transfers and gait for safety and balance but no physical assist is needed during session. Patient discharged to care of nursing for ambulation daily as tolerated for length of stay.     Follow Up Recommendations No PT follow up;Supervision - Intermittent    Equipment Recommendations  None recommended by PT    Recommendations for Other Services       Precautions / Restrictions Precautions Precautions: Fall Restrictions Weight Bearing Restrictions: No      Mobility  Bed Mobility Overal bed mobility:  Modified Independent             General bed mobility comments: slightly slow  Transfers Overall transfer level: Needs assistance Equipment used: None Transfers: Sit to/from Stand;Stand Pivot Transfers Sit to Stand: Min guard Stand pivot transfers: Min guard       General transfer comment: guard for safety/balance, min/ mod unsteadiness upon standing  Ambulation/Gait Ambulation/Gait assistance: Min guard;Supervision Gait Distance (Feet): 150 Feet Assistive device: None;Rolling walker (2 wheeled) Gait Pattern/deviations: Step-through pattern;Decreased step length - right;Decreased step length - left;Decreased stride length Gait velocity: decreased   General Gait Details: No AD initially and patient demonstrates min/mod unsteadiness with c/o discomfort in feet from bumps on socks. Gait pattern and balance improve with use of RW, no c/o fatigue  Stairs            Wheelchair Mobility    Modified Rankin (Stroke Patients Only)       Balance Overall balance assessment: Needs assistance Sitting-balance support: Feet supported Sitting balance-Leahy Scale: Normal Sitting balance - Comments: seated EOB   Standing balance support: No upper extremity supported;During functional activity Standing balance-Leahy Scale: Fair Standing balance comment: fair without AD, improves to good with RW                             Pertinent Vitals/Pain Pain Assessment: No/denies pain    Home Living Family/patient expects to be discharged to:: Private residence Living Arrangements: Children Available Help at Discharge: Family;Available 24 hours/day Type of Home: House Home Access: Stairs to enter Entrance Stairs-Rails: Left Entrance Stairs-Number of Steps: 3 Home Layout: One level Home Equipment: Dan Humphreys -  2 wheels;Cane - single point;Wheelchair - manual      Prior Function Level of Independence: Independent         Comments: community ambulator, drives      Journalist, newspaper        Extremity/Trunk Assessment   Upper Extremity Assessment Upper Extremity Assessment: Overall WFL for tasks assessed    Lower Extremity Assessment Lower Extremity Assessment: Overall WFL for tasks assessed       Communication   Communication: No difficulties  Cognition Arousal/Alertness: Awake/alert Behavior During Therapy: WFL for tasks assessed/performed Overall Cognitive Status: Within Functional Limits for tasks assessed                                        General Comments      Exercises     Assessment/Plan    PT Assessment Patent does not need any further PT services  PT Problem List Decreased mobility;Decreased balance;Decreased activity tolerance;Decreased strength;Decreased knowledge of use of DME       PT Treatment Interventions      PT Goals (Current goals can be found in the Care Plan section)  Acute Rehab PT Goals Patient Stated Goal: Return home and be active PT Goal Formulation: With patient Time For Goal Achievement: 06/30/19 Potential to Achieve Goals: Good    Frequency     Barriers to discharge        Co-evaluation               AM-PAC PT "6 Clicks" Mobility  Outcome Measure Help needed turning from your back to your side while in a flat bed without using bedrails?: None Help needed moving from lying on your back to sitting on the side of a flat bed without using bedrails?: None Help needed moving to and from a bed to a chair (including a wheelchair)?: A Little Help needed standing up from a chair using your arms (e.g., wheelchair or bedside chair)?: None Help needed to walk in hospital room?: A Little Help needed climbing 3-5 steps with a railing? : A Little 6 Click Score: 21    End of Session Equipment Utilized During Treatment: Gait belt Activity Tolerance: Patient tolerated treatment well Patient left: in bed;with nursing/sitter in room;with bed alarm set;with call bell/phone  within reach Nurse Communication: Mobility status PT Visit Diagnosis: Unsteadiness on feet (R26.81);Other abnormalities of gait and mobility (R26.89);Muscle weakness (generalized) (M62.81)    Time: 4650-3546 PT Time Calculation (min) (ACUTE ONLY): 19 min   Charges:   PT Evaluation $PT Eval Low Complexity: 1 Low PT Treatments $Therapeutic Activity: 8-22 mins        12:39 PM, 06/30/19 Mearl Latin PT, DPT Physical Therapist at [facility]

## 2019-10-27 ENCOUNTER — Encounter (HOSPITAL_COMMUNITY): Payer: Self-pay

## 2019-10-27 ENCOUNTER — Other Ambulatory Visit: Payer: Self-pay

## 2019-10-27 DIAGNOSIS — F329 Major depressive disorder, single episode, unspecified: Secondary | ICD-10-CM | POA: Diagnosis not present

## 2019-10-27 DIAGNOSIS — D696 Thrombocytopenia, unspecified: Secondary | ICD-10-CM | POA: Insufficient documentation

## 2019-10-27 DIAGNOSIS — E119 Type 2 diabetes mellitus without complications: Secondary | ICD-10-CM | POA: Diagnosis not present

## 2019-10-27 DIAGNOSIS — K219 Gastro-esophageal reflux disease without esophagitis: Secondary | ICD-10-CM | POA: Diagnosis not present

## 2019-10-27 DIAGNOSIS — F419 Anxiety disorder, unspecified: Secondary | ICD-10-CM | POA: Insufficient documentation

## 2019-10-27 DIAGNOSIS — Z7901 Long term (current) use of anticoagulants: Secondary | ICD-10-CM | POA: Insufficient documentation

## 2019-10-27 DIAGNOSIS — I82890 Acute embolism and thrombosis of other specified veins: Principal | ICD-10-CM | POA: Insufficient documentation

## 2019-10-27 DIAGNOSIS — F1010 Alcohol abuse, uncomplicated: Secondary | ICD-10-CM | POA: Diagnosis not present

## 2019-10-27 DIAGNOSIS — Z79899 Other long term (current) drug therapy: Secondary | ICD-10-CM | POA: Insufficient documentation

## 2019-10-27 DIAGNOSIS — Z20822 Contact with and (suspected) exposure to covid-19: Secondary | ICD-10-CM | POA: Insufficient documentation

## 2019-10-27 DIAGNOSIS — I10 Essential (primary) hypertension: Secondary | ICD-10-CM | POA: Diagnosis not present

## 2019-10-27 DIAGNOSIS — R112 Nausea with vomiting, unspecified: Secondary | ICD-10-CM | POA: Diagnosis present

## 2019-10-27 NOTE — ED Triage Notes (Signed)
Pt reports vomiting since this morning. Pt says his "pancreas acts up when he drinks." pt says he has been drinking ETOH for the last couple of days. Pt says he had 6 shots in the past 2 days, but none today.

## 2019-10-28 ENCOUNTER — Emergency Department (HOSPITAL_COMMUNITY): Payer: Medicare Other

## 2019-10-28 ENCOUNTER — Observation Stay (HOSPITAL_COMMUNITY)
Admission: EM | Admit: 2019-10-28 | Discharge: 2019-10-29 | Disposition: A | Discharge status: Home / Self Care | Payer: Medicare Other | Attending: Internal Medicine | Admitting: Internal Medicine

## 2019-10-28 ENCOUNTER — Observation Stay (HOSPITAL_BASED_OUTPATIENT_CLINIC_OR_DEPARTMENT_OTHER): Payer: Medicare Other

## 2019-10-28 DIAGNOSIS — D696 Thrombocytopenia, unspecified: Secondary | ICD-10-CM | POA: Diagnosis present

## 2019-10-28 DIAGNOSIS — I10 Essential (primary) hypertension: Secondary | ICD-10-CM

## 2019-10-28 DIAGNOSIS — G2571 Drug induced akathisia: Secondary | ICD-10-CM

## 2019-10-28 DIAGNOSIS — I82890 Acute embolism and thrombosis of other specified veins: Secondary | ICD-10-CM | POA: Diagnosis not present

## 2019-10-28 DIAGNOSIS — R112 Nausea with vomiting, unspecified: Secondary | ICD-10-CM | POA: Diagnosis present

## 2019-10-28 LAB — COMPREHENSIVE METABOLIC PANEL
ALT: 13 U/L (ref 0–44)
ALT: 13 U/L (ref 0–44)
AST: 22 U/L (ref 15–41)
AST: 25 U/L (ref 15–41)
Albumin: 4.8 g/dL (ref 3.5–5.0)
Albumin: 4.1 g/dL (ref 3.5–5.0)
Alkaline Phosphatase: 63 U/L (ref 38–126)
Alkaline Phosphatase: 55 U/L (ref 38–126)
Anion gap: 10 (ref 5–15)
Anion gap: 10 (ref 5–15)
BUN: 14 mg/dL (ref 8–23)
BUN: 14 mg/dL (ref 8–23)
CO2: 24 mmol/L (ref 22–32)
CO2: 23 mmol/L (ref 22–32)
Calcium: 9.5 mg/dL (ref 8.9–10.3)
Calcium: 9.1 mg/dL (ref 8.9–10.3)
Chloride: 104 mmol/L (ref 98–111)
Chloride: 103 mmol/L (ref 98–111)
Creatinine, Ser: 1.23 mg/dL (ref 0.61–1.24)
Creatinine, Ser: 1.2 mg/dL (ref 0.61–1.24)
GFR calc Af Amer: >60 mL/min (ref >60)
GFR calc Af Amer: >60 mL/min (ref >60)
GFR calc non Af Amer: 54 mL/min — ABNORMAL LOW (ref >60)
GFR calc non Af Amer: 56 mL/min — ABNORMAL LOW (ref >60)
Glucose, Bld: 110 mg/dL — ABNORMAL HIGH (ref 70–99)
Glucose, Bld: 152 mg/dL — ABNORMAL HIGH (ref 70–99)
Potassium: 4 mmol/L (ref 3.5–5.1)
Potassium: 3.9 mmol/L (ref 3.5–5.1)
Sodium: 138 mmol/L (ref 135–145)
Sodium: 136 mmol/L (ref 135–145)
Total Bilirubin: 0.8 mg/dL (ref 0.3–1.2)
Total Bilirubin: 1 mg/dL (ref 0.3–1.2)
Total Protein: 7.9 g/dL (ref 6.5–8.1)
Total Protein: 7 g/dL (ref 6.5–8.1)

## 2019-10-28 LAB — CBC
HCT: 41.1 % (ref 39.0–52.0)
Hemoglobin: 13.8 g/dL (ref 13.0–17.0)
MCH: 31.3 pg (ref 26.0–34.0)
MCHC: 33.6 g/dL (ref 30.0–36.0)
MCV: 93.2 fL (ref 80.0–100.0)
Platelets: 101 10*3/uL — ABNORMAL LOW (ref 150–400)
RBC: 4.41 MIL/uL (ref 4.22–5.81)
RDW: 13.9 % (ref 11.5–15.5)
WBC: 8.7 10*3/uL (ref 4.0–10.5)
nRBC: 0 % (ref 0.0–0.2)

## 2019-10-28 LAB — URINALYSIS, ROUTINE W REFLEX MICROSCOPIC
Bilirubin Urine: NEGATIVE
Glucose, UA: NEGATIVE mg/dL
Hgb urine dipstick: NEGATIVE
Ketones, ur: NEGATIVE mg/dL
Leukocytes,Ua: NEGATIVE
Nitrite: NEGATIVE
Protein, ur: NEGATIVE mg/dL
Specific Gravity, Urine: 1.025 (ref 1.005–1.030)
pH: 6 (ref 5.0–8.0)

## 2019-10-28 LAB — TROPONIN I (HIGH SENSITIVITY)
Troponin I (High Sensitivity): 14 ng/L (ref <18)
Troponin I (High Sensitivity): 13 ng/L (ref <18)

## 2019-10-28 LAB — CBC WITH DIFFERENTIAL/PLATELET
Abs Immature Granulocytes: 0.01 10*3/uL (ref 0.00–0.07)
Basophils Absolute: 0 10*3/uL (ref 0.0–0.1)
Basophils Relative: 1 %
Eosinophils Absolute: 0.1 10*3/uL (ref 0.0–0.5)
Eosinophils Relative: 1 %
HCT: 43.1 % (ref 39.0–52.0)
Hemoglobin: 14.4 g/dL (ref 13.0–17.0)
Immature Granulocytes: 0 %
Lymphocytes Relative: 13 %
Lymphs Abs: 1.1 10*3/uL (ref 0.7–4.0)
MCH: 31 pg (ref 26.0–34.0)
MCHC: 33.4 g/dL (ref 30.0–36.0)
MCV: 92.7 fL (ref 80.0–100.0)
Monocytes Absolute: 0.6 10*3/uL (ref 0.1–1.0)
Monocytes Relative: 8 %
Neutro Abs: 6.2 10*3/uL (ref 1.7–7.7)
Neutrophils Relative %: 77 %
Platelets: 110 10*3/uL — ABNORMAL LOW (ref 150–400)
RBC: 4.65 MIL/uL (ref 4.22–5.81)
RDW: 13.7 % (ref 11.5–15.5)
WBC: 8 10*3/uL (ref 4.0–10.5)
nRBC: 0 % (ref 0.0–0.2)

## 2019-10-28 LAB — ECHOCARDIOGRAM COMPLETE
Area-P 1/2: 3.91 cm2
S' Lateral: 2.67 cm

## 2019-10-28 LAB — SARS CORONAVIRUS 2 BY RT PCR (HOSPITAL ORDER, PERFORMED IN ~~LOC~~ HOSPITAL LAB): SARS Coronavirus 2: NEGATIVE

## 2019-10-28 LAB — RAPID URINE DRUG SCREEN, HOSP PERFORMED
Amphetamines: NOT DETECTED
Barbiturates: NOT DETECTED
Benzodiazepines: NOT DETECTED
Cocaine: NOT DETECTED
Opiates: NOT DETECTED
Tetrahydrocannabinol: NOT DETECTED

## 2019-10-28 LAB — PHOSPHORUS: Phosphorus: 2.4 mg/dL — ABNORMAL LOW (ref 2.5–4.6)

## 2019-10-28 LAB — HEPARIN LEVEL (UNFRACTIONATED)
Heparin Unfractionated: 0.56 IU/mL (ref 0.30–0.70)
Heparin Unfractionated: 0.48 IU/mL (ref 0.30–0.70)

## 2019-10-28 LAB — MAGNESIUM: Magnesium: 2.1 mg/dL (ref 1.7–2.4)

## 2019-10-28 LAB — LIPASE, BLOOD: Lipase: 18 U/L (ref 11–51)

## 2019-10-28 MED ORDER — SODIUM CHLORIDE 0.9 % IV BOLUS
1000.0000 mL | Freq: Once | INTRAVENOUS | Status: AC
  Administered 2019-10-28: 1000 mL via INTRAVENOUS

## 2019-10-28 MED ORDER — ONDANSETRON HCL 4 MG/2ML IJ SOLN
4.0000 mg | Freq: Once | INTRAMUSCULAR | Status: AC
  Administered 2019-10-28: 4 mg via INTRAVENOUS
  Filled 2019-10-28: qty 2

## 2019-10-28 MED ORDER — THIAMINE HCL 100 MG PO TABS
100.0000 mg | ORAL_TABLET | Freq: Every day | ORAL | Status: DC
  Administered 2019-10-28: 100 mg via ORAL
  Filled 2019-10-28 (×2): qty 1

## 2019-10-28 MED ORDER — CHLORHEXIDINE GLUCONATE CLOTH 2 % EX PADS
6.0000 | MEDICATED_PAD | Freq: Every day | CUTANEOUS | Status: DC
  Administered 2019-10-28 – 2019-10-29 (×2): 6 via TOPICAL

## 2019-10-28 MED ORDER — IOHEXOL 300 MG/ML  SOLN
100.0000 mL | Freq: Once | INTRAMUSCULAR | Status: AC | PRN
  Administered 2019-10-28: 100 mL via INTRAVENOUS

## 2019-10-28 MED ORDER — VITAMIN B-12 1000 MCG PO TABS
1000.0000 ug | ORAL_TABLET | Freq: Every day | ORAL | Status: DC
  Administered 2019-10-28 – 2019-10-29 (×2): 1000 ug via ORAL
  Filled 2019-10-28 (×2): qty 1

## 2019-10-28 MED ORDER — ADULT MULTIVITAMIN W/MINERALS CH
1.0000 | ORAL_TABLET | Freq: Every day | ORAL | Status: DC
  Administered 2019-10-28 – 2019-10-29 (×2): 1 via ORAL
  Filled 2019-10-28 (×2): qty 1

## 2019-10-28 MED ORDER — DOXEPIN HCL 25 MG PO CAPS
25.0000 mg | ORAL_CAPSULE | Freq: Every day | ORAL | Status: DC
  Administered 2019-10-28: 25 mg via ORAL
  Filled 2019-10-28 (×3): qty 1

## 2019-10-28 MED ORDER — HEPARIN (PORCINE) 25000 UT/250ML-% IV SOLN
950.0000 [IU]/h | INTRAVENOUS | Status: DC
  Administered 2019-10-28 – 2019-10-29 (×2): 1000 [IU]/h via INTRAVENOUS
  Filled 2019-10-28 (×2): qty 250

## 2019-10-28 MED ORDER — ONDANSETRON HCL 4 MG/2ML IJ SOLN: 4.0000 mg | Freq: Four times a day (QID) | INTRAMUSCULAR | Status: DC | PRN

## 2019-10-28 MED ORDER — PANTOPRAZOLE SODIUM 40 MG IV SOLR
40.0000 mg | Freq: Two times a day (BID) | INTRAVENOUS | Status: DC
  Administered 2019-10-28 – 2019-10-29 (×3): 40 mg via INTRAVENOUS
  Filled 2019-10-28 (×3): qty 40

## 2019-10-28 MED ORDER — ONDANSETRON HCL 4 MG PO TABS
4.0000 mg | ORAL_TABLET | Freq: Four times a day (QID) | ORAL | Status: DC | PRN
  Administered 2019-10-29: 4 mg via ORAL
  Filled 2019-10-28: qty 1

## 2019-10-28 MED ORDER — FOLIC ACID 1 MG PO TABS
1.0000 mg | ORAL_TABLET | Freq: Every day | ORAL | Status: DC
  Administered 2019-10-28 – 2019-10-29 (×2): 1 mg via ORAL
  Filled 2019-10-28 (×2): qty 1

## 2019-10-28 MED ORDER — MULTI-VITAMIN/MINERALS PO TABS: 1.0000 | ORAL_TABLET | Freq: Every day | ORAL | Status: DC

## 2019-10-28 MED ORDER — ACETAMINOPHEN 650 MG RE SUPP: 650.0000 mg | Freq: Four times a day (QID) | RECTAL | Status: DC | PRN

## 2019-10-28 MED ORDER — SERTRALINE HCL 50 MG PO TABS
50.0000 mg | ORAL_TABLET | Freq: Every day | ORAL | Status: DC
  Administered 2019-10-28 – 2019-10-29 (×2): 50 mg via ORAL
  Filled 2019-10-28 (×2): qty 1

## 2019-10-28 MED ORDER — ACETAMINOPHEN 325 MG PO TABS
650.0000 mg | ORAL_TABLET | Freq: Four times a day (QID) | ORAL | Status: DC | PRN
  Administered 2019-10-28 – 2019-10-29 (×2): 650 mg via ORAL
  Filled 2019-10-28 (×2): qty 2

## 2019-10-28 MED ORDER — LORAZEPAM 2 MG/ML IJ SOLN: 1.0000 mg | INTRAMUSCULAR | Status: DC | PRN

## 2019-10-28 MED ORDER — HEPARIN BOLUS VIA INFUSION
4000.0000 [IU] | Freq: Once | INTRAVENOUS | Status: AC
  Administered 2019-10-28: 4000 [IU] via INTRAVENOUS

## 2019-10-28 MED ORDER — THIAMINE HCL 100 MG/ML IJ SOLN
100.0000 mg | Freq: Every day | INTRAMUSCULAR | Status: DC
  Administered 2019-10-29: 100 mg via INTRAVENOUS
  Filled 2019-10-28: qty 2

## 2019-10-28 MED ORDER — ACETAMINOPHEN 500 MG PO TABS: 500.0000 mg | ORAL_TABLET | Freq: Every day | ORAL | Status: DC | PRN

## 2019-10-28 MED ORDER — VITAMIN D (ERGOCALCIFEROL) 1.25 MG (50000 UNIT) PO CAPS: 50000.0000 [IU] | ORAL_CAPSULE | ORAL | Status: DC

## 2019-10-28 MED ORDER — BACLOFEN 10 MG PO TABS
10.0000 mg | ORAL_TABLET | Freq: Every day | ORAL | Status: DC | PRN
  Administered 2019-10-28: 10 mg via ORAL
  Filled 2019-10-28: qty 1

## 2019-10-28 MED ORDER — METOPROLOL TARTRATE 25 MG PO TABS
25.0000 mg | ORAL_TABLET | Freq: Two times a day (BID) | ORAL | Status: DC
  Administered 2019-10-28 – 2019-10-29 (×2): 25 mg via ORAL
  Filled 2019-10-28 (×2): qty 1

## 2019-10-28 MED ORDER — POTASSIUM CHLORIDE IN NACL 20-0.9 MEQ/L-% IV SOLN
INTRAVENOUS | Status: AC
  Filled 2019-10-28: qty 1000

## 2019-10-28 MED ORDER — LORAZEPAM 1 MG PO TABS: 1.0000 mg | ORAL_TABLET | ORAL | Status: DC | PRN

## 2019-10-28 NOTE — H&P (Signed)
History and Physical  Truth Barot RJJ:[id number] DOB: April 06, 1934 DOA: 10/28/2019   PCP: [name], MD   Patient coming from: Home  Chief Complaint: n/v  HPI:  Jon Bennett is a 85 y.o. male with medical history of Impaired glucose tolerance, alcohol abuse, hypertension, and alcoholic pancreatitis presenting with 1 day history of nausea and vomiting that began on 10/26/2019 around breakfast time.  He stated the he drank five 1 ounce bottles of bourbon on 10/26/2019.  He states that he usually drinks only 2 out of the 7 days of the week.  He has had some subjective chills but denied any fevers, chest pain, shortness breath, cough, hemoptysis, diarrhea, hematochezia, melena.  He has been complaining of some intermittent periumbilical and left-sided abdominal pain lasting only a few minutes the past week.  He denies any hematemesis, hematochezia, melena, dysuria, hematuria.  Because of his vomiting, the patient presented for further evaluation. In the emergency department, the patient was afebrile hemodynamically stable with oxygen saturation 95-97% on room air.  BMP was essentially unremarkable with serum creatinine 1.23.  LFTs were unremarkable.  WBC 8.0, hemoglobin 14.4, platelets 110,000.  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  There is also perigastric varices.  There was no bowel obstruction or inflammation.  The patient was started on IV heparin and admitted for observation.  Assessment/Plan: Splenic vein thrombosis -Echocardiogram -Check SPEP -Factor V Leiden -Prothrombin gene mutation -Antiphospholipid antibody -Continue IV heparin for now with ultimate transition to NOAC  Alcohol abuse -Alcohol withdrawal protocol -Urine drug screen  Essential hypertension -Continue metoprolol  Thrombocytopenia -Secondary to chronic alcohol use and alcoholic liver disease -Patient has also had low B12 in the past -Continue B12  supplementation  Anxiety/depression -Continue Zoloft and doxepin  Nausea and vomiting -Likely secondary to alcoholic gastritis -Start IV Protonix -Clear liquid diet for now        Past Medical History:  Diagnosis Date  . Diabetes mellitus without complication (HCC)   . GERD (gastroesophageal reflux disease)   . Hypertension   . Pancreatitis, alcoholic, acute    Past Surgical History:  Procedure Laterality Date  . VASCULAR SURGERY     Social History:  reports that he has never smoked. He has never used smokeless tobacco. He reports current alcohol use of about 4.0 - 5.0 standard drinks of alcohol per week. He reports that he does not use drugs.   Family History  Problem Relation Age of Onset  . Hypertension Other      Allergies  Allergen Reactions  . Asa [Aspirin] Shortness Of Breath     Prior to Admission medications   Medication Sig Start Date End Date Taking? Authorizing Provider  acetaminophen (TYLENOL) 500 MG tablet Take 500 mg by mouth daily as needed for mild pain or moderate pain.    [provider]  baclofen (LIORESAL) 10 MG tablet Take 1 tablet (10 mg total) by mouth daily as needed for muscle spasms. Patient taking differently: Take 20 mg by mouth daily as needed for muscle spasms.  05/01/19   Sherryll Burger, Pratik D, DO  clopidogrel (PLAVIX) 75 MG tablet Take 75 mg by mouth daily.  02/08/12   [provider]  doxepin (SINEQUAN) 25 MG capsule Take 25 mg by mouth at bedtime. 06/04/19   [provider]  famotidine (PEPCID) 20 MG tablet Take 1 tablet (20 mg total) by mouth at bedtime. 06/30/19   Vassie Loll, MD  metoprolol tartrate (LOPRESSOR) 25 MG tablet Take 1 tablet (25 mg total) by mouth 2 (two) times daily. 08/18/18   Catarina Hartshornat, Amrutha Avera, MD  Multiple Vitamins-Minerals (MULTIVITAMIN WITH MINERALS) tablet Take 1 tablet by mouth daily. 06/30/19 06/29/20  Vassie LollMadera, Carlos, MD  ondansetron (ZOFRAN ODT) 8 MG disintegrating tablet Take 1 tablet (8 mg  total) by mouth every 8 (eight) hours as needed for nausea or vomiting. 06/30/19   Vassie LollMadera, Carlos, MD  pantoprazole (PROTONIX) 40 MG tablet Take 1 tablet (40 mg total) by mouth daily. 07/01/19   Vassie LollMadera, Carlos, MD  sertraline (ZOLOFT) 50 MG tablet Take 1 tablet (50 mg total) by mouth daily. 06/30/19   Vassie LollMadera, Carlos, MD  vitamin B-12 (CYANOCOBALAMIN) 1000 MCG tablet Take 1 tablet (1,000 mcg total) by mouth daily. 06/30/19   Vassie LollMadera, Carlos, MD  Vitamin D, Ergocalciferol, (DRISDOL) 1.25 MG (50000 UNIT) CAPS capsule Take 50,000 Units by mouth every 7 (seven) days.    [provider]    Review of Systems:  Constitutional:  No weight loss, night sweats, Fevers, chills, fatigue.  Head&Eyes: No headache.  No vision loss.  No eye pain or scotoma ENT:  No Difficulty swallowing,Tooth/dental problems,Sore throat,  No ear ache, post nasal drip,  Cardio-vascular:  No chest pain, Orthopnea, PND, swelling in lower extremities,  dizziness, palpitations  GI:  No   diarrhea, loss of appetite, hematochezia, melena, heartburn, indigestion, Resp:  No shortness of breath with exertion or at rest. No cough. No coughing up of blood .No wheezing.No chest wall deformity  Skin:  no rash or lesions.  GU:  no dysuria, change in color of urine, no urgency or frequency. No flank pain.  Musculoskeletal:  No joint pain or swelling. No decreased range of motion. No back pain.  Psych:  No change in mood or affect. No depression or anxiety. Neurologic: No headache, no dysesthesia, no focal weakness, no vision loss. No syncope  Physical Exam: Vitals:   10/28/19 0200 10/28/19 0232 10/28/19 0400 10/28/19 0600  BP: (!) 142/102  129/82 137/73  Pulse:  78 86 74  Resp: 17 (!) 24 (!) 27 (!) 22  Temp:      TempSrc:      SpO2: 98% 96% 95% 93%  Weight:      Height:       General:  A&O x 3, NAD, nontoxic, pleasant/cooperative Head/Eye: No conjunctival hemorrhage, no icterus, [place]/AT, No nystagmus ENT:  No icterus,   No thrush, good dentition, no pharyngeal exudate Neck:  No masses, no lymphadenpathy, no bruits CV:  RRR, no rub, no gallop, no S3 Lung:  Bibasilar crackles. No wheeze Abdomen: soft/NT, +BS, nondistended, no peritoneal signs Ext: No cyanosis, No rashes, No petechiae, No lymphangitis, No edema Neuro: CNII-XII intact, strength 4/5 in bilateral upper and lower extremities, no dysmetria  Labs on Admission:  Basic Metabolic Panel: Recent Labs  Lab 10/28/19 0150  NA 138  K 4.0  CL 104  CO2 24  GLUCOSE 110*  BUN 14  CREATININE 1.23  CALCIUM 9.5   Liver Function Tests: Recent Labs  Lab 10/28/19 0150  AST 22  ALT 13  ALKPHOS 63  BILITOT 0.8  PROT 7.9  ALBUMIN 4.8   Recent Labs  Lab 10/28/19 0150  LIPASE 18   No results for input(s): AMMONIA in the last 168 hours. CBC: Recent Labs  Lab 10/28/19 0150  WBC 8.0  NEUTROABS 6.2  HGB 14.4  HCT 43.1  MCV 92.7  PLT 110*   Coagulation Profile:  No results for input(s): INR, PROTIME in the last 168 hours. Cardiac Enzymes: No results for input(s): CKTOTAL, CKMB, CKMBINDEX, TROPONINI in the last 168 hours. BNP: Invalid input(s): POCBNP CBG: No results for input(s): GLUCAP in the last 168 hours. Urine analysis:    Component Value Date/Time   COLORURINE YELLOW 10/28/2019 0142   APPEARANCEUR HAZY (A) 10/28/2019 0142   LABSPEC 1.025 10/28/2019 0142   PHURINE 6.0 10/28/2019 0142   GLUCOSEU NEGATIVE 10/28/2019 0142   HGBUR NEGATIVE 10/28/2019 0142   BILIRUBINUR NEGATIVE 10/28/2019 0142   KETONESUR NEGATIVE 10/28/2019 0142   PROTEINUR NEGATIVE 10/28/2019 0142   NITRITE NEGATIVE 10/28/2019 0142   LEUKOCYTESUR NEGATIVE 10/28/2019 0142   Sepsis Labs: @LABRCNTIP (procalcitonin:4,lacticidven:4) ) Recent Results (from the past 240 hour(s))  SARS Coronavirus 2 by RT PCR (hospital order, performed in [facility] hospital lab) Nasopharyngeal Nasopharyngeal Swab     Status: None   Collection Time: 10/28/19  5:40 AM   Specimen:  Nasopharyngeal Swab  Result Value Ref Range Status   SARS Coronavirus 2 NEGATIVE NEGATIVE Final    Comment: (NOTE) SARS-CoV-2 target nucleic acids are NOT DETECTED.  The SARS-CoV-2 RNA is generally detectable in upper and lower respiratory specimens during the acute phase of infection. The lowest concentration of SARS-CoV-2 viral copies this assay can detect is 250 copies / mL. A negative result does not preclude SARS-CoV-2 infection and should not be used as the sole basis for treatment or other patient management decisions.  A negative result may occur with improper specimen collection / handling, submission of specimen other than nasopharyngeal swab, presence of viral mutation(s) within the areas targeted by this assay, and inadequate number of viral copies (<250 copies / mL). A negative result must be combined with clinical observations, patient history, and epidemiological information.  Fact Sheet for Patients:   10/30/19  Fact Sheet for Healthcare Providers: BoilerBrush.com.cy  This test is not yet approved or  cleared by the https://pope.com/ FDA and has been authorized for detection and/or diagnosis of SARS-CoV-2 by FDA under an Emergency Use Authorization (EUA).  This EUA will remain in effect (meaning this test can be used) for the duration of the COVID-19 declaration under Section 564(b)(1) of the Act, 21 U.S.C. section 360bbb-3(b)(1), unless the authorization is terminated or revoked sooner.  Performed at [facility], [address], [place], [place]      Radiological Exams on Admission: CT Head Wo Contrast  Result Date: 10/28/2019 CLINICAL DATA:  Altered mental status EXAM: CT HEAD WITHOUT CONTRAST TECHNIQUE: Contiguous axial images were obtained from the base of the skull through the vertex without intravenous contrast. COMPARISON:  06/28/2019 FINDINGS: Brain: No evidence of acute infarction,  hemorrhage, hydrocephalus, extra-axial collection or mass lesion/mass effect. Mild atrophic and ischemic changes are again noted and stable. No findings to suggest acute hemorrhage, acute infarction or space-occupying mass lesion are noted. Prior left cerebellar infarct is noted as well. Vascular: No hyperdense vessel or unexpected calcification. Skull: Normal. Negative for fracture or focal lesion. Sinuses/Orbits: No acute finding. Other: None. IMPRESSION: Chronic atrophic and ischemic changes without acute abnormality. Electronically Signed   By: 06/30/2019 M.D.   On: 10/28/2019 05:01   CT ABDOMEN PELVIS W CONTRAST  Result Date: 10/28/2019 CLINICAL DATA:  Epigastric abdominal pain EXAM: CT ABDOMEN AND PELVIS WITH CONTRAST TECHNIQUE: Multidetector CT imaging of the abdomen and pelvis was performed using the standard protocol following bolus administration of intravenous contrast. CONTRAST:  10/30/2019 OMNIPAQUE IOHEXOL 300 MG/ML  SOLN COMPARISON:  None. FINDINGS: Lower chest: Lung bases are free of acute infiltrate or sizable effusion. A sliding-type hiatal hernia is noted Hepatobiliary: Mild fatty infiltration of the liver is noted. Multiple gallstones are seen. No wall thickening or pericholecystic inflammatory changes are noted. Pancreas: Pancreatic atrophy is seen. No significant inflammatory changes to suggest pancreatitis are noted. Spleen: Spleen is mildly prominent. There are changes consistent with perisplenic varices secondary to thrombosis of the main splenic vein. Some of these extend superiorly around the stomach and distal esophagus Adrenals/Urinary Tract: Adrenal glands are within normal limits. Kidneys demonstrate a normal enhancement pattern. Scattered small cysts are noted bilaterally. No renal calculi or obstructive changes are seen. The bladder is well distended. Stomach/Bowel: No obstructive or inflammatory changes of the colon are noted. The appendix is not well visualized and may have been  surgically removed. No inflammatory changes to suggest appendicitis are noted. Small bowel and stomach appear within normal limits with the exception of the known hiatal hernia. Perigastric varices are identified likely related to the splenic vein thrombosis. Vascular/Lymphatic: Atherosclerotic calcifications are noted. Splenic vein thrombosis is noted. This may be related to prior episodes of pancreatitis despite the lack of inflammatory changes on today's exam. This creates perisplenic and perigastric varices which show collateral flow to both the superior mesenteric vein and coronary vein extending to the main portal vein. Reproductive: Within normal limits. Other: No abdominal wall hernia or abnormality. No abdominopelvic ascites. Musculoskeletal: Degenerative changes of lumbar spine are noted. 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 This is likely sequelae from prior episodes of pancreatitis. Pancreatic atrophy without inflammatory change to suggest pancreatitis at this time. Hiatal hernia. Cholelithiasis without complicating factors. Electronically Signed   By: Alcide Clever M.D.   On: 10/28/2019 04:59    EKG: Independently reviewed. Sinus, nonspecific TWI    Time spent:60 minutes Code Status:   FULL Family Communication:  No Family at bedside Disposition Plan: expect 1-2 day hospitalization Consults called: none DVT Prophylaxis: Billey Chang, DO  [facility] Pager [phone]  If 7PM-7AM, please contact night-coverage [email or web address] Password [email or web address] 10/28/2019, 9:08 AM

## 2019-10-28 NOTE — TOC Progression Note (Signed)
Transition of Care [facility]) - Progression Note    Patient Details  Name: Jon Bennett MRN: [id number] Date of Birth: 24-Nov-1934  Transition of Care [facility]) CM/SW Contact  Barry Brunner, LCSW Phone Number: 10/28/2019, 4:13 PM  Clinical Narrative:    CSW provided substance abuse resources to patient. Patient agreeable to review list. TOC to follow.        Expected Discharge Plan and Services                                                 Social Determinants of Health (SDOH) Interventions    Readmission Risk Interventions No flowsheet data found.

## 2019-10-28 NOTE — Progress Notes (Signed)
ANTICOAGULATION CONSULT NOTE - Initial Consult  Pharmacy Consult for Heparin  Indication: Splenic vein thrombosis  Allergies  Allergen Reactions  . Asa [Aspirin] Shortness Of Breath    Patient Measurements: Height: 5\' 9"  (175.3 cm) Weight: 78.9 kg (174 lb) IBW/kg (Calculated) : 70.7  Vital Signs: Temp: 98.5 F (36.9 C) (08/14 2242) Temp Source: Oral (08/14 2242) BP: 129/82 (08/15 0400) Pulse Rate: 86 (08/15 0400)  Labs: Recent Labs    10/28/19 0150  HGB 14.4  HCT 43.1  PLT 110*  CREATININE 1.23  TROPONINIHS 14    Estimated Creatinine Clearance: 46.3 mL/min (by C-G formula based on SCr of 1.23 mg/dL).   Medical History: Past Medical History:  Diagnosis Date  . Diabetes mellitus without complication (HCC)   . GERD (gastroesophageal reflux disease)   . Hypertension   . Pancreatitis, alcoholic, acute    Assessment: 85 y/o M with splenic vein thrombosis to begin heparin therapy. Hgb 14.4, renal function good, PTA meds reviewed.   Goal of Therapy:  Heparin level 0.3-0.7 units/ml Monitor platelets by anticoagulation protocol: Yes   Plan:  Heparin 4000 units BOLUS Start heparin drip at 1000 units/hr 1500 heparin level Daily CBC/HL Monitor for bleeding  97, PharmD, BCPS Clinical Pharmacist Phone: [phone]

## 2019-10-28 NOTE — Progress Notes (Signed)
ANTICOAGULATION CONSULT NOTE - Initial Consult  Pharmacy Consult for Heparin  Indication: Splenic vein thrombosis  Allergies  Allergen Reactions  . Asa [Aspirin] Shortness Of Breath    Patient Measurements: Height: 5\' 9"  (175.3 cm) Weight: 78.9 kg (174 lb) IBW/kg (Calculated) : 70.7  Vital Signs: BP: 144/101 (08/15 1138) Pulse Rate: 74 (08/15 1138)  Labs: Recent Labs    10/28/19 0150 10/28/19 0425 10/28/19 1012 10/28/19 1455  HGB 14.4  --  13.8  --   HCT 43.1  --  41.1  --   PLT 110*  --  101*  --   HEPARINUNFRC  --   --   --  0.56  CREATININE 1.23  --  1.20  --   TROPONINIHS 14 13  --   --     Estimated Creatinine Clearance: 47.5 mL/min (by C-G formula based on SCr of 1.2 mg/dL).   Medical History: Past Medical History:  Diagnosis Date  . Diabetes mellitus without complication (HCC)   . GERD (gastroesophageal reflux disease)   . Hypertension   . Pancreatitis, alcoholic, acute    Assessment: 85 y/o M with splenic vein thrombosis to begin heparin therapy. Hgb 14.4, renal function good, PTA meds reviewed.    8/15 1600 update: Heparin level: 0.56 IU/mL, within   therapeutic goal range on heparin at 1000 units/hr CBC:  Hb 14.4>13.8    Plates 9/15 (cirrhosis) RN reports no bleeding complications or issues with infusion site   Goal of Therapy:  Heparin level 0.3-0.7 units/ml Monitor platelets by anticoagulation protocol: Yes   Plan:   Continue heparin drip at 1000 units/hr Re-check heparin level ~2300 tonight Daily CBC/HL Monitor for bleeding  [id number], Pharm. D. Clinical Pharmacist 10/28/2019 3:54 PM

## 2019-10-28 NOTE — ED Provider Notes (Addendum)
[facility]ENCY [facility]PARTMENT Provider Note   CSN: [id number] Arrival date & time: 10/27/19  2214     History Chief Complaint  Patient presents with  . Emesis    Jon Bennett is a 85 y.o. male.  Patient with history of diabetes, acid reflux, hypertension, pancreatitis presenting with nausea and vomiting onset yesterday morning after breakfast.  He is not certain what time but states he was vomiting at home every 2 hours before coming to the hospital.  He waited in the waiting room for 3 hours without any vomiting though.  He did have a brief episode of epigastric pain that radiated to his back it felt similar to his previous episodes of pancreatitis but denies any pain currently.  Denies fever.  Denies pain with urination or blood in the urine.  Denies chest pain or shortness of breath.  States he did have 5 alcoholic drinks 2 nights ago which he believes could be causing him to have pancreatitis.  Normally does not drink alcohol but with history of alcohol abuse in the past.  Previous appendectomy.  Previous thoracic surgery for esophageal perforation.  Denies any testicular pain.  Denies any pain with urination or blood in the urine.  Upon arrival to the exam room, patient is having involuntary movements of his arm and legs which he had previously when he was here in April.  The history is provided by the patient.  Emesis Associated symptoms: abdominal pain   Associated symptoms: no diarrhea, no fever, no headaches and no myalgias        Past Medical History:  Diagnosis Date  . Diabetes mellitus without complication (HCC)   . GERD (gastroesophageal reflux disease)   . Hypertension   . Pancreatitis, alcoholic, acute     Patient Active Problem List   Diagnosis Date Noted  . Dyskinesia   . Absolute anemia   . Akathisia 06/29/2019  . Nausea and vomiting 06/29/2019  . Acathisia 06/29/2019  . Acute hyperactive alcohol withdrawal delirium (HCC) 04/24/2019  . Type 2 diabetes  mellitus (HCC)   . Hypertension   . GERD (gastroesophageal reflux disease)   . Thrombocytopenia (HCC) 08/16/2018  . Alcohol withdrawal (HCC) 08/13/2018    Past Surgical History:  Procedure Laterality Date  . VASCULAR SURGERY         Family History  Problem Relation Age of Onset  . Hypertension Other     Social History   Tobacco Use  . Smoking status: Never Smoker  . Smokeless tobacco: Never Used  Vaping Use  . Vaping Use: Never used  Substance Use Topics  . Alcohol use: Yes    Alcohol/week: 4.0 - 5.0 standard drinks    Types: 4 - 5 Shots of liquor per week    Comment: "half a pint of whiskey daily"  . Drug use: Never    Home Medications Prior to Admission medications   Medication Sig Start Date End Date Taking? Authorizing Provider  acetaminophen (TYLENOL) 500 MG tablet Take 500 mg by mouth daily as needed for mild pain or moderate pain.    [provider]  baclofen (LIORESAL) 10 MG tablet Take 1 tablet (10 mg total) by mouth daily as needed for muscle spasms. Patient taking differently: Take 20 mg by mouth daily as needed for muscle spasms.  05/01/19   Sherryll BurgerShah, Pratik D, DO  clopidogrel (PLAVIX) 75 MG tablet Take 75 mg by mouth daily.  02/08/12   [provider]  doxepin (SINEQUAN) 25 MG capsule Take  25 mg by mouth at bedtime. 06/04/19   [provider]  famotidine (PEPCID) 20 MG tablet Take 1 tablet (20 mg total) by mouth at bedtime. 06/30/19   Vassie Loll, MD  metoprolol tartrate (LOPRESSOR) 25 MG tablet Take 1 tablet (25 mg total) by mouth 2 (two) times daily. 08/18/18   Catarina Hartshorn, MD  Multiple Vitamins-Minerals (MULTIVITAMIN WITH MINERALS) tablet Take 1 tablet by mouth daily. 06/30/19 06/29/20  Vassie Loll, MD  ondansetron (ZOFRAN ODT) 8 MG disintegrating tablet Take 1 tablet (8 mg total) by mouth every 8 (eight) hours as needed for nausea or vomiting. 06/30/19   Vassie Loll, MD  pantoprazole (PROTONIX) 40 MG tablet Take 1 tablet (40 mg  total) by mouth daily. 07/01/19   Vassie Loll, MD  sertraline (ZOLOFT) 50 MG tablet Take 1 tablet (50 mg total) by mouth daily. 06/30/19   Vassie Loll, MD  vitamin B-12 (CYANOCOBALAMIN) 1000 MCG tablet Take 1 tablet (1,000 mcg total) by mouth daily. 06/30/19   Vassie Loll, MD  Vitamin D, Ergocalciferol, (DRISDOL) 1.25 MG (50000 UNIT) CAPS capsule Take 50,000 Units by mouth every 7 (seven) days.    [provider]    Allergies    Asa [aspirin]  Review of Systems   Review of Systems  Constitutional: Positive for activity change and appetite change. Negative for fever.  HENT: Negative for congestion and rhinorrhea.   Respiratory: Negative for chest tightness.   Cardiovascular: Negative for chest pain.  Gastrointestinal: Positive for abdominal pain, nausea and vomiting. Negative for diarrhea.  Genitourinary: Negative for dysuria and hematuria.  Musculoskeletal: Negative for back pain and myalgias.  Neurological: Negative for dizziness, weakness and headaches.   all other systems are negative except as noted in the HPI and PMH.    Physical Exam Updated Vital Signs BP (!) 152/85   Pulse 78   Temp 98.5 F (36.9 C) (Oral)   Resp 18   Ht  (1.753 m)   Wt 78.9 kg   SpO2 98%   BMI 25.70 kg/m   Physical Exam Vitals and nursing note reviewed.  Constitutional:      General: He is not in acute distress.    Appearance: He is well-developed.  HENT:     Head: Normocephalic and atraumatic.     Mouth/Throat:     Pharynx: No oropharyngeal exudate.  Eyes:     Conjunctiva/sclera: Conjunctivae normal.     Pupils: Pupils are equal, round, and reactive to light.  Neck:     Comments: No meningismus. Cardiovascular:     Rate and Rhythm: Normal rate and regular rhythm.     Heart sounds: Normal heart sounds. No murmur heard.   Pulmonary:     Effort: Pulmonary effort is normal. No respiratory distress.     Breath sounds: Normal breath sounds.  Abdominal:     Palpations:  Abdomen is soft.     Tenderness: There is abdominal tenderness. There is no guarding or rebound.     Comments: Minimal epigastric tenderness, no guarding or rebound  Musculoskeletal:        General: No tenderness. Normal range of motion.     Cervical back: Normal range of motion and neck supple.  Skin:    General: Skin is warm.  Neurological:     Mental Status: He is alert and oriented to person, place, and time.     Cranial Nerves: No cranial nerve deficit.     Motor: No abnormal muscle tone.  Coordination: Coordination normal.     Comments:  5/5 strength throughout. CN 2-12 intact.Equal grip strength.   Restless movements of bilateral arms and legs.  Patient is aware of these and states he can control them. No asterixis Not present with ambulation or voluntary motor activity. Akathisia suspected   Psychiatric:        Behavior: Behavior normal.     ED Results / Procedures / Treatments   Labs (all labs ordered are listed, but only abnormal results are displayed) Labs Reviewed  CBC WITH DIFFERENTIAL/PLATELET - Abnormal; Notable for the following components:      Result Value   Platelets 110 (*)    All other components within normal limits  COMPREHENSIVE METABOLIC PANEL - Abnormal; Notable for the following components:   Glucose, Bld 110 (*)    GFR calc non Af Amer 54 (*)    All other components within normal limits  URINALYSIS, ROUTINE W REFLEX MICROSCOPIC - Abnormal; Notable for the following components:   APPearance HAZY (*)    All other components within normal limits  SARS CORONAVIRUS 2 BY RT PCR (HOSPITAL ORDER, PERFORMED IN [facility])  LIPASE, BLOOD  HEPARIN LEVEL (UNFRACTIONATED)  TROPONIN I (HIGH SENSITIVITY)  TROPONIN I (HIGH SENSITIVITY)    EKG EKG Interpretation  Date/Time:  Sunday October 28 2019 01:57:23 EDT Ventricular Rate:  82 PR Interval:    QRS Duration: 98 QT Interval:  390 QTC Calculation: 456 R Axis:   -8 Text  Interpretation: Sinus rhythm Ventricular premature complex Baseline wander in lead(s) V5 No significant change was found Confirmed by Glynn Octave [phone]) on 10/28/2019 2:00:29 AM   Radiology CT Head Wo Contrast  Result Date: 10/28/2019 CLINICAL DATA:  Altered mental status EXAM: CT HEAD WITHOUT CONTRAST TECHNIQUE: Contiguous axial images were obtained from the base of the skull through the vertex without intravenous contrast. COMPARISON:  06/28/2019 FINDINGS: Brain: No evidence of acute infarction, hemorrhage, hydrocephalus, extra-axial collection or mass lesion/mass effect. Mild atrophic and ischemic changes are again noted and stable. No findings to suggest acute hemorrhage, acute infarction or space-occupying mass lesion are noted. Prior left cerebellar infarct is noted as well. Vascular: No hyperdense vessel or unexpected calcification. Skull: Normal. Negative for fracture or focal lesion. Sinuses/Orbits: No acute finding. Other: None. IMPRESSION: Chronic atrophic and ischemic changes without acute abnormality. Electronically Signed   By: Alcide Clever M.D.   On: 10/28/2019 05:01   CT ABDOMEN PELVIS W CONTRAST  Result Date: 10/28/2019 CLINICAL DATA:  Epigastric abdominal pain EXAM: CT ABDOMEN AND PELVIS WITH CONTRAST TECHNIQUE: Multidetector CT imaging of the abdomen and pelvis was performed using the standard protocol following bolus administration of intravenous contrast. CONTRAST:  OMNIPAQUE IOHEXOL 300 MG/ML  SOLN COMPARISON:  None. FINDINGS: Lower chest: Lung bases are free of acute infiltrate or sizable effusion. A sliding-type hiatal hernia is noted Hepatobiliary: Mild fatty infiltration of the liver is noted. Multiple gallstones are seen. No wall thickening or pericholecystic inflammatory changes are noted. Pancreas: Pancreatic atrophy is seen. No significant inflammatory changes to suggest pancreatitis are noted. Spleen: Spleen is mildly prominent. 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 Some of these extend superiorly around the stomach and distal esophagus Adrenals/Urinary Tract: Adrenal glands are within normal limits. Kidneys demonstrate a normal enhancement pattern. Scattered small cysts are noted bilaterally. No renal calculi or obstructive changes are seen. The bladder is well distended. Stomach/Bowel: No obstructive or inflammatory changes of  the colon are noted. The appendix is not well visualized and may have been surgically removed. No inflammatory changes to suggest appendicitis are noted. Small bowel and stomach appear within normal limits with the exception of the known hiatal hernia. Perigastric varices are identified likely related to the splenic vein thrombosis. Vascular/Lymphatic: Atherosclerotic calcifications are noted. Splenic vein thrombosis is noted. This may be related to prior episodes of pancreatitis despite the lack of inflammatory changes on today's exam. This creates perisplenic and perigastric varices which show collateral flow to both the superior mesenteric vein and coronary vein extending to the main portal vein. Reproductive: Within normal limits. Other: No abdominal wall hernia or abnormality. No abdominopelvic ascites. Musculoskeletal: Degenerative changes of lumbar spine are noted. 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 This is likely sequelae from prior episodes of pancreatitis. Pancreatic atrophy without inflammatory change to suggest pancreatitis at this time. Hiatal hernia. Cholelithiasis without complicating factors. Electronically Signed   By: Alcide Clever M.D.   On: 10/28/2019 04:59    Procedures .Critical Care Performed by: Glynn Octave, MD Authorized by: Glynn Octave, MD   Critical care provider statement:    Critical care time (minutes):  35    Critical care was necessary to treat or prevent imminent or life-threatening deterioration of the following conditions: splenic vein thrombosis.   Critical care was time spent personally by me on the following activities:  Discussions with consultants, evaluation of patient's response to treatment, examination of patient, ordering and performing treatments and interventions, ordering and review of laboratory studies, ordering and review of radiographic studies, pulse oximetry, re-evaluation of patient's condition, obtaining history from patient or surrogate and review of old charts   (including critical care time)  Medications Ordered in ED Medications  sodium chloride 0.9 % bolus 1,000 mL (has no administration in time range)  ondansetron (ZOFRAN) injection 4 mg (has no administration in time range)    ED Course  I have reviewed the triage vital signs and the nursing notes.  Pertinent labs & imaging results that were available during my care of the patient were reviewed by me and considered in my medical decision making (see chart for details).    MDM Rules/Calculators/A&P                         History of pancreatitis here with nausea and vomiting after alcohol use.  Denies any abdominal pain currently.  Abdomen soft without peritoneal signs. Will hydrate and check labs.  Patient was hospitalized in April for the akathisia and rhythmic movements.  They were thought to be due to electrolyte abnormalities and mild dehydration. He was supposed to followup with neurology but did not.   LFTs and lipase within normal limits.  No leukocytosis.  No fever.  No abdominal imaging within the system.  CT scan shows no evidence of acute pancreatitis but does show sequelae of chronic pancreatitis as well as concerning findings of splenic vein thrombus.  Patient still having intermittent akathisia.  CIWA score 0.  No tremors.  Low suspicion for alcohol withdrawal.  He is not tachycardic.  We will  start heparin drip for new splenic vein thrombosis.  Plan for admission. Discussed with Dr. Thomes Dinning.  Final Clinical Impression(s) / ED Diagnoses Final diagnoses:  Splenic vein thrombosis  Akathisia    Rx / DC Orders ED Discharge Orders    None  Glynn Octave, MD 10/28/19 Ulis Rias    Glynn Octave, MD 10/28/19 [phone]

## 2019-10-28 NOTE — Progress Notes (Signed)
  Echocardiogram 2D Echocardiogram has been performed.  Jon Bennett 10/28/2019, 11:08 AM

## 2019-10-29 DIAGNOSIS — I82890 Acute embolism and thrombosis of other specified veins: Secondary | ICD-10-CM | POA: Diagnosis not present

## 2019-10-29 DIAGNOSIS — R112 Nausea with vomiting, unspecified: Secondary | ICD-10-CM | POA: Diagnosis not present

## 2019-10-29 DIAGNOSIS — D696 Thrombocytopenia, unspecified: Secondary | ICD-10-CM | POA: Diagnosis not present

## 2019-10-29 LAB — COMPREHENSIVE METABOLIC PANEL
ALT: 11 U/L (ref 0–44)
AST: 22 U/L (ref 15–41)
Albumin: 3.6 g/dL (ref 3.5–5.0)
Alkaline Phosphatase: 44 U/L (ref 38–126)
Anion gap: 7 (ref 5–15)
BUN: 13 mg/dL (ref 8–23)
CO2: 23 mmol/L (ref 22–32)
Calcium: 8.8 mg/dL — ABNORMAL LOW (ref 8.9–10.3)
Chloride: 107 mmol/L (ref 98–111)
Creatinine, Ser: 1.14 mg/dL (ref 0.61–1.24)
GFR calc Af Amer: >60 mL/min (ref >60)
GFR calc non Af Amer: 60 mL/min — ABNORMAL LOW (ref >60)
Glucose, Bld: 93 mg/dL (ref 70–99)
Potassium: 4.6 mmol/L (ref 3.5–5.1)
Sodium: 137 mmol/L (ref 135–145)
Total Bilirubin: 0.8 mg/dL (ref 0.3–1.2)
Total Protein: 6 g/dL — ABNORMAL LOW (ref 6.5–8.1)

## 2019-10-29 LAB — CBC
HCT: 38.7 % — ABNORMAL LOW (ref 39.0–52.0)
Hemoglobin: 12.6 g/dL — ABNORMAL LOW (ref 13.0–17.0)
MCH: 30.8 pg (ref 26.0–34.0)
MCHC: 32.6 g/dL (ref 30.0–36.0)
MCV: 94.6 fL (ref 80.0–100.0)
Platelets: 71 10*3/uL — ABNORMAL LOW (ref 150–400)
RBC: 4.09 MIL/uL — ABNORMAL LOW (ref 4.22–5.81)
RDW: 14.1 % (ref 11.5–15.5)
WBC: 4.9 10*3/uL (ref 4.0–10.5)
nRBC: 0 % (ref 0.0–0.2)

## 2019-10-29 LAB — MRSA PCR SCREENING: MRSA by PCR: NEGATIVE

## 2019-10-29 LAB — HEPARIN LEVEL (UNFRACTIONATED): Heparin Unfractionated: 0.63 IU/mL (ref 0.30–0.70)

## 2019-10-29 MED ORDER — APIXABAN 5 MG PO TABS
10.0000 mg | ORAL_TABLET | Freq: Two times a day (BID) | ORAL | Status: DC
  Administered 2019-10-29: 10 mg via ORAL
  Filled 2019-10-29: qty 2

## 2019-10-29 MED ORDER — APIXABAN 5 MG PO TABS: 5.0000 mg | ORAL_TABLET | Freq: Two times a day (BID) | ORAL | Status: DC

## 2019-10-29 MED ORDER — APIXABAN 5 MG PO TABS: 10.0000 mg | ORAL_TABLET | Freq: Two times a day (BID) | ORAL | 0 refills | Status: DC

## 2019-10-29 NOTE — Progress Notes (Signed)
ANTICOAGULATION CONSULT NOTE  Pharmacy Consult for Heparin  Indication: Splenic vein thrombosis  Allergies  Allergen Reactions  . Asa [Aspirin] Shortness Of Breath    Patient Measurements: Height: 5\' 9"  (175.3 cm) Weight: 72.5 kg (159 lb 13.3 oz) IBW/kg (Calculated) : 70.7  Vital Signs: Temp: 98.2 F (36.8 C) (08/15 1800) Temp Source: Oral (08/15 1800) BP: 122/59 (08/16 0000) Pulse Rate: 65 (08/16 0000)  Labs: Recent Labs    10/28/19 0150 10/28/19 0425 10/28/19 1012 10/28/19 1455 10/28/19 2308  HGB 14.4  --  13.8  --   --   HCT 43.1  --  41.1  --   --   PLT 110*  --  101*  --   --   HEPARINUNFRC  --   --   --  0.56 0.48  CREATININE 1.23  --  1.20  --   --   TROPONINIHS 14 13  --   --   --     Estimated Creatinine Clearance: 47.5 mL/min (by C-G formula based on SCr of 1.2 mg/dL).  Assessment: 85 y/o male with splenic vein thrombosis for heparin  Goal of Therapy:  Heparin level 0.3-0.7 units/ml Monitor platelets by anticoagulation protocol: Yes   Plan:  Continue Heparin at current rate  97, PharmD, BCPS  10/29/2019 12:19 AM

## 2019-10-29 NOTE — Discharge Summary (Signed)
Physician Discharge Summary  Griffith Santilli TKW:[id number] DOB: Oct 30, 1934 DOA: 10/28/2019  PCP: [name], MD  Admit date: 10/28/2019 Discharge date: 10/29/2019  Admitted From: Home Disposition:  Home   Recommendations for Outpatient Follow-up:  1. Follow up with PCP in 1-2 weeks 2. Please obtain BMP/CBC in one week 3. Please follow up on hypercoagulable work up     Discharge Condition: Stable CODE STATUS: FULL Diet recommendation: Heart Healthy    Brief/Interim Summary: 85 y.o. male with medical history of Impaired glucose tolerance, alcohol abuse, hypertension, and alcoholic pancreatitis presenting with 1 day history of nausea and vomiting that began on 10/26/2019 around breakfast time.  He stated the he drank five 1 ounce bottles of bourbon on 10/26/2019.  He states that he usually drinks only 2 out of the 7 days of the week.  He has had some subjective chills but denied any fevers, chest pain, shortness breath, cough, hemoptysis, diarrhea, hematochezia, melena.  He has been complaining of some intermittent periumbilical and left-sided abdominal pain lasting only a few minutes the past week.  He denies any hematemesis, hematochezia, melena, dysuria, hematuria.  Because of his vomiting, the patient presented for further evaluation. In the emergency department, the patient was afebrile hemodynamically stable with oxygen saturation 95-97% on room air.  BMP was essentially unremarkable with serum creatinine 1.23.  LFTs were unremarkable.  WBC 8.0, hemoglobin 14.4, platelets 110,000.  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  There is also perigastric varices.  There was no bowel obstruction or inflammation.  The patient was started on IV heparin and admitted for observation.  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%, no WMA, trivial TR -Factor V Leiden--pending at time of d/c -Prothrombin gene  mutation--pending at time of d/c -Antiphospholipid antibody--pending -Continue IV heparin>>transition to NOAC x 6 months -outpatient follow up with hematology--referral sent  Alcohol abuse -Alcohol withdrawal protocol -Urine drug screen--neg  Essential hypertension -Continue metoprolol  Thrombocytopenia -Secondary to chronic alcohol use and alcoholic liver disease -Patient has also had low B12 in the past -Continue B12 supplementation  Anxiety/depression -Continue Zoloft and doxepin  Nausea and vomiting -Likely secondary to alcoholic gastritis -Started IV Protonix bid>>home with po protonix -Clear liquid diet initially started.  Advanced to soft diet which patient tolerated   Discharge Instructions  Discharge Instructions    Ambulatory referral to Hematology / Oncology   Complete by: As directed      Allergies as of 10/29/2019      Reactions   Asa [aspirin] Shortness Of Breath      Medication List    STOP taking these medications   clopidogrel 75 MG tablet Commonly known as: PLAVIX     TAKE these medications   acetaminophen 500 MG tablet Commonly known as: TYLENOL Take 500 mg by mouth daily as needed for mild pain or moderate pain.   apixaban 5 MG Tabs tablet Commonly known as: ELIQUIS Take 2 tablets (10 mg total) by mouth 2 (two) times daily. Through 8/22.  On 11/05/19, take 1 tab (5 mg) two times daily   baclofen 10 MG tablet Commonly known as: LIORESAL Take 1 tablet (10 mg total) by mouth daily as needed for muscle spasms. What changed: how much to take   doxepin 25 MG capsule Commonly known as: SINEQUAN Take 25 mg by mouth at bedtime.   famotidine 20 MG tablet Commonly known as: PEPCID Take 1 tablet (20 mg total) by mouth at  bedtime.   metoprolol tartrate 25 MG tablet Commonly known as: LOPRESSOR Take 1 tablet (25 mg total) by mouth 2 (two) times daily.   multivitamin with minerals tablet Take 1 tablet by mouth daily.   ondansetron 8  MG disintegrating tablet Commonly known as: Zofran ODT Take 1 tablet (8 mg total) by mouth every 8 (eight) hours as needed for nausea or vomiting.   pantoprazole 40 MG tablet Commonly known as: PROTONIX Take 1 tablet (40 mg total) by mouth daily.   sertraline 50 MG tablet Commonly known as: ZOLOFT Take 1 tablet (50 mg total) by mouth daily.   vitamin B-12 1000 MCG tablet Commonly known as: CYANOCOBALAMIN Take 1 tablet (1,000 mcg total) by mouth daily.   Vitamin D (Ergocalciferol) 1.25 MG (50000 UNIT) Caps capsule Commonly known as: DRISDOL Take 50,000 Units by mouth every 7 (seven) days.       Allergies  Allergen Reactions  . Asa [Aspirin] Shortness Of Breath    Consultations:  none   Procedures/Studies: CT Head Wo Contrast  Result Date: 10/28/2019 CLINICAL DATA:  Altered mental status EXAM: CT HEAD WITHOUT CONTRAST TECHNIQUE: Contiguous axial images were obtained from the base of the skull through the vertex without intravenous contrast. COMPARISON:  06/28/2019 FINDINGS: Brain: No evidence of acute infarction, hemorrhage, hydrocephalus, extra-axial collection or mass lesion/mass effect. Mild atrophic and ischemic changes are again noted and stable. No findings to suggest acute hemorrhage, acute infarction or space-occupying mass lesion are noted. Prior left cerebellar infarct is noted as well. Vascular: No hyperdense vessel or unexpected calcification. Skull: Normal. Negative for fracture or focal lesion. Sinuses/Orbits: No acute finding. Other: None. IMPRESSION: Chronic atrophic and ischemic changes without acute abnormality. Electronically Signed   By: Alcide Clever M.D.   On: 10/28/2019 05:01   CT ABDOMEN PELVIS W CONTRAST  Result Date: 10/28/2019 CLINICAL DATA:  Epigastric abdominal pain EXAM: CT ABDOMEN AND PELVIS WITH CONTRAST TECHNIQUE: Multidetector CT imaging of the abdomen and pelvis was performed using the standard protocol following bolus administration of  intravenous contrast. CONTRAST:  OMNIPAQUE IOHEXOL 300 MG/ML  SOLN COMPARISON:  None. FINDINGS: Lower chest: Lung bases are free of acute infiltrate or sizable effusion. A sliding-type hiatal hernia is noted Hepatobiliary: Mild fatty infiltration of the liver is noted. Multiple gallstones are seen. No wall thickening or pericholecystic inflammatory changes are noted. Pancreas: Pancreatic atrophy is seen. No significant inflammatory changes to suggest pancreatitis are noted. Spleen: Spleen is mildly prominent. There are changes consistent with perisplenic varices secondary to thrombosis of the main splenic vein. Some of these extend superiorly around the stomach and distal esophagus Adrenals/Urinary Tract: Adrenal glands are within normal limits. Kidneys demonstrate a normal enhancement pattern. Scattered small cysts are noted bilaterally. No renal calculi or obstructive changes are seen. The bladder is well distended. Stomach/Bowel: No obstructive or inflammatory changes of the colon are noted. The appendix is not well visualized and may have been surgically removed. No inflammatory changes to suggest appendicitis are noted. Small bowel and stomach appear within normal limits with the exception of the known hiatal hernia. Perigastric varices are identified likely related to the splenic vein thrombosis. Vascular/Lymphatic: Atherosclerotic calcifications are noted. Splenic vein thrombosis is noted. This may be related to prior episodes of pancreatitis despite the lack of inflammatory changes on today's exam. This creates perisplenic and perigastric varices which show collateral flow to both the superior mesenteric vein and coronary vein extending to the main portal vein. Reproductive: Within normal limits. Other: No  abdominal wall hernia or abnormality. No abdominopelvic ascites. Musculoskeletal: Degenerative changes of lumbar spine are noted. IMPRESSION: Changes of splenic vein thrombosis with mild splenomegaly  and evidence of perigastric and perisplenic varices with collateral flow as described. This is likely sequelae from prior episodes of pancreatitis. Pancreatic atrophy without inflammatory change to suggest pancreatitis at this time. Hiatal hernia. Cholelithiasis without complicating factors. Electronically Signed   By: Alcide Clever M.D.   On: 10/28/2019 04:59   ECHOCARDIOGRAM COMPLETE  Result Date: 10/28/2019    ECHOCARDIOGRAM REPORT   Patient Name:   TREV BOLEY Date of Exam: 10/28/2019 Medical Rec #:  [id number]    Height:       69.0 in Accession #:    [id number]   Weight:       174.0 lb Date of Birth:  [birth year]    BSA:          1.947 m Patient Age:    84 years     BP:           145/78 mmHg Patient Gender: M            HR:           85 bpm. Exam Location:  Inpatient Procedure: 2D Echo Indications:    splenic vein thrombosis  History:        Patient has no prior history of Echocardiogram examinations.                 Risk Factors:Hypertension.  Sonographer:    Delcie Roch RDCS Referring Phys: [phone] Merced Hanners IMPRESSIONS  1. Left ventricular ejection fraction, by estimation, is 60 to 65%. The left ventricle has normal function. The left ventricle has no regional wall motion abnormalities. Left ventricular diastolic parameters were normal.  2. Right ventricular systolic function is normal. The right ventricular size is normal. Tricuspid regurgitation signal is inadequate for assessing PA pressure.  3. The mitral valve is grossly normal. No evidence of mitral valve regurgitation. No evidence of mitral stenosis.  4. The aortic valve is tricuspid. Aortic valve regurgitation is not visualized. No aortic stenosis is present.  5. The inferior vena cava is normal in size with greater than 50% respiratory variability, suggesting right atrial pressure of 3 mmHg. FINDINGS  Left Ventricle: Left ventricular ejection fraction, by estimation, is 60 to 65%. The left ventricle has normal function. The left ventricle has no  regional wall motion abnormalities. The left ventricular internal cavity size was normal in size. There is  no left ventricular hypertrophy. Left ventricular diastolic parameters were normal. Right Ventricle: The right ventricular size is normal. No increase in right ventricular wall thickness. Right ventricular systolic function is normal. Tricuspid regurgitation signal is inadequate for assessing PA pressure. Left Atrium: Left atrial size was normal in size. Right Atrium: Right atrial size was normal in size. Pericardium: Trivial pericardial effusion is present. Mitral Valve: The mitral valve is grossly normal. No evidence of mitral valve regurgitation. No evidence of mitral valve stenosis. Tricuspid Valve: The tricuspid valve is grossly normal. Tricuspid valve regurgitation is trivial. No evidence of tricuspid stenosis. Aortic Valve: The aortic valve is tricuspid. Aortic valve regurgitation is not visualized. No aortic stenosis is present. Pulmonic Valve: The pulmonic valve was grossly normal. Pulmonic valve regurgitation is not visualized. No evidence of pulmonic stenosis. Aorta: The aortic root is normal in size and structure. Venous: The inferior vena cava is normal in size with greater than 50% respiratory variability, suggesting right atrial pressure of 3  mmHg. IAS/Shunts: The atrial septum is grossly normal.  LEFT VENTRICLE PLAX 2D LVIDd:         4.44 cm  Diastology LVIDs:         2.67 cm  LV e' lateral:   6.42 cm/s LV PW:         0.86 cm  LV E/e' lateral: 13.1 LV IVS:        0.90 cm  LV e' medial:    6.96 cm/s LVOT diam:     1.90 cm  LV E/e' medial:  12.1 LV SV:         70 LV SV Index:   36 LVOT Area:     2.84 cm  RIGHT VENTRICLE RV S prime:     17.60 cm/s TAPSE (M-mode): 2.2 cm LEFT ATRIUM             Index       RIGHT ATRIUM           Index LA diam:        3.50 cm 1.80 cm/m  RA Area:     14.00 cm LA Vol (A2C):   34.5 ml 17.72 ml/m RA Volume:   33.10 ml  17.00 ml/m LA Vol (A4C):   48.1 ml 24.70 ml/m  LA Biplane Vol: 40.8 ml 20.95 ml/m  AORTIC VALVE LVOT Vmax:   111.00 cm/s LVOT Vmean:  82.900 cm/s LVOT VTI:    0.247 m  AORTA Ao Root diam: 3.30 cm MITRAL VALVE MV Area (PHT): 3.91 cm    SHUNTS MV Decel Time: 194 msec    Systemic VTI:  0.25 m MV E velocity: 84.00 cm/s  Systemic Diam: 1.90 cm MV A velocity: 81.80 cm/s MV E/A ratio:  1.03 Lennie Odor MD Electronically signed by Lennie Odor MD Signature Date/Time: 10/28/2019/12:20:40 PM    Final         Discharge Exam: Vitals:   10/29/19 0600 10/29/19 0700  BP: 125/88 (!) 162/60  Pulse: 62 (!) 59  Resp:    Temp:    SpO2: 95% 100%   Vitals:   10/29/19 0400 10/29/19 0500 10/29/19 0600 10/29/19 0700  BP: 121/61 130/65 125/88 (!) 162/60  Pulse: (!) 49 (!) 46 62 (!) 59  Resp: 18     Temp: 98.3 F (36.8 C)     TempSrc: Oral     SpO2: 95% 94% 95% 100%  Weight:      Height:        General: Pt is alert, awake, not in acute distress Cardiovascular: RRR, S1/S2 +, no rubs, no gallops Respiratory: CTA bilaterally, no wheezing, no rhonchi Abdominal: Soft, NT, ND, bowel sounds + Extremities: no edema, no cyanosis   The results of significant diagnostics from this hospitalization (including imaging, microbiology, ancillary and laboratory) are listed below for reference.    Significant Diagnostic Studies: CT Head Wo Contrast  Result Date: 10/28/2019 CLINICAL DATA:  Altered mental status EXAM: CT HEAD WITHOUT CONTRAST TECHNIQUE: Contiguous axial images were obtained from the base of the skull through the vertex without intravenous contrast. COMPARISON:  06/28/2019 FINDINGS: Brain: No evidence of acute infarction, hemorrhage, hydrocephalus, extra-axial collection or mass lesion/mass effect. Mild atrophic and ischemic changes are again noted and stable. No findings to suggest acute hemorrhage, acute infarction or space-occupying mass lesion are noted. Prior left cerebellar infarct is noted as well. Vascular: No hyperdense vessel or unexpected  calcification. Skull: Normal. Negative for fracture or focal lesion. Sinuses/Orbits: No acute finding. Other: None. IMPRESSION:  Chronic atrophic and ischemic changes without acute abnormality. Electronically Signed   By: Alcide CleverMark  Lukens M.D.   On: 10/28/2019 05:01   CT ABDOMEN PELVIS W CONTRAST  Result Date: 10/28/2019 CLINICAL DATA:  Epigastric abdominal pain EXAM: CT ABDOMEN AND PELVIS WITH CONTRAST TECHNIQUE: Multidetector CT imaging of the abdomen and pelvis was performed using the standard protocol following bolus administration of intravenous contrast. CONTRAST:  100mL OMNIPAQUE IOHEXOL 300 MG/ML  SOLN COMPARISON:  None. FINDINGS: Lower chest: Lung bases are free of acute infiltrate or sizable effusion. A sliding-type hiatal hernia is noted Hepatobiliary: Mild fatty infiltration of the liver is noted. Multiple gallstones are seen. No wall thickening or pericholecystic inflammatory changes are noted. Pancreas: Pancreatic atrophy is seen. No significant inflammatory changes to suggest pancreatitis are noted. Spleen: Spleen is mildly prominent. There are changes consistent with perisplenic varices secondary to thrombosis of the main splenic vein. Some of these extend superiorly around the stomach and distal esophagus Adrenals/Urinary Tract: Adrenal glands are within normal limits. Kidneys demonstrate a normal enhancement pattern. Scattered small cysts are noted bilaterally. No renal calculi or obstructive changes are seen. The bladder is well distended. Stomach/Bowel: No obstructive or inflammatory changes of the colon are noted. The appendix is not well visualized and may have been surgically removed. No inflammatory changes to suggest appendicitis are noted. Small bowel and stomach appear within normal limits with the exception of the known hiatal hernia. Perigastric varices are identified likely related to the splenic vein thrombosis. Vascular/Lymphatic: Atherosclerotic calcifications are noted. Splenic vein  thrombosis is noted. This may be related to prior episodes of pancreatitis despite the lack of inflammatory changes on today's exam. This creates perisplenic and perigastric varices which show collateral flow to both the superior mesenteric vein and coronary vein extending to the main portal vein. Reproductive: Within normal limits. Other: No abdominal wall hernia or abnormality. No abdominopelvic ascites. Musculoskeletal: Degenerative changes of lumbar spine are noted. 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 This is likely sequelae from prior episodes of pancreatitis. Pancreatic atrophy without inflammatory change to suggest pancreatitis at this time. Hiatal hernia. Cholelithiasis without complicating factors. Electronically Signed   By: Alcide CleverMark  Lukens M.D.   On: 10/28/2019 04:59   ECHOCARDIOGRAM COMPLETE  Result Date: 10/28/2019    ECHOCARDIOGRAM REPORT   Patient Name:   Angela AdamJAMES Brensinger Date of Exam: 10/28/2019 Medical Rec #:  [id number]    Height:       69.0 in Accession #:    [id number]   Weight:       174.0 lb Date of Birth:  07/05/1934    BSA:          1.947 m Patient Age:    84 years     BP:           145/78 mmHg Patient Gender: M            HR:           85 bpm. Exam Location:  Inpatient Procedure: 2D Echo Indications:    splenic vein thrombosis  History:        Patient has no prior history of Echocardiogram examinations.                 Risk Factors:Hypertension.  Sonographer:    Delcie RochLauren Pennington RDCS Referring Phys: [phone] Kiley Torrence IMPRESSIONS  1. Left ventricular ejection fraction, by estimation, is 60 to 65%. The left  ventricle has normal function. The left ventricle has no regional wall motion abnormalities. Left ventricular diastolic parameters were normal.  2. Right ventricular systolic function is normal. The right ventricular size is normal. Tricuspid regurgitation signal is inadequate  for assessing PA pressure.  3. The mitral valve is grossly normal. No evidence of mitral valve regurgitation. No evidence of mitral stenosis.  4. The aortic valve is tricuspid. Aortic valve regurgitation is not visualized. No aortic stenosis is present.  5. The inferior vena cava is normal in size with greater than 50% respiratory variability, suggesting right atrial pressure of 3 mmHg. FINDINGS  Left Ventricle: Left ventricular ejection fraction, by estimation, is 60 to 65%. The left ventricle has normal function. The left ventricle has no regional wall motion abnormalities. The left ventricular internal cavity size was normal in size. There is  no left ventricular hypertrophy. Left ventricular diastolic parameters were normal. Right Ventricle: The right ventricular size is normal. No increase in right ventricular wall thickness. Right ventricular systolic function is normal. Tricuspid regurgitation signal is inadequate for assessing PA pressure. Left Atrium: Left atrial size was normal in size. Right Atrium: Right atrial size was normal in size. Pericardium: Trivial pericardial effusion is present. Mitral Valve: The mitral valve is grossly normal. No evidence of mitral valve regurgitation. No evidence of mitral valve stenosis. Tricuspid Valve: The tricuspid valve is grossly normal. Tricuspid valve regurgitation is trivial. No evidence of tricuspid stenosis. Aortic Valve: The aortic valve is tricuspid. Aortic valve regurgitation is not visualized. No aortic stenosis is present. Pulmonic Valve: The pulmonic valve was grossly normal. Pulmonic valve regurgitation is not visualized. No evidence of pulmonic stenosis. Aorta: The aortic root is normal in size and structure. Venous: The inferior vena cava is normal in size with greater than 50% respiratory variability, suggesting right atrial pressure of 3 mmHg. IAS/Shunts: The atrial septum is grossly normal.  LEFT VENTRICLE PLAX 2D LVIDd:         4.44 cm  Diastology  LVIDs:         2.67 cm  LV e' lateral:   6.42 cm/s LV PW:         0.86 cm  LV E/e' lateral: 13.1 LV IVS:        0.90 cm  LV e' medial:    6.96 cm/s LVOT diam:     1.90 cm  LV E/e' medial:  12.1 LV SV:         70 LV SV Index:   36 LVOT Area:     2.84 cm  RIGHT VENTRICLE RV S prime:     17.60 cm/s TAPSE (M-mode): 2.2 cm LEFT ATRIUM             Index       RIGHT ATRIUM           Index LA diam:        3.50 cm 1.80 cm/m  RA Area:     14.00 cm LA Vol (A2C):   34.5 ml 17.72 ml/m RA Volume:   33.10 ml  17.00 ml/m LA Vol (A4C):   48.1 ml 24.70 ml/m LA Biplane Vol: 40.8 ml 20.95 ml/m  AORTIC VALVE LVOT Vmax:   111.00 cm/s LVOT Vmean:  82.900 cm/s LVOT VTI:    0.247 m  AORTA Ao Root diam: 3.30 cm MITRAL VALVE MV Area (PHT): 3.91 cm    SHUNTS MV Decel Time: 194 msec    Systemic VTI:  0.25 m MV E velocity: 84.00 cm/s  Systemic Diam: 1.90 cm MV A velocity: 81.80 cm/s MV E/A ratio:  1.03 Lennie Odor MD Electronically signed by Lennie Odor MD Signature Date/Time: 10/28/2019/12:20:40 PM    Final      Microbiology: Recent Results (from the past 240 hour(s))  SARS Coronavirus 2 by RT PCR (hospital order, performed in [facility] hospital lab) Nasopharyngeal Nasopharyngeal Swab     Status: None   Collection Time: 10/28/19  5:40 AM   Specimen: Nasopharyngeal Swab  Result Value Ref Range Status   SARS Coronavirus 2 NEGATIVE NEGATIVE Final    Comment: (NOTE) SARS-CoV-2 target nucleic acids are NOT DETECTED.  The SARS-CoV-2 RNA is generally detectable in upper and lower respiratory specimens during the acute phase of infection. The lowest concentration of SARS-CoV-2 viral copies this assay can detect is 250 copies / mL. A negative result does not preclude SARS-CoV-2 infection and should not be used as the sole basis for treatment or other patient management decisions.  A negative result may occur with improper specimen collection / handling, submission of specimen other than nasopharyngeal swab, presence of  viral mutation(s) within the areas targeted by this assay, and inadequate number of viral copies (<250 copies / mL). A negative result must be combined with clinical observations, patient history, and epidemiological information.  Fact Sheet for Patients:   BoilerBrush.com.cy  Fact Sheet for Healthcare Providers: https://pope.com/  This test is not yet approved or  cleared by the [place] FDA and has been authorized for detection and/or diagnosis of SARS-CoV-2 by FDA under an Emergency Use Authorization (EUA).  This EUA will remain in effect (meaning this test can be used) for the duration of the COVID-19 declaration under Section 564(b)(1) of the Act, 21 U.S.C. section 360bbb-3(b)(1), unless the authorization is terminated or revoked sooner.  Performed at [facility], [address], [place], [place] [address]   MRSA PCR Screening     Status: None   Collection Time: 10/28/19  5:30 PM   Specimen: Nasopharyngeal  Result Value Ref Range Status   MRSA by PCR NEGATIVE NEGATIVE Final    Comment:        The GeneXpert MRSA Assay (FDA approved for NASAL specimens only), is one component of a comprehensive MRSA colonization surveillance program. It is not intended to diagnose MRSA infection nor to guide or monitor treatment for MRSA infections. Performed at [facility], [address] [place], [place] [address]      Labs: Basic Metabolic Panel: Recent Labs  Lab 10/28/19 0150 10/28/19 0150 10/28/19 1012 10/29/19 0445  NA 138  --  136 137  K 4.0   < > 3.9 4.6  CL 104  --  103 107  CO2 24  --  23 23  GLUCOSE 110*  --  152* 93  BUN 14  --  14 13  CREATININE 1.23  --  1.20 1.14  CALCIUM 9.5  --  9.1 8.8*  MG  --   --  2.1  --   PHOS  --   --  2.4*  --    < > = values in this interval not displayed.   Liver Function Tests: Recent Labs  Lab 10/28/19 0150 10/28/19 1012 10/29/19 0445  AST ALT ALKPHOS 63 55 44  BILITOT 0.8 1.0 0.8  PROT 7.9 7.0 6.0*  ALBUMIN 4.8 4.1 3.6   Recent Labs  Lab 10/28/19 0150  LIPASE 18   No results for input(s):  AMMONIA in the last 168 hours. CBC: Recent Labs  Lab 10/28/19 0150 10/28/19 1012 10/29/19 0445  WBC 8.0 8.7 4.9  NEUTROABS 6.2  --   --   HGB 14.4 13.8 12.6*  HCT 43.1 41.1 38.7*  MCV 92.7 93.2 94.6  PLT 110* 101* 71*   Cardiac Enzymes: No results for input(s): CKTOTAL, CKMB, CKMBINDEX, TROPONINI in the last 168 hours. BNP: Invalid input(s): POCBNP CBG: No results for input(s): GLUCAP in the last 168 hours.  Time coordinating discharge:  36 minutes  Signed:  Catarina Hartshorn, DO [facility] Pager: [phone] 10/29/2019, 2:58 PM

## 2019-10-29 NOTE — Care Management Obs Status (Signed)
MEDICARE OBSERVATION STATUS NOTIFICATION   Patient Details  Name: Jon Bennett MRN: [id number] Date of Birth: 06/23/1934   Medicare Observation Status Notification Given:  Yes    Corey Harold 10/29/2019, 12:18 PM

## 2019-10-29 NOTE — Progress Notes (Addendum)
ANTICOAGULATION CONSULT NOTE -    Pharmacy Consult for Heparin -->apixaban Indication: Splenic vein thrombosis  Allergies  Allergen Reactions  . Asa [Aspirin] Shortness Of Breath    Patient Measurements: Height: 5\' 9"  (175.3 cm) Weight: 72.5 kg (159 lb 13.3 oz) IBW/kg (Calculated) : 70.7  Vital Signs: Temp: 98.3 F (36.8 C) (08/16 0400) Temp Source: Oral (08/16 0400) BP: 162/60 (08/16 0700) Pulse Rate: 59 (08/16 0700)  Labs: Recent Labs    10/28/19 0150 10/28/19 0150 10/28/19 0425 10/28/19 1012 10/28/19 1455 10/28/19 2308 10/29/19 0445  HGB 14.4   < >  --  13.8  --   --  12.6*  HCT 43.1  --   --  41.1  --   --  38.7*  PLT 110*  --   --  101*  --   --  71*  HEPARINUNFRC  --   --   --   --  0.56 0.48 0.63  CREATININE 1.23  --   --  1.20  --   --  1.14  TROPONINIHS 14  --  13  --   --   --   --    < > = values in this interval not displayed.    Estimated Creatinine Clearance: 50 mL/min (by C-G formula based on SCr of 1.14 mg/dL).   Medical History: Past Medical History:  Diagnosis Date  . Diabetes mellitus without complication (HCC)   . GERD (gastroesophageal reflux disease)   . Hypertension   . 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 Hgb 14.4, renal function good, PTA meds reviewed.    8/16 AM update: Heparin level: 0..63 IU/mL, within   therapeutic goal range on heparin at 1000 units/hr CBC:  Hb 14.4>13.8 >12.6   Plates 97 (cirrhosis) RN reports no bleeding complications or issues with infusion site   Goal of Therapy:  Heparin level 0.3-0.7 units/ml Monitor platelets by anticoagulation protocol: Yes   Plan:  Discontinue heparin infusion now Start apixaban 10mg  bid x7 days, then 5mg  bid  Educate patient  [id number], Pharm. D. Clinical Pharmacist 10/29/2019 10:01 AM

## 2019-10-30 LAB — CARDIOLIPIN ANTIBODIES, IGM+IGG
Anticardiolipin IgG: <9 GPL U/mL (ref 0–14)
Anticardiolipin IgM: 11 MPL U/mL (ref 0–12)

## 2019-10-30 LAB — LUPUS ANTICOAGULANT PANEL
DRVVT: 40.5 s (ref 0.0–47.0)
PTT Lupus Anticoagulant: 33.2 s (ref 0.0–51.9)

## 2019-11-01 LAB — PROTHROMBIN GENE MUTATION

## 2019-11-01 LAB — FACTOR 5 LEIDEN

## 2019-11-07 ENCOUNTER — Ambulatory Visit (HOSPITAL_COMMUNITY): Payer: Medicare Other | Admitting: Hematology

## 2019-11-21 ENCOUNTER — Ambulatory Visit (HOSPITAL_COMMUNITY): Payer: Medicare Other | Admitting: Hematology

## 2019-11-27 ENCOUNTER — Other Ambulatory Visit: Payer: Self-pay

## 2019-11-27 ENCOUNTER — Encounter (HOSPITAL_COMMUNITY): Payer: Self-pay

## 2019-11-28 ENCOUNTER — Inpatient Hospital Stay (HOSPITAL_COMMUNITY): Payer: Medicare Other | Attending: Hematology | Admitting: Hematology

## 2019-11-28 VITALS — BP 146/81 | HR 60 | Temp 97.5°F | Resp 18 | Wt 160.8 lb

## 2019-11-28 DIAGNOSIS — R1033 Periumbilical pain: Secondary | ICD-10-CM | POA: Diagnosis not present

## 2019-11-28 DIAGNOSIS — D696 Thrombocytopenia, unspecified: Secondary | ICD-10-CM

## 2019-11-28 DIAGNOSIS — Z7901 Long term (current) use of anticoagulants: Secondary | ICD-10-CM

## 2019-11-28 DIAGNOSIS — I82890 Acute embolism and thrombosis of other specified veins: Secondary | ICD-10-CM | POA: Diagnosis not present

## 2019-11-28 NOTE — Progress Notes (Signed)
CONSULT NOTE  Patient Care Team: Julieanne CottonPaul, [name], MD as PCP - General (Family Medicine)  CHIEF COMPLAINTS/PURPOSE OF CONSULTATION: Splenic vein thrombosis  HISTORY OF PRESENTING ILLNESS:  Jon Bennett 85 y.o. male was referred to us after ER visit with a diagnosis of splenic vein thrombosis.  Patient presented to the ER with nausea and vomiting and abdominal pain.  After his CT he was diagnosed with thrombus.  Patient reported he was placed on Eliquis.  He denies any previous blood clots.  He denies any provoking circumstances.  He denies any bleeding episodes on the anticoagulant.  He denies a family history of blood clots.  Patient denies smoking or illicit drug use.  He reports he used to drink heavily.  He has weaned himself down to 1 shot and 1 beer daily.  He denies any recent chest pain on exertion, shortness of breath on minimal exertion, presyncopal episodes or palpitations.  He has not noticed any recent bleeding such as epistasis, hematuria or hematochezia.  He denies any over-the-counter NSAID ingestion.  He has no prior history or diagnosis of cancer.  He denies a family history of any cancers or blood clotting disorders.  He lives at home alone and performs all his own ADLs.  MEDICAL HISTORY:  Past Medical History:  Diagnosis Date   Diabetes mellitus without complication (HCC)    Patient is not on any medication for this   GERD (gastroesophageal reflux disease)    Hypertension    Pancreatitis, alcoholic, acute     SURGICAL HISTORY: Past Surgical History:  Procedure Laterality Date   APPENDECTOMY     VASCULAR SURGERY      SOCIAL HISTORY: Social History   Socioeconomic History   Marital status: Widowed    Spouse name: Not on file   Number of children: 4   Years of education: Not on file   Highest education level: Not on file  Occupational History   Occupation: retired  Tobacco Use   Smoking status: Never Smoker   Smokeless tobacco: Never Used   Building services engineerVaping Use   Vaping Use: Never used  Substance and Sexual Activity   Alcohol use: Yes    Alcohol/week: 1.0 standard drink    Types: 1 Shots of liquor per week   Drug use: Never   Sexual activity: Not Currently  Other Topics Concern   Not on file  Social History Narrative   Not on file   Social Determinants of Health   Financial Resource Strain:    Difficulty of Paying Living Expenses: Not on file  Food Insecurity:    Worried About Running Out of Food in the Last Year: Not on file   Ran Out of Food in the Last Year: Not on file  Transportation Needs:    Lack of Transportation (Medical): Not on file   Lack of Transportation (Non-Medical): Not on file  Physical Activity:    Days of Exercise per Week: Not on file   Minutes of Exercise per Session: Not on file  Stress:    Feeling of Stress : Not on file  Social Connections:    Frequency of Communication with Friends and Family: Not on file   Frequency of Social Gatherings with Friends and Family: Not on file   Attends Religious Services: Not on file   Active Member of Clubs or Organizations: Not on file   Attends BankerClub or Organization Meetings: Not on file   Marital Status: Not on file  Intimate Partner Violence:  Fear of Current or Ex-Partner: Not on file   Emotionally Abused: Not on file   Physically Abused: Not on file   Sexually Abused: Not on file    FAMILY HISTORY: Family History  Problem Relation Age of Onset   Hypertension Other    Heart disease Father    Healthy Sister    Healthy Brother    Cancer Maternal Grandfather    Healthy Brother    Healthy Brother    Healthy Sister    Healthy Sister    Healthy Sister    Healthy Brother    Healthy Son    Healthy Son    Healthy Son    Healthy Son     ALLERGIES:  is allergic to asa [aspirin].  MEDICATIONS:  Current Outpatient Medications  Medication Sig Dispense Refill   acetaminophen (TYLENOL) 500 MG tablet Take  500 mg by mouth daily as needed for mild pain or moderate pain. (Patient not taking: Reported on 11/27/2019)     apixaban (ELIQUIS) 5 MG TABS tablet Take 2 tablets (10 mg total) by mouth 2 (two) times daily. Through 8/22.  On 11/05/19, take 1 tab (5 mg) two times daily 73 tablet 0   baclofen (LIORESAL) 10 MG tablet Take 1 tablet (10 mg total) by mouth daily as needed for muscle spasms. (Patient not taking: Reported on 11/27/2019) 30 each 0   busPIRone (BUSPAR) 10 MG tablet Take 10 mg by mouth 2 (two) times daily.     doxepin (SINEQUAN) 25 MG capsule Take 25 mg by mouth at bedtime.     famotidine (PEPCID) 20 MG tablet Take 1 tablet (20 mg total) by mouth at bedtime.     metoprolol tartrate (LOPRESSOR) 25 MG tablet Take 1 tablet (25 mg total) by mouth 2 (two) times daily. 60 tablet 1   Multiple Vitamins-Minerals (MULTIVITAMIN WITH MINERALS) tablet Take 1 tablet by mouth daily. 30 tablet 3   ondansetron (ZOFRAN ODT) 8 MG disintegrating tablet Take 1 tablet (8 mg total) by mouth every 8 (eight) hours as needed for nausea or vomiting. (Patient not taking: Reported on 11/27/2019) 20 tablet 0   pantoprazole (PROTONIX) 40 MG tablet Take 1 tablet (40 mg total) by mouth daily. 30 tablet 1   sertraline (ZOLOFT) 100 MG tablet Take 100 mg by mouth daily.     vitamin B-12 (CYANOCOBALAMIN) 1000 MCG tablet Take 1 tablet (1,000 mcg total) by mouth daily. 30 tablet 2   Vitamin D, Ergocalciferol, (DRISDOL) 1.25 MG (50000 UNIT) CAPS capsule Take 50,000 Units by mouth every 7 (seven) days.     No current facility-administered medications for this visit.    REVIEW OF SYSTEMS:   Constitutional: Denies fevers, chills or abnormal night sweats Respiratory: Denies cough, dyspnea or wheezes Cardiovascular: Denies palpitation, chest discomfort or lower extremity swelling Gastrointestinal:  Denies nausea, heartburn or change in bowel habits Skin: Denies abnormal skin rashes Lymphatics: Denies new lymphadenopathy or  easy bruising Neurological:Denies numbness, tingling or new weaknesses Behavioral/Psych: Mood is stable, no new changes  All other systems were reviewed with the patient and are negative.  PHYSICAL EXAMINATION: ECOG PERFORMANCE STATUS: 0 - Asymptomatic  Vitals:   11/28/19 1246  BP: (!) 146/81  Pulse: 60  Resp: 18  Temp: (!) 97.5 F (36.4 C)  SpO2: 98%   Filed Weights   11/28/19 1246  Weight: 160 lb 12.8 oz (72.9 kg)    GENERAL:alert, no distress and comfortable SKIN: skin color, texture, turgor are normal, no rashes or significant  lesions NECK: supple, thyroid normal size, non-tender, without nodularity LYMPH:  no palpable lymphadenopathy in the cervical, axillary or inguinal LUNGS: clear to auscultation and percussion with normal breathing effort HEART: regular rate & rhythm and no murmurs and no lower extremity edema ABDOMEN:abdomen soft, non-tender and normal bowel sounds Musculoskeletal:no cyanosis of digits and no clubbing  PSYCH: alert & oriented x 3 with fluent speech NEURO: no focal motor/sensory deficits  LABORATORY DATA:  I have reviewed the data as listed Recent Results (from the past 2160 hour(s))  Urinalysis, Routine w reflex microscopic Urine, Clean Catch     Status: Abnormal   Collection Time: 10/28/19  1:42 AM  Result Value Ref Range   Color, Urine YELLOW YELLOW   APPearance HAZY (A) CLEAR   Specific Gravity, Urine 1.025 1.005 - 1.030   pH 6.0 5.0 - 8.0   Glucose, UA NEGATIVE NEGATIVE mg/dL   Hgb urine dipstick NEGATIVE NEGATIVE   Bilirubin Urine NEGATIVE NEGATIVE   Ketones, ur NEGATIVE NEGATIVE mg/dL   Protein, ur NEGATIVE NEGATIVE mg/dL   Nitrite NEGATIVE NEGATIVE   Leukocytes,Ua NEGATIVE NEGATIVE    Comment: Microscopic not done on urines with negative protein, blood, leukocytes, nitrite, or glucose < 500 mg/dL. Performed at [facility], [address] [place], [place] [address]   CBC with Differential/Platelet     Status: Abnormal    Collection Time: 10/28/19  1:50 AM  Result Value Ref Range   WBC 8.0 4.0 - 10.5 K/uL   RBC 4.65 4.22 - 5.81 MIL/uL   Hemoglobin 14.4 13.0 - 17.0 g/dL   HCT 29.5 39 - 52 %   MCV 92.7 80.0 - 100.0 fL   MCH 31.0 26.0 - 34.0 pg   MCHC 33.4 30.0 - 36.0 g/dL   RDW 62.1 30.8 - 65.7 %   Platelets 110 (L) 150 - 400 K/uL    Comment: SPECIMEN CHECKED FOR CLOTS   nRBC 0.0 0.0 - 0.2 %   Neutrophils Relative % 77 %   Neutro Abs 6.2 1.7 - 7.7 K/uL   Lymphocytes Relative 13 %   Lymphs Abs 1.1 0.7 - 4.0 K/uL   Monocytes Relative 8 %   Monocytes Absolute 0.6 0 - 1 K/uL   Eosinophils Relative 1 %   Eosinophils Absolute 0.1 0 - 0 K/uL   Basophils Relative 1 %   Basophils Absolute 0.0 0 - 0 K/uL   Immature Granulocytes 0 %   Abs Immature Granulocytes 0.01 0.00 - 0.07 K/uL    Comment: Performed at [facility], [address], [place], [place] [address]  Comprehensive metabolic panel     Status: Abnormal   Collection Time: 10/28/19  1:50 AM  Result Value Ref Range   Sodium 138 135 - 145 mmol/L   Potassium 4.0 3.5 - 5.1 mmol/L   Chloride 104 98 - 111 mmol/L   CO2 24 22 - 32 mmol/L   Glucose, Bld 110 (H) 70 - 99 mg/dL    Comment: Glucose reference range applies only to samples taken after fasting for at least 8 hours.   BUN 14 8 - 23 mg/dL   Creatinine, Ser 2.95 0.61 - 1.24 mg/dL   Calcium 9.5 8.9 - 28.4 mg/dL   Total Protein 7.9 6.5 - 8.1 g/dL   Albumin 4.8 3.5 - 5.0 g/dL   AST 22 15 - 41 U/L   ALT 13 0 - 44 U/L   Alkaline Phosphatase 63 38 - 126 U/L   Total Bilirubin 0.8 0.3 -  1.2 mg/dL   GFR calc non Af Amer 54 (L) >60 mL/min   GFR calc Af Amer >60 >60 mL/min   Anion gap 10 5 - 15    Comment: Performed at [facility], [address] [place], [place] [address]  Lipase, blood     Status: None   Collection Time: 10/28/19  1:50 AM  Result Value Ref Range   Lipase 18 11 - 51 U/L    Comment: Performed at [facility], [address], [place], [place] [address]  Troponin I (High Sensitivity)      Status: None   Collection Time: 10/28/19  1:50 AM  Result Value Ref Range   Troponin I (High Sensitivity) 14 <18 ng/L    Comment: (NOTE) Elevated high sensitivity troponin I (hsTnI) values and significant  changes across serial measurements may suggest ACS but many other  chronic and acute conditions are known to elevate hsTnI results.  Refer to the "Links" section for chest pain algorithms and additional  guidance. Performed at [facility], [address] [place], [place] [address]   Troponin I (High Sensitivity)     Status: None   Collection Time: 10/28/19  4:25 AM  Result Value Ref Range   Troponin I (High Sensitivity) 13 <18 ng/L    Comment: (NOTE) Elevated high sensitivity troponin I (hsTnI) values and significant  changes across serial measurements may suggest ACS but many other  chronic and acute conditions are known to elevate hsTnI results.  Refer to the "Links" section for chest pain algorithms and additional  guidance. Performed at [facility], [address] [place], [place] [address]   SARS Coronavirus 2 by RT PCR (hospital order, performed in [facility] hospital lab) Nasopharyngeal Nasopharyngeal Swab     Status: None   Collection Time: 10/28/19  5:40 AM   Specimen: Nasopharyngeal Swab  Result Value Ref Range   SARS Coronavirus 2 NEGATIVE NEGATIVE    Comment: (NOTE) SARS-CoV-2 target nucleic acids are NOT DETECTED.  The SARS-CoV-2 RNA is generally detectable in upper and lower respiratory specimens during the acute phase of infection. The lowest concentration of SARS-CoV-2 viral copies this assay can detect is 250 copies / mL. A negative result does not preclude SARS-CoV-2 infection and should not be used as the sole basis for treatment or other patient management decisions.  A negative result may occur with improper specimen collection / handling, submission of specimen other than nasopharyngeal swab, presence of viral mutation(s) within the areas targeted  by this assay, and inadequate number of viral copies (<250 copies / mL). A negative result must be combined with clinical observations, patient history, and epidemiological information.  Fact Sheet for Patients:   BoilerBrush.com.cy  Fact Sheet for Healthcare Providers: https://pope.com/  This test is not yet approved or  cleared by the [place] FDA and has been authorized for detection and/or diagnosis of SARS-CoV-2 by FDA under an Emergency Use Authorization (EUA).  This EUA will remain in effect (meaning this test can be used) for the duration of the COVID-19 declaration under Section 564(b)(1) of the Act, 21 U.S.C. section 360bbb-3(b)(1), unless the authorization is terminated or revoked sooner.  Performed at [facility], [address] [place], [place] [address]   Urine rapid drug screen (hosp performed)     Status: None   Collection Time: 10/28/19  9:55 AM  Result Value Ref Range   Opiates NONE DETECTED NONE DETECTED   Cocaine NONE DETECTED NONE DETECTED   Benzodiazepines NONE DETECTED  NONE DETECTED   Amphetamines NONE DETECTED NONE DETECTED   Tetrahydrocannabinol NONE DETECTED NONE DETECTED   Barbiturates NONE DETECTED NONE DETECTED    Comment: (NOTE) DRUG SCREEN FOR MEDICAL PURPOSES ONLY.  IF CONFIRMATION IS NEEDED FOR ANY PURPOSE, NOTIFY LAB WITHIN 5 DAYS.  LOWEST DETECTABLE LIMITS FOR URINE DRUG SCREEN Drug Class                     Cutoff (ng/mL) Amphetamine and metabolites    1000 Barbiturate and metabolites    200 Benzodiazepine                 200 Tricyclics and metabolites     300 Opiates and metabolites        300 Cocaine and metabolites        300 THC                            50 Performed at [facility], [address] [place], [place] [address]   Factor 5 leiden     Status: None   Collection Time: 10/28/19 10:12 AM  Result Value Ref Range   Recommendations-F5LEID: Comment     Comment:  (NOTE) Result:  Negative (no mutation found) Factor V Leiden is a specific mutation (R506Q) in the factor V gene that is associated with an increased risk of venous thrombosis. Factor V Leiden is more resistant to inactivation by activated protein C.  As a result, factor V persists in the circulation leading to a mild hyper- coagulable state.  The Leiden mutation accounts for 90% - 95% of APC resistance.  Factor V Leiden has been reported in patients with deep vein thrombosis, pulmonary embolus, central retinal vein occlusion, cerebral sinus thrombosis and hepatic vein thrombosis. Other risk factors to be considered in the workup for venous thrombosis include the G20210A mutation in the factor II (prothrombin) gene, protein S and C deficiency, and antithrombin deficiencies. Anticardiolipin antibody and lupus anticoagulant analysis may be appropriate for certain patients, as well as homocysteine levels. Contact your local [facility] for information on how to order additi onal testing if desired. **Genetic counselors are available for health care providers to**  discuss results at [phone] [phone]). Methodology: DNA analysis of the Factor V gene was performed by allele-specific PCR. The diagnostic sensitivity and specificity is >99% for both. Molecular-based testing is highly accurate, but as in any laboratory test, diagnostic errors may occur. All test results must be combined with clinical information for the most accurate interpretation. This test was developed and its performance characteristics determined by [facility]. It has not been cleared or approved by the Food and Drug Administration. References: Voelkerding K (1996).  Clin Lab Med [phone]. Vincenza Hews, PhD, [facility] Ernestene Mention, PhD, [facility] Gillian Shields, PhD, [facility] Manya Silvas, PhD, [facility] Bonnielee Haff, PhD, [facility] Alpha Gula PhD, [facility] Performed At: [facility] [address] [place],  [place] [id number] Maurine Simmering MDPhD [id number]   Prothrombin gene mutation     Status: None   Collection Time: 10/28/19 10:12 AM  Result Value Ref Range   Recommendations-PTGENE: Comment     Comment: (NOTE) NEGATIVE No mutation identified. Comment: A point mutation (G20210A) in the factor II (prothrombin) gene is the second most common cause of inherited thrombophilia. The incidence of this mutation in the [place] Caucasian population is about 2% and in the [place] American population it is approximately 0.5%. This mutation is rare  in the [place] and Native American population. Being heterozygous for a prothrombin mutation increases the risk for developing venous thrombosis about 2 to 3 times above the general population risk. Being homozygous for the prothrombin gene mutation increases the relative risk for venous thrombosis further, although it is not yet known how much further the risk is increased. In women heterozygous for the prothrombin gene mutation, the use of estrogen containing oral contraceptives increases the relative risk of venous thrombosis about 16 times and the risk of developing cerebral thrombosis is also significantly increased. In pregnancy the pr othrombin gene mutation increases risk for venous thrombosis and may increase risk for stillbirth, placental abruption, pre-eclampsia and fetal growth restriction. If the patient possesses two or more congenital or acquired thrombophilic risk factors, the risk for thrombosis may rise to more than the sum of the risk ratios for the individual mutations. This assay detects only the prothrombin G20210A mutation and does not measure genetic abnormalities elsewhere in the genome. Other thrombotic risk factors may be pursued through systematic clinical laboratory analysis. These factors include the R506Q (Leiden) mutation in the Factor V gene, plasma homocysteine levels, as well as testing for deficiencies of antithrombin  III, protein C and protein S. Genetic Counselors are available for health care providers to discuss results at [phone]-GENE [phone]). Methodology: DNA analysis of the Factor II gene was performed by PCR amplification followed by restriction analysis. The di agnostic sensitivity is >99% for both. All the tests must be combined with clinical information for the most accurate interpretation. Molecular-based testing is highly accurate, but as in any laboratory test, diagnostic errors may occur. This test was developed and its performance characteristics determined by [facility]. It has not been cleared or approved by the Food and Drug Administration. Poort SR, et al. Blood. 1996; 84:6962-9528. Varga EA. Circulation. 2004; 110:e15-e18. Marland Kitchen, et al. Arterioscler Thromb Vasc Biol. [phone]. Vincenza Hews, PhD, [facility] Ernestene Mention, PhD, [facility] Gillian Shields, PhD, [facility] Manya Silvas, PhD, [facility] Bonnielee Haff, PhD, [facility] Alpha Gula, PhD, [facility] Performed At: [facility] [address] [place], [place] [id number] Maurine Simmering MDPhD [id number]   Cardiolipin antibodies, IgM+IgG     Status: None   Collection Time: 10/28/19 10:12 AM  Result Value Ref Range   Anticardiolipin IgG <9 0 - 14 GPL U/mL    Comment: (NOTE)                          Negative:              <15                          Indeterminate:     15 - 20                          Low-Med Positive: >20 - 80                          High Positive:         >80    Anticardiolipin IgM 11 0 - 12 MPL U/mL    Comment: (NOTE)                          Negative:              <  13                          Indeterminate:     13 - 20                          Low-Med Positive: >20 - 80                          High Positive:         >80 Performed At: [facility] [address] [place], [place] [id number] Jolene Schimke MD WU:[id number]   Lupus anticoagulant panel     Status: None   Collection  Time: 10/28/19 10:12 AM  Result Value Ref Range   PTT Lupus Anticoagulant 33.2 0.0 - 51.9 sec    Comment: (NOTE) Additional testing confirms the presence of heparin in the test sample. Results obtained after heparin neutralization.    DRVVT 40.5 0.0 - 47.0 sec   Lupus Anticoag Interp Comment:     Comment: (NOTE) No lupus anticoagulant was detected. Performed At: [facility] [address] [place], [place] [id number] Jolene Schimke MD VH:[id number]   Comprehensive metabolic panel     Status: Abnormal   Collection Time: 10/28/19 10:12 AM  Result Value Ref Range   Sodium 136 135 - 145 mmol/L   Potassium 3.9 3.5 - 5.1 mmol/L   Chloride 103 98 - 111 mmol/L   CO2 23 22 - 32 mmol/L   Glucose, Bld 152 (H) 70 - 99 mg/dL    Comment: Glucose reference range applies only to samples taken after fasting for at least 8 hours.   BUN 14 8 - 23 mg/dL   Creatinine, Ser 4.13 0.61 - 1.24 mg/dL   Calcium 9.1 8.9 - 24.4 mg/dL   Total Protein 7.0 6.5 - 8.1 g/dL   Albumin 4.1 3.5 - 5.0 g/dL   AST 25 15 - 41 U/L   ALT 13 0 - 44 U/L   Alkaline Phosphatase 55 38 - 126 U/L   Total Bilirubin 1.0 0.3 - 1.2 mg/dL   GFR calc non Af Amer 56 (L) >60 mL/min   GFR calc Af Amer >60 >60 mL/min   Anion gap 10 5 - 15    Comment: Performed at [facility], [address] [place], [place] [address]  Magnesium     Status: None   Collection Time: 10/28/19 10:12 AM  Result Value Ref Range   Magnesium 2.1 1.7 - 2.4 mg/dL    Comment: Performed at [facility], [address], [place], [place] [address]  Phosphorus     Status: Abnormal   Collection Time: 10/28/19 10:12 AM  Result Value Ref Range   Phosphorus 2.4 (L) 2.5 - 4.6 mg/dL    Comment: Performed at [facility], [address] [place], [place] [address]  CBC     Status: Abnormal   Collection Time: 10/28/19 10:12 AM  Result Value Ref Range   WBC 8.7 4.0 - 10.5 K/uL   RBC 4.41 4.22 - 5.81 MIL/uL   Hemoglobin 13.8 13.0 - 17.0 g/dL   HCT 74.2 39 - 52  %   MCV 93.2 80.0 - 100.0 fL   MCH 31.3 26.0 - 34.0 pg   MCHC 33.6 30.0 - 36.0 g/dL   RDW 59.5 63.8 - 75.6 %   Platelets 101 (L) 150 - 400 K/uL    Comment: REPEATED  TO VERIFY PLATELET COUNT CONFIRMED BY SMEAR SPECIMEN CHECKED FOR CLOTS Immature Platelet Fraction may be clinically indicated, consider ordering this additional test [id number]    nRBC 0.0 0.0 - 0.2 %    Comment: Performed at [facility], [address] [place], [place] [address]  ECHOCARDIOGRAM COMPLETE     Status: None   Collection Time: 10/28/19 11:08 AM  Result Value Ref Range   BP 145/78 mmHg   Area-P 1/2 3.91 cm2   S' Lateral 2.67 cm  Heparin level (unfractionated)     Status: None   Collection Time: 10/28/19  2:55 PM  Result Value Ref Range   Heparin Unfractionated 0.56 0.30 - 0.70 IU/mL    Comment: (NOTE) If heparin results are below expected values, and patient dosage has  been confirmed, suggest follow up testing of antithrombin III levels. Performed at [facility], [address], [place], [place] [address]   MRSA PCR Screening     Status: None   Collection Time: 10/28/19  5:30 PM   Specimen: Nasopharyngeal  Result Value Ref Range   MRSA by PCR NEGATIVE NEGATIVE    Comment:        The GeneXpert MRSA Assay (FDA approved for NASAL specimens only), is one component of a comprehensive MRSA colonization surveillance program. It is not intended to diagnose MRSA infection nor to guide or monitor treatment for MRSA infections. Performed at [facility], [address] [place], [place] [address]   Heparin level (unfractionated)     Status: None   Collection Time: 10/28/19 11:08 PM  Result Value Ref Range   Heparin Unfractionated 0.48 0.30 - 0.70 IU/mL    Comment: (NOTE) If heparin results are below expected values, and patient dosage has  been confirmed, suggest follow up testing of antithrombin III levels. Performed at [facility], [address] [place], [place] [address]   Comprehensive  metabolic panel     Status: Abnormal   Collection Time: 10/29/19  4:45 AM  Result Value Ref Range   Sodium 137 135 - 145 mmol/L   Potassium 4.6 3.5 - 5.1 mmol/L   Chloride 107 98 - 111 mmol/L   CO2 23 22 - 32 mmol/L   Glucose, Bld 93 70 - 99 mg/dL    Comment: Glucose reference range applies only to samples taken after fasting for at least 8 hours.   BUN 13 8 - 23 mg/dL   Creatinine, Ser 0.62 0.61 - 1.24 mg/dL   Calcium 8.8 (L) 8.9 - 10.3 mg/dL   Total Protein 6.0 (L) 6.5 - 8.1 g/dL   Albumin 3.6 3.5 - 5.0 g/dL   AST 22 15 - 41 U/L   ALT 11 0 - 44 U/L   Alkaline Phosphatase 44 38 - 126 U/L   Total Bilirubin 0.8 0.3 - 1.2 mg/dL   GFR calc non Af Amer 60 (L) >60 mL/min   GFR calc Af Amer >60 >60 mL/min   Anion gap 7 5 - 15    Comment: Performed at [facility], [address], [place], [place] [address]  CBC     Status: Abnormal   Collection Time: 10/29/19  4:45 AM  Result Value Ref Range   WBC 4.9 4.0 - 10.5 K/uL   RBC 4.09 (L) 4.22 - 5.81 MIL/uL   Hemoglobin 12.6 (L) 13.0 - 17.0 g/dL   HCT 46.2 (L) 39 - 52 %   MCV 94.6 80.0 - 100.0 fL   MCH 30.8 26.0 - 34.0 pg  MCHC 32.6 30.0 - 36.0 g/dL   RDW 16.1 09.6 - 04.5 %   Platelets 71 (L) 150 - 400 K/uL    Comment: REPEATED TO VERIFY SPECIMEN CHECKED FOR CLOTS CONSISTENT WITH PREVIOUS RESULT    nRBC 0.0 0.0 - 0.2 %    Comment: Performed at [facility], [address] [place], [place] [address]  Heparin level (unfractionated)     Status: None   Collection Time: 10/29/19  4:45 AM  Result Value Ref Range   Heparin Unfractionated 0.63 0.30 - 0.70 IU/mL    Comment: (NOTE) If heparin results are below expected values, and patient dosage has  been confirmed, suggest follow up testing of antithrombin III levels. Performed at [facility], [address], [place], [place] [address]     RADIOGRAPHIC STUDIES: I have personally reviewed the radiological images as listed and agreed with the findings in the report. I have independently  examined this patient and interviewed him.  I agree with HPI dictated by my nurse practitioner Corliss Skains, FNP.  I have independently formulated my assessment and plan.  ASSESSMENT & PLAN:  1.  Splenic vein thrombosis: -Presentation to the ER on 10/28/1999 with epigastric abdominal pain. -Patient had a history of alcohol abuse.  Currently drinks 1 shot daily. -CT AP on 10/28/2019 showed mildly prominent spleen with perisplenic varices secondary to thrombosis of the main splenic vein.  Pancreatic atrophy without inflammatory change to suggest pancreatitis. -He was started on Eliquis after discontinuing Plavix. -He is tolerating Eliquis without any bleeding issues.  No abdominal pain reported. 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 -No family history of clotting disorders or malignancies. -Inpatient work-up including lupus anticoagulant, anticardiolipin antibody, prothrombin gene mutation and factor V Leiden mutation was negative. -I have recommended continuing Eliquis at this time. -RTC in 3 months with repeat CT scan of the abdomen with contrast.  We will also check D-dimer and beta-2 glycoprotein 1 antibody at that time.  2.  Moderate thrombocytopenia: -Last platelet count was 71 on 10/29/2019. -He has mild to moderate thrombocytopenia since February of this year. -Recent CT scan showed mild splenomegaly which could be contributing to thrombocytopenia. -We will closely monitor.   All questions were answered. The patient knows to call the clinic with any problems, questions or concerns.      Doreatha Massed, MD 11/28/19 5:18 PM

## 2019-12-14 ENCOUNTER — Encounter (HOSPITAL_COMMUNITY): Payer: Self-pay | Admitting: Nurse Practitioner

## 2019-12-14 ENCOUNTER — Encounter (HOSPITAL_COMMUNITY): Payer: Self-pay

## 2019-12-14 ENCOUNTER — Other Ambulatory Visit (HOSPITAL_COMMUNITY): Payer: Self-pay | Admitting: *Deleted

## 2019-12-14 MED ORDER — APIXABAN 5 MG PO TABS: 5.0000 mg | ORAL_TABLET | Freq: Two times a day (BID) | ORAL | 2 refills | Status: DC

## 2020-01-25 ENCOUNTER — Emergency Department (HOSPITAL_COMMUNITY): Payer: Medicare Other

## 2020-01-25 ENCOUNTER — Other Ambulatory Visit: Payer: Self-pay

## 2020-01-25 ENCOUNTER — Encounter (HOSPITAL_COMMUNITY): Payer: Self-pay

## 2020-01-25 ENCOUNTER — Emergency Department (HOSPITAL_COMMUNITY)
Admission: EM | Admit: 2020-01-25 | Discharge: 2020-01-25 | Disposition: A | Discharge status: Home / Self Care | Payer: Medicare Other | Attending: Emergency Medicine | Admitting: Emergency Medicine

## 2020-01-25 DIAGNOSIS — Z79899 Other long term (current) drug therapy: Secondary | ICD-10-CM | POA: Insufficient documentation

## 2020-01-25 DIAGNOSIS — Z7901 Long term (current) use of anticoagulants: Secondary | ICD-10-CM | POA: Diagnosis not present

## 2020-01-25 DIAGNOSIS — I10 Essential (primary) hypertension: Secondary | ICD-10-CM | POA: Insufficient documentation

## 2020-01-25 DIAGNOSIS — S0990XA Unspecified injury of head, initial encounter: Secondary | ICD-10-CM | POA: Insufficient documentation

## 2020-01-25 DIAGNOSIS — E119 Type 2 diabetes mellitus without complications: Secondary | ICD-10-CM | POA: Insufficient documentation

## 2020-01-25 DIAGNOSIS — M25551 Pain in right hip: Secondary | ICD-10-CM

## 2020-01-25 DIAGNOSIS — M542 Cervicalgia: Secondary | ICD-10-CM | POA: Diagnosis not present

## 2020-01-25 DIAGNOSIS — Y9301 Activity, walking, marching and hiking: Secondary | ICD-10-CM | POA: Insufficient documentation

## 2020-01-25 DIAGNOSIS — W108XXA Fall (on) (from) other stairs and steps, initial encounter: Secondary | ICD-10-CM | POA: Diagnosis not present

## 2020-01-25 DIAGNOSIS — W19XXXA Unspecified fall, initial encounter: Secondary | ICD-10-CM

## 2020-01-25 HISTORY — DX: Acute embolism and thrombosis of other specified veins: I82.890

## 2020-01-25 LAB — CBG MONITORING, ED: Glucose-Capillary: 97 mg/dL (ref 70–99)

## 2020-01-25 NOTE — ED Triage Notes (Signed)
Pt reports he gets nervous when ambulates and was walking with his walking stick and was going up the steps and fell back. Reports right hip pain and neck pain. Pt reports that he was able to walk to the BR while he has been in the triage and hip not as bad as neck pain

## 2020-01-25 NOTE — ED Provider Notes (Signed)
[facility] EMERGENCY DEPARTMENT Provider Note   CSN: [id number] Arrival date & time: 01/25/20  1246     History Chief Complaint  Patient presents with  . Fall    Jon Bennett is a 85 y.o. male with past medical history significant for diabetes, GERD, hypertension. Anticoagulated on eliquis  HPI Patient presents to emergency department with complaint of mechanical fall.  This happened just prior to arrival.  Patient states he was walking up the stairs with his walking stick and fell backwards.  Usually he held onto the handrail when walking up steps however there was not one on these stairs. He landed on his right hip in the bushes.  He denies any his head or loss of consciousness.  He is endorsing pain in his right hip and neck.  He took a Tylenol prior to arrival which he thinks helped his pain.  He describes the pain as an aching sensation.  Pain in his hip is worse with movement.  Cervical collar was applied by EMS.  Denies any headache, fever, chills, visual changes, back pain, numbness, weakness or tingling.  He has been ambulatory since the fall without assistance.    Past Medical History:  Diagnosis Date  . Diabetes mellitus without complication [facility])    Patient is not on any medication for this  . GERD (gastroesophageal reflux disease)   . Hypertension   . Pancreatitis, alcoholic, acute   . Splenic vein thrombosis     Patient Active Problem List   Diagnosis Date Noted  . 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 Dyskinesia   . Absolute anemia   . Akathisia 06/29/2019  . Nausea and vomiting 06/29/2019  . Acathisia 06/29/2019  . Acute hyperactive alcohol withdrawal delirium (HCC) 04/24/2019  . Type 2 diabetes mellitus (HCC)   . Hypertension   . GERD (gastroesophageal reflux disease)   . Thrombocytopenia (HCC) 08/16/2018  . Alcohol withdrawal (HCC) 08/13/2018    Past Surgical History:  Procedure Laterality Date  . APPENDECTOMY    . VASCULAR SURGERY          Family History  Problem Relation Age of Onset  . Hypertension Other   . Heart disease Father   . Healthy Sister   . Healthy Brother   . Cancer Maternal Grandfather   . Healthy Brother   . Healthy Brother   . Healthy Sister   . Healthy Sister   . Healthy Sister   . Healthy Brother   . Healthy Son   . Healthy Son   . Healthy Son   . Healthy Son     Social History   Tobacco Use  . Smoking status: Never Smoker  . Smokeless tobacco: Never Used  Vaping Use  . Vaping Use: Never used  Substance Use Topics  . Alcohol use: Yes    Alcohol/week: 1.0 standard drink    Types: 1 Shots of liquor per week  . Drug use: Never    Home Medications Prior to Admission medications   Medication Sig Start Date End Date Taking? Authorizing Provider  acetaminophen (TYLENOL) 500 MG tablet Take 500 mg by mouth daily as needed for mild pain or moderate pain. Patient not taking: Reported on 11/27/2019    [provider]  apixaban (ELIQUIS) 5 MG TABS tablet Take 1 tablet (5 mg total) by mouth 2 (two) times daily. 12/14/19   Doreatha Massed, MD  baclofen (LIORESAL) 10 MG tablet Take 1 tablet (10 mg total) by mouth daily as needed for muscle  spasms. Patient not taking: Reported on 11/27/2019 05/01/19   Maurilio Lovely D, DO  busPIRone (BUSPAR) 10 MG tablet Take 10 mg by mouth 2 (two) times daily. 08/27/19   [provider]  doxepin (SINEQUAN) 25 MG capsule Take 25 mg by mouth at bedtime. 06/04/19   [provider]  famotidine (PEPCID) 20 MG tablet Take 1 tablet (20 mg total) by mouth at bedtime. 06/30/19   Vassie Loll, MD  metoprolol tartrate (LOPRESSOR) 25 MG tablet Take 1 tablet (25 mg total) by mouth 2 (two) times daily. 08/18/18   Catarina Hartshorn, MD  Multiple Vitamins-Minerals (MULTIVITAMIN WITH MINERALS) tablet Take 1 tablet by mouth daily. 06/30/19 06/29/20  Vassie Loll, MD  ondansetron (ZOFRAN ODT) 8 MG disintegrating tablet Take 1 tablet (8 mg total) by mouth every 8  (eight) hours as needed for nausea or vomiting. Patient not taking: Reported on 11/27/2019 06/30/19   Vassie Loll, MD  pantoprazole (PROTONIX) 40 MG tablet Take 1 tablet (40 mg total) by mouth daily. 07/01/19   Vassie Loll, MD  sertraline (ZOLOFT) 100 MG tablet Take 100 mg by mouth daily. 08/27/19   [provider]  vitamin B-12 (CYANOCOBALAMIN) 1000 MCG tablet Take 1 tablet (1,000 mcg total) by mouth daily. 06/30/19   Vassie Loll, MD  Vitamin D, Ergocalciferol, (DRISDOL) 1.25 MG (50000 UNIT) CAPS capsule Take 50,000 Units by mouth every 7 (seven) days.    [provider]    Allergies    Asa [aspirin]  Review of Systems   Review of Systems All other systems are reviewed and are negative for acute change except as noted in the HPI.  Physical Exam Updated Vital Signs BP (!) 158/91 (BP Location: Right Arm)   Pulse 71   Temp 97.7 F (36.5 C) (Oral)   Resp 18   Ht 5\' 9"  (1.753 m)   Wt 68 kg   SpO2 97%   BMI 22.15 kg/m   Physical Exam Vitals and nursing note reviewed.  Constitutional:      General: He is not in acute distress.    Appearance: He is not ill-appearing.  HENT:     Head: Normocephalic and atraumatic. No raccoon eyes or Battle's sign.     Jaw: There is normal jaw occlusion.     Comments: No tenderness to palpation of skull. No deformities or crepitus noted. No open wounds, abrasions or lacerations.    Right Ear: Tympanic membrane and external ear normal. No hemotympanum.     Left Ear: Tympanic membrane and external ear normal. No hemotympanum.     Nose: Nose normal.     Right Nostril: No epistaxis or septal hematoma.     Left Nostril: No epistaxis or septal hematoma.     Mouth/Throat:     Mouth: Mucous membranes are moist.     Pharynx: Oropharynx is clear.  Eyes:     General: No scleral icterus.       Right eye: No discharge.        Left eye: No discharge.     Extraocular Movements: Extraocular movements intact.     Conjunctiva/sclera:  Conjunctivae normal.     Pupils: Pupils are equal, round, and reactive to light.  Neck:     Vascular: No JVD.     Comments: Wearing cervical collar, unable to assess ROM Cardiovascular:     Rate and Rhythm: Normal rate and regular rhythm.     Pulses: Normal pulses.          Radial  pulses are 2+ on the right side and 2+ on the left side.       Dorsalis pedis pulses are 2+ on the right side and 2+ on the left side.     Heart sounds: Normal heart sounds.  Pulmonary:     Comments: Lungs clear to auscultation in all fields. Symmetric chest rise. No wheezing, rales, or rhonchi. Chest:     Chest wall: No tenderness.     Comments: No anterior chest wall tenderness.  No deformity or crepitus noted.  No evidence of flail chest.  Abdominal:     Comments: Abdomen is soft, non-distended, and non-tender in all quadrants. No rigidity, no guarding. No peritoneal signs.  Musculoskeletal:        General: Normal range of motion.     Right hip: Normal.     Right knee: Normal.     Right ankle: Normal.     Comments: Patient palpated from head to toe without any bony tenderness.   Full range of motion of the T-spine and L-spine No tenderness to palpation of the spinous processes of the T-spine or L-spine No crepitus, deformity or step-offs No tenderness to palpation of the paraspinous muscles of the L-spine     Skin:    General: Skin is warm and dry.     Capillary Refill: Capillary refill takes less than 2 seconds.  Neurological:     Mental Status: He is oriented to person, place, and time.     GCS: GCS eye subscore is 4. GCS verbal subscore is 5. GCS motor subscore is 6.     Comments: Fluent speech, no facial droop.  Psychiatric:        Behavior: Behavior normal.     ED Results / Procedures / Treatments   Labs (all labs ordered are listed, but only abnormal results are displayed) Labs Reviewed  CBG MONITORING, ED    EKG None  Radiology CT HEAD WO CONTRAST  Result Date:  01/25/2020 CLINICAL DATA:  Larey Seat, right hip and neck pain EXAM: CT HEAD WITHOUT CONTRAST CT CERVICAL SPINE WITHOUT CONTRAST TECHNIQUE: Multidetector CT imaging of the head and cervical spine was performed following the standard protocol without intravenous contrast. Multiplanar CT image reconstructions of the cervical spine were also generated. COMPARISON:  10/28/2019 FINDINGS: CT HEAD FINDINGS Brain: Stable hypodensities of the periventricular white matter consistent with chronic small vessel ischemic change. No acute infarct or hemorrhage. Lateral ventricles and remaining midline structures are unremarkable. No acute extra-axial fluid collections. No mass effect. Vascular: No hyperdense vessel or unexpected calcification. Skull: Normal. Negative for fracture or focal lesion. Sinuses/Orbits: No acute finding. Other: None. CT CERVICAL SPINE FINDINGS Alignment: Alignment is anatomic. Skull base and vertebrae: No acute fracture. No primary bone lesion or focal pathologic process. Soft tissues and spinal canal: No prevertebral fluid or swelling. No visible canal hematoma. Disc levels: Diffuse facet hypertrophic changes. Mild to moderate spondylosis at C5-6. Symmetrical neural foraminal narrowing at C3-4 primarily due to facet hypertrophy. Upper chest: Airway is patent.  Lung apices are clear. Other: Reconstructed images demonstrate no additional findings. IMPRESSION: 1. Stable head CT, no acute process. 2. No acute cervical spine fracture. Multilevel cervical degenerative changes. Electronically Signed   By: Sharlet Salina M.D.   On: 01/25/2020 17:42   CT Cervical Spine Wo Contrast  Result Date: 01/25/2020 CLINICAL DATA:  Larey Seat, right hip and neck pain EXAM: CT HEAD WITHOUT CONTRAST CT CERVICAL SPINE WITHOUT CONTRAST TECHNIQUE: Multidetector CT imaging of the head and  cervical spine was performed following the standard protocol without intravenous contrast. Multiplanar CT image reconstructions of the cervical spine  were also generated. COMPARISON:  10/28/2019 FINDINGS: CT HEAD FINDINGS Brain: Stable hypodensities of the periventricular white matter consistent with chronic small vessel ischemic change. No acute infarct or hemorrhage. Lateral ventricles and remaining midline structures are unremarkable. No acute extra-axial fluid collections. No mass effect. Vascular: No hyperdense vessel or unexpected calcification. Skull: Normal. Negative for fracture or focal lesion. Sinuses/Orbits: No acute finding. Other: None. CT CERVICAL SPINE FINDINGS Alignment: Alignment is anatomic. Skull base and vertebrae: No acute fracture. No primary bone lesion or focal pathologic process. Soft tissues and spinal canal: No prevertebral fluid or swelling. No visible canal hematoma. Disc levels: Diffuse facet hypertrophic changes. Mild to moderate spondylosis at C5-6. Symmetrical neural foraminal narrowing at C3-4 primarily due to facet hypertrophy. Upper chest: Airway is patent.  Lung apices are clear. Other: Reconstructed images demonstrate no additional findings. IMPRESSION: 1. Stable head CT, no acute process. 2. No acute cervical spine fracture. Multilevel cervical degenerative changes. Electronically Signed   By: Sharlet SalinaMichael  Brown M.D.   On: 01/25/2020 17:42   DG HIP UNILAT WITH PELVIS 2-3 VIEWS RIGHT  Result Date: 01/25/2020 CLINICAL DATA:  Larey SeatFell, right hip pain EXAM: DG HIP (WITH OR WITHOUT PELVIS) 2-3V RIGHT COMPARISON:  None. FINDINGS: Frontal view of the pelvis as well as frontal and frogleg lateral views of the right hip are obtained. No fracture, subluxation, or dislocation. Mild symmetrical bilateral hip osteoarthritis. Sacroiliac joints are normal. IMPRESSION: 1. Mild osteoarthritis.  No acute fracture. Electronically Signed   By: Sharlet SalinaMichael  Brown M.D.   On: 01/25/2020 15:09    Procedures Procedures (including critical care time)  Medications Ordered in ED Medications - No data to display  ED Course  I have reviewed the  triage vital signs and the nursing notes.  Pertinent labs & imaging results that were available during my care of the patient were reviewed by me and considered in my medical decision making (see chart for details).    MDM Rules/Calculators/A&P                           History provided by patient with additional history obtained from chart review.    85 year old male presenting after mechanical fall.  Afebrile, is hemodynamically stable.  On exam he has no signs of serious head or neck injury, cervical collar was applied by EMS due to his complaining of neck pain.  He is very well appearing and is in no acute distress.  He is ambulatory to the bathroom multiple times without difficulty.  No deformity of right hip or lower extremity. X-ray of left hip viewed by me without fracture dislocation.  I also reviewed his CT head and cervical spine which showed no acute abnormalities.  Discussed results with patient.  He is eager to be discharged home.  He declines need for analgesics.  The patient appears reasonably screened and/or stabilized for discharge and I doubt any other medical condition or other [facility] requiring further screening, evaluation, or treatment in the ED at this time prior to discharge. The patient is safe for discharge with strict return precautions discussed. Recommend pcp follow up if pain persists.   Portions of this note were generated with Scientist, clinical (histocompatibility and immunogenetics)Dragon dictation software. Dictation errors may occur despite best attempts at proofreading.   Final Clinical Impression(s) / ED Diagnoses Final diagnoses:  Fall, initial encounter  Rx / DC Orders ED Discharge Orders    None       Kandice Hams 01/25/20 1812    Maia Plan, MD 01/26/20 1246

## 2020-01-25 NOTE — Discharge Instructions (Addendum)
The head CT and neck do not show any signs of bleeding or serious injury.  The xray of pelvis do not show any broken bones or a dislocation.  Follow up with primary care doctor if you continue to have neck pain.  Take tylenol as needed for pain at home. Take as directed on the bottle.   Thank you for allowing [place] to care for you today.

## 2020-02-15 ENCOUNTER — Other Ambulatory Visit (HOSPITAL_COMMUNITY): Payer: Self-pay

## 2020-02-15 DIAGNOSIS — I82890 Acute embolism and thrombosis of other specified veins: Secondary | ICD-10-CM

## 2020-02-18 ENCOUNTER — Inpatient Hospital Stay (HOSPITAL_COMMUNITY): Payer: Medicare Other | Attending: Hematology and Oncology

## 2020-02-18 ENCOUNTER — Ambulatory Visit (HOSPITAL_COMMUNITY)
Admission: RE | Admit: 2020-02-18 | Discharge: 2020-02-18 | Disposition: A | Discharge status: Home / Self Care | Payer: Medicare Other | Source: Ambulatory Visit | Attending: Nurse Practitioner | Admitting: Nurse Practitioner

## 2020-02-18 ENCOUNTER — Other Ambulatory Visit: Payer: Self-pay

## 2020-02-18 DIAGNOSIS — I82890 Acute embolism and thrombosis of other specified veins: Secondary | ICD-10-CM | POA: Diagnosis not present

## 2020-02-18 DIAGNOSIS — R1033 Periumbilical pain: Secondary | ICD-10-CM | POA: Diagnosis present

## 2020-02-18 DIAGNOSIS — Z7901 Long term (current) use of anticoagulants: Secondary | ICD-10-CM | POA: Diagnosis not present

## 2020-02-18 DIAGNOSIS — D696 Thrombocytopenia, unspecified: Secondary | ICD-10-CM | POA: Insufficient documentation

## 2020-02-18 LAB — CBC WITH DIFFERENTIAL/PLATELET
Abs Immature Granulocytes: 0.01 10*3/uL (ref 0.00–0.07)
Basophils Absolute: 0.1 10*3/uL (ref 0.0–0.1)
Basophils Relative: 1 %
Eosinophils Absolute: 0.2 10*3/uL (ref 0.0–0.5)
Eosinophils Relative: 3 %
HCT: 44.6 % (ref 39.0–52.0)
Hemoglobin: 14.8 g/dL (ref 13.0–17.0)
Immature Granulocytes: 0 %
Lymphocytes Relative: 17 %
Lymphs Abs: 1 10*3/uL (ref 0.7–4.0)
MCH: 31.2 pg (ref 26.0–34.0)
MCHC: 33.2 g/dL (ref 30.0–36.0)
MCV: 94.1 fL (ref 80.0–100.0)
Monocytes Absolute: 0.5 10*3/uL (ref 0.1–1.0)
Monocytes Relative: 9 %
Neutro Abs: 4.2 10*3/uL (ref 1.7–7.7)
Neutrophils Relative %: 70 %
Platelets: 115 10*3/uL — ABNORMAL LOW (ref 150–400)
RBC: 4.74 MIL/uL (ref 4.22–5.81)
RDW: 13.3 % (ref 11.5–15.5)
WBC: 5.9 10*3/uL (ref 4.0–10.5)
nRBC: 0 % (ref 0.0–0.2)

## 2020-02-18 LAB — COMPREHENSIVE METABOLIC PANEL
ALT: 11 U/L (ref 0–44)
AST: 20 U/L (ref 15–41)
Albumin: 4.5 g/dL (ref 3.5–5.0)
Alkaline Phosphatase: 87 U/L (ref 38–126)
Anion gap: 8 (ref 5–15)
BUN: 16 mg/dL (ref 8–23)
CO2: 26 mmol/L (ref 22–32)
Calcium: 9.6 mg/dL (ref 8.9–10.3)
Chloride: 99 mmol/L (ref 98–111)
Creatinine, Ser: 1.31 mg/dL — ABNORMAL HIGH (ref 0.61–1.24)
GFR, Estimated: 54 mL/min — ABNORMAL LOW (ref >60)
Glucose, Bld: 107 mg/dL — ABNORMAL HIGH (ref 70–99)
Potassium: 4.5 mmol/L (ref 3.5–5.1)
Sodium: 133 mmol/L — ABNORMAL LOW (ref 135–145)
Total Bilirubin: 0.6 mg/dL (ref 0.3–1.2)
Total Protein: 7.6 g/dL (ref 6.5–8.1)

## 2020-02-18 LAB — D-DIMER, QUANTITATIVE: D-Dimer, Quant: 0.3 ug/mL-FEU (ref 0.00–0.50)

## 2020-02-18 MED ORDER — IOHEXOL 300 MG/ML  SOLN
100.0000 mL | Freq: Once | INTRAMUSCULAR | Status: AC | PRN
  Administered 2020-02-18: 100 mL via INTRAVENOUS

## 2020-02-20 LAB — BETA-2-GLYCOPROTEIN I ABS, IGG/M/A
Beta-2 Glyco I IgG: <9 GPI IgG units (ref 0–20)
Beta-2-Glycoprotein I IgA: <9 GPI IgA units (ref 0–25)
Beta-2-Glycoprotein I IgM: <9 GPI IgM units (ref 0–32)

## 2020-02-21 ENCOUNTER — Encounter (HOSPITAL_COMMUNITY): Payer: Self-pay | Admitting: Hematology and Oncology

## 2020-02-21 ENCOUNTER — Other Ambulatory Visit: Payer: Self-pay

## 2020-02-21 ENCOUNTER — Inpatient Hospital Stay (HOSPITAL_BASED_OUTPATIENT_CLINIC_OR_DEPARTMENT_OTHER): Payer: Medicare Other | Admitting: Hematology and Oncology

## 2020-02-21 DIAGNOSIS — I82890 Acute embolism and thrombosis of other specified veins: Secondary | ICD-10-CM

## 2020-02-21 NOTE — Progress Notes (Signed)
Patient Care Team: [name], MD as PCP - General (Family Medicine)  DIAGNOSIS:  Encounter Diagnosis  Name Primary?  . Splenic vein thrombosis      CHIEF COMPLIANT: Follow-up of splenic vein thrombosis  INTERVAL HISTORY: Jon Bennett is a 85 year old with a history of splenic vein thrombosis who is currently on oral anticoagulation.  Is tolerating Eliquis extremely well without any problems or concerns.  Denies any bleeding problems.  ALLERGIES:  is allergic to asa [aspirin].  MEDICATIONS:  Current Outpatient Medications  Medication Sig Dispense Refill  . apixaban (ELIQUIS) 5 MG TABS tablet Take 1 tablet (5 mg total) by mouth 2 (two) times daily. 60 tablet 2  . baclofen (LIORESAL) 10 MG tablet Take 1 tablet (10 mg total) by mouth daily as needed for muscle spasms. 30 each 0  . busPIRone (BUSPAR) 10 MG tablet Take 10 mg by mouth 2 (two) times daily.    Marland Kitchen doxepin (SINEQUAN) 25 MG capsule Take 25 mg by mouth at bedtime.    . famotidine (PEPCID) 20 MG tablet Take 1 tablet (20 mg total) by mouth at bedtime.    . metoprolol tartrate (LOPRESSOR) 25 MG tablet Take 1 tablet (25 mg total) by mouth 2 (two) times daily. 60 tablet 1  . Multiple Vitamins-Minerals (MULTIVITAMIN WITH MINERALS) tablet Take 1 tablet by mouth daily. 30 tablet 3  . ondansetron (ZOFRAN ODT) 8 MG disintegrating tablet Take 1 tablet (8 mg total) by mouth every 8 (eight) hours as needed for nausea or vomiting. 20 tablet 0  . pantoprazole (PROTONIX) 40 MG tablet Take 1 tablet (40 mg total) by mouth daily. 30 tablet 1  . sertraline (ZOLOFT) 100 MG tablet Take 100 mg by mouth daily.    . vitamin B-12 (CYANOCOBALAMIN) 1000 MCG tablet Take 1 tablet (1,000 mcg total) by mouth daily. 30 tablet 2  . Vitamin D, Ergocalciferol, (DRISDOL) 1.25 MG (50000 UNIT) CAPS capsule Take 50,000 Units by mouth every 7 (seven) days.    Marland Kitchen acetaminophen (TYLENOL) 500 MG tablet Take 500 mg by mouth daily as needed for mild pain or moderate pain.  (Patient not taking: Reported on 02/21/2020)     No current facility-administered medications for this visit.    PHYSICAL EXAMINATION: ECOG PERFORMANCE STATUS: 1 - Symptomatic but completely ambulatory  Vitals:   02/21/20 1426  BP: 139/77  Pulse: 87  Resp: 18  Temp: (!) 97.5 F (36.4 C)  SpO2: 100%   Filed Weights   02/21/20 1426  Weight: 163 lb (73.9 kg)      LABORATORY DATA:  I have reviewed the data as listed CMP Latest Ref Rng & Units 02/18/2020 10/29/2019 10/28/2019  Glucose 70 - 99 mg/dL 517(G) 93 017(C)  BUN 8 - 23 mg/dL 16 13 14   Creatinine 0.61 - 1.24 mg/dL ) 9.44(H 6.75  Sodium 135 - 145 mmol/L 133(L) 137 136  Potassium 3.5 - 5.1 mmol/L 4.5 4.6 3.9  Chloride 98 - 111 mmol/L 99 107 103  CO2 22 - 32 mmol/L 26 23 23   Calcium 8.9 - 10.3 mg/dL 9.6 9.16) 9.1  Total Protein 6.5 - 8.1 g/dL 7.6 6.0(L) 7.0  Total Bilirubin 0.3 - 1.2 mg/dL 0.6 0.8 1.0  Alkaline Phos 38 - 126 U/L 87 44 55  AST 15 - 41 U/L 20 22 25   ALT 0 - 44 U/L 11 11 13     Lab Results  Component Value Date   WBC 5.9 02/18/2020   HGB 14.8 02/18/2020   HCT 44.6  02/18/2020   MCV 94.1 02/18/2020   PLT 115 (L) 02/18/2020   NEUTROABS 4.2 02/18/2020    ASSESSMENT & PLAN:  Splenic vein thrombosis -Presentation to the ER on 10/28/2019 with epigastric abdominal pain. -Patient had a history of alcohol abuse.  Currently drinks 1 shot daily. -CT AP on 10/28/2019 showed mildly prominent spleen with perisplenic varices secondary to thrombosis of the main splenic vein.  Pancreatic atrophy without inflammatory change to suggest pancreatitis. -He was started on Eliquis after discontinuing Plavix. -He is tolerating Eliquis without any bleeding issues.  No abdominal pain reported. 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 -No family history of clotting disorders or malignancies. -Inpatient work-up including lupus anticoagulant, anticardiolipin antibody, prothrombin  gene mutation and factor V Leiden mutation was negative. 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  Atrophy of pancreatic parenchyma -D-dimer 0.3 antiglycoprotein 1 antibodies: Negative  Recommendation: Indefinite lifetime anticoagulation Return to clinic in 6 months for follow-up    No orders of the defined types were placed in this encounter.  The patient has a good understanding of the overall plan. he agrees with it. he will call with any problems that may develop before the next visit here. Total time spent: 30 mins including face to face time and time spent for planning, charting and co-ordination of care   Tamsen Meek, MD 02/21/20

## 2020-02-21 NOTE — Assessment & Plan Note (Addendum)
-  Presentation to the ER on 10/28/1999 with epigastric abdominal pain. -Patient had a history of alcohol abuse.  Currently drinks 1 shot daily. -CT AP on 10/28/2019 showed mildly prominent spleen with perisplenic varices secondary to thrombosis of the main splenic vein.  Pancreatic atrophy without inflammatory change to suggest pancreatitis. -He was started on Eliquis after discontinuing Plavix. -He is tolerating Eliquis without any bleeding issues.  No abdominal pain reported. 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 -No family history of clotting disorders or malignancies. -Inpatient work-up including lupus anticoagulant, anticardiolipin antibody, prothrombin gene mutation and factor V Leiden mutation was negative. 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  Atrophy of pancreatic parenchyma -D-dimer 0.3 antiglycoprotein 1 antibodies: Negative  Recommendation: Indefinite lifetime anticoagulation Return to clinic in 6 months for follow-up

## 2020-03-23 ENCOUNTER — Encounter (HOSPITAL_COMMUNITY): Payer: Self-pay

## 2020-03-23 ENCOUNTER — Other Ambulatory Visit (HOSPITAL_COMMUNITY): Payer: Self-pay | Admitting: Hematology

## 2020-03-24 ENCOUNTER — Other Ambulatory Visit (HOSPITAL_COMMUNITY): Payer: Self-pay | Admitting: *Deleted

## 2020-03-24 MED ORDER — APIXABAN 5 MG PO TABS: 5.0000 mg | ORAL_TABLET | Freq: Two times a day (BID) | ORAL | 2 refills | Status: DC

## 2020-03-27 ENCOUNTER — Other Ambulatory Visit (HOSPITAL_COMMUNITY): Payer: Self-pay | Admitting: *Deleted

## 2020-03-27 DIAGNOSIS — I82890 Acute embolism and thrombosis of other specified veins: Secondary | ICD-10-CM

## 2020-04-21 ENCOUNTER — Emergency Department (HOSPITAL_COMMUNITY): Payer: Medicare Other

## 2020-04-21 ENCOUNTER — Other Ambulatory Visit: Payer: Self-pay

## 2020-04-21 ENCOUNTER — Encounter (HOSPITAL_COMMUNITY): Payer: Self-pay

## 2020-04-21 ENCOUNTER — Emergency Department (HOSPITAL_COMMUNITY)
Admission: EM | Admit: 2020-04-21 | Discharge: 2020-04-22 | Disposition: A | Discharge status: Home / Self Care | Payer: Medicare Other | Attending: Emergency Medicine | Admitting: Emergency Medicine

## 2020-04-21 DIAGNOSIS — S50312A Abrasion of left elbow, initial encounter: Secondary | ICD-10-CM | POA: Diagnosis not present

## 2020-04-21 DIAGNOSIS — N178 Other acute kidney failure: Secondary | ICD-10-CM | POA: Diagnosis not present

## 2020-04-21 DIAGNOSIS — Z7901 Long term (current) use of anticoagulants: Secondary | ICD-10-CM | POA: Diagnosis not present

## 2020-04-21 DIAGNOSIS — K449 Diaphragmatic hernia without obstruction or gangrene: Secondary | ICD-10-CM | POA: Diagnosis not present

## 2020-04-21 DIAGNOSIS — S0990XA Unspecified injury of head, initial encounter: Secondary | ICD-10-CM | POA: Diagnosis not present

## 2020-04-21 DIAGNOSIS — I10 Essential (primary) hypertension: Secondary | ICD-10-CM | POA: Insufficient documentation

## 2020-04-21 DIAGNOSIS — S80811A Abrasion, right lower leg, initial encounter: Secondary | ICD-10-CM | POA: Diagnosis not present

## 2020-04-21 DIAGNOSIS — S2242XA Multiple fractures of ribs, left side, initial encounter for closed fracture: Secondary | ICD-10-CM | POA: Diagnosis not present

## 2020-04-21 DIAGNOSIS — E119 Type 2 diabetes mellitus without complications: Secondary | ICD-10-CM | POA: Insufficient documentation

## 2020-04-21 DIAGNOSIS — Z79899 Other long term (current) drug therapy: Secondary | ICD-10-CM | POA: Insufficient documentation

## 2020-04-21 DIAGNOSIS — Z20822 Contact with and (suspected) exposure to covid-19: Secondary | ICD-10-CM | POA: Insufficient documentation

## 2020-04-21 DIAGNOSIS — N289 Disorder of kidney and ureter, unspecified: Secondary | ICD-10-CM | POA: Diagnosis not present

## 2020-04-21 DIAGNOSIS — S51012A Laceration without foreign body of left elbow, initial encounter: Secondary | ICD-10-CM | POA: Insufficient documentation

## 2020-04-21 DIAGNOSIS — S2249XA Multiple fractures of ribs, unspecified side, initial encounter for closed fracture: Secondary | ICD-10-CM | POA: Diagnosis not present

## 2020-04-21 DIAGNOSIS — E871 Hypo-osmolality and hyponatremia: Secondary | ICD-10-CM

## 2020-04-21 DIAGNOSIS — S2243XA Multiple fractures of ribs, bilateral, initial encounter for closed fracture: Secondary | ICD-10-CM | POA: Diagnosis not present

## 2020-04-21 DIAGNOSIS — R0782 Intercostal pain: Secondary | ICD-10-CM | POA: Diagnosis present

## 2020-04-21 DIAGNOSIS — Z041 Encounter for examination and observation following transport accident: Secondary | ICD-10-CM | POA: Diagnosis not present

## 2020-04-21 LAB — COMPREHENSIVE METABOLIC PANEL
ALT: 14 U/L (ref 0–44)
AST: 28 U/L (ref 15–41)
Albumin: 4.2 g/dL (ref 3.5–5.0)
Alkaline Phosphatase: 55 U/L (ref 38–126)
Anion gap: 6 (ref 5–15)
BUN: 15 mg/dL (ref 8–23)
CO2: 23 mmol/L (ref 22–32)
Calcium: 9.1 mg/dL (ref 8.9–10.3)
Chloride: 105 mmol/L (ref 98–111)
Creatinine, Ser: 1.33 mg/dL — ABNORMAL HIGH (ref 0.61–1.24)
GFR, Estimated: 53 mL/min — ABNORMAL LOW (ref >60)
Glucose, Bld: 112 mg/dL — ABNORMAL HIGH (ref 70–99)
Potassium: 4 mmol/L (ref 3.5–5.1)
Sodium: 134 mmol/L — ABNORMAL LOW (ref 135–145)
Total Bilirubin: 0.9 mg/dL (ref 0.3–1.2)
Total Protein: 6.9 g/dL (ref 6.5–8.1)

## 2020-04-21 LAB — CBC
HCT: 40.9 % (ref 39.0–52.0)
Hemoglobin: 13.9 g/dL (ref 13.0–17.0)
MCH: 30.8 pg (ref 26.0–34.0)
MCHC: 34 g/dL (ref 30.0–36.0)
MCV: 90.7 fL (ref 80.0–100.0)
Platelets: 116 10*3/uL — ABNORMAL LOW (ref 150–400)
RBC: 4.51 MIL/uL (ref 4.22–5.81)
RDW: 13.9 % (ref 11.5–15.5)
WBC: 10.8 10*3/uL — ABNORMAL HIGH (ref 4.0–10.5)
nRBC: 0 % (ref 0.0–0.2)

## 2020-04-21 LAB — SAMPLE TO BLOOD BANK

## 2020-04-21 LAB — PROTIME-INR
INR: 1.2 (ref 0.8–1.2)
Prothrombin Time: 14.3 seconds (ref 11.4–15.2)

## 2020-04-21 LAB — TROPONIN I (HIGH SENSITIVITY): Troponin I (High Sensitivity): 10 ng/L (ref <18)

## 2020-04-21 LAB — LACTIC ACID, PLASMA: Lactic Acid, Venous: 1.3 mmol/L (ref 0.5–1.9)

## 2020-04-21 LAB — ETHANOL: Alcohol, Ethyl (B): 63 mg/dL — ABNORMAL HIGH (ref <10)

## 2020-04-21 MED ORDER — IOHEXOL 300 MG/ML  SOLN
100.0000 mL | Freq: Once | INTRAMUSCULAR | Status: AC | PRN
  Administered 2020-04-21: 100 mL via INTRAVENOUS
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
## 2020-04-21 NOTE — ED Notes (Signed)
Pt ambulatory to bathroom with no assistance needed at this time.
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
## 2020-04-22 LAB — SARS CORONAVIRUS 2 BY RT PCR (HOSPITAL ORDER, PERFORMED IN ~~LOC~~ HOSPITAL LAB): SARS Coronavirus 2: NEGATIVE

## 2020-04-22 LAB — TROPONIN I (HIGH SENSITIVITY): Troponin I (High Sensitivity): 11 ng/L (ref <18)

## 2020-04-22 MED ORDER — HYDROCODONE-ACETAMINOPHEN 5-325 MG PO TABS
1.0000 | ORAL_TABLET | Freq: Once | ORAL | Status: AC
Start: 2020-04-22 — End: 2020-04-22
  Administered 2020-04-22: 1 via ORAL
  Filled 2020-04-22: qty 1

## 2020-04-22 MED ORDER — HYDROCODONE-ACETAMINOPHEN 5-325 MG PO TABS: 1.0000 | ORAL_TABLET | ORAL | 0 refills | Status: DC | PRN

## 2020-04-22 MED ORDER — HYDROCODONE-ACETAMINOPHEN 5-325 MG PO TABS: 2.0000 | ORAL_TABLET | ORAL | 0 refills | Status: DC | PRN

## 2020-04-22 NOTE — Discharge Instructions (Signed)
Apply ice for 30 minutes at a time, 4 times a day.  Take acetaminophen for less severe pain, hydrocodone-acetaminophen for more severe pain.  Because you are taking a blood thinner, you should not take ibuprofen or naproxen.

## 2020-04-23 MED FILL — Hydrocodone-Acetaminophen Tab 5-325 MG: ORAL | Qty: 6 | Status: AC

## 2020-04-23 MED FILL — Hydrocodone-Acetaminophen Tab 5-325 MG: ORAL | Qty: 6 | Status: CN

## 2020-04-28 ENCOUNTER — Ambulatory Visit: Payer: Medicare Other | Admitting: Family Medicine

## 2020-05-09 DIAGNOSIS — H5203 Hypermetropia, bilateral: Secondary | ICD-10-CM | POA: Diagnosis not present

## 2020-05-09 DIAGNOSIS — H2513 Age-related nuclear cataract, bilateral: Secondary | ICD-10-CM | POA: Diagnosis not present

## 2020-05-09 DIAGNOSIS — H524 Presbyopia: Secondary | ICD-10-CM | POA: Diagnosis not present

## 2020-05-09 DIAGNOSIS — H52223 Regular astigmatism, bilateral: Secondary | ICD-10-CM | POA: Diagnosis not present

## 2020-06-17 DIAGNOSIS — Z0000 Encounter for general adult medical examination without abnormal findings: Secondary | ICD-10-CM | POA: Diagnosis not present

## 2020-06-26 DIAGNOSIS — H25043 Posterior subcapsular polar age-related cataract, bilateral: Secondary | ICD-10-CM | POA: Diagnosis not present

## 2020-06-26 DIAGNOSIS — H353131 Nonexudative age-related macular degeneration, bilateral, early dry stage: Secondary | ICD-10-CM | POA: Diagnosis not present

## 2020-06-26 DIAGNOSIS — H18413 Arcus senilis, bilateral: Secondary | ICD-10-CM | POA: Diagnosis not present

## 2020-06-26 DIAGNOSIS — H2513 Age-related nuclear cataract, bilateral: Secondary | ICD-10-CM | POA: Diagnosis not present

## 2020-06-27 ENCOUNTER — Other Ambulatory Visit (HOSPITAL_COMMUNITY): Payer: Self-pay | Admitting: Hematology

## 2020-08-06 ENCOUNTER — Encounter (HOSPITAL_COMMUNITY): Payer: Self-pay | Admitting: Emergency Medicine

## 2020-08-06 ENCOUNTER — Emergency Department (HOSPITAL_COMMUNITY): Payer: Medicare Other

## 2020-08-06 ENCOUNTER — Emergency Department (HOSPITAL_COMMUNITY)
Admission: EM | Admit: 2020-08-06 | Discharge: 2020-08-06 | Disposition: A | Discharge status: Home / Self Care | Payer: Medicare Other | Attending: Emergency Medicine | Admitting: Emergency Medicine

## 2020-08-06 ENCOUNTER — Other Ambulatory Visit: Payer: Self-pay

## 2020-08-06 DIAGNOSIS — R1110 Vomiting, unspecified: Secondary | ICD-10-CM | POA: Diagnosis not present

## 2020-08-06 DIAGNOSIS — Z7901 Long term (current) use of anticoagulants: Secondary | ICD-10-CM | POA: Insufficient documentation

## 2020-08-06 DIAGNOSIS — F1010 Alcohol abuse, uncomplicated: Secondary | ICD-10-CM | POA: Diagnosis present

## 2020-08-06 DIAGNOSIS — E119 Type 2 diabetes mellitus without complications: Secondary | ICD-10-CM | POA: Insufficient documentation

## 2020-08-06 DIAGNOSIS — I10 Essential (primary) hypertension: Secondary | ICD-10-CM | POA: Diagnosis not present

## 2020-08-06 DIAGNOSIS — Y900 Blood alcohol level of less than 20 mg/100 ml: Secondary | ICD-10-CM | POA: Diagnosis not present

## 2020-08-06 DIAGNOSIS — Z79899 Other long term (current) drug therapy: Secondary | ICD-10-CM | POA: Insufficient documentation

## 2020-08-06 DIAGNOSIS — R42 Dizziness and giddiness: Secondary | ICD-10-CM | POA: Diagnosis not present

## 2020-08-06 LAB — COMPREHENSIVE METABOLIC PANEL
ALT: 16 U/L (ref 0–44)
AST: 30 U/L (ref 15–41)
Albumin: 3.8 g/dL (ref 3.5–5.0)
Alkaline Phosphatase: 95 U/L (ref 38–126)
Anion gap: 10 (ref 5–15)
BUN: 14 mg/dL (ref 8–23)
CO2: 23 mmol/L (ref 22–32)
Calcium: 9.5 mg/dL (ref 8.9–10.3)
Chloride: 104 mmol/L (ref 98–111)
Creatinine, Ser: 0.85 mg/dL (ref 0.61–1.24)
GFR, Estimated: >60 mL/min (ref >60)
Glucose, Bld: 137 mg/dL — ABNORMAL HIGH (ref 70–99)
Potassium: 3.9 mmol/L (ref 3.5–5.1)
Sodium: 137 mmol/L (ref 135–145)
Total Bilirubin: 1.1 mg/dL (ref 0.3–1.2)
Total Protein: 6.9 g/dL (ref 6.5–8.1)

## 2020-08-06 LAB — CBC
HCT: 44 % (ref 39.0–52.0)
Hemoglobin: 14.9 g/dL (ref 13.0–17.0)
MCH: 32.6 pg (ref 26.0–34.0)
MCHC: 33.9 g/dL (ref 30.0–36.0)
MCV: 96.3 fL (ref 80.0–100.0)
Platelets: 173 10*3/uL (ref 150–400)
RBC: 4.57 MIL/uL (ref 4.22–5.81)
RDW: 14.5 % (ref 11.5–15.5)
WBC: 9.9 10*3/uL (ref 4.0–10.5)
nRBC: 0 % (ref 0.0–0.2)

## 2020-08-06 LAB — ACETAMINOPHEN LEVEL: Acetaminophen (Tylenol), Serum: <10 ug/mL — ABNORMAL LOW (ref 10–30)

## 2020-08-06 LAB — ETHANOL: Alcohol, Ethyl (B): <10 mg/dL (ref <10)

## 2020-08-06 LAB — SALICYLATE LEVEL: Salicylate Lvl: <7 mg/dL — ABNORMAL LOW (ref 7.0–30.0)

## 2020-08-06 LAB — LIPASE, BLOOD: Lipase: 100 U/L — ABNORMAL HIGH (ref 11–51)

## 2020-08-06 MED ORDER — ONDANSETRON HCL 4 MG PO TABS: 4.0000 mg | ORAL_TABLET | Freq: Three times a day (TID) | ORAL | 0 refills | Status: DC | PRN

## 2020-08-06 MED ORDER — METOPROLOL TARTRATE 50 MG PO TABS: 50.0000 mg | ORAL_TABLET | Freq: Once | ORAL | Status: DC

## 2020-08-06 MED ORDER — SODIUM CHLORIDE 0.9 % IV BOLUS
500.0000 mL | Freq: Once | INTRAVENOUS | Status: AC
  Administered 2020-08-06: 500 mL via INTRAVENOUS

## 2020-08-06 MED ORDER — ONDANSETRON HCL 4 MG/2ML IJ SOLN
4.0000 mg | Freq: Once | INTRAMUSCULAR | Status: AC
  Administered 2020-08-06: 4 mg via INTRAVENOUS
  Filled 2020-08-06: qty 2

## 2020-08-06 NOTE — ED Notes (Signed)
Patient's discharged discussed with patient and son, [name].

## 2020-08-06 NOTE — ED Triage Notes (Signed)
Pt son reports patient lives with him. Pt typically has 2-3 "hard drinks" a day. Pt has not had a drink in about 36 hours. Pt c/o n/v/d. Son also reports patient is confused-unaware of how many children he has, thinks he is dying. Pt is typically alert and oriented at baseline.

## 2020-08-06 NOTE — ED Notes (Signed)
Patient updated on discharged status. Attempted to call patient son, [name], without answer. VM left.

## 2020-08-06 NOTE — ED Provider Notes (Signed)
[facility] EMERGENCY DEPARTMENT Provider Note   CSN: [id number] Arrival date & time: 08/06/20  1006     History Chief Complaint  Patient presents with  . Delirium Tremens (DTS)    Jon Bennett is a 86 y.o. male.  Patient brought in by his family chief concern for alcohol withdrawal.  They state that he typically drinks a lot nearly every day but his last drink was 36 hours ago.  They state that the patient had been more shaky at home and did not remember exactly how many children that he had.  Otherwise no reports of fall or other trauma.  Patient did have some episodes of vomiting at home nonbloody.  Patient at this time denies any headache or chest pain or abdominal pain, rather states that he feels fine.        Past Medical History:  Diagnosis Date  . Diabetes mellitus without complication [facility])    Patient is not on any medication for this  . GERD (gastroesophageal reflux disease)   . Hypertension   . Pancreatitis, alcoholic, acute   . Splenic vein thrombosis     Patient Active Problem List   Diagnosis Date Noted  . 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 Dyskinesia   . Absolute anemia   . Akathisia 06/29/2019  . Nausea and vomiting 06/29/2019  . Acathisia 06/29/2019  . Acute hyperactive alcohol withdrawal delirium (HCC) 04/24/2019  . Type 2 diabetes mellitus (HCC)   . Hypertension   . GERD (gastroesophageal reflux disease)   . Thrombocytopenia (HCC) 08/16/2018  . Alcohol withdrawal (HCC) 08/13/2018    Past Surgical History:  Procedure Laterality Date  . APPENDECTOMY    . VASCULAR SURGERY         Family History  Problem Relation Age of Onset  . Hypertension Other   . Heart disease Father   . Healthy Sister   . Healthy Brother   . Cancer Maternal Grandfather   . Healthy Brother   . Healthy Brother   . Healthy Sister   . Healthy Sister   . Healthy Sister   . Healthy Brother   . Healthy Son   . Healthy Son   . Healthy Son   . Healthy Son      Social History   Tobacco Use  . Smoking status: Never Smoker  . Smokeless tobacco: Never Used  Vaping Use  . Vaping Use: Never used  Substance Use Topics  . Alcohol use: Yes    Alcohol/week: 1.0 standard drink    Types: 1 Shots of liquor per week  . Drug use: Never    Home Medications Prior to Admission medications   Medication Sig Start Date End Date Taking? Authorizing Provider  ondansetron (ZOFRAN) 4 MG tablet Take 1 tablet (4 mg total) by mouth every 8 (eight) hours as needed for nausea or vomiting. 08/06/20  Yes China, Eustace Moore, MD  acetaminophen (TYLENOL) 500 MG tablet Take 500 mg by mouth daily as needed for mild pain or moderate pain. Patient not taking: Reported on 02/21/2020    [provider]  baclofen (LIORESAL) 10 MG tablet Take 1 tablet (10 mg total) by mouth daily as needed for muscle spasms. 05/01/19   Sherryll Burger, Pratik D, DO  busPIRone (BUSPAR) 10 MG tablet Take 10 mg by mouth 2 (two) times daily. 08/27/19   [provider]  doxepin (SINEQUAN) 25 MG capsule Take 25 mg by mouth at bedtime. 06/04/19   [provider]  ELIQUIS 5  MG TABS tablet TAKE 1 TABLET BY MOUTH TWICE A DAY 03/24/20   Doreatha Massed, MD  ELIQUIS 5 MG TABS tablet TAKE 1 TABLET BY MOUTH TWICE A DAY 06/27/20   Doreatha Massed, MD  famotidine (PEPCID) 20 MG tablet Take 1 tablet (20 mg total) by mouth at bedtime. 06/30/19   Vassie Loll, MD  HYDROcodone-acetaminophen (NORCO) 5-325 MG tablet Take 1 tablet by mouth every 4 (four) hours as needed for moderate pain. 04/22/20   Dione Booze, MD  HYDROcodone-acetaminophen (NORCO/VICODIN) 5-325 MG tablet Take 2 tablets by mouth every 4 (four) hours as needed. 04/22/20   Dione Booze, MD  metoprolol tartrate (LOPRESSOR) 25 MG tablet Take 1 tablet (25 mg total) by mouth 2 (two) times daily. 08/18/18   Catarina Hartshorn, MD  ondansetron (ZOFRAN ODT) 8 MG disintegrating tablet Take 1 tablet (8 mg total) by mouth every 8 (eight) hours as needed for  nausea or vomiting. 06/30/19   Vassie Loll, MD  pantoprazole (PROTONIX) 40 MG tablet Take 1 tablet (40 mg total) by mouth daily. 07/01/19   Vassie Loll, MD  sertraline (ZOLOFT) 100 MG tablet Take 100 mg by mouth daily. 08/27/19   [provider]  vitamin B-12 (CYANOCOBALAMIN) 1000 MCG tablet Take 1 tablet (1,000 mcg total) by mouth daily. 06/30/19   Vassie Loll, MD  Vitamin D, Ergocalciferol, (DRISDOL) 1.25 MG (50000 UNIT) CAPS capsule Take 50,000 Units by mouth every 7 (seven) days.    [provider]    Allergies    Asa [aspirin]  Review of Systems   Review of Systems  Constitutional: Negative for fever.  HENT: Negative for ear pain and sore throat.   Eyes: Negative for pain.  Respiratory: Negative for cough.   Cardiovascular: Negative for chest pain.  Gastrointestinal: Positive for vomiting.  Genitourinary: Negative for flank pain.  Musculoskeletal: Negative for back pain.  Skin: Negative for color change and rash.  Neurological: Negative for syncope.  All other systems reviewed and are negative.   Physical Exam Updated Vital Signs BP (!) 157/76   Pulse 87   Temp 98.5 F (36.9 C)   Resp (!) 21   Ht 5\' 7"  (1.702 m)   Wt 65.8 kg   SpO2 98%   BMI 22.71 kg/m   Physical Exam Constitutional:      General: He is not in acute distress.    Appearance: He is well-developed.  HENT:     Head: Normocephalic.     Nose: Nose normal.  Eyes:     Extraocular Movements: Extraocular movements intact.  Cardiovascular:     Rate and Rhythm: Normal rate.  Pulmonary:     Effort: Pulmonary effort is normal.  Abdominal:     General: There is no distension.     Tenderness: There is no abdominal tenderness. There is no guarding.  Skin:    Coloration: Skin is not jaundiced.  Neurological:     General: No focal deficit present.     Mental Status: He is alert and oriented to person, place, and time. Mental status is at baseline.     Cranial Nerves: No cranial  nerve deficit.     Motor: No weakness.     Gait: Gait normal.     ED Results / Procedures / Treatments   Labs (all labs ordered are listed, but only abnormal results are displayed) Labs Reviewed  COMPREHENSIVE METABOLIC PANEL - Abnormal; Notable for the following components:      Result Value   Glucose, Bld  137 (*)    All other components within normal limits  SALICYLATE LEVEL - Abnormal; Notable for the following components:   Salicylate Lvl <7.0 (*)    All other components within normal limits  ACETAMINOPHEN LEVEL - Abnormal; Notable for the following components:   Acetaminophen (Tylenol), Serum <10 (*)    All other components within normal limits  LIPASE, BLOOD - Abnormal; Notable for the following components:   Lipase 100 (*)    All other components within normal limits  ETHANOL  CBC  RAPID URINE DRUG SCREEN, HOSP PERFORMED    EKG EKG Interpretation  Date/Time:  Wednesday Aug 06 2020 14:35:42 EDT Ventricular Rate:  90 PR Interval:  201 QRS Duration: 97 QT Interval:  379 QTC Calculation: 464 R Axis:   -28 Text Interpretation: Sinus rhythm Atrial premature complex Borderline left axis deviation Anteroseptal infarct, old Minimal ST depression, lateral leads Confirmed by Norman Clay ([id number]) on 08/06/2020 3:03:29 PM   Radiology CT Head Wo Contrast  Result Date: 08/06/2020 CLINICAL DATA:  Dizziness and vomiting for 1 day EXAM: CT HEAD WITHOUT CONTRAST TECHNIQUE: Contiguous axial images were obtained from the base of the skull through the vertex without intravenous contrast. COMPARISON:  04/21/2020 FINDINGS: Brain: No evidence of acute infarction, hemorrhage, hydrocephalus, extra-axial collection or mass lesion/mass effect. Chronic atrophic and ischemic changes are again identified and stable. Vascular: No hyperdense vessel or unexpected calcification. Skull: Normal. Negative for fracture or focal lesion. Sinuses/Orbits: No acute finding. Other: None. IMPRESSION: Chronic  atrophic and ischemic changes similar to that seen on prior exam. Electronically Signed   By: Alcide Clever M.D.   On: 08/06/2020 11:17    Procedures Procedures   Medications Ordered in ED Medications  metoprolol tartrate (LOPRESSOR) tablet 50 mg (has no administration in time range)  sodium chloride 0.9 % bolus 500 mL (0 mLs Intravenous Stopped 08/06/20 1527)  ondansetron (ZOFRAN) injection 4 mg (4 mg Intravenous Given 08/06/20 1437)    ED Course  I have reviewed the triage vital signs and the nursing notes.  Pertinent labs & imaging results that were available during my care of the patient were reviewed by me and considered in my medical decision making (see chart for details).    MDM Rules/Calculators/A&P                          Labs show mild lipase elevation otherwise unremarkable chemistry and CBC were normal.  CT of the head shows no acute findings consistent with age-related atrophy.  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  He appears in no pain or distress.  He has no tremors or is not diaphoretic is not tachycardic and appears comfortable.  Patient given Zofran IV fluid hydration and additional dose of metoprolol given his hypertension.  I discussed the findings with his family they are comfortable taking him home, discharged home in stable condition. Final Clinical Impression(s) / ED Diagnoses Final diagnoses:  Alcohol abuse    Rx / DC Orders ED Discharge Orders         Ordered    ondansetron (ZOFRAN) 4 MG tablet  Every 8 hours PRN        08/06/20 1529           Cheryll Cockayne, MD 08/06/20 1529

## 2020-08-06 NOTE — Discharge Instructions (Addendum)
Call your primary care doctor or specialist as discussed in the next 2-3 days.   Return immediately back to the ER if:  Your symptoms worsen within the next 12-24 hours. You develop new symptoms such as new fevers, persistent vomiting, new pain, shortness of breath, or new weakness or numbness, or if you have any other concerns.  

## 2020-08-20 ENCOUNTER — Other Ambulatory Visit (HOSPITAL_COMMUNITY): Payer: Self-pay | Admitting: *Deleted

## 2020-08-20 DIAGNOSIS — H2512 Age-related nuclear cataract, left eye: Secondary | ICD-10-CM | POA: Diagnosis not present

## 2020-08-20 DIAGNOSIS — H2511 Age-related nuclear cataract, right eye: Secondary | ICD-10-CM | POA: Diagnosis not present

## 2020-08-20 DIAGNOSIS — I82890 Acute embolism and thrombosis of other specified veins: Secondary | ICD-10-CM

## 2020-08-21 ENCOUNTER — Inpatient Hospital Stay (HOSPITAL_COMMUNITY): Payer: Medicare Other | Attending: Hematology

## 2020-08-21 DIAGNOSIS — H2511 Age-related nuclear cataract, right eye: Secondary | ICD-10-CM | POA: Diagnosis not present

## 2020-08-24 ENCOUNTER — Emergency Department (HOSPITAL_COMMUNITY): Payer: Medicare Other

## 2020-08-24 ENCOUNTER — Inpatient Hospital Stay (HOSPITAL_COMMUNITY)
Admission: EM | Admit: 2020-08-24 | Discharge: 2020-08-27 | DRG: 641 | Disposition: A | Discharge status: Skilled Nursing Facility | Payer: Medicare Other | Attending: Internal Medicine | Admitting: Internal Medicine

## 2020-08-24 ENCOUNTER — Other Ambulatory Visit: Payer: Self-pay

## 2020-08-24 ENCOUNTER — Encounter (HOSPITAL_COMMUNITY): Payer: Self-pay

## 2020-08-24 DIAGNOSIS — F10230 Alcohol dependence with withdrawal, uncomplicated: Secondary | ICD-10-CM | POA: Diagnosis not present

## 2020-08-24 DIAGNOSIS — E119 Type 2 diabetes mellitus without complications: Secondary | ICD-10-CM | POA: Diagnosis not present

## 2020-08-24 DIAGNOSIS — R2681 Unsteadiness on feet: Secondary | ICD-10-CM | POA: Diagnosis present

## 2020-08-24 DIAGNOSIS — R0689 Other abnormalities of breathing: Secondary | ICD-10-CM | POA: Diagnosis not present

## 2020-08-24 DIAGNOSIS — K449 Diaphragmatic hernia without obstruction or gangrene: Secondary | ICD-10-CM | POA: Diagnosis not present

## 2020-08-24 DIAGNOSIS — Z20822 Contact with and (suspected) exposure to covid-19: Secondary | ICD-10-CM | POA: Diagnosis not present

## 2020-08-24 DIAGNOSIS — Z7901 Long term (current) use of anticoagulants: Secondary | ICD-10-CM

## 2020-08-24 DIAGNOSIS — I82890 Acute embolism and thrombosis of other specified veins: Secondary | ICD-10-CM | POA: Diagnosis present

## 2020-08-24 DIAGNOSIS — R000 Tachycardia, unspecified: Secondary | ICD-10-CM | POA: Diagnosis not present

## 2020-08-24 DIAGNOSIS — Z86718 Personal history of other venous thrombosis and embolism: Secondary | ICD-10-CM | POA: Diagnosis not present

## 2020-08-24 DIAGNOSIS — E860 Dehydration: Secondary | ICD-10-CM | POA: Diagnosis not present

## 2020-08-24 DIAGNOSIS — F10231 Alcohol dependence with withdrawal delirium: Secondary | ICD-10-CM | POA: Diagnosis not present

## 2020-08-24 DIAGNOSIS — F10939 Alcohol use, unspecified with withdrawal, unspecified: Secondary | ICD-10-CM | POA: Diagnosis present

## 2020-08-24 DIAGNOSIS — R1110 Vomiting, unspecified: Secondary | ICD-10-CM | POA: Diagnosis not present

## 2020-08-24 DIAGNOSIS — I10 Essential (primary) hypertension: Secondary | ICD-10-CM | POA: Diagnosis not present

## 2020-08-24 DIAGNOSIS — E871 Hypo-osmolality and hyponatremia: Principal | ICD-10-CM | POA: Diagnosis present

## 2020-08-24 DIAGNOSIS — F10931 Alcohol use, unspecified with withdrawal delirium: Secondary | ICD-10-CM

## 2020-08-24 DIAGNOSIS — R079 Chest pain, unspecified: Secondary | ICD-10-CM | POA: Diagnosis not present

## 2020-08-24 LAB — CBC WITH DIFFERENTIAL/PLATELET
Abs Immature Granulocytes: 0.03 K/uL (ref 0.00–0.07)
Basophils Absolute: 0 K/uL (ref 0.0–0.1)
Basophils Relative: 0 %
Eosinophils Absolute: 0 K/uL (ref 0.0–0.5)
Eosinophils Relative: 0 %
HCT: 39.8 % (ref 39.0–52.0)
Hemoglobin: 14.1 g/dL (ref 13.0–17.0)
Immature Granulocytes: 0 %
Lymphocytes Relative: 7 %
Lymphs Abs: 0.8 K/uL (ref 0.7–4.0)
MCH: 32.3 pg (ref 26.0–34.0)
MCHC: 35.4 g/dL (ref 30.0–36.0)
MCV: 91.1 fL (ref 80.0–100.0)
Monocytes Absolute: 0.8 K/uL (ref 0.1–1.0)
Monocytes Relative: 7 %
Neutro Abs: 9.9 K/uL — ABNORMAL HIGH (ref 1.7–7.7)
Neutrophils Relative %: 86 %
Platelets: 187 K/uL (ref 150–400)
RBC: 4.37 MIL/uL (ref 4.22–5.81)
RDW: 13.4 % (ref 11.5–15.5)
WBC: 11.5 K/uL — ABNORMAL HIGH (ref 4.0–10.5)
nRBC: 0 % (ref 0.0–0.2)

## 2020-08-24 LAB — COMPREHENSIVE METABOLIC PANEL
ALT: 14 U/L (ref 0–44)
AST: 26 U/L (ref 15–41)
Albumin: 3.7 g/dL (ref 3.5–5.0)
Alkaline Phosphatase: 86 U/L (ref 38–126)
Anion gap: 9 (ref 5–15)
BUN: 7 mg/dL — ABNORMAL LOW (ref 8–23)
CO2: 23 mmol/L (ref 22–32)
Calcium: 9 mg/dL (ref 8.9–10.3)
Chloride: 90 mmol/L — ABNORMAL LOW (ref 98–111)
Creatinine, Ser: 0.64 mg/dL (ref 0.61–1.24)
GFR, Estimated: >60 mL/min (ref >60)
Glucose, Bld: 145 mg/dL — ABNORMAL HIGH (ref 70–99)
Potassium: 3.5 mmol/L (ref 3.5–5.1)
Sodium: 122 mmol/L — ABNORMAL LOW (ref 135–145)
Total Bilirubin: 1.1 mg/dL (ref 0.3–1.2)
Total Protein: 6.7 g/dL (ref 6.5–8.1)

## 2020-08-24 LAB — TROPONIN I (HIGH SENSITIVITY)
Troponin I (High Sensitivity): 15 ng/L (ref <18)
Troponin I (High Sensitivity): 16 ng/L (ref <18)

## 2020-08-24 LAB — OSMOLALITY: Osmolality: 264 mOsm/kg — ABNORMAL LOW (ref 275–295)

## 2020-08-24 LAB — PHOSPHORUS: Phosphorus: 1.9 mg/dL — ABNORMAL LOW (ref 2.5–4.6)

## 2020-08-24 LAB — ETHANOL: Alcohol, Ethyl (B): <10 mg/dL (ref <10)

## 2020-08-24 LAB — TSH: TSH: 0.542 u[IU]/mL (ref 0.350–4.500)

## 2020-08-24 LAB — LIPASE, BLOOD: Lipase: 25 U/L (ref 11–51)

## 2020-08-24 LAB — MAGNESIUM: Magnesium: 1.5 mg/dL — ABNORMAL LOW (ref 1.7–2.4)

## 2020-08-24 MED ORDER — THIAMINE HCL 100 MG PO TABS
100.0000 mg | ORAL_TABLET | Freq: Every day | ORAL | Status: DC
  Administered 2020-08-24 – 2020-08-27 (×4): 100 mg via ORAL
  Filled 2020-08-24 (×4): qty 1

## 2020-08-24 MED ORDER — ONDANSETRON HCL 4 MG/2ML IJ SOLN
4.0000 mg | Freq: Four times a day (QID) | INTRAMUSCULAR | Status: DC | PRN
  Administered 2020-08-24: 4 mg via INTRAVENOUS
  Filled 2020-08-24: qty 2

## 2020-08-24 MED ORDER — ADULT MULTIVITAMIN W/MINERALS CH
1.0000 | ORAL_TABLET | Freq: Every day | ORAL | Status: DC
  Administered 2020-08-24 – 2020-08-27 (×4): 1 via ORAL
  Filled 2020-08-24 (×4): qty 1

## 2020-08-24 MED ORDER — LORAZEPAM 2 MG/ML IJ SOLN
1.0000 mg | INTRAMUSCULAR | Status: DC | PRN
  Administered 2020-08-24: 2 mg via INTRAVENOUS
  Filled 2020-08-24: qty 1

## 2020-08-24 MED ORDER — LORAZEPAM 2 MG/ML IJ SOLN: 0.0000 mg | Freq: Two times a day (BID) | INTRAMUSCULAR | Status: DC

## 2020-08-24 MED ORDER — LORAZEPAM 1 MG PO TABS
1.0000 mg | ORAL_TABLET | ORAL | Status: DC | PRN
Start: 2020-08-24 — End: 2020-08-27

## 2020-08-24 MED ORDER — SODIUM CHLORIDE 0.9 % IV BOLUS
500.0000 mL | Freq: Once | INTRAVENOUS | Status: AC
  Administered 2020-08-24: 500 mL via INTRAVENOUS

## 2020-08-24 MED ORDER — FOLIC ACID 1 MG PO TABS
1.0000 mg | ORAL_TABLET | Freq: Every day | ORAL | Status: DC
  Administered 2020-08-24 – 2020-08-27 (×4): 1 mg via ORAL
  Filled 2020-08-24 (×4): qty 1

## 2020-08-24 MED ORDER — ACETAMINOPHEN 650 MG RE SUPP: 650.0000 mg | Freq: Four times a day (QID) | RECTAL | Status: DC | PRN

## 2020-08-24 MED ORDER — DOXEPIN HCL 25 MG PO CAPS
25.0000 mg | ORAL_CAPSULE | Freq: Every day | ORAL | Status: DC
  Administered 2020-08-24 – 2020-08-26 (×3): 25 mg via ORAL
  Filled 2020-08-24 (×3): qty 1

## 2020-08-24 MED ORDER — LORAZEPAM 2 MG/ML IJ SOLN
0.0000 mg | Freq: Four times a day (QID) | INTRAMUSCULAR | Status: AC
  Administered 2020-08-24: 1 mg via INTRAVENOUS
  Administered 2020-08-25 (×2): 2 mg via INTRAVENOUS
  Filled 2020-08-24 (×2): qty 1
  Filled 2020-08-24: qty 2

## 2020-08-24 MED ORDER — POTASSIUM CHLORIDE IN NACL 20-0.9 MEQ/L-% IV SOLN: INTRAVENOUS | Status: DC

## 2020-08-24 MED ORDER — BUSPIRONE HCL 5 MG PO TABS
10.0000 mg | ORAL_TABLET | Freq: Two times a day (BID) | ORAL | Status: DC
  Administered 2020-08-24 – 2020-08-27 (×6): 10 mg via ORAL
  Filled 2020-08-24 (×6): qty 2

## 2020-08-24 MED ORDER — ONDANSETRON HCL 4 MG/2ML IJ SOLN
4.0000 mg | Freq: Once | INTRAMUSCULAR | Status: AC
  Administered 2020-08-24: 4 mg via INTRAVENOUS
  Filled 2020-08-24: qty 2

## 2020-08-24 MED ORDER — POLYETHYLENE GLYCOL 3350 17 G PO PACK: 17.0000 g | PACK | Freq: Every day | ORAL | Status: DC | PRN

## 2020-08-24 MED ORDER — APIXABAN 5 MG PO TABS
5.0000 mg | ORAL_TABLET | Freq: Two times a day (BID) | ORAL | Status: DC
  Administered 2020-08-25 – 2020-08-27 (×6): 5 mg via ORAL
  Filled 2020-08-24 (×6): qty 1

## 2020-08-24 MED ORDER — ACETAMINOPHEN 325 MG PO TABS
650.0000 mg | ORAL_TABLET | Freq: Four times a day (QID) | ORAL | Status: DC | PRN
  Administered 2020-08-26 – 2020-08-27 (×3): 650 mg via ORAL
  Filled 2020-08-24 (×4): qty 2

## 2020-08-24 MED ORDER — FOLIC ACID 5 MG/ML IJ SOLN
1.0000 mg | Freq: Once | INTRAMUSCULAR | Status: AC
  Administered 2020-08-24: 1 mg via INTRAVENOUS
  Filled 2020-08-24: qty 0.2

## 2020-08-24 MED ORDER — LORAZEPAM 2 MG/ML IJ SOLN
1.0000 mg | Freq: Once | INTRAMUSCULAR | Status: AC
  Administered 2020-08-24: 1 mg via INTRAVENOUS
  Filled 2020-08-24: qty 1

## 2020-08-24 MED ORDER — THIAMINE HCL 100 MG/ML IJ SOLN: 100.0000 mg | Freq: Every day | INTRAMUSCULAR | Status: DC

## 2020-08-24 MED ORDER — PANTOPRAZOLE SODIUM 40 MG IV SOLR
40.0000 mg | INTRAVENOUS | Status: DC
  Administered 2020-08-24 – 2020-08-25 (×2): 40 mg via INTRAVENOUS
  Filled 2020-08-24 (×3): qty 40

## 2020-08-24 MED ORDER — ONDANSETRON HCL 4 MG PO TABS: 4.0000 mg | ORAL_TABLET | Freq: Four times a day (QID) | ORAL | Status: DC | PRN

## 2020-08-24 MED ORDER — THIAMINE HCL 100 MG/ML IJ SOLN
200.0000 mg | Freq: Once | INTRAMUSCULAR | Status: AC
  Administered 2020-08-24: 200 mg via INTRAVENOUS
  Filled 2020-08-24: qty 2

## 2020-08-24 MED ORDER — METOPROLOL TARTRATE 25 MG PO TABS
25.0000 mg | ORAL_TABLET | Freq: Two times a day (BID) | ORAL | Status: DC
  Administered 2020-08-24 – 2020-08-27 (×5): 25 mg via ORAL
  Filled 2020-08-24 (×6): qty 1

## 2020-08-24 NOTE — ED Triage Notes (Signed)
Patient had eye sx earlier this week.

## 2020-08-24 NOTE — H&P (Addendum)
History and Physical    Jon Bennett VWP:[id number] DOB: 1934-06-09 DOA: 08/24/2020  PCP: [name], MD   Patient coming from: Home  I have personally briefly reviewed patient's old medical [facility]  Chief Complaint: Vomiting  HPI: Jon Bennett is a 86 y.o. male with medical history significant for hypertension, diabetes mellitus, splenic vein thrombosis, alcohol withdrawal. My evaluation, patient initially sleepy, but arouses easily to voice, and able to answer questions appropriately.  He tells he has been vomiting over the past 2 days, but denies diarrhea, no abdominal pain.  Patient also reported some burning chest pain. He tells me he used to drink a lot, but has cut back, but he still sneaks a drinking and beer.  He tells me his last drink was this morning, he had a beer and a shot of alcoholic beverages unable to tell me what exactly.  He denies seizures. - EDP reports that patient had cataract surgery this past Tuesday, so he had cut back on his alcohol.  ED Course: Temperature 98.6, tachycardic to 117, respiratory rate 20-36, blood pressure systolic 106-160.  Sats 98% on room air.  Sodium 122, potassium 3.5.  Normal lipase.  Chest x-ray without acute abnormality.  EKG shows sinus tachycardia without significant changes.  Blood alcohol level less than 10.  1 mg Ativan given, 500 mill bolus given.  Hospitalist to admit for alcohol withdrawal.  Review of Systems: As per HPI all other systems reviewed and negative.  Past Medical History:  Diagnosis Date   Diabetes mellitus without complication (HCC)    Patient is not on any medication for this   GERD (gastroesophageal reflux disease)    Hypertension    Pancreatitis, alcoholic, acute    Splenic vein thrombosis     Past Surgical History:  Procedure Laterality Date   APPENDECTOMY     VASCULAR SURGERY       reports that he has never smoked. He has never used smokeless tobacco. He reports previous  alcohol use of about 1.0 standard drink of alcohol per week. He reports that he does not use drugs.  Allergies  Allergen Reactions   Asa [Aspirin] Shortness Of Breath    Family History  Problem Relation Age of Onset   Hypertension Other    Heart disease Father    Healthy Sister    Healthy Brother    Cancer Maternal Grandfather    Healthy Brother    Healthy Brother    Healthy Sister    Healthy Sister    Healthy Sister    Healthy Brother    Healthy Son    Healthy Son    Healthy Son    Healthy Son     Prior to Admission medications   Medication Sig Start Date End Date Taking? Authorizing Provider  acetaminophen (TYLENOL) 500 MG tablet Take 500 mg by mouth daily as needed for mild pain or moderate pain. Patient not taking: Reported on 02/21/2020    [provider]  baclofen (LIORESAL) 10 MG tablet Take 1 tablet (10 mg total) by mouth daily as needed for muscle spasms. 05/01/19   Sherryll Burger, Pratik D, DO  busPIRone (BUSPAR) 10 MG tablet Take 10 mg by mouth 2 (two) times daily. 08/27/19   [provider]  doxepin (SINEQUAN) 25 MG capsule Take 25 mg by mouth at bedtime. 06/04/19   [provider]  ELIQUIS 5 MG TABS tablet TAKE 1 TABLET BY MOUTH TWICE A DAY 03/24/20   Ellin Saba,  Vern Claude, MD  ELIQUIS 5 MG TABS tablet TAKE 1 TABLET BY MOUTH TWICE A DAY 06/27/20   Doreatha Massed, MD  famotidine (PEPCID) 20 MG tablet Take 1 tablet (20 mg total) by mouth at bedtime. 06/30/19   Vassie Loll, MD  HYDROcodone-acetaminophen (NORCO) 5-325 MG tablet Take 1 tablet by mouth every 4 (four) hours as needed for moderate pain. 04/22/20   Dione Booze, MD  HYDROcodone-acetaminophen (NORCO/VICODIN) 5-325 MG tablet Take 2 tablets by mouth every 4 (four) hours as needed. 04/22/20   Dione Booze, MD  metoprolol tartrate (LOPRESSOR) 25 MG tablet Take 1 tablet (25 mg total) by mouth 2 (two) times daily. 08/18/18   Catarina Hartshorn, MD  ondansetron (ZOFRAN ODT) 8 MG disintegrating tablet Take 1  tablet (8 mg total) by mouth every 8 (eight) hours as needed for nausea or vomiting. 06/30/19   Vassie Loll, MD  ondansetron (ZOFRAN) 4 MG tablet Take 1 tablet (4 mg total) by mouth every 8 (eight) hours as needed for nausea or vomiting. 08/06/20   Cheryll Cockayne, MD  pantoprazole (PROTONIX) 40 MG tablet Take 1 tablet (40 mg total) by mouth daily. 07/01/19   Vassie Loll, MD  sertraline (ZOLOFT) 100 MG tablet Take 100 mg by mouth daily. 08/27/19   [provider]  vitamin B-12 (CYANOCOBALAMIN) 1000 MCG tablet Take 1 tablet (1,000 mcg total) by mouth daily. 06/30/19   Vassie Loll, MD  Vitamin D, Ergocalciferol, (DRISDOL) 1.25 MG (50000 UNIT) CAPS capsule Take 50,000 Units by mouth every 7 (seven) days.    [provider]    Physical Exam: Vitals:   08/24/20 1400 08/24/20 1430 08/24/20 1457 08/24/20 1500  BP: (!) 170/104 (!) 187/125 (!) 147/87 (!) 147/87  Pulse: (!) 104 (!) 124  (!) 103  Resp: (!) 27 (!) 36  20  Temp:      SpO2: 100% 100%  97%  Weight:      Height:        Constitutional: Sleeping but easily arousable, appears jittery, generalized tremors, Vitals:   08/24/20 1400 08/24/20 1430 08/24/20 1457 08/24/20 1500  BP: (!) 170/104 (!) 187/125 (!) 147/87 (!) 147/87  Pulse: (!) 104 (!) 124  (!) 103  Resp: (!) 27 (!) 36  20  Temp:      SpO2: 100% 100%  97%  Weight:      Height:       Eyes: PERRL, lids and conjunctivae normal ENMT: Mucous membranes are moist.  Vomited on himself while I was in the room, light brownish liquid Neck: normal, supple, no masses, no thyromegaly Respiratory:  Normal respiratory effort. No accessory muscle use.  Cardiovascular: Cardiac, regular rate and rhythm,  No extremity edema. 2+ pedal pulses. N Abdomen: no tenderness, no masses palpated. No hepatosplenomegaly. Bowel sounds positive.  Musculoskeletal: no clubbing / cyanosis. No joint deformity upper and lower extremities. Good ROM, no contractures. Normal muscle tone.  Skin: no  rashes, lesions, ulcers. No induration Neurologic: No apparent cranial abnormality, 4+5 strength in all extremities Psychiatric: Normal judgment and insight. Alert and oriented x 4, Normal mood.   Labs on Admission: I have personally reviewed following labs and imaging studies  CBC: Recent Labs  Lab 08/24/20 1305  WBC 11.5*  NEUTROABS 9.9*  HGB 14.1  HCT 39.8  MCV 91.1  PLT 187   Basic Metabolic Panel: Recent Labs  Lab 08/24/20 1305  NA 122*  K 3.5  CL 90*  CO2 23  GLUCOSE 145*  BUN 7*  CREATININE  0.64  CALCIUM 9.0   GFR: Estimated Creatinine Clearance: 65.1 mL/min (by C-G formula based on SCr of 0.64 mg/dL). Liver Function Tests: Recent Labs  Lab 08/24/20 1305  AST 26  ALT 14  ALKPHOS 86  BILITOT 1.1  PROT 6.7  ALBUMIN 3.7   Recent Labs  Lab 08/24/20 1305  LIPASE 25    Radiological Exams on Admission: DG Chest Port 1 View  Result Date: 08/24/2020 CLINICAL DATA:  Chest pain and vomiting. EXAM: PORTABLE CHEST 1 VIEW COMPARISON:  04/21/2020 FINDINGS: The cardiac silhouette, mediastinal and hilar contours are within normal limits and stable. There are chronic bronchitic type interstitial lung changes but no infiltrates or effusions. Stable hiatal hernia. The bony thorax is intact. IMPRESSION: Chronic lung changes but no acute pulmonary findings. Electronically Signed   By: Rudie Meyer M.D.   On: 08/24/2020 13:14    EKG: Independently reviewed.  Sinus tachycardia rate 114, QTc 455.  No significant change from prior.  Assessment/Plan Principal Problem:   Hyponatremia Active Problems:   Alcohol withdrawal ([name]   Splenic vein thrombosis    Hyponatremia-sodium 122, baseline 133-138.  Likely due to vomiting, poor oral intake and possible component of beer potomania.   -500 mill bolus given, continue N/s 75cc/hr -BMP in the morning -Check urine and serum osmolality -Urine sodium -Check TSH  Alcohol  withdrawal-previously heavy drinker, unable to quantify how much, tells me he has cut back, last drink was this morning.  Blood alcohol < 10.  Tremulous, tachycardic and tachypneic.  Some tachycardia likely from dehydration also. -CIWA as needed and scheduled -Thiamine folate multivitamins -Check magnesium, phosphorus  Vomiting-denies diarrhea.  Abdominal exam benign.  WBC 11.5. -Conservative management for now, Zofran as needed -Bowel rest with clear liquid diet, advance as tolerated -Hydrate  Chest pain-likely related to multiple episodes of vomiting.  EKG troponins unremarkable..  No cardiac history. -Repeat EKG in the morning. -IV Protonix 40 daily  Diabetes mellitus random glucose 145.  Not on medication. - HgbA1c - Daily CBG  Hypertension - Stable.  Not on medications.  Splenic vein thrombosis - Resume Eliquis   DVT prophylaxis: Eliquis Code Status: Full code Family Communication: None at bedside Disposition Plan: ~ 1- 2 days Consults called: none Admission status: Obs, tele I certify that at the point of admission it is my clinical judgment that the patient will require inpatient hospital care spanning beyond 2 midnights from the point of admission due to high intensity of service, high risk for further deterioration and high frequency of surveillance required.    Onnie Boer MD [facility]  08/24/2020, 10:28 PM

## 2020-08-24 NOTE — ED Notes (Signed)
Patient actively vomiting while triaging. Gave verbal consent for MSE.

## 2020-08-24 NOTE — ED Triage Notes (Signed)
Patient reports nausea and vomiting with diarrhea for the past two days. States he believes he has been running a fever. Denies any recent ETOH use.

## 2020-08-24 NOTE — ED Triage Notes (Signed)
Reports poor PO intake.

## 2020-08-24 NOTE — Progress Notes (Signed)
   08/24/20 1705  Vitals  Temp 98.1 F (36.7 C)  Temp Source Oral  BP (!) 158/79  MAP (mmHg) 104  BP Location Left Arm  BP Method Automatic  Patient Position (if appropriate) Lying  Pulse Rate (!) 107  Pulse Rate Source Monitor  MEWS COLOR  MEWS Score Color Yellow  Oxygen Therapy  SpO2 99 %  O2 Device Room Air  MEWS Score  MEWS Temp 0  MEWS Systolic 0  MEWS Pulse 1  MEWS RR 1  MEWS LOC 0  MEWS Score 2   MD notified no New orders at this time

## 2020-08-24 NOTE — ED Provider Notes (Signed)
[facility] EMERGENCY DEPARTMENT Provider Note   CSN: [id number] Arrival date & time: 08/24/20  1111     History Chief Complaint  Patient presents with   Nausea   Emesis    Jon Bennett is a 86 y.o. male.  HPI He presents for evaluation of burning chest pain, and vomiting.  He states the burning chest pain started prior to the vomiting.  He reports being ill for 2 days with same.  He is a somewhat poor historian.  He came here for evaluation by EMS.  He states he lives with his son.  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   Dyskinesia    Absolute anemia    Akathisia 06/29/2019   Nausea and vomiting 06/29/2019   Acathisia 06/29/2019   Acute hyperactive alcohol withdrawal delirium (HCC) 04/24/2019   Type 2 diabetes mellitus (HCC)    Hypertension    GERD (gastroesophageal reflux disease)    Thrombocytopenia (HCC) 08/16/2018   Alcohol withdrawal (HCC) 08/13/2018    Past Surgical History:  Procedure Laterality Date   APPENDECTOMY     VASCULAR SURGERY         Family History  Problem Relation Age of Onset   Hypertension Other    Heart disease Father    Healthy Sister    Healthy Brother    Cancer Maternal Grandfather    Healthy Brother    Healthy Brother    Healthy Sister    Healthy Sister    Healthy Sister    Healthy Brother    Healthy Son    Healthy Son    Healthy Son    Healthy Son     Social History   Tobacco Use   Smoking status: Never   Smokeless tobacco: Never  Vaping Use   Vaping Use: Never used  Substance Use Topics   Alcohol use: Not Currently    Alcohol/week: 1.0 standard drink    Types: 1 Shots of  liquor per week   Drug use: Never    Home Medications Prior to Admission medications   Medication Sig Start Date End Date Taking? Authorizing Provider  acetaminophen (TYLENOL) 500 MG tablet Take 500 mg by mouth daily as needed for mild pain or moderate pain. Patient not taking: Reported on 02/21/2020    [provider]  baclofen (LIORESAL) 10 MG tablet Take 1 tablet (10 mg total) by mouth daily as needed for muscle spasms. 05/01/19   Sherryll BurgerShah, Pratik D, DO  busPIRone (BUSPAR) 10 MG tablet Take 10 mg by mouth 2 (two) times daily. 08/27/19   [provider]  doxepin (SINEQUAN) 25 MG capsule Take 25 mg by mouth at bedtime. 06/04/19   [provider]  ELIQUIS 5 MG TABS tablet TAKE 1 TABLET BY MOUTH TWICE A DAY 03/24/20   Doreatha MassedKatragadda, Sreedhar, MD  ELIQUIS 5 MG TABS tablet TAKE 1 TABLET BY MOUTH TWICE A DAY 06/27/20   Doreatha MassedKatragadda, Sreedhar, MD  famotidine (PEPCID) 20 MG tablet Take 1 tablet (20 mg total) by mouth at bedtime. 06/30/19   Vassie LollMadera, Carlos, MD  HYDROcodone-acetaminophen (NORCO) 5-325 MG tablet Take 1 tablet  by mouth every 4 (four) hours as needed for moderate pain. 04/22/20   Dione Booze, MD  HYDROcodone-acetaminophen (NORCO/VICODIN) 5-325 MG tablet Take 2 tablets by mouth every 4 (four) hours as needed. 04/22/20   Dione Booze, MD  metoprolol tartrate (LOPRESSOR) 25 MG tablet Take 1 tablet (25 mg total) by mouth 2 (two) times daily. 08/18/18   Catarina Hartshorn, MD  ondansetron (ZOFRAN ODT) 8 MG disintegrating tablet Take 1 tablet (8 mg total) by mouth every 8 (eight) hours as needed for nausea or vomiting. 06/30/19   Vassie Loll, MD  ondansetron (ZOFRAN) 4 MG tablet Take 1 tablet (4 mg total) by mouth every 8 (eight) hours as needed for nausea or vomiting. 08/06/20   Cheryll Cockayne, MD  pantoprazole (PROTONIX) 40 MG tablet Take 1 tablet (40 mg total) by mouth daily. 07/01/19   Vassie Loll, MD  sertraline (ZOLOFT) 100 MG tablet Take 100 mg by mouth daily. 08/27/19   [provider]  vitamin B-12 (CYANOCOBALAMIN) 1000 MCG tablet Take 1 tablet (1,000 mcg total) by mouth daily. 06/30/19   Vassie Loll, MD  Vitamin D, Ergocalciferol, (DRISDOL) 1.25 MG (50000 UNIT) CAPS capsule Take 50,000 Units by mouth every 7 (seven) days.    [provider]    Allergies    Asa [aspirin]  Review of Systems   Review of Systems  All other systems reviewed and are negative.  Physical Exam Updated Vital Signs BP (!) 147/87   Pulse (!) 103   Temp 98.9 F (37.2 C)   Resp 20   Ht 5\' 9"  (1.753 m)   Wt 65.8 kg   SpO2 97%   BMI 21.42 kg/m   Physical Exam Vitals and nursing note reviewed.  Constitutional:      General: He is in acute distress.     Appearance: He is well-developed. He is ill-appearing. He is not toxic-appearing or diaphoretic.     Comments: Elderly, frail; he is holding an emesis bag and appears like he might vomit at any moment.  HENT:     Head: Normocephalic and atraumatic.     Right Ear: External ear normal.     Left Ear: External ear normal.  Eyes:     Conjunctiva/sclera: Conjunctivae normal.     Pupils: Pupils are equal, round, and reactive to light.  Neck:     Trachea: Phonation normal.  Cardiovascular:     Rate and Rhythm: Normal rate and regular rhythm.     Heart sounds: Normal heart sounds.  Pulmonary:     Effort: Pulmonary effort is normal.     Breath sounds: Normal breath sounds.  Abdominal:     General: There is no distension.     Palpations: Abdomen is soft.     Tenderness: There is no abdominal tenderness.  Musculoskeletal:        General: Normal range of motion.     Cervical back: Normal range of motion and neck supple.  Skin:    General: Skin is warm and dry.  Neurological:     Mental Status: He is alert.     Cranial Nerves: No cranial nerve deficit.     Motor: No abnormal muscle tone.     Coordination: Coordination normal.  Psychiatric:        Mood and Affect: Mood normal.        Behavior: Behavior  normal.    ED Results / Procedures / Treatments   Labs (all labs ordered are listed, but only abnormal  results are displayed) Labs Reviewed  COMPREHENSIVE METABOLIC PANEL - Abnormal; Notable for the following components:      Result Value   Sodium 122 (*)    Chloride 90 (*)    Glucose, Bld 145 (*)    BUN 7 (*)    All other components within normal limits  CBC WITH DIFFERENTIAL/PLATELET - Abnormal; Notable for the following components:   WBC 11.5 (*)    Neutro Abs 9.9 (*)    All other components within normal limits  SARS CORONAVIRUS 2 (TAT 6-24 HRS)  LIPASE, BLOOD  ETHANOL  TROPONIN I (HIGH SENSITIVITY)  TROPONIN I (HIGH SENSITIVITY)    EKG EKG Interpretation  Date/Time:  Sunday August 24 2020 11:30:19 EDT Ventricular Rate:  114 PR Interval:  188 QRS Duration: 92 QT Interval:  330 QTC Calculation: 455 R Axis:   -34 Text Interpretation: Sinus tachycardia Ventricular premature complex Consider left atrial enlargement Left axis deviation Anterior infarct, old Abnormal T, consider ischemia, lateral leads Since last tracing nonspecific T wave abnormalities present Confirmed by Mancel Bale [phone]) on 08/24/2020 1:13:05 PM  Radiology DG Chest Port 1 View  Result Date: 08/24/2020 CLINICAL DATA:  Chest pain and vomiting. EXAM: PORTABLE CHEST 1 VIEW COMPARISON:  04/21/2020 FINDINGS: The cardiac silhouette, mediastinal and hilar contours are within normal limits and stable. There are chronic bronchitic type interstitial lung changes but no infiltrates or effusions. Stable hiatal hernia. The bony thorax is intact. IMPRESSION: Chronic lung changes but no acute pulmonary findings. Electronically Signed   By: Rudie Meyer M.D.   On: 08/24/2020 13:14    Procedures .Critical Care  Date/Time: 08/24/2020 3:05 PM Performed by: Mancel Bale, MD Authorized by: Mancel Bale, MD   Critical care provider statement:    Critical care time (minutes):  35   Critical care start time:   08/24/2020 12:30 PM   Critical care end time:  08/24/2020 3:05 PM   Critical care time was exclusive of:  Separately billable procedures and treating other patients   Critical care was necessary to treat or prevent imminent or life-threatening deterioration of the following conditions:  CNS failure or compromise   Critical care was time spent personally by me on the following activities:  Blood draw for specimens, development of treatment plan with patient or surrogate, discussions with consultants, evaluation of patient's response to treatment, examination of patient, obtaining history from patient or surrogate, ordering and performing treatments and interventions, ordering and review of laboratory studies, pulse oximetry, re-evaluation of patient's condition, review of old charts and ordering and review of radiographic studies   Medications Ordered in ED Medications  ondansetron (ZOFRAN) injection 4 mg (4 mg Intravenous Given 08/24/20 1251)  sodium chloride 0.9 % bolus 500 mL (0 mLs Intravenous Stopped 08/24/20 1407)  thiamine (B-1) injection 200 mg (200 mg Intravenous Given 08/24/20 1433)  LORazepam (ATIVAN) injection 1 mg (1 mg Intravenous Given 08/24/20 1431)  folic acid injection 1 mg (1 mg Intravenous Given 08/24/20 1434)    ED Course  I have reviewed the triage vital signs and the nursing notes.  Pertinent labs & imaging results that were available during my care of the patient were reviewed by me and considered in my medical decision making (see chart for details).  Clinical Course as of 08/24/20 1505  Sun Aug 24, 2020  1412 Patient has become tremorous and now admits to withdrawing from alcohol. [EW]    Clinical Course User Index [EW] Mancel Bale, MD   MDM Rules/Calculators/A&P  Patient Vitals for the past 24 hrs:  BP Temp Pulse Resp SpO2 Height Weight  08/24/20 1500 (!) 147/87 -- (!) 103 20 97 % -- --  08/24/20 1457 (!) 147/87 -- -- -- -- -- --   08/24/20 1430 (!) 187/125 -- (!) 124 (!) 36 100 % -- --  08/24/20 1400 (!) 170/104 -- (!) 104 (!) 27 100 % -- --  08/24/20 1330 (!) 167/89 -- (!) 103 (!) 27 100 % -- --  08/24/20 1300 (!) 166/89 -- (!) 102 (!) 26 97 % -- --  08/24/20 1231 (!) 155/99 -- (!) 102 (!) 26 98 % -- --  08/24/20 1200 (!) 155/86 -- (!) 102 (!) 25 98 % -- --  08/24/20 1123 (!) 160/93 98.9 F (37.2 C) (!) 117 (!) 24 98 % -- --  08/24/20 1118 -- -- -- -- -- 5\' 9"  (1.753 m) 65.8 kg    2:42 PM Reevaluation with update and discussion. After initial assessment and treatment, an updated evaluation reveals states he is feeling better after treatment.  He understands he will need to stay in the hospital.   Medical Decision Making:  This patient is presenting for evaluation of chest pain and vomiting, which does require a range of treatment options, and is a complaint that involves a high risk of morbidity and mortality. The differential diagnoses include ACS, pneumonia, esophageal or intestinal obstruction. I decided to review old records, and in summary is an elderly male, with history of diabetes, GERD and high blood pressure.  He cannot give me complete recent.  His complaints are worrisome for acute illness.    I obtained additional historical information from patient son Mancel Bale..  He states that typically the patient is very functional, caring for himself at home, driving and managing his affairs.  The patient had eye surgery, cataract removal, 6 days ago.  [name] thinks that the patient has not had an alcohol drink since that time.  He states his father is an alcoholic and "drinks a lot."  The patient has previously had problems with alcohol withdrawal.  He apparently followed up with the ophthalmologist, the day after surgery and things were doing well at that time.  Clinical Laboratory Tests Ordered, included CBC, Metabolic panel, and viral panel, lipase, troponin, alcohol level . Review indicates initial  evaluation indicates sodium low, chloride low, glucose high, BUN low, white count high, otherwise reassuring.. Radiologic Tests Ordered, included chest x-ray.  I independently Visualized: Radiograph images, which show chronic and unchanged Critical Interventions-clinical evaluation, laboratory testing, IV fluids, medication treatment, chest x-ray, observation and reassessment  After These Interventions, the Patient was reevaluated and was found to be tremulous, and complaining of alcohol withdrawal symptoms.  CRITICAL CARE-yes Performed by: [name]  Nursing Notes Reviewed/ Care Coordinated Applicable Imaging Reviewed Interpretation of Laboratory Data incorporated into ED treatment  2:56 PM-Consult complete with hospitalist. Patient case explained and discussed.  She agrees to admit patient for further evaluation and treatment. Call ended at 3:25 PM    Final Clinical Impression(s) / ED Diagnoses Final diagnoses:  Alcohol withdrawal delirium [facility])    Rx / DC Orders ED Discharge Orders     None        [facility], MD 08/24/20 1525

## 2020-08-24 NOTE — ED Notes (Signed)
Hospitalist at bedside 

## 2020-08-25 DIAGNOSIS — Z20822 Contact with and (suspected) exposure to covid-19: Secondary | ICD-10-CM | POA: Diagnosis not present

## 2020-08-25 DIAGNOSIS — E871 Hypo-osmolality and hyponatremia: Secondary | ICD-10-CM | POA: Diagnosis not present

## 2020-08-25 DIAGNOSIS — I10 Essential (primary) hypertension: Secondary | ICD-10-CM | POA: Diagnosis not present

## 2020-08-25 DIAGNOSIS — F10231 Alcohol dependence with withdrawal delirium: Secondary | ICD-10-CM | POA: Diagnosis not present

## 2020-08-25 LAB — BASIC METABOLIC PANEL
Anion gap: 3 — ABNORMAL LOW (ref 5–15)
BUN: 7 mg/dL — ABNORMAL LOW (ref 8–23)
CO2: 29 mmol/L (ref 22–32)
Calcium: 8.8 mg/dL — ABNORMAL LOW (ref 8.9–10.3)
Chloride: 101 mmol/L (ref 98–111)
Creatinine, Ser: 0.62 mg/dL (ref 0.61–1.24)
GFR, Estimated: >60 mL/min (ref >60)
Glucose, Bld: 108 mg/dL — ABNORMAL HIGH (ref 70–99)
Potassium: 4.4 mmol/L (ref 3.5–5.1)
Sodium: 133 mmol/L — ABNORMAL LOW (ref 135–145)

## 2020-08-25 LAB — CBC
HCT: 36.4 % — ABNORMAL LOW (ref 39.0–52.0)
Hemoglobin: 12.4 g/dL — ABNORMAL LOW (ref 13.0–17.0)
MCH: 32.2 pg (ref 26.0–34.0)
MCHC: 34.1 g/dL (ref 30.0–36.0)
MCV: 94.5 fL (ref 80.0–100.0)
Platelets: 139 10*3/uL — ABNORMAL LOW (ref 150–400)
RBC: 3.85 MIL/uL — ABNORMAL LOW (ref 4.22–5.81)
RDW: 13.8 % (ref 11.5–15.5)
WBC: 9.8 10*3/uL (ref 4.0–10.5)
nRBC: 0 % (ref 0.0–0.2)

## 2020-08-25 LAB — SARS CORONAVIRUS 2 (TAT 6-24 HRS): SARS Coronavirus 2: NEGATIVE

## 2020-08-25 LAB — GLUCOSE, CAPILLARY: Glucose-Capillary: 111 mg/dL — ABNORMAL HIGH (ref 70–99)

## 2020-08-25 NOTE — Progress Notes (Signed)
PROGRESS NOTE    Jon Bennett  JSH:[id number] DOB: 28-Aug-1934 DOA: 08/24/2020 PCP: [name], MD   Brief Narrative:   Jon Bennett is a 86 y.o. male with medical history significant for hypertension, diabetes mellitus, splenic vein thrombosis, alcohol withdrawal.  Patient states that he normally drinks quite a bit of alcohol, but has recently cut back due to his cataract surgery this past Tuesday.  He was admitted with hyponatremia and alcohol withdrawal symptoms. He will need PT evaluation and likely placement to SNF.  Assessment & Plan:   Principal Problem:   Hyponatremia Active Problems:   Alcohol withdrawal ([name]   Splenic vein thrombosis   Alcohol withdrawal-ongoing with somnolence -Drinks 3-4 hard liquor drinks per day according to his son -Continue on CIWA protocol -Check electrolyte electrolytes in a.m.  Intractable nausea and vomiting -Abdominal exam has been benign -Advance clear liquid diet to soft  Hypoosmolar hyponatremia-improved -Related to chronic alcohol use as well as some dehydration -Currently improved, recheck in a.m.  Chest pain -Troponins and EKG within normal limits  Diabetes mellitus -Blood glucose currently stable, not on medication -Check hemoglobin A1c  Hypertension-stable  Splenic vein thrombosis -Continue Eliquis  DVT prophylaxis:Eliquis Code Status: Full Family Communication: Discussed with son on phone 6/13 Disposition Plan:  Status is: Observation  The patient will require care spanning > 2 midnights and should be moved to inpatient because: Altered mental status  Dispo: The patient is from: Home              Anticipated d/c is to: SNF              Patient currently is not medically stable to d/c.   Difficult to place patient No   Consultants:  None  Procedures:  See below  Antimicrobials:  None   Subjective: Patient seen and evaluated today with no new  acute complaints or concerns. No acute concerns or events noted overnight.  He appears quite somnolent, but arousable to questioning this morning.  Objective: Vitals:   08/24/20 2024 08/24/20 2132 08/25/20 0432 08/25/20 0832  BP: (!) 134/105 (!) 164/101 101/67 122/75  Pulse: 96 98 71 72  Resp: 16  15   Temp: 97.8 F (36.6 C)  97.7 F (36.5 C)   TempSrc: Oral  Oral   SpO2: 99%  99%   Weight:      Height:        Intake/Output Summary (Last 24 hours) at 08/25/2020 1220 Last data filed at 08/25/2020 0900 Gross per 24 hour  Intake 1730.2 ml  Output 1350 ml  Net 380.2 ml   Filed Weights   08/24/20 1118  Weight: 65.8 kg    Examination:  General exam: Appears calm and comfortable, somnolent but arousable Respiratory system: Clear to auscultation. Respiratory effort normal. Cardiovascular system: S1 & S2 heard, RRR.  Gastrointestinal system: Abdomen is soft Central nervous system: Alert and awake Extremities: No edema Skin: No significant lesions noted Psychiatry: Flat affect.    Data Reviewed: I have personally reviewed following labs and imaging studies  CBC: Recent Labs  Lab 08/24/20 1305 08/25/20 0433  WBC 11.5* 9.8  NEUTROABS 9.9*  --   HGB 14.1 12.4*  HCT 39.8 36.4*  MCV 91.1 94.5  PLT 187 139*   Basic Metabolic Panel: Recent Labs  Lab 08/24/20 1305 08/24/20 1458 08/25/20 0433  NA 122*  --  133*  K 3.5  --  4.4  CL 90*  --  101  CO2 23  --  29  GLUCOSE 145*  --  108*  BUN 7*  --  7*  CREATININE 0.64  --  0.62  CALCIUM 9.0  --  8.8*  MG  --  1.5*  --   PHOS  --  1.9*  --    GFR: Estimated Creatinine Clearance: 65.1 mL/min (by C-G formula based on SCr of 0.62 mg/dL). Liver Function Tests: Recent Labs  Lab 08/24/20 1305  AST 26  ALT 14  ALKPHOS 86  BILITOT 1.1  PROT 6.7  ALBUMIN 3.7   Recent Labs  Lab 08/24/20 1305  LIPASE 25   No results for input(s): AMMONIA in the last 168 hours. Coagulation Profile: No results for input(s): INR,  PROTIME in the last 168 hours. Cardiac Enzymes: No results for input(s): CKTOTAL, CKMB, CKMBINDEX, TROPONINI in the last 168 hours. BNP (last 3 results) No results for input(s): PROBNP in the last 8760 hours. HbA1C: No results for input(s): HGBA1C in the last 72 hours. CBG: Recent Labs  Lab 08/25/20 0658  GLUCAP 111*   Lipid Profile: No results for input(s): CHOL, HDL, LDLCALC, TRIG, CHOLHDL, LDLDIRECT in the last 72 hours. Thyroid Function Tests: Recent Labs    08/24/20 1314  TSH 0.542   Anemia Panel: No results for input(s): VITAMINB12, FOLATE, FERRITIN, TIBC, IRON, RETICCTPCT in the last 72 hours. Sepsis Labs: No results for input(s): PROCALCITON, LATICACIDVEN in the last 168 hours.  Recent Results (from the past 240 hour(s))  SARS CORONAVIRUS 2 (TAT 6-24 HRS) Nasopharyngeal Nasopharyngeal Swab     Status: None   Collection Time: 08/24/20  2:34 PM   Specimen: Nasopharyngeal Swab  Result Value Ref Range Status   SARS Coronavirus 2 NEGATIVE NEGATIVE Final    Comment: (NOTE) SARS-CoV-2 target nucleic acids are NOT DETECTED.  The SARS-CoV-2 RNA is generally detectable in upper and lower respiratory specimens during the acute phase of infection. Negative results do not preclude SARS-CoV-2 infection, do not rule out co-infections with other pathogens, and should not be used as the sole basis for treatment or other patient management decisions. Negative results must be combined with clinical observations, patient history, and epidemiological information. The expected result is Negative.  Fact Sheet for Patients: HairSlick.no  Fact Sheet for Healthcare Providers: quierodirigir.com  This test is not yet approved or cleared by the [place] FDA and  has been authorized for detection and/or diagnosis of SARS-CoV-2 by FDA under an Emergency Use Authorization (EUA). This EUA will remain  in effect (meaning this test  can be used) for the duration of the COVID-19 declaration under Se ction 564(b)(1) of the Act, 21 U.S.C. section 360bbb-3(b)(1), unless the authorization is terminated or revoked sooner.  Performed at [facility], [address] [place], [place] [address]          Radiology Studies: DG Chest Port 1 View  Result Date: 08/24/2020 CLINICAL DATA:  Chest pain and vomiting. EXAM: PORTABLE CHEST 1 VIEW COMPARISON:  04/21/2020 FINDINGS: The cardiac silhouette, mediastinal and hilar contours are within normal limits and stable. There are chronic bronchitic type interstitial lung changes but no infiltrates or effusions. Stable hiatal hernia. The bony thorax is intact. IMPRESSION: Chronic lung changes but no acute pulmonary findings. Electronically Signed   By: Rudie Meyer M.D.   On: 08/24/2020 13:14        Scheduled Meds:  apixaban  5 mg Oral BID   busPIRone  10 mg  Oral BID   doxepin  25 mg Oral QHS   folic acid  1 mg Oral Daily   LORazepam  0-4 mg Intravenous Q6H   Followed by   Melene Muller ON 08/26/2020] LORazepam  0-4 mg Intravenous Q12H   metoprolol tartrate  25 mg Oral BID   multivitamin with minerals  1 tablet Oral Daily   pantoprazole (PROTONIX) IV  40 mg Intravenous Q24H   thiamine  100 mg Oral Daily   Or   thiamine  100 mg Intravenous Daily     LOS: 0 days    Time spent: 35 minutes    Nike Southwell Hoover Brunette, DO [facility]  If 7PM-7AM, please contact night-coverage [email or web address] 08/25/2020, 12:20 PM

## 2020-08-25 NOTE — Progress Notes (Signed)
Tele called with report on patient being sinus rhythm, heart rate of 63, and PACs. MD Sherryll Burger notified.

## 2020-08-25 NOTE — TOC Initial Note (Signed)
Transition of Care [facility]) - Initial/Assessment Note    Patient Details  Name: Jon Bennett MRN: [id number] Date of Birth: [birth year]  [facility] CM/SW Contact:    Villa Herb, LCSWA Phone Number: 08/25/2020, 11:28 AM  Clinical Narrative:                 [facility] consulted for substance use resources. CSW spoke with pt in room to complete assessment. Pt states that he lives with his son [name]. Pt states that he is independent in completing his ADLs and provides his own transportation. Pt states that he has not had HH services. Pt has a cane and walker but states he does not need to use them. Pt states that he tries not to drink, pt was interested in resources for SA, resources left in room with pt. TOC to follow.   Expected Discharge Plan: Home/Self Care Barriers to Discharge: Continued Medical Work up   Patient Goals and CMS Choice Patient states their goals for this hospitalization and ongoing recovery are:: Go home CMS Medicare.gov Compare Post Acute Care list provided to:: Patient Choice offered to / list presented to : Patient  Expected Discharge Plan and Services Expected Discharge Plan: Home/Self Care In-house Referral: Clinical Social Work Discharge Planning Services: CM Consult Post Acute Care Choice: NA Living arrangements for the past 2 months: Single Family Home                 DME Arranged: N/A DME Agency: NA       HH Arranged: NA HH Agency: NA        Prior Living Arrangements/Services Living arrangements for the past 2 months: Single Family Home Lives with:: Adult Children Patient language and need for interpreter reviewed:: Yes Do you feel safe going back to the place where you live?: Yes      Need for Family Participation in Patient Care: No (Comment) Care giver support system in place?: Yes (comment) Current home services: DME Criminal Activity/Legal Involvement Pertinent to Current Situation/Hospitalization: No - Comment as  needed  Activities of Daily Living Home Assistive Devices/Equipment: Dentures (specify type), Cane (specify quad or straight) ADL Screening (condition at time of admission) Patient's cognitive ability adequate to safely complete daily activities?: No Is the patient deaf or have difficulty hearing?: No Does the patient have difficulty seeing, even when wearing glasses/contacts?: No Does the patient have difficulty concentrating, remembering, or making decisions?: Yes Patient able to express need for assistance with ADLs?: Yes Does the patient have difficulty dressing or bathing?: No Independently performs ADLs?: Yes (appropriate for developmental age) Does the patient have difficulty walking or climbing stairs?: Yes Weakness of Legs: None Weakness of Arms/Hands: None  Permission Sought/Granted                  Emotional Assessment Appearance:: Appears stated age Attitude/Demeanor/Rapport: Lethargic, Engaged Affect (typically observed): Accepting, Quiet Orientation: : Oriented to Self, Oriented to Place, Oriented to  Time, Oriented to Situation Alcohol / Substance Use: Alcohol Use Psych Involvement: No (comment)  Admission diagnosis:  Alcohol withdrawal delirium (HCC) [F10.231] Hyponatremia [E87.1] Patient Active Problem List   Diagnosis Date Noted   Hyponatremia 08/24/2020   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 [place] [address] Phone: [phone] Fax:  [phone]     Social Determinants of Health (SDOH) Interventions    Readmission Risk Interventions No flowsheet data found.

## 2020-08-26 DIAGNOSIS — Z20822 Contact with and (suspected) exposure to covid-19: Secondary | ICD-10-CM | POA: Diagnosis not present

## 2020-08-26 DIAGNOSIS — E871 Hypo-osmolality and hyponatremia: Secondary | ICD-10-CM | POA: Diagnosis not present

## 2020-08-26 DIAGNOSIS — Z86718 Personal history of other venous thrombosis and embolism: Secondary | ICD-10-CM | POA: Diagnosis not present

## 2020-08-26 DIAGNOSIS — E119 Type 2 diabetes mellitus without complications: Secondary | ICD-10-CM | POA: Diagnosis not present

## 2020-08-26 DIAGNOSIS — E860 Dehydration: Secondary | ICD-10-CM | POA: Diagnosis not present

## 2020-08-26 DIAGNOSIS — R2681 Unsteadiness on feet: Secondary | ICD-10-CM | POA: Diagnosis not present

## 2020-08-26 DIAGNOSIS — I10 Essential (primary) hypertension: Secondary | ICD-10-CM | POA: Diagnosis not present

## 2020-08-26 DIAGNOSIS — Z7901 Long term (current) use of anticoagulants: Secondary | ICD-10-CM | POA: Diagnosis not present

## 2020-08-26 DIAGNOSIS — F10231 Alcohol dependence with withdrawal delirium: Secondary | ICD-10-CM | POA: Diagnosis not present

## 2020-08-26 LAB — CBC
HCT: 39.7 % (ref 39.0–52.0)
Hemoglobin: 13.5 g/dL (ref 13.0–17.0)
MCH: 32.7 pg (ref 26.0–34.0)
MCHC: 34 g/dL (ref 30.0–36.0)
MCV: 96.1 fL (ref 80.0–100.0)
Platelets: 133 10*3/uL — ABNORMAL LOW (ref 150–400)
RBC: 4.13 MIL/uL — ABNORMAL LOW (ref 4.22–5.81)
RDW: 14.1 % (ref 11.5–15.5)
WBC: 7.3 10*3/uL (ref 4.0–10.5)
nRBC: 0 % (ref 0.0–0.2)

## 2020-08-26 LAB — BASIC METABOLIC PANEL
Anion gap: 7 (ref 5–15)
BUN: 11 mg/dL (ref 8–23)
CO2: 27 mmol/L (ref 22–32)
Calcium: 9 mg/dL (ref 8.9–10.3)
Chloride: 100 mmol/L (ref 98–111)
Creatinine, Ser: 0.76 mg/dL (ref 0.61–1.24)
GFR, Estimated: >60 mL/min (ref >60)
Glucose, Bld: 115 mg/dL — ABNORMAL HIGH (ref 70–99)
Potassium: 3.7 mmol/L (ref 3.5–5.1)
Sodium: 134 mmol/L — ABNORMAL LOW (ref 135–145)

## 2020-08-26 LAB — GLUCOSE, CAPILLARY: Glucose-Capillary: 135 mg/dL — ABNORMAL HIGH (ref 70–99)

## 2020-08-26 LAB — MAGNESIUM: Magnesium: 1.8 mg/dL (ref 1.7–2.4)

## 2020-08-26 MED ORDER — ALUM & MAG HYDROXIDE-SIMETH 200-200-20 MG/5ML PO SUSP
30.0000 mL | ORAL | Status: DC | PRN
  Administered 2020-08-26: 30 mL via ORAL
  Filled 2020-08-26: qty 30

## 2020-08-26 MED ORDER — PANTOPRAZOLE SODIUM 40 MG PO TBEC
40.0000 mg | DELAYED_RELEASE_TABLET | Freq: Every day | ORAL | Status: DC
  Administered 2020-08-27: 40 mg via ORAL
  Filled 2020-08-26: qty 1

## 2020-08-26 NOTE — Evaluation (Signed)
Physical Therapy Evaluation Patient Details Name: Jon Bennett MRN: [id number] DOB: 1935/02/26 Today's Date: 08/26/2020   History of Present Illness  Jon Bennett is a 86 y.o. male with medical history significant for hypertension, diabetes mellitus, splenic vein thrombosis, alcohol withdrawal.  My evaluation, patient initially sleepy, but arouses easily to voice, and able to answer questions appropriately.  He tells he has been vomiting over the past 2 days, but denies diarrhea, no abdominal pain.  Patient also reported some burning chest pain.  He tells me he used to drink a lot, but has cut back, but he still sneaks a drinking and beer.  He tells me his last drink was this morning, he had a beer and a shot of alcoholic beverages unable to tell me what exactly.  He denies seizures.  - EDP reports that patient had cataract surgery this past Tuesday, so he had cut back on his alcohol.   Clinical Impression  Patient demonstrates slow labored movement for sitting up at bedside, unsteady on feet having to lean on nearby objects for support when not using an AD, required use of RW for safety demonstrating slow labored cadence without loss of balance, limited mostly due to c/o fatigue.  Patient will benefit from continued physical therapy in hospital and recommended venue below to increase strength, balance, endurance for safe ADLs and gait.      Follow Up Recommendations SNF;Supervision for mobility/OOB;Supervision - Intermittent    Equipment Recommendations  None recommended by PT    Recommendations for Other Services       Precautions / Restrictions Precautions Precautions: Fall Restrictions Weight Bearing Restrictions: No      Mobility  Bed Mobility Overal bed mobility: Needs Assistance Bed Mobility: Supine to Sit     Supine to sit: Min assist     General bed mobility comments: increased time, labored movement    Transfers Overall transfer level: Needs  assistance Equipment used: Rolling walker (2 wheeled) Transfers: Sit to/from [employer] Sit to Stand: Min assist Stand pivot transfers: Min assist       General transfer comment: unsteady on feet having to lean on nearby objects for support without AD, required use of RW for safety  Ambulation/Gait Ambulation/Gait assistance: Min assist Gait Distance (Feet): 40 Feet Assistive device: Rolling walker (2 wheeled) Gait Pattern/deviations: Decreased step length - right;Decreased step length - left;Decreased stride length Gait velocity: decreased   General Gait Details: slightly labored cadence without loss of balance using RW, limited mostly due to fatigue  Stairs            Wheelchair Mobility    Modified Rankin (Stroke Patients Only)       Balance Overall balance assessment: Needs assistance Sitting-balance support: Feet supported;No upper extremity supported Sitting balance-Leahy Scale: Fair Sitting balance - Comments: fair/good seated at EOB   Standing balance support: During functional activity;No upper extremity supported Standing balance-Leahy Scale: Poor Standing balance comment: fair/good using RW                             Pertinent Vitals/Pain Pain Assessment: No/denies pain    Home Living Family/patient expects to be discharged to:: Private residence Living Arrangements: Children Available Help at Discharge: Family;Available 24 hours/day Type of Home: House Home Access: Stairs to enter;Ramped entrance Entrance Stairs-Rails: Left Entrance Stairs-Number of Steps: 3 Home Layout: Able to live on main level with bedroom/bathroom;Full bath on main level;Two level Home Equipment:  Wheelchair - manual;Cane - single point;Walker - 2 wheels      Prior Function Level of Independence: Independent with assistive device(s)         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[employer], drives     Hand Dominance        Extremity/Trunk  Assessment   Upper Extremity Assessment Upper Extremity Assessment: Generalized weakness    Lower Extremity Assessment Lower Extremity Assessment: Generalized weakness    Cervical / Trunk Assessment Cervical / Trunk Assessment: Kyphotic  Communication   Communication: No difficulties  Cognition Arousal/Alertness: Awake/alert Behavior During Therapy: WFL for tasks assessed/performed Overall Cognitive Status: Within Functional Limits for tasks assessed                                        General Comments      Exercises     Assessment/Plan    PT Assessment Patient needs continued PT services  PT Problem List Decreased strength;Decreased activity tolerance;Decreased balance;Decreased mobility       PT Treatment Interventions DME instruction;Gait training;Stair training;Functional mobility training;Therapeutic activities;Therapeutic exercise;Patient/family education;Balance training    PT Goals (Current goals can be found in the Care Plan section)  Acute Rehab PT Goals Patient Stated Goal: return home with family to assist PT Goal Formulation: With patient Time For Goal Achievement: 09/09/20 Potential to Achieve Goals: Good    Frequency Min 3X/week   Barriers to discharge        Co-evaluation               AM-PAC PT "6 Clicks" Mobility  Outcome Measure Help needed turning from your back to your side while in a flat bed without using bedrails?: A Little Help needed moving from lying on your back to sitting on the side of a flat bed without using bedrails?: A Little Help needed moving to and from a bed to a chair (including a wheelchair)?: A Little Help needed standing up from a chair using your arms (e.g., wheelchair or bedside chair)?: A Little Help needed to walk in hospital room?: A Little Help needed climbing 3-5 steps with a railing? : A Lot 6 Click Score: 17    End of Session   Activity Tolerance: Patient tolerated treatment  well;Patient limited by fatigue Patient left: in chair;with call bell/phone within reach;with chair alarm set Nurse Communication: Mobility status PT Visit Diagnosis: Unsteadiness on feet (R26.81);Other abnormalities of gait and mobility (R26.89);Muscle weakness (generalized) (M62.81)    Time: 1041-1110 PT Time Calculation (min) (ACUTE ONLY): 29 min   Charges:   PT Evaluation $PT Eval Moderate Complexity: 1 Mod PT Treatments $Therapeutic Activity: 23-37 mins        12:32 PM, 08/26/20 Ocie Bob, MPT Physical Therapist with [facility] [phone] office [phone] mobile phone

## 2020-08-26 NOTE — Plan of Care (Signed)
  Problem: Acute Rehab PT Goals(only PT should resolve) Goal: Pt Will Go Supine/Side To Sit Outcome: Progressing Flowsheets (Taken 08/26/2020 1234) Pt will go Supine/Side to Sit:  with min guard assist  with supervision Goal: Patient Will Transfer Sit To/From Stand Outcome: Progressing Flowsheets (Taken 08/26/2020 1234) Patient will transfer sit to/from stand:  with supervision  with min guard assist Goal: Pt Will Transfer Bed To Chair/Chair To Bed Outcome: Progressing Flowsheets (Taken 08/26/2020 1234) Pt will Transfer Bed to Chair/Chair to Bed:  with supervision  min guard assist Goal: Pt Will Ambulate Outcome: Progressing Flowsheets (Taken 08/26/2020 1234) Pt will Ambulate:  75 feet  with supervision  with min guard assist  with rolling walker   12:35 PM, 08/26/20 Ocie Bob, MPT Physical Therapist with [facility] [phone] office [phone] mobile phone

## 2020-08-26 NOTE — NC FL2 (Signed)
[place] MEDICAID FL2 LEVEL OF CARE SCREENING TOOL     IDENTIFICATION  Patient Name: Jon Bennett Birthdate: 12-09-1934 Sex: male Admission Date (Current Location): 08/24/2020  [facility] and [place] Number:  [employer] and Address:  [facility],  [address] [place] [address]      Provider Number: [id number]  Attending Physician Name and Address:  Erick Blinks, DO  Relative Name and Phone Number:  Cuyler Vandyken - son [phone]    Current Level of Care: Hospital Recommended Level of Care: Skilled Nursing Facility Prior Approval Number:    Date Approved/Denied:   PASRR Number: [id number]  Discharge Plan: SNF    Current Diagnoses: Patient Active Problem List   Diagnosis Date Noted   Hyponatremia 08/24/2020   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   Dyskinesia    Absolute anemia    Akathisia 06/29/2019   Nausea and vomiting 06/29/2019   Acathisia 06/29/2019   Acute hyperactive alcohol withdrawal delirium (HCC) 04/24/2019   Type 2 diabetes mellitus (HCC)    Hypertension    GERD (gastroesophageal reflux disease)    Thrombocytopenia (HCC) 08/16/2018   Alcohol withdrawal (HCC) 08/13/2018    Orientation RESPIRATION BLADDER Height & Weight     Self, Time, Situation, Place  Normal External catheter Weight: 65.8 kg Height:  5\' 9"  (175.3 cm)  BEHAVIORAL SYMPTOMS/MOOD NEUROLOGICAL BOWEL NUTRITION STATUS      Continent Diet (See DC Summary)  AMBULATORY STATUS COMMUNICATION OF NEEDS Skin     Verbally Normal                       Personal Care Assistance Level of Assistance  Bathing, Feeding, Dressing Bathing Assistance: Maximum assistance Feeding assistance: Independent Dressing Assistance: Maximum assistance     Functional Limitations Info  Sight, Hearing, Speech Sight Info: Adequate Hearing Info: Impaired Speech Info: Adequate    SPECIAL CARE FACTORS FREQUENCY  PT (By licensed PT)     PT Frequency: 5 times a week               Contractures Contractures Info: Not present    Additional Factors Info  Code Status, Allergies Code Status Info: Full Allergies Info: aspirin           Current Medications (08/26/2020):  This is the current hospital active medication list Current Facility-Administered Medications  Medication Dose Route Frequency Provider Last Rate Last Admin   acetaminophen (TYLENOL) tablet 650 mg  650 mg Oral Q6H PRN Emokpae, Ejiroghene E, MD   650 mg at 08/26/20 1147   Or   acetaminophen (TYLENOL) suppository 650 mg  650 mg Rectal Q6H PRN Emokpae, Ejiroghene E, MD       apixaban (ELIQUIS) tablet 5 mg  5 mg Oral BID Emokpae, Ejiroghene E, MD   5 mg at 08/26/20 0816   busPIRone (BUSPAR) tablet 10 mg  10 mg Oral BID Emokpae, Ejiroghene E, MD   10 mg at 08/26/20 0815   doxepin (SINEQUAN) capsule 25 mg  25 mg Oral QHS Emokpae, Ejiroghene E, MD   25 mg at 08/25/20 2216   folic acid (FOLVITE) tablet 1 mg  1 mg Oral Daily Emokpae, Ejiroghene E, MD   1 mg at 08/26/20 0815   LORazepam (ATIVAN) injection 0-4 mg  0-4 mg Intravenous Q6H Emokpae, Ejiroghene E, MD   2 mg at 08/25/20 0558   Followed by   LORazepam (ATIVAN) injection 0-4 mg  0-4 mg Intravenous Q12H Emokpae, Ejiroghene E,  MD       LORazepam (ATIVAN) tablet 1-4 mg  1-4 mg Oral Q1H PRN Emokpae, Ejiroghene E, MD       Or   LORazepam (ATIVAN) injection 1-4 mg  1-4 mg Intravenous Q1H PRN Emokpae, Ejiroghene E, MD   2 mg at 08/24/20 2159   metoprolol tartrate (LOPRESSOR) tablet 25 mg  25 mg Oral BID Emokpae, Ejiroghene E, MD   25 mg at 08/25/20 2215   multivitamin with minerals tablet 1 tablet  1 tablet Oral Daily Emokpae, Ejiroghene E, MD   1 tablet at 08/26/20 0815   ondansetron (ZOFRAN) tablet 4 mg  4 mg Oral Q6H PRN Emokpae, Ejiroghene E, MD       Or   ondansetron (ZOFRAN) injection 4 mg  4 mg Intravenous Q6H PRN Emokpae, Ejiroghene E, MD   4 mg at 08/24/20 2135   pantoprazole (PROTONIX) injection 40 mg  40 mg Intravenous Q24H Emokpae,  Ejiroghene E, MD   40 mg at 08/25/20 1804   polyethylene glycol (MIRALAX / GLYCOLAX) packet 17 g  17 g Oral Daily PRN Emokpae, Ejiroghene E, MD       thiamine tablet 100 mg  100 mg Oral Daily Emokpae, Ejiroghene E, MD   100 mg at 08/26/20 0815   Or   thiamine (B-1) injection 100 mg  100 mg Intravenous Daily Emokpae, Ejiroghene E, MD         Discharge Medications: Please see discharge summary for a list of discharge medications.  Relevant Imaging Results:  Relevant Lab Results:   Additional Information SS# [id number]  Leitha Bleak, RN

## 2020-08-26 NOTE — TOC Progression Note (Signed)
Transition of Care [facility]) - Progression Note    Patient Details  Name: Jon Bennett MRN: [id number] Date of Birth: 06/13/1934  [facility]) CM/SW Contact  Leitha Bleak, RN Phone Number: 08/26/2020, 1:10 PM  Clinical Narrative:   PT is recommending SNF. Patient  and son, [name] is agreeing. PNC first choice, patient is vaccinated for COVID x3. TOC sent FL2 out for bed offers. Patient is medically ready.     Expected Discharge Plan: Skilled Nursing Facility Barriers to Discharge: SNF Pending bed offer  Expected Discharge Plan and Services Expected Discharge Plan: Skilled Nursing Facility In-house Referral: Clinical Social Work Discharge Planning Services: CM Consult Post Acute Care Choice: NA Living arrangements for the past 2 months: Single Family Home                 DME Arranged: N/A DME Agency: NA    HH Arranged: NA HH Agency: NA      Readmission Risk Interventions No flowsheet data found.

## 2020-08-26 NOTE — Progress Notes (Signed)
PROGRESS NOTE    Jon Bennett  UMP:[id number] DOB: 31-Dec-1934 DOA: 08/24/2020 PCP: [name], MD   Brief Narrative:   Jon Bennett is a 86 y.o. male with medical history significant for hypertension, diabetes mellitus, splenic vein thrombosis, alcohol withdrawal.  Patient states that he normally drinks quite a bit of alcohol, but has recently cut back due to his cataract surgery this past Tuesday.  He was admitted with hyponatremia and alcohol withdrawal symptoms. He will need PT evaluation and placement to SNF.  Assessment & Plan:   Principal Problem:   Hyponatremia Active Problems:   Alcohol withdrawal ([name]   Splenic vein thrombosis   Alcohol withdrawal-resolved -Drinks 3-4 hard liquor drinks per day according to his son -Continue on CIWA protocol -Check electrolyte electrolytes in a.m.   Intractable nausea and vomiting-resolved -Abdominal exam has been benign -Advance diet to heart healthy   Hypoosmolar hyponatremia-improved -Related to chronic alcohol use as well as some dehydration -Currently improved, recheck in a.m.   Chest pain -Troponins and EKG within normal limits   Diabetes mellitus -Blood glucose currently stable, not on medication   Hypertension-stable   Splenic vein thrombosis -Continue Eliquis   DVT prophylaxis:Eliquis Code Status: Full Family Communication: Discussed with son on phone 6/14 Disposition Plan:  Status is: Inpatient  Remains inpatient appropriate because:Unsafe d/c plan  Dispo: The patient is from: Home              Anticipated d/c is to: SNF              Patient currently is medically stable to d/c.   Difficult to place patient No   Consultants:  None   Procedures:  See below   Antimicrobials:  None   Subjective: Patient seen and evaluated today with no new acute complaints or concerns. No acute concerns or events noted overnight.  Objective: Vitals:    08/25/20 2039 08/26/20 0541 08/26/20 0815 08/26/20 1329  BP: 108/80 (!) 113/55 (!) 99/49 111/72  Pulse: 87 (!) 102 89 80  Resp: 15 20  18   Temp: 98.5 F (36.9 C) 99.8 F (37.7 C)  98.8 F (37.1 C)  TempSrc: Oral Oral  Oral  SpO2: 100% 98%  97%  Weight:      Height:        Intake/Output Summary (Last 24 hours) at 08/26/2020 1430 Last data filed at 08/26/2020 1300 Gross per 24 hour  Intake 840 ml  Output 1100 ml  Net -260 ml   Filed Weights   08/24/20 1118  Weight: 65.8 kg    Examination:  General exam: Appears calm and comfortable  Respiratory system: Clear to auscultation. Respiratory effort normal. Cardiovascular system: S1 & S2 heard, RRR.  Gastrointestinal system: Abdomen is soft Central nervous system: Alert and awake Extremities: No edema Skin: No significant lesions noted Psychiatry: Flat affect.    Data Reviewed: I have personally reviewed following labs and imaging studies  CBC: Recent Labs  Lab 08/24/20 1305 08/25/20 0433 08/26/20 0425  WBC 11.5* 9.8 7.3  NEUTROABS 9.9*  --   --   HGB 14.1 12.4* 13.5  HCT 39.8 36.4* 39.7  MCV 91.1 94.5 96.1  PLT 187 139* 133*   Basic Metabolic Panel: Recent Labs  Lab 08/24/20 1305 08/24/20 1458 08/25/20 0433 08/26/20 0425  NA 122*  --  133* 134*  K 3.5  --  4.4 3.7  CL 90*  --  101 100  CO2 23  --  29 27  GLUCOSE 145*  --  108* 115*  BUN 7*  --  7* 11  CREATININE 0.64  --  0.62 0.76  CALCIUM 9.0  --  8.8* 9.0  MG  --  1.5*  --  1.8  PHOS  --  1.9*  --   --    GFR: Estimated Creatinine Clearance: 65.1 mL/min (by C-G formula based on SCr of 0.76 mg/dL). Liver Function Tests: Recent Labs  Lab 08/24/20 1305  AST 26  ALT 14  ALKPHOS 86  BILITOT 1.1  PROT 6.7  ALBUMIN 3.7   Recent Labs  Lab 08/24/20 1305  LIPASE 25   No results for input(s): AMMONIA in the last 168 hours. Coagulation Profile: No results for input(s): INR, PROTIME in the last 168 hours. Cardiac Enzymes: No results for  input(s): CKTOTAL, CKMB, CKMBINDEX, TROPONINI in the last 168 hours. BNP (last 3 results) No results for input(s): PROBNP in the last 8760 hours. HbA1C: No results for input(s): HGBA1C in the last 72 hours. CBG: Recent Labs  Lab 08/25/20 0658 08/26/20 0726  GLUCAP 111* 135*   Lipid Profile: No results for input(s): CHOL, HDL, LDLCALC, TRIG, CHOLHDL, LDLDIRECT in the last 72 hours. Thyroid Function Tests: Recent Labs    08/24/20 1314  TSH 0.542   Anemia Panel: No results for input(s): VITAMINB12, FOLATE, FERRITIN, TIBC, IRON, RETICCTPCT in the last 72 hours. Sepsis Labs: No results for input(s): PROCALCITON, LATICACIDVEN in the last 168 hours.  Recent Results (from the past 240 hour(s))  SARS CORONAVIRUS 2 (TAT 6-24 HRS) Nasopharyngeal Nasopharyngeal Swab     Status: None   Collection Time: 08/24/20  2:34 PM   Specimen: Nasopharyngeal Swab  Result Value Ref Range Status   SARS Coronavirus 2 NEGATIVE NEGATIVE Final    Comment: (NOTE) SARS-CoV-2 target nucleic acids are NOT DETECTED.  The SARS-CoV-2 RNA is generally detectable in upper and lower respiratory specimens during the acute phase of infection. Negative results do not preclude SARS-CoV-2 infection, do not rule out co-infections with other pathogens, and should not be used as the sole basis for treatment or other patient management decisions. Negative results must be combined with clinical observations, patient history, and epidemiological information. The expected result is Negative.  Fact Sheet for Patients: HairSlick.no  Fact Sheet for Healthcare Providers: quierodirigir.com  This test is not yet approved or cleared by the [place] FDA and  has been authorized for detection and/or diagnosis of SARS-CoV-2 by FDA under an Emergency Use Authorization (EUA). This EUA will remain  in effect (meaning this test can be used) for the duration of the COVID-19  declaration under Se ction 564(b)(1) of the Act, 21 U.S.C. section 360bbb-3(b)(1), unless the authorization is terminated or revoked sooner.  Performed at [facility], [address], [place], [place] [address]          Radiology Studies: No results found.      Scheduled Meds:  apixaban  5 mg Oral BID   busPIRone  10 mg Oral BID   doxepin  25 mg Oral QHS   folic acid  1 mg Oral Daily   LORazepam  0-4 mg Intravenous Q6H   Followed by   LORazepam  0-4 mg Intravenous Q12H   metoprolol tartrate  25 mg Oral BID   multivitamin with minerals  1 tablet Oral Daily   pantoprazole (PROTONIX) IV  40 mg Intravenous Q24H   thiamine  100 mg Oral Daily  Or   thiamine  100 mg Intravenous Daily     LOS: 0 days    Time spent: 35 minutes    Jenene Kauffmann Hoover Brunette, DO [facility]  If 7PM-7AM, please contact night-coverage [email or web address] 08/26/2020, 2:30 PM

## 2020-08-27 DIAGNOSIS — I10 Essential (primary) hypertension: Secondary | ICD-10-CM | POA: Diagnosis not present

## 2020-08-27 DIAGNOSIS — R2689 Other abnormalities of gait and mobility: Secondary | ICD-10-CM | POA: Diagnosis not present

## 2020-08-27 DIAGNOSIS — R531 Weakness: Secondary | ICD-10-CM | POA: Diagnosis not present

## 2020-08-27 DIAGNOSIS — Z86718 Personal history of other venous thrombosis and embolism: Secondary | ICD-10-CM | POA: Diagnosis not present

## 2020-08-27 DIAGNOSIS — F10239 Alcohol dependence with withdrawal, unspecified: Secondary | ICD-10-CM | POA: Diagnosis not present

## 2020-08-27 DIAGNOSIS — Z7901 Long term (current) use of anticoagulants: Secondary | ICD-10-CM | POA: Diagnosis not present

## 2020-08-27 DIAGNOSIS — R41841 Cognitive communication deficit: Secondary | ICD-10-CM | POA: Diagnosis not present

## 2020-08-27 DIAGNOSIS — E119 Type 2 diabetes mellitus without complications: Secondary | ICD-10-CM | POA: Diagnosis not present

## 2020-08-27 DIAGNOSIS — Z9181 History of falling: Secondary | ICD-10-CM | POA: Diagnosis not present

## 2020-08-27 DIAGNOSIS — R2681 Unsteadiness on feet: Secondary | ICD-10-CM | POA: Diagnosis not present

## 2020-08-27 DIAGNOSIS — Z20822 Contact with and (suspected) exposure to covid-19: Secondary | ICD-10-CM | POA: Diagnosis not present

## 2020-08-27 DIAGNOSIS — F10231 Alcohol dependence with withdrawal delirium: Secondary | ICD-10-CM | POA: Diagnosis not present

## 2020-08-27 DIAGNOSIS — I82890 Acute embolism and thrombosis of other specified veins: Secondary | ICD-10-CM | POA: Diagnosis not present

## 2020-08-27 DIAGNOSIS — E871 Hypo-osmolality and hyponatremia: Secondary | ICD-10-CM | POA: Diagnosis not present

## 2020-08-27 DIAGNOSIS — F418 Other specified anxiety disorders: Secondary | ICD-10-CM | POA: Diagnosis not present

## 2020-08-27 DIAGNOSIS — M6281 Muscle weakness (generalized): Secondary | ICD-10-CM | POA: Diagnosis not present

## 2020-08-27 DIAGNOSIS — E860 Dehydration: Secondary | ICD-10-CM | POA: Diagnosis not present

## 2020-08-27 LAB — GLUCOSE, CAPILLARY
Glucose-Capillary: 123 mg/dL — ABNORMAL HIGH (ref 70–99)
Glucose-Capillary: 118 mg/dL — ABNORMAL HIGH (ref 70–99)

## 2020-08-27 LAB — BASIC METABOLIC PANEL
Anion gap: 6 (ref 5–15)
BUN: 16 mg/dL (ref 8–23)
CO2: 30 mmol/L (ref 22–32)
Calcium: 8.6 mg/dL — ABNORMAL LOW (ref 8.9–10.3)
Chloride: 99 mmol/L (ref 98–111)
Creatinine, Ser: 0.75 mg/dL (ref 0.61–1.24)
GFR, Estimated: >60 mL/min (ref >60)
Glucose, Bld: 121 mg/dL — ABNORMAL HIGH (ref 70–99)
Potassium: 3.8 mmol/L (ref 3.5–5.1)
Sodium: 135 mmol/L (ref 135–145)

## 2020-08-27 LAB — RESP PANEL BY RT-PCR (FLU A&B, COVID) ARPGX2
Influenza A by PCR: NEGATIVE
Influenza B by PCR: NEGATIVE
SARS Coronavirus 2 by RT PCR: NEGATIVE

## 2020-08-27 MED ORDER — BUSPIRONE HCL 10 MG PO TABS: 10.0000 mg | ORAL_TABLET | Freq: Two times a day (BID) | ORAL | 0 refills | Status: DC

## 2020-08-27 MED ORDER — HYDROCODONE-ACETAMINOPHEN 5-325 MG PO TABS: 2.0000 | ORAL_TABLET | ORAL | 0 refills | Status: DC | PRN

## 2020-08-27 MED ORDER — THIAMINE HCL 100 MG PO TABS: 100.0000 mg | ORAL_TABLET | Freq: Every day | ORAL | 0 refills | Status: AC

## 2020-08-27 NOTE — Progress Notes (Signed)
Report called to [facility]  

## 2020-08-27 NOTE — Discharge Summary (Signed)
Physician Discharge Summary  Jon Bennett UUE:[id number] DOB: 1934-11-12 DOA: 08/24/2020  PCP: [name], MD  Admit date: 08/24/2020  Discharge date: 08/27/2020  Admitted From:Home  Disposition:  SNF  Recommendations for Outpatient Follow-up:  Follow up with PCP in 1-2 weeks, recheck BMP in 1 week to reassess sodium levels Continue on medications as noted below Counseled on alcohol cessation  Home Health: None  Equipment/Devices: None  Discharge Condition:Stable  CODE STATUS: Full  Diet recommendation: Heart Healthy/carb modified  Brief/Interim Summary: Jon Bennett is a 86 y.o. male with medical history significant for hypertension, diabetes mellitus, splenic vein thrombosis, and recurrent alcohol withdrawal.  Patient stated that he normally drinks quite a bit of alcohol, but had recently cut back due to his cataract surgery this past week.  He was admitted with hyponatremia and alcohol withdrawal symptoms and was closely monitored during this brief course of stay and had improvement in his sodium levels after IV fluid administration.  He did not have any significant agitation or tremors to suggest severe withdrawal symptoms, but was noted to be quite weak and somnolent.  He was assessed by PT with recommendation for SNF/rehab on discharge which he and his family is agreeable to.  He is currently in stable condition for discharge to SNF and is agreeable to alcohol counseling in the outpatient setting.  Discharge Diagnoses:  Principal Problem:   Hyponatremia Active Problems:   Alcohol withdrawal ([name]   Splenic vein thrombosis  Principal discharge diagnosis: Alcohol withdrawal symptoms along with hypoosmolar hyponatremia related to chronic alcohol use.  Discharge Instructions  Discharge Instructions     Diet - low sodium heart healthy   Complete by: As directed    Increase activity slowly   Complete by: As directed        Allergies as of 08/27/2020       Reactions   Asa [aspirin] Shortness Of Breath        Medication List     STOP taking these medications    baclofen 10 MG tablet Commonly known as: LIORESAL   Difluprednate 0.05 % Emul   ondansetron 4 MG tablet Commonly known as: ZOFRAN   pantoprazole 40 MG tablet Commonly known as: PROTONIX   Prolensa 0.07 % Soln Generic drug: Bromfenac Sodium       TAKE these medications    acetaminophen 500 MG tablet Commonly known as: TYLENOL Take 500 mg by mouth daily as needed for mild pain or moderate pain.   Besivance 0.6 % Susp Generic drug: Besifloxacin HCl Place 1 drop into the left eye 3 (three) times daily.   busPIRone 10 MG tablet Commonly known as: BUSPAR Take 1 tablet (10 mg total) by mouth 2 (two) times daily.   doxepin 25 MG capsule Commonly known as: SINEQUAN Take 25 mg by mouth at bedtime.   Eliquis 5 MG Tabs tablet Generic drug: apixaban TAKE 1 TABLET BY MOUTH TWICE A DAY What changed: Another medication with the same name was removed. Continue taking this medication, and follow the directions you see here.   famotidine 20 MG tablet Commonly known as: PEPCID Take 1 tablet (20 mg total) by mouth at bedtime.   HYDROcodone-acetaminophen 5-325 MG tablet Commonly known as: NORCO/VICODIN Take 2 tablets by mouth every 4 (four) hours as needed. What changed: Another medication with the same name was removed. Continue taking this medication, and follow the directions you see here.   metoprolol tartrate  25 MG tablet Commonly known as: LOPRESSOR Take 1 tablet (25 mg total) by mouth 2 (two) times daily.   ondansetron 8 MG disintegrating tablet Commonly known as: Zofran ODT Take 1 tablet (8 mg total) by mouth every 8 (eight) hours as needed for nausea or vomiting.   sertraline 100 MG tablet Commonly known as: ZOLOFT Take 100 mg by mouth daily.   thiamine 100 MG tablet Take 1 tablet (100 mg total) by mouth  daily. Start taking on: August 28, 2020   vitamin B-12 1000 MCG tablet Commonly known as: CYANOCOBALAMIN Take 1 tablet (1,000 mcg total) by mouth daily.   Vitamin D (Ergocalciferol) 1.25 MG (50000 UNIT) Caps capsule Commonly known as: DRISDOL Take 50,000 Units by mouth every 7 (seven) days.        Contact information for after-discharge care     Destination     [facility] Preferred SNF .   Service: Skilled Nursing Contact information: [address] [place] [address] [phone]                    Allergies  Allergen Reactions   Jonne Ply [Aspirin] Shortness Of Breath    Consultations: None   Procedures/Studies: CT Head Wo Contrast  Result Date: 08/06/2020 CLINICAL DATA:  Dizziness and vomiting for 1 day EXAM: CT HEAD WITHOUT CONTRAST TECHNIQUE: Contiguous axial images were obtained from the base of the skull through the vertex without intravenous contrast. COMPARISON:  04/21/2020 FINDINGS: Brain: No evidence of acute infarction, hemorrhage, hydrocephalus, extra-axial collection or mass lesion/mass effect. Chronic atrophic and ischemic changes are again identified and stable. Vascular: No hyperdense vessel or unexpected calcification. Skull: Normal. Negative for fracture or focal lesion. Sinuses/Orbits: No acute finding. Other: None. IMPRESSION: Chronic atrophic and ischemic changes similar to that seen on prior exam. Electronically Signed   By: Alcide Clever M.D.   On: 08/06/2020 11:17   DG Chest Port 1 View  Result Date: 08/24/2020 CLINICAL DATA:  Chest pain and vomiting. EXAM: PORTABLE CHEST 1 VIEW COMPARISON:  04/21/2020 FINDINGS: The cardiac silhouette, mediastinal and hilar contours are within normal limits and stable. There are chronic bronchitic type interstitial lung changes but no infiltrates or effusions. Stable hiatal hernia. The bony thorax is intact. IMPRESSION: Chronic lung changes but no acute  pulmonary findings. Electronically Signed   By: Rudie Meyer M.D.   On: 08/24/2020 13:14     Discharge Exam: Vitals:   08/26/20 2059 08/27/20 0527  BP: 111/73 (!) 130/52  Pulse: 96 82  Resp: 18 17  Temp: 98.6 F (37 C) 98.2 F (36.8 C)  SpO2: 96% 99%   Vitals:   08/26/20 0815 08/26/20 1329 08/26/20 2059 08/27/20 0527  BP: (!) 99/49 111/72 111/73 (!) 130/52  Pulse: 89 80 96 82  Resp:  18 18 17   Temp:  98.8 F (37.1 C) 98.6 F (37 C) 98.2 F (36.8 C)  TempSrc:  Oral Oral Oral  SpO2:  97% 96% 99%  Weight:      Height:        General: Pt is alert, awake, not in acute distress Cardiovascular: RRR, S1/S2 +, no rubs, no gallops Respiratory: CTA bilaterally, no wheezing, no rhonchi Abdominal: Soft, NT, ND, bowel sounds + Extremities: no edema, no cyanosis    The results of significant diagnostics from this hospitalization (including imaging, microbiology, ancillary and laboratory) are listed below for reference.     Microbiology: Recent Results (from the past  240 hour(s))  SARS CORONAVIRUS 2 (TAT 6-24 HRS) Nasopharyngeal Nasopharyngeal Swab     Status: None   Collection Time: 08/24/20  2:34 PM   Specimen: Nasopharyngeal Swab  Result Value Ref Range Status   SARS Coronavirus 2 NEGATIVE NEGATIVE Final    Comment: (NOTE) SARS-CoV-2 target nucleic acids are NOT DETECTED.  The SARS-CoV-2 RNA is generally detectable in upper and lower respiratory specimens during the acute phase of infection. Negative results do not preclude SARS-CoV-2 infection, do not rule out co-infections with other pathogens, and should not be used as the sole basis for treatment or other patient management decisions. Negative results must be combined with clinical observations, patient history, and epidemiological information. The expected result is Negative.  Fact Sheet for Patients: HairSlick.no  Fact Sheet for Healthcare  Providers: quierodirigir.com  This test is not yet approved or cleared by the [place] FDA and  has been authorized for detection and/or diagnosis of SARS-CoV-2 by FDA under an Emergency Use Authorization (EUA). This EUA will remain  in effect (meaning this test can be used) for the duration of the COVID-19 declaration under Se ction 564(b)(1) of the Act, 21 U.S.C. section 360bbb-3(b)(1), unless the authorization is terminated or revoked sooner.  Performed at [facility], [address] [place], [place] [address]      Labs: BNP (last 3 results) No results for input(s): BNP in the last 8760 hours. Basic Metabolic Panel: Recent Labs  Lab 08/24/20 1305 08/24/20 1458 08/25/20 0433 08/26/20 0425 08/27/20 0410  NA 122*  --  133* 134* 135  K 3.5  --  4.4 3.7 3.8  CL 90*  --  101 100 99  CO2 23  --  29 27 30   GLUCOSE 145*  --  108* 115* 121*  BUN 7*  --  7* 11 16  CREATININE 0.64  --  0.62 0.76 0.75  CALCIUM 9.0  --  8.8* 9.0 8.6*  MG  --  1.5*  --  1.8  --   PHOS  --  1.9*  --   --   --    Liver Function Tests: Recent Labs  Lab 08/24/20 1305  AST 26  ALT 14  ALKPHOS 86  BILITOT 1.1  PROT 6.7  ALBUMIN 3.7   Recent Labs  Lab 08/24/20 1305  LIPASE 25   No results for input(s): AMMONIA in the last 168 hours. CBC: Recent Labs  Lab 08/24/20 1305 08/25/20 0433 08/26/20 0425  WBC 11.5* 9.8 7.3  NEUTROABS 9.9*  --   --   HGB 14.1 12.4* 13.5  HCT 39.8 36.4* 39.7  MCV 91.1 94.5 96.1  PLT 187 139* 133*   Cardiac Enzymes: No results for input(s): CKTOTAL, CKMB, CKMBINDEX, TROPONINI in the last 168 hours. BNP: Invalid input(s): POCBNP CBG: Recent Labs  Lab 08/25/20 0658 08/26/20 0726 08/27/20 0551 08/27/20 0724  GLUCAP 111* 135* 123* 118*   D-Dimer No results for input(s): DDIMER in the last 72 hours. Hgb A1c No results for input(s): HGBA1C in the last 72 hours. Lipid Profile No results for input(s): CHOL, HDL,  LDLCALC, TRIG, CHOLHDL, LDLDIRECT in the last 72 hours. Thyroid function studies Recent Labs    08/24/20 1314  TSH 0.542   Anemia work up No results for input(s): VITAMINB12, FOLATE, FERRITIN, TIBC, IRON, RETICCTPCT in the last 72 hours. Urinalysis    Component Value Date/Time   COLORURINE YELLOW 10/28/2019 0142   APPEARANCEUR HAZY (A) 10/28/2019 0142   LABSPEC 1.025 10/28/2019  0142   PHURINE 6.0 10/28/2019 0142   GLUCOSEU NEGATIVE 10/28/2019 0142   HGBUR NEGATIVE 10/28/2019 0142   BILIRUBINUR NEGATIVE 10/28/2019 0142   KETONESUR NEGATIVE 10/28/2019 0142   PROTEINUR NEGATIVE 10/28/2019 0142   NITRITE NEGATIVE 10/28/2019 0142   LEUKOCYTESUR NEGATIVE 10/28/2019 0142   Sepsis Labs Invalid input(s): PROCALCITONIN,  WBC,  LACTICIDVEN Microbiology Recent Results (from the past 240 hour(s))  SARS CORONAVIRUS 2 (TAT 6-24 HRS) Nasopharyngeal Nasopharyngeal Swab     Status: None   Collection Time: 08/24/20  2:34 PM   Specimen: Nasopharyngeal Swab  Result Value Ref Range Status   SARS Coronavirus 2 NEGATIVE NEGATIVE Final    Comment: (NOTE) SARS-CoV-2 target nucleic acids are NOT DETECTED.  The SARS-CoV-2 RNA is generally detectable in upper and lower respiratory specimens during the acute phase of infection. Negative results do not preclude SARS-CoV-2 infection, do not rule out co-infections with other pathogens, and should not be used as the sole basis for treatment or other patient management decisions. Negative results must be combined with clinical observations, patient history, and epidemiological information. The expected result is Negative.  Fact Sheet for Patients: HairSlick.nohttps://www.fda.gov/media/138098/download  Fact Sheet for Healthcare Providers: quierodirigir.comhttps://www.fda.gov/media/138095/download  This test is not yet approved or cleared by the [place] FDA and  has been authorized for detection and/or diagnosis of SARS-CoV-2 by FDA under an Emergency Use Authorization  (EUA). This EUA will remain  in effect (meaning this test can be used) for the duration of the COVID-19 declaration under Se ction 564(b)(1) of the Act, 21 U.S.C. section 360bbb-3(b)(1), unless the authorization is terminated or revoked sooner.  Performed at [facility], [address] [place], [place] [id number]      Time coordinating discharge: 35 minutes  SIGNED:   Erick BlinksPratik D Malahki Gasaway, DO [facility] 08/27/2020, 10:58 AM  If 7PM-7AM, please contact night-coverage [email or web address]

## 2020-08-27 NOTE — Progress Notes (Addendum)
Patient discharged from the unit accompanied by EMS, patient stable

## 2020-08-27 NOTE — TOC Transition Note (Signed)
Transition of Care [facility]) - CM/SW Discharge Note   Patient Details  Name: Jon Bennett MRN: [id number] Date of Birth: 12-27-1934  Transition of Care [facility]) CM/SW Contact:  Leitha Bleak, RN Phone Number: 08/27/2020, 11:35 AM   Clinical Narrative:   [name] received, [name]. RN called report, COVID neg, TOC updated [name], [name] and printed medical necessity.    Final next level of care: Skilled Nursing Facility Barriers to Discharge: Barriers Resolved   Patient Goals and CMS Choice Patient states their goals for this hospitalization and ongoing recovery are:: agreeing to SNF. CMS Medicare.gov Compare Post Acute Care list provided to:: Patient Choice offered to / list presented to : Patient  Discharge Placement              Patient chooses bed at: Other - please specify in the comment section below: ([facility]) Patient to be transferred to facility by: EMS Name of family member notified: [name] Patient and family notified of of transfer: 08/27/20  Discharge Plan and Services In-house Referral: Clinical Social Work Discharge Planning Services: CM Consult Post Acute Care Choice: NA          DME Arranged: N/A DME Agency: NA       HH Arranged: NA HH Agency: NA        Social Determinants of Health (SDOH) Interventions     Readmission Risk Interventions Readmission Risk Prevention Plan 08/27/2020  Transportation Screening Complete  PCP or Specialist Appt within 5-7 Days Complete  Home Care Screening Complete  Medication Review (RN CM) Complete

## 2020-08-28 ENCOUNTER — Ambulatory Visit (HOSPITAL_COMMUNITY): Payer: Medicare Other | Admitting: Hematology

## 2020-10-06 DIAGNOSIS — H524 Presbyopia: Secondary | ICD-10-CM | POA: Diagnosis not present

## 2020-10-09 DIAGNOSIS — F3342 Major depressive disorder, recurrent, in full remission: Secondary | ICD-10-CM | POA: Diagnosis not present

## 2020-10-09 DIAGNOSIS — Z6823 Body mass index (BMI) 23.0-23.9, adult: Secondary | ICD-10-CM | POA: Diagnosis not present

## 2020-10-09 DIAGNOSIS — K219 Gastro-esophageal reflux disease without esophagitis: Secondary | ICD-10-CM | POA: Diagnosis not present

## 2020-10-09 DIAGNOSIS — I10 Essential (primary) hypertension: Secondary | ICD-10-CM | POA: Diagnosis not present

## 2020-10-13 ENCOUNTER — Other Ambulatory Visit (HOSPITAL_COMMUNITY): Payer: Self-pay | Admitting: Hematology

## 2021-01-18 ENCOUNTER — Other Ambulatory Visit (HOSPITAL_COMMUNITY): Payer: Self-pay | Admitting: Hematology

## 2021-02-03 DIAGNOSIS — K219 Gastro-esophageal reflux disease without esophagitis: Secondary | ICD-10-CM | POA: Diagnosis not present

## 2021-02-03 DIAGNOSIS — I10 Essential (primary) hypertension: Secondary | ICD-10-CM | POA: Diagnosis not present

## 2021-02-03 DIAGNOSIS — Z6823 Body mass index (BMI) 23.0-23.9, adult: Secondary | ICD-10-CM | POA: Diagnosis not present

## 2021-02-03 DIAGNOSIS — F3342 Major depressive disorder, recurrent, in full remission: Secondary | ICD-10-CM | POA: Diagnosis not present

## 2021-05-20 ENCOUNTER — Other Ambulatory Visit (HOSPITAL_COMMUNITY): Payer: Self-pay | Admitting: Hematology

## 2021-06-04 ENCOUNTER — Encounter (HOSPITAL_COMMUNITY): Payer: Self-pay

## 2021-06-04 ENCOUNTER — Other Ambulatory Visit: Payer: Self-pay

## 2021-06-04 ENCOUNTER — Emergency Department (HOSPITAL_COMMUNITY)
Admission: EM | Admit: 2021-06-04 | Discharge: 2021-06-04 | Disposition: A | Discharge status: Home / Self Care | Payer: Medicare Other | Attending: Emergency Medicine | Admitting: Emergency Medicine

## 2021-06-04 DIAGNOSIS — K2920 Alcoholic gastritis without bleeding: Secondary | ICD-10-CM | POA: Insufficient documentation

## 2021-06-04 DIAGNOSIS — R748 Abnormal levels of other serum enzymes: Secondary | ICD-10-CM | POA: Insufficient documentation

## 2021-06-04 DIAGNOSIS — F1090 Alcohol use, unspecified, uncomplicated: Secondary | ICD-10-CM | POA: Diagnosis not present

## 2021-06-04 DIAGNOSIS — R000 Tachycardia, unspecified: Secondary | ICD-10-CM | POA: Diagnosis not present

## 2021-06-04 DIAGNOSIS — Y900 Blood alcohol level of less than 20 mg/100 ml: Secondary | ICD-10-CM | POA: Insufficient documentation

## 2021-06-04 DIAGNOSIS — D696 Thrombocytopenia, unspecified: Secondary | ICD-10-CM | POA: Diagnosis not present

## 2021-06-04 DIAGNOSIS — R1110 Vomiting, unspecified: Secondary | ICD-10-CM | POA: Diagnosis present

## 2021-06-04 DIAGNOSIS — D72829 Elevated white blood cell count, unspecified: Secondary | ICD-10-CM | POA: Insufficient documentation

## 2021-06-04 LAB — CBC WITH DIFFERENTIAL/PLATELET
Abs Immature Granulocytes: 0.06 10*3/uL (ref 0.00–0.07)
Basophils Absolute: 0 10*3/uL (ref 0.0–0.1)
Basophils Relative: 0 %
Eosinophils Absolute: 0.2 10*3/uL (ref 0.0–0.5)
Eosinophils Relative: 2 %
HCT: 39.1 % (ref 39.0–52.0)
Hemoglobin: 13.6 g/dL (ref 13.0–17.0)
Immature Granulocytes: 1 %
Lymphocytes Relative: 7 %
Lymphs Abs: 0.7 10*3/uL (ref 0.7–4.0)
MCH: 31.2 pg (ref 26.0–34.0)
MCHC: 34.8 g/dL (ref 30.0–36.0)
MCV: 89.7 fL (ref 80.0–100.0)
Monocytes Absolute: 0.5 10*3/uL (ref 0.1–1.0)
Monocytes Relative: 4 %
Neutro Abs: 9.3 10*3/uL — ABNORMAL HIGH (ref 1.7–7.7)
Neutrophils Relative %: 86 %
Platelets: 92 10*3/uL — ABNORMAL LOW (ref 150–400)
RBC: 4.36 MIL/uL (ref 4.22–5.81)
RDW: 13.4 % (ref 11.5–15.5)
WBC: 10.8 10*3/uL — ABNORMAL HIGH (ref 4.0–10.5)
nRBC: 0 % (ref 0.0–0.2)

## 2021-06-04 LAB — LIPASE, BLOOD: Lipase: 22 U/L (ref 11–51)

## 2021-06-04 LAB — ETHANOL: Alcohol, Ethyl (B): <10 mg/dL (ref <10)

## 2021-06-04 LAB — COMPREHENSIVE METABOLIC PANEL
ALT: 79 U/L — ABNORMAL HIGH (ref 0–44)
AST: 120 U/L — ABNORMAL HIGH (ref 15–41)
Albumin: 3.9 g/dL (ref 3.5–5.0)
Alkaline Phosphatase: 200 U/L — ABNORMAL HIGH (ref 38–126)
Anion gap: 12 (ref 5–15)
BUN: 19 mg/dL (ref 8–23)
CO2: 22 mmol/L (ref 22–32)
Calcium: 11.1 mg/dL — ABNORMAL HIGH (ref 8.9–10.3)
Chloride: 102 mmol/L (ref 98–111)
Creatinine, Ser: 0.96 mg/dL (ref 0.61–1.24)
GFR, Estimated: >60 mL/min (ref >60)
Glucose, Bld: 164 mg/dL — ABNORMAL HIGH (ref 70–99)
Potassium: 3.3 mmol/L — ABNORMAL LOW (ref 3.5–5.1)
Sodium: 136 mmol/L (ref 135–145)
Total Bilirubin: 6.1 mg/dL — ABNORMAL HIGH (ref 0.3–1.2)
Total Protein: 7.3 g/dL (ref 6.5–8.1)

## 2021-06-04 MED ORDER — PANTOPRAZOLE SODIUM 40 MG IV SOLR
40.0000 mg | Freq: Once | INTRAVENOUS | Status: AC
Start: 2021-06-04 — End: 2021-06-04
  Administered 2021-06-04: 40 mg via INTRAVENOUS
  Filled 2021-06-04: qty 10

## 2021-06-04 MED ORDER — LACTATED RINGERS IV BOLUS
1000.0000 mL | Freq: Once | INTRAVENOUS | Status: AC
  Administered 2021-06-04: 1000 mL via INTRAVENOUS

## 2021-06-04 MED ORDER — ONDANSETRON 4 MG PO TBDP: 4.0000 mg | ORAL_TABLET | Freq: Three times a day (TID) | ORAL | 0 refills | Status: DC | PRN

## 2021-06-04 MED ORDER — PANTOPRAZOLE SODIUM 40 MG PO TBEC: 40.0000 mg | DELAYED_RELEASE_TABLET | Freq: Every day | ORAL | 0 refills | Status: DC

## 2021-06-04 MED ORDER — ONDANSETRON HCL 4 MG/2ML IJ SOLN
4.0000 mg | Freq: Once | INTRAMUSCULAR | Status: AC
  Administered 2021-06-04: 4 mg via INTRAVENOUS
  Filled 2021-06-04: qty 2

## 2021-06-04 MED ORDER — ONDANSETRON HCL 4 MG/2ML IJ SOLN
4.0000 mg | Freq: Once | INTRAMUSCULAR | Status: AC
Start: 2021-06-04 — End: 2021-06-04
  Administered 2021-06-04: 4 mg via INTRAVENOUS
  Filled 2021-06-04: qty 2

## 2021-06-04 NOTE — ED Notes (Signed)
Pts son on the way to pick up pt.  ?

## 2021-06-04 NOTE — ED Notes (Signed)
Attempted to reach pts son for discharge. Voicemail left for son. Pt made aware ?

## 2021-06-04 NOTE — ED Triage Notes (Signed)
Pt to ED from home c/o vomiting x3 days after drinking half of a pint of liquor Saturday night. Vomiting on arrival.  ?

## 2021-06-04 NOTE — ED Notes (Signed)
Took pt via wheelchair to the restroom.  ?

## 2021-06-04 NOTE — ED Provider Notes (Signed)
? ?[place] EMERGENCY DEPARTMENT  ?Provider Note ? ?CSN: [id number] ?Arrival date & time: 06/04/21 8527 ? ?History ?Chief Complaint  ?Patient presents with  ? Emesis  ? ? ?Jon Bennett is a 87 y.o. male with history of alcohol abuse reports he rarely drinks anymore but had half a pint of vodka on Saturday (4 days ago) and has had vomiting since then. Non bloody. Some abdominal wall soreness but no real abdominal pain. Denies any shakes or hallucinations. Reports it was several months prior since his last drink. No fever. No CP.  ? ? ?Home Medications ?Prior to Admission medications   ?Medication Sig Start Date End Date Taking? Authorizing Provider  ?pantoprazole (PROTONIX) 40 MG tablet Take 1 tablet (40 mg total) by mouth daily. 06/04/21  Yes Pollyann Savoy, MD  ?acetaminophen (TYLENOL) 500 MG tablet Take 500 mg by mouth daily as needed for mild pain or moderate pain.    [provider]  ?BESIVANCE 0.6 % SUSP Place 1 drop into the left eye 3 (three) times daily. 08/21/20   [provider]  ?busPIRone (BUSPAR) 10 MG tablet Take 1 tablet (10 mg total) by mouth 2 (two) times daily. 08/27/20   Sherryll Burger, Pratik D, DO  ?doxepin (SINEQUAN) 25 MG capsule Take 25 mg by mouth at bedtime. 06/04/19   [provider]  ?ELIQUIS 5 MG TABS tablet TAKE 1 TABLET BY MOUTH TWICE A DAY 05/20/21   Doreatha Massed, MD  ?famotidine (PEPCID) 20 MG tablet Take 1 tablet (20 mg total) by mouth at bedtime. 06/30/19   Vassie Loll, MD  ?HYDROcodone-acetaminophen (NORCO/VICODIN) 5-325 MG tablet Take 2 tablets by mouth every 4 (four) hours as needed. 08/27/20   Sherryll Burger, Pratik D, DO  ?metoprolol tartrate (LOPRESSOR) 25 MG tablet Take 1 tablet (25 mg total) by mouth 2 (two) times daily. 08/18/18   Catarina Hartshorn, MD  ?ondansetron (ZOFRAN-ODT) 4 MG disintegrating tablet Take 1 tablet (4 mg total) by mouth every 8 (eight) hours as needed for nausea or vomiting. 06/04/21   Pollyann Savoy, MD  ?sertraline (ZOLOFT) 100 MG tablet  Take 100 mg by mouth daily. 08/27/19   [provider]  ?thiamine 100 MG tablet Take 1 tablet (100 mg total) by mouth daily. 08/28/20   Sherryll Burger, Pratik D, DO  ?vitamin B-12 (CYANOCOBALAMIN) 1000 MCG tablet Take 1 tablet (1,000 mcg total) by mouth daily. 06/30/19   Vassie Loll, MD  ?Vitamin D, Ergocalciferol, (DRISDOL) 1.25 MG (50000 UNIT) CAPS capsule Take 50,000 Units by mouth every 7 (seven) days.    [provider]  ? ? ? ?Allergies    ?Asa [aspirin] ? ? ?Review of Systems   ?Review of Systems ?Please see HPI for pertinent positives and negatives ? ?Physical Exam ?BP (!) 162/96   Pulse 88   Temp 98 ?F (36.7 ?C) (Oral)   Resp 20   Ht 5\' 9"  (1.753 m)   Wt 67 kg   SpO2 100%   BMI 21.81 kg/m?  ? ?Physical Exam ?Vitals and nursing note reviewed.  ?Constitutional:   ?   Appearance: Normal appearance.  ?HENT:  ?   Head: Normocephalic and atraumatic.  ?   Nose: Nose normal.  ?   Mouth/Throat:  ?   Mouth: Mucous membranes are dry.  ?Eyes:  ?   Extraocular Movements: Extraocular movements intact.  ?   Conjunctiva/sclera: Conjunctivae normal.  ?Cardiovascular:  ?   Rate and Rhythm: Tachycardia present.  ?Pulmonary:  ?   Effort: Pulmonary  effort is normal.  ?   Breath sounds: Normal breath sounds.  ?Abdominal:  ?   General: Abdomen is flat.  ?   Palpations: Abdomen is soft.  ?   Tenderness: There is no abdominal tenderness. There is no guarding.  ?Musculoskeletal:     ?   General: No swelling. Normal range of motion.  ?   Cervical back: Neck supple.  ?Skin: ?   General: Skin is warm and dry.  ?Neurological:  ?   General: No focal deficit present.  ?   Mental Status: He is alert.  ?Psychiatric:     ?   Mood and Affect: Mood normal.  ? ? ?ED Results / Procedures / Treatments   ?EKG ?EKG Interpretation ? ?Date/Time:  Thursday June 04 2021 03:57:52 EDT ?Ventricular Rate:  102 ?PR Interval:  210 ?QRS Duration: 95 ?QT Interval:  336 ?QTC Calculation: 438 ?R Axis:   -35 ?Text Interpretation: Sinus tachycardia  Ventricular premature complex Borderline prolonged PR interval Left atrial enlargement Left axis deviation Anterior infarct, old Since last tracing Rate faster Confirmed by Susy Frizzle [phone]) on 06/04/2021 4:02:21 AM ? ?Procedures ?Procedures ? ?Medications Ordered in the ED ?Medications  ?ondansetron (ZOFRAN) injection 4 mg (4 mg Intravenous Given 06/04/21 0418)  ?lactated ringers bolus 1,000 mL (0 mLs Intravenous Stopped 06/04/21 0534)  ?ondansetron [facility]) injection 4 mg (4 mg Intravenous Given 06/04/21 0532)  ?pantoprazole (PROTONIX) injection 40 mg (40 mg Intravenous Given 06/04/21 0532)  ? ? ?Initial Impression and Plan ? Patient with recent EtOH use and vomiting. Not really having signifciant abdominal pain with it. Likely gastritis, less likely pancreatitis. Will check labs, give IVF and antiemetics for symptom control.  ? ?ED Course  ? ?Clinical Course as of 06/04/21 0702  ?Thu Jun 04, 2021  ?9811 CMP with elevated liver enzymes, including Bilirubin >6 when previously normal. Lipase and EtOH are neg.  [CS]  ?0557 CBC with mild leukocytosis and thrombocytopenia.  [CS]  ?0606 Patient reports he is feeling some better. Will attempt PO trial.  [CS]  ?9147 Patient tolerating PO fluids and would like to go home. Rx for Zofran, advised to refrain from EtOH use. PCP follow up. RTED for any other concerns.  [CS]  ?  ?Clinical Course User Index ?[CS] Pollyann Savoy, MD  ? ? ? ?MDM Rules/Calculators/A&P ?Medical Decision Making ?Given presenting complaint, I considered that admission might be necessary. After review of results from ED lab and/or imaging studies, admission to the hospital is not indicated at this time.   ? ?Problems Addressed: ?Acute alcoholic gastritis without hemorrhage: acute illness or injury ? ?Amount and/or Complexity of Data Reviewed ?Labs: ordered. Decision-making details documented in ED Course. ? ?Risk ?Prescription drug management. ?Decision regarding hospitalization. ? ? ? ?Final  Clinical Impression(s) / ED Diagnoses ?Final diagnoses:  ?Acute alcoholic gastritis without hemorrhage  ? ? ?Rx / DC Orders ?ED Discharge Orders   ? ?      Ordered  ?  pantoprazole (PROTONIX) 40 MG tablet  Daily       ? 06/04/21 0702  ?  ondansetron (ZOFRAN-ODT) 4 MG disintegrating tablet  Every 8 hours PRN       ? 06/04/21 0702  ? ?  ?  ? ?  ? ?  ?Pollyann Savoy, MD ?06/04/21 [phone] ? ?

## 2021-06-09 DIAGNOSIS — K219 Gastro-esophageal reflux disease without esophagitis: Secondary | ICD-10-CM | POA: Diagnosis not present

## 2021-06-09 DIAGNOSIS — F3342 Major depressive disorder, recurrent, in full remission: Secondary | ICD-10-CM | POA: Diagnosis not present

## 2021-06-09 DIAGNOSIS — I10 Essential (primary) hypertension: Secondary | ICD-10-CM | POA: Diagnosis not present

## 2021-06-09 DIAGNOSIS — Z7141 Alcohol abuse counseling and surveillance of alcoholic: Secondary | ICD-10-CM | POA: Diagnosis not present

## 2021-06-23 ENCOUNTER — Encounter (HOSPITAL_COMMUNITY): Payer: Self-pay | Admitting: Emergency Medicine

## 2021-06-23 ENCOUNTER — Other Ambulatory Visit: Payer: Self-pay

## 2021-06-23 ENCOUNTER — Emergency Department (HOSPITAL_COMMUNITY): Payer: Medicare Other

## 2021-06-23 ENCOUNTER — Inpatient Hospital Stay (HOSPITAL_COMMUNITY)
Admission: EM | Admit: 2021-06-23 | Discharge: 2021-07-08 | DRG: 871 | Disposition: A | Discharge status: Skilled Nursing Facility | Payer: Medicare Other | Attending: Internal Medicine | Admitting: Internal Medicine

## 2021-06-23 DIAGNOSIS — R6520 Severe sepsis without septic shock: Secondary | ICD-10-CM | POA: Diagnosis present

## 2021-06-23 DIAGNOSIS — K219 Gastro-esophageal reflux disease without esophagitis: Secondary | ICD-10-CM | POA: Diagnosis not present

## 2021-06-23 DIAGNOSIS — Z20822 Contact with and (suspected) exposure to covid-19: Secondary | ICD-10-CM | POA: Diagnosis not present

## 2021-06-23 DIAGNOSIS — E872 Acidosis, unspecified: Secondary | ICD-10-CM | POA: Diagnosis present

## 2021-06-23 DIAGNOSIS — J9811 Atelectasis: Secondary | ICD-10-CM | POA: Diagnosis not present

## 2021-06-23 DIAGNOSIS — R7401 Elevation of levels of liver transaminase levels: Secondary | ICD-10-CM | POA: Diagnosis present

## 2021-06-23 DIAGNOSIS — R17 Unspecified jaundice: Secondary | ICD-10-CM | POA: Diagnosis not present

## 2021-06-23 DIAGNOSIS — E119 Type 2 diabetes mellitus without complications: Secondary | ICD-10-CM | POA: Diagnosis present

## 2021-06-23 DIAGNOSIS — D638 Anemia in other chronic diseases classified elsewhere: Secondary | ICD-10-CM | POA: Diagnosis present

## 2021-06-23 DIAGNOSIS — R279 Unspecified lack of coordination: Secondary | ICD-10-CM | POA: Diagnosis not present

## 2021-06-23 DIAGNOSIS — Z0389 Encounter for observation for other suspected diseases and conditions ruled out: Secondary | ICD-10-CM | POA: Diagnosis not present

## 2021-06-23 DIAGNOSIS — E876 Hypokalemia: Secondary | ICD-10-CM | POA: Diagnosis not present

## 2021-06-23 DIAGNOSIS — Z7901 Long term (current) use of anticoagulants: Secondary | ICD-10-CM | POA: Diagnosis not present

## 2021-06-23 DIAGNOSIS — Z86718 Personal history of other venous thrombosis and embolism: Secondary | ICD-10-CM | POA: Diagnosis not present

## 2021-06-23 DIAGNOSIS — D6959 Other secondary thrombocytopenia: Secondary | ICD-10-CM | POA: Diagnosis present

## 2021-06-23 DIAGNOSIS — Z66 Do not resuscitate: Secondary | ICD-10-CM | POA: Diagnosis present

## 2021-06-23 DIAGNOSIS — G9341 Metabolic encephalopathy: Secondary | ICD-10-CM | POA: Diagnosis not present

## 2021-06-23 DIAGNOSIS — K2981 Duodenitis with bleeding: Secondary | ICD-10-CM | POA: Diagnosis present

## 2021-06-23 DIAGNOSIS — R600 Localized edema: Secondary | ICD-10-CM | POA: Diagnosis not present

## 2021-06-23 DIAGNOSIS — M25562 Pain in left knee: Secondary | ICD-10-CM | POA: Diagnosis not present

## 2021-06-23 DIAGNOSIS — D500 Iron deficiency anemia secondary to blood loss (chronic): Secondary | ICD-10-CM | POA: Diagnosis present

## 2021-06-23 DIAGNOSIS — Z515 Encounter for palliative care: Secondary | ICD-10-CM

## 2021-06-23 DIAGNOSIS — K8020 Calculus of gallbladder without cholecystitis without obstruction: Secondary | ICD-10-CM | POA: Diagnosis not present

## 2021-06-23 DIAGNOSIS — I81 Portal vein thrombosis: Secondary | ICD-10-CM | POA: Diagnosis not present

## 2021-06-23 DIAGNOSIS — R509 Fever, unspecified: Secondary | ICD-10-CM | POA: Diagnosis not present

## 2021-06-23 DIAGNOSIS — R2681 Unsteadiness on feet: Secondary | ICD-10-CM | POA: Diagnosis not present

## 2021-06-23 DIAGNOSIS — R945 Abnormal results of liver function studies: Secondary | ICD-10-CM | POA: Diagnosis not present

## 2021-06-23 DIAGNOSIS — K5711 Diverticulosis of small intestine without perforation or abscess with bleeding: Secondary | ICD-10-CM | POA: Diagnosis not present

## 2021-06-23 DIAGNOSIS — E871 Hypo-osmolality and hyponatremia: Secondary | ICD-10-CM | POA: Diagnosis not present

## 2021-06-23 DIAGNOSIS — K922 Gastrointestinal hemorrhage, unspecified: Secondary | ICD-10-CM | POA: Diagnosis not present

## 2021-06-23 DIAGNOSIS — B9629 Other Escherichia coli [E. coli] as the cause of diseases classified elsewhere: Secondary | ICD-10-CM | POA: Diagnosis not present

## 2021-06-23 DIAGNOSIS — A419 Sepsis, unspecified organism: Secondary | ICD-10-CM | POA: Diagnosis not present

## 2021-06-23 DIAGNOSIS — K226 Gastro-esophageal laceration-hemorrhage syndrome: Secondary | ICD-10-CM | POA: Diagnosis present

## 2021-06-23 DIAGNOSIS — Z886 Allergy status to analgesic agent status: Secondary | ICD-10-CM

## 2021-06-23 DIAGNOSIS — K8001 Calculus of gallbladder with acute cholecystitis with obstruction: Secondary | ICD-10-CM | POA: Diagnosis not present

## 2021-06-23 DIAGNOSIS — K828 Other specified diseases of gallbladder: Secondary | ICD-10-CM | POA: Diagnosis not present

## 2021-06-23 DIAGNOSIS — R1311 Dysphagia, oral phase: Secondary | ICD-10-CM | POA: Diagnosis not present

## 2021-06-23 DIAGNOSIS — D696 Thrombocytopenia, unspecified: Secondary | ICD-10-CM | POA: Diagnosis not present

## 2021-06-23 DIAGNOSIS — Z8673 Personal history of transient ischemic attack (TIA), and cerebral infarction without residual deficits: Secondary | ICD-10-CM

## 2021-06-23 DIAGNOSIS — I10 Essential (primary) hypertension: Secondary | ICD-10-CM | POA: Diagnosis not present

## 2021-06-23 DIAGNOSIS — R112 Nausea with vomiting, unspecified: Secondary | ICD-10-CM | POA: Diagnosis not present

## 2021-06-23 DIAGNOSIS — K2100 Gastro-esophageal reflux disease with esophagitis, without bleeding: Secondary | ICD-10-CM | POA: Diagnosis present

## 2021-06-23 DIAGNOSIS — R109 Unspecified abdominal pain: Secondary | ICD-10-CM | POA: Diagnosis not present

## 2021-06-23 DIAGNOSIS — Z9889 Other specified postprocedural states: Secondary | ICD-10-CM | POA: Diagnosis not present

## 2021-06-23 DIAGNOSIS — K920 Hematemesis: Secondary | ICD-10-CM | POA: Diagnosis not present

## 2021-06-23 DIAGNOSIS — K8309 Other cholangitis: Secondary | ICD-10-CM | POA: Diagnosis not present

## 2021-06-23 DIAGNOSIS — A4151 Sepsis due to Escherichia coli [E. coli]: Principal | ICD-10-CM | POA: Diagnosis present

## 2021-06-23 DIAGNOSIS — D649 Anemia, unspecified: Secondary | ICD-10-CM | POA: Diagnosis not present

## 2021-06-23 DIAGNOSIS — K8034 Calculus of bile duct with chronic cholangitis without obstruction: Secondary | ICD-10-CM | POA: Diagnosis not present

## 2021-06-23 DIAGNOSIS — M25462 Effusion, left knee: Secondary | ICD-10-CM | POA: Diagnosis not present

## 2021-06-23 DIAGNOSIS — Z743 Need for continuous supervision: Secondary | ICD-10-CM | POA: Diagnosis not present

## 2021-06-23 DIAGNOSIS — Z8249 Family history of ischemic heart disease and other diseases of the circulatory system: Secondary | ICD-10-CM

## 2021-06-23 DIAGNOSIS — Z7189 Other specified counseling: Secondary | ICD-10-CM | POA: Diagnosis not present

## 2021-06-23 DIAGNOSIS — R000 Tachycardia, unspecified: Secondary | ICD-10-CM | POA: Diagnosis not present

## 2021-06-23 DIAGNOSIS — L89151 Pressure ulcer of sacral region, stage 1: Secondary | ICD-10-CM | POA: Diagnosis not present

## 2021-06-23 DIAGNOSIS — K8030 Calculus of bile duct with cholangitis, unspecified, without obstruction: Secondary | ICD-10-CM | POA: Diagnosis not present

## 2021-06-23 DIAGNOSIS — K449 Diaphragmatic hernia without obstruction or gangrene: Secondary | ICD-10-CM | POA: Diagnosis not present

## 2021-06-23 DIAGNOSIS — Z741 Need for assistance with personal care: Secondary | ICD-10-CM | POA: Diagnosis not present

## 2021-06-23 DIAGNOSIS — Z79899 Other long term (current) drug therapy: Secondary | ICD-10-CM

## 2021-06-23 DIAGNOSIS — L8990 Pressure ulcer of unspecified site, unspecified stage: Secondary | ICD-10-CM | POA: Insufficient documentation

## 2021-06-23 DIAGNOSIS — G8929 Other chronic pain: Secondary | ICD-10-CM | POA: Diagnosis present

## 2021-06-23 DIAGNOSIS — M6281 Muscle weakness (generalized): Secondary | ICD-10-CM | POA: Diagnosis not present

## 2021-06-23 DIAGNOSIS — N281 Cyst of kidney, acquired: Secondary | ICD-10-CM | POA: Diagnosis not present

## 2021-06-23 DIAGNOSIS — K8050 Calculus of bile duct without cholangitis or cholecystitis without obstruction: Secondary | ICD-10-CM | POA: Diagnosis not present

## 2021-06-23 DIAGNOSIS — N179 Acute kidney failure, unspecified: Secondary | ICD-10-CM | POA: Diagnosis not present

## 2021-06-23 DIAGNOSIS — R935 Abnormal findings on diagnostic imaging of other abdominal regions, including retroperitoneum: Secondary | ICD-10-CM | POA: Diagnosis not present

## 2021-06-23 DIAGNOSIS — M1712 Unilateral primary osteoarthritis, left knee: Secondary | ICD-10-CM | POA: Diagnosis not present

## 2021-06-23 DIAGNOSIS — K146 Glossodynia: Secondary | ICD-10-CM | POA: Diagnosis present

## 2021-06-23 DIAGNOSIS — K5710 Diverticulosis of small intestine without perforation or abscess without bleeding: Secondary | ICD-10-CM | POA: Diagnosis not present

## 2021-06-23 DIAGNOSIS — R41841 Cognitive communication deficit: Secondary | ICD-10-CM | POA: Diagnosis not present

## 2021-06-23 DIAGNOSIS — K2980 Duodenitis without bleeding: Secondary | ICD-10-CM | POA: Diagnosis not present

## 2021-06-23 DIAGNOSIS — R748 Abnormal levels of other serum enzymes: Secondary | ICD-10-CM | POA: Diagnosis not present

## 2021-06-23 DIAGNOSIS — R682 Dry mouth, unspecified: Secondary | ICD-10-CM | POA: Diagnosis present

## 2021-06-23 LAB — APTT: aPTT: 27 seconds (ref 24–36)

## 2021-06-23 LAB — CBC WITH DIFFERENTIAL/PLATELET
Abs Immature Granulocytes: 0.01 10*3/uL (ref 0.00–0.07)
Basophils Absolute: 0 10*3/uL (ref 0.0–0.1)
Basophils Relative: 1 %
Eosinophils Absolute: 0 10*3/uL (ref 0.0–0.5)
Eosinophils Relative: 0 %
HCT: 37.9 % — ABNORMAL LOW (ref 39.0–52.0)
Hemoglobin: 13.2 g/dL (ref 13.0–17.0)
Immature Granulocytes: 0 %
Lymphocytes Relative: 5 %
Lymphs Abs: 0.2 10*3/uL — ABNORMAL LOW (ref 0.7–4.0)
MCH: 31.7 pg (ref 26.0–34.0)
MCHC: 34.8 g/dL (ref 30.0–36.0)
MCV: 91.1 fL (ref 80.0–100.0)
Monocytes Absolute: 0.1 10*3/uL (ref 0.1–1.0)
Monocytes Relative: 4 %
Neutro Abs: 3.2 10*3/uL (ref 1.7–7.7)
Neutrophils Relative %: 90 %
Platelets: 109 10*3/uL — ABNORMAL LOW (ref 150–400)
RBC: 4.16 MIL/uL — ABNORMAL LOW (ref 4.22–5.81)
RDW: 14.1 % (ref 11.5–15.5)
WBC: 3.5 10*3/uL — ABNORMAL LOW (ref 4.0–10.5)
nRBC: 0 % (ref 0.0–0.2)

## 2021-06-23 LAB — PROTIME-INR
INR: 1.1 (ref 0.8–1.2)
Prothrombin Time: 14 seconds (ref 11.4–15.2)

## 2021-06-23 LAB — LACTIC ACID, PLASMA
Lactic Acid, Venous: 3.3 mmol/L (ref 0.5–1.9)
Lactic Acid, Venous: 4.5 mmol/L (ref 0.5–1.9)

## 2021-06-23 LAB — COMPREHENSIVE METABOLIC PANEL
ALT: 55 U/L — ABNORMAL HIGH (ref 0–44)
AST: 158 U/L — ABNORMAL HIGH (ref 15–41)
Albumin: 3.6 g/dL (ref 3.5–5.0)
Alkaline Phosphatase: 286 U/L — ABNORMAL HIGH (ref 38–126)
Anion gap: 8 (ref 5–15)
BUN: 16 mg/dL (ref 8–23)
CO2: 22 mmol/L (ref 22–32)
Calcium: 10.4 mg/dL — ABNORMAL HIGH (ref 8.9–10.3)
Chloride: 107 mmol/L (ref 98–111)
Creatinine, Ser: 1.48 mg/dL — ABNORMAL HIGH (ref 0.61–1.24)
GFR, Estimated: 46 mL/min — ABNORMAL LOW (ref >60)
Glucose, Bld: 142 mg/dL — ABNORMAL HIGH (ref 70–99)
Potassium: 4 mmol/L (ref 3.5–5.1)
Sodium: 137 mmol/L (ref 135–145)
Total Bilirubin: 4 mg/dL — ABNORMAL HIGH (ref 0.3–1.2)
Total Protein: 7.2 g/dL (ref 6.5–8.1)

## 2021-06-23 MED ORDER — VANCOMYCIN HCL 1250 MG/250ML IV SOLN: 1250.0000 mg | INTRAVENOUS | Status: DC

## 2021-06-23 MED ORDER — ONDANSETRON HCL 4 MG/2ML IJ SOLN: 4.0000 mg | Freq: Once | INTRAMUSCULAR | Status: DC

## 2021-06-23 MED ORDER — METRONIDAZOLE 500 MG/100ML IV SOLN
500.0000 mg | Freq: Once | INTRAVENOUS | Status: AC
  Administered 2021-06-23: 500 mg via INTRAVENOUS
  Filled 2021-06-23: qty 100

## 2021-06-23 MED ORDER — LACTATED RINGERS IV BOLUS (SEPSIS)
1000.0000 mL | Freq: Once | INTRAVENOUS | Status: AC
  Administered 2021-06-23: 1000 mL via INTRAVENOUS

## 2021-06-23 MED ORDER — VANCOMYCIN HCL IN DEXTROSE 1-5 GM/200ML-% IV SOLN: 1000.0000 mg | Freq: Once | INTRAVENOUS | Status: DC

## 2021-06-23 MED ORDER — IOHEXOL 300 MG/ML  SOLN
75.0000 mL | Freq: Once | INTRAMUSCULAR | Status: AC | PRN
  Administered 2021-06-23: 75 mL via INTRAVENOUS

## 2021-06-23 MED ORDER — LACTATED RINGERS IV BOLUS (SEPSIS)
250.0000 mL | Freq: Once | INTRAVENOUS | Status: AC
  Administered 2021-06-23: 250 mL via INTRAVENOUS

## 2021-06-23 MED ORDER — LACTATED RINGERS IV SOLN: INTRAVENOUS | Status: DC

## 2021-06-23 MED ORDER — VANCOMYCIN HCL 1250 MG/250ML IV SOLN
1250.0000 mg | Freq: Once | INTRAVENOUS | Status: AC
  Administered 2021-06-23: 1250 mg via INTRAVENOUS
  Filled 2021-06-23: qty 250

## 2021-06-23 MED ORDER — SODIUM CHLORIDE 0.9 % IV SOLN
2.0000 g | Freq: Once | INTRAVENOUS | Status: AC
  Administered 2021-06-23: 2 g via INTRAVENOUS
  Filled 2021-06-23: qty 12.5

## 2021-06-23 MED ORDER — SODIUM CHLORIDE 0.9 % IV SOLN
2.0000 g | Freq: Two times a day (BID) | INTRAVENOUS | Status: DC
  Administered 2021-06-24 (×2): 2 g via INTRAVENOUS
  Filled 2021-06-23 (×2): qty 12.5

## 2021-06-23 MED ORDER — ONDANSETRON HCL 4 MG/2ML IJ SOLN
4.0000 mg | Freq: Once | INTRAMUSCULAR | Status: AC
  Administered 2021-06-23: 4 mg via INTRAVENOUS
  Filled 2021-06-23: qty 2

## 2021-06-23 NOTE — ED Provider Notes (Signed)
?  Provider Note ?MRN:  [id number]  ?Arrival date & time: 06/24/21    ?ED Course and Medical Decision Making  ?Assumed care from Dr. Lynelle Doctor at shift change. ? ?History of alcohol use disorder here with persistent nausea vomiting, tachycardia, abnormal LFTs.  Anticipating admission, awaiting CT abdomen. ? ?CT imaging showing a dilated common bile duct but otherwise no significant findings.  Given the fever, persistent tachycardia, cholangitis is considered.  Admitted to medicine for further care. ? ?.Critical Care ?Performed by: Sabas Sous, MD ?Authorized by: Sabas Sous, MD  ? ?Critical care provider statement:  ?  Critical care time (minutes):  38 ?  Critical care was necessary to treat or prevent imminent or life-threatening deterioration of the following conditions:  Sepsis ?  Critical care was time spent personally by me on the following activities:  Development of treatment plan with patient or surrogate, discussions with consultants, evaluation of patient's response to treatment, examination of patient, ordering and review of laboratory studies, ordering and review of radiographic studies, ordering and performing treatments and interventions, pulse oximetry, re-evaluation of patient's condition and review of old charts ? ?Final Clinical Impressions(s) / ED Diagnoses  ? ?  ICD-10-CM   ?1. Sepsis, due to unspecified organism, unspecified whether acute organ dysfunction present (HCC)  A41.9   ?  ?  ?ED Discharge Orders   ? ? None  ? ?  ?  ?Discharge Instructions   ?None ?  ? ? ?Elmer Sow. Pilar Plate, MD ?[facility] Emergency Medicine ?[facility] [email or web address] ? ?  ?Sabas Sous, MD ?06/24/21 0132 ? ?

## 2021-06-23 NOTE — ED Triage Notes (Signed)
Pt c/o n/v x 3 weeks.  ?

## 2021-06-23 NOTE — Progress Notes (Signed)
Pharmacy Antibiotic Note ? ?Jon Bennett is a 87 y.o. male admitted on 06/23/2021 with vomiting and possible sepsis. Pharmacy has been consulted for vancomycin and cefepime dosing for 7 days. Cr elevated above baseline, cultures sent. ? ?Plan: ?Vancomycin 1250mg  IV q36h ?Cefepime 2g IV q12h ?Follow Cr, Cx, LOT ?Vancomycin levels as needed - watch Cr closely, may need VR prior to next dose ? ?Height: 5\' 9"  (175.3 cm) ?Weight: 68 kg (150 lb) ?IBW/kg (Calculated) : 70.7 ? ?Temp (24hrs), Avg:100.7 ?F (38.2 ?C), Min:100.7 ?F (38.2 ?C), Max:100.7 ?F (38.2 ?C) ? ?No results for input(s): WBC, CREATININE, LATICACIDVEN, VANCOTROUGH, VANCOPEAK, VANCORANDOM, GENTTROUGH, GENTPEAK, GENTRANDOM, TOBRATROUGH, TOBRAPEAK, TOBRARND, AMIKACINPEAK, AMIKACINTROU, AMIKACIN in the last 168 hours.  ?Estimated Creatinine Clearance: 55.1 mL/min (by C-G formula based on SCr of 0.96 mg/dL).   ? ?Allergies  ?Allergen Reactions  ? Asa [Aspirin] Shortness Of Breath  ? ? ?Antimicrobials this admission: ?Vancomycin 4/11 >> ?Cefepime 4/11 >> ? ? ?Microbiology results: ?sent ? ?Thank you for allowing pharmacy to be a part of this patient?s care. ? ?6/11, PharmD, BCPS, BCCP ?Clinical Pharmacist ?Please check AMION for all [facility] Pharmacy numbers ?06/23/2021 ? ? ?

## 2021-06-23 NOTE — ED Notes (Signed)
Date and time results received: 06/23/21 2357 ?(use smartphrase ".now" to insert current time) ? ?Test: lactic acid ?Critical Value: 4.5 ? ?Name of Provider Notified: Randol Kern, MD ? ?

## 2021-06-23 NOTE — ED Provider Notes (Signed)
?[facility] ?Provider Note ? ? ?CSN: [id number] ?Arrival date & time: 06/23/21  2006 ? ?  ? ?History ? ?Chief Complaint  ?Patient presents with  ? Vomiting  ? ? ?Jon Bennett is a 87 y.o. male. ? ?HPI ? ?Patient has history of hypertension, gastric with left esophageal reflux disease, alcoholic pancreatitis, diabetes and then splenic vein thrombosis.  Patient recently seen in the emergency room on March 23.  Diagnosed with alcoholic gastritis.  Patient noted to have elevated LFTs and bilirubin.  Patient states he seemed to get better after treatment.  He has not had any alcohol since then.  Patient however states in the last couple of days he started having trouble with nausea vomiting.  He is also having some urinary frequency.  He denies any abdominal pain.  He denies any headache.  No chest pain or shortness of breath ? ?Home Medications ?Prior to Admission medications   ?Medication Sig Start Date End Date Taking? Authorizing Provider  ?acetaminophen (TYLENOL) 500 MG tablet Take 500 mg by mouth daily as needed for mild pain or moderate pain.    [provider]  ?BESIVANCE 0.6 % SUSP Place 1 drop into the left eye 3 (three) times daily. 08/21/20   [provider]  ?busPIRone (BUSPAR) 10 MG tablet Take 1 tablet (10 mg total) by mouth 2 (two) times daily. 08/27/20   Manuella Ghazi, Pratik D, DO  ?doxepin (SINEQUAN) 25 MG capsule Take 25 mg by mouth at bedtime. 06/04/19   [provider]  ?ELIQUIS 5 MG TABS tablet TAKE 1 TABLET BY MOUTH TWICE A DAY 05/20/21   Derek Jack, MD  ?famotidine (PEPCID) 20 MG tablet Take 1 tablet (20 mg total) by mouth at bedtime. 06/30/19   Barton Dubois, MD  ?HYDROcodone-acetaminophen (NORCO/VICODIN) 5-325 MG tablet Take 2 tablets by mouth every 4 (four) hours as needed. 08/27/20   Manuella Ghazi, Pratik D, DO  ?metoprolol tartrate (LOPRESSOR) 25 MG tablet Take 1 tablet (25 mg total) by mouth 2 (two) times daily. 08/18/18   Orson Eva, MD  ?ondansetron  (ZOFRAN-ODT) 4 MG disintegrating tablet Take 1 tablet (4 mg total) by mouth every 8 (eight) hours as needed for nausea or vomiting. 06/04/21   Truddie Hidden, MD  ?pantoprazole (PROTONIX) 40 MG tablet Take 1 tablet (40 mg total) by mouth daily. 06/04/21   Truddie Hidden, MD  ?sertraline (ZOLOFT) 100 MG tablet Take 100 mg by mouth daily. 08/27/19   [provider]  ?thiamine 100 MG tablet Take 1 tablet (100 mg total) by mouth daily. 08/28/20   Manuella Ghazi, Pratik D, DO  ?vitamin B-12 (CYANOCOBALAMIN) 1000 MCG tablet Take 1 tablet (1,000 mcg total) by mouth daily. 06/30/19   Barton Dubois, MD  ?Vitamin D, Ergocalciferol, (DRISDOL) 1.25 MG (50000 UNIT) CAPS capsule Take 50,000 Units by mouth every 7 (seven) days.    [provider]  ?   ? ?Allergies    ?Asa [aspirin]   ? ?Review of Systems   ?Review of Systems  ?Constitutional:  Positive for fever.  ? ?Physical Exam ?Updated Vital Signs ?BP (!) 199/87   Pulse (!) 125   Temp (!) 100.7 ?F (38.2 ?C) (Oral)   Resp (!) 23   Ht 1.753 m (5\' 9" )   Wt 68 kg   SpO2 100%   BMI 22.15 kg/m?  ?Physical Exam ?Vitals and nursing note reviewed.  ?Constitutional:   ?   Appearance: He is well-developed. He is not diaphoretic.  ?HENT:  ?  Head: Normocephalic and atraumatic.  ?   Right Ear: External ear normal.  ?   Left Ear: External ear normal.  ?   Mouth/Throat:  ?   Mouth: Mucous membranes are dry.  ?Eyes:  ?   General: No scleral icterus.    ?   Right eye: No discharge.     ?   Left eye: No discharge.  ?   Conjunctiva/sclera: Conjunctivae normal.  ?Neck:  ?   Trachea: No tracheal deviation.  ?Cardiovascular:  ?   Rate and Rhythm: Regular rhythm. Tachycardia present.  ?Pulmonary:  ?   Effort: Pulmonary effort is normal. No respiratory distress.  ?   Breath sounds: Normal breath sounds. No stridor. No wheezing or rales.  ?Abdominal:  ?   General: Bowel sounds are normal. There is no distension.  ?   Palpations: Abdomen is soft.  ?   Tenderness: There is no  abdominal tenderness. There is no guarding or rebound.  ?Musculoskeletal:     ?   General: No tenderness or deformity.  ?   Cervical back: Neck supple.  ?Skin: ?   General: Skin is warm and dry.  ?   Findings: No rash.  ?Neurological:  ?   General: No focal deficit present.  ?   Mental Status: He is alert.  ?   Cranial Nerves: No cranial nerve deficit (no facial droop, extraocular movements intact, no slurred speech).  ?   Sensory: No sensory deficit.  ?   Motor: No abnormal muscle tone or seizure activity.  ?   Coordination: Coordination normal.  ?Psychiatric:     ?   Mood and Affect: Mood normal.  ? ? ?ED Results / Procedures / Treatments   ?Labs ?(all labs ordered are listed, but only abnormal results are displayed) ?Labs Reviewed - No data to display ? ?EKG ?EKG Interpretation ? ?Date/Time:  Tuesday June 23 2021 20:07:27 EDT ?Ventricular Rate:  124 ?PR Interval:  70 ?QRS Duration: 95 ?QT Interval:  412 ?QTC Calculation: 592 ?R Axis:   -26 ?Text Interpretation: Sinus or ectopic atrial tachycardia Borderline left axis deviation Anteroseptal infarct, old Repol abnrm suggests ischemia, anterolateral Prolonged QT interval Since last tracing rate faster Confirmed by Dorie Rank [phone]) on 06/23/2021 8:14:34 PM ? ?Radiology ?No results found. ? ?Procedures ?Procedures  ? ? ?Medications Ordered in ED ?Medications - No data to display ? ?ED Course/ Medical Decision Making/ A&P ?Clinical Course as of 06/24/21 1506  ?Tue Jun 23, 2021  ?2305 Comprehensive metabolic panel(!) [JK]  ?2306 Labs show hypercalcemia with elevated creatinine.  Patient has persistent elevation of LFTs [JK]  ?  ?Clinical Course User Index ?[JK] Dorie Rank, MD  ? ?                        ?Medical Decision Making ?Amount and/or Complexity of Data Reviewed ?Labs: ordered. Decision-making details documented in ED Course. ?Radiology: ordered. ?ECG/medicine tests: ordered. ? ?Risk ?Prescription drug management. ?Decision regarding hospitalization. ? ? ?Pt  presented with persistent nausea and vomiting.  In the hospital recently noted to have elevated lfts and bilirubin.  Pt with recurrent of nausea and vomiting.  New AKI.  Persistent elevated lfts and bili.  Pt will require admission to the hospital.  CT scan of abdomen ordered to evaluate for mass, obstruction, cholalgnitis, cholecystitis.  Low grade temp.  Pt started on sepsis protocol.  Pt will need admission to hospital.  CT scan pending at time of  shift change.  Turned over to Dr Sedonia Small ? ? ? ? ? ? ? ?Final Clinical Impression(s) / ED Diagnoses ?Final diagnoses:  ?Sepsis, due to unspecified organism, unspecified whether acute organ dysfunction present [facility])  ? ?  ?Dorie Rank, MD ?06/24/21 1511 ? ?

## 2021-06-23 NOTE — ED Notes (Signed)
Patient transported to CT 

## 2021-06-23 NOTE — Progress Notes (Signed)
Elink following Code Sepsis. 

## 2021-06-24 ENCOUNTER — Inpatient Hospital Stay (HOSPITAL_COMMUNITY): Payer: Medicare Other

## 2021-06-24 ENCOUNTER — Encounter (HOSPITAL_COMMUNITY): Payer: Self-pay | Admitting: Internal Medicine

## 2021-06-24 DIAGNOSIS — K219 Gastro-esophageal reflux disease without esophagitis: Secondary | ICD-10-CM | POA: Diagnosis not present

## 2021-06-24 DIAGNOSIS — D696 Thrombocytopenia, unspecified: Secondary | ICD-10-CM | POA: Diagnosis not present

## 2021-06-24 DIAGNOSIS — M25462 Effusion, left knee: Secondary | ICD-10-CM | POA: Diagnosis present

## 2021-06-24 DIAGNOSIS — K8001 Calculus of gallbladder with acute cholecystitis with obstruction: Secondary | ICD-10-CM | POA: Diagnosis present

## 2021-06-24 DIAGNOSIS — K226 Gastro-esophageal laceration-hemorrhage syndrome: Secondary | ICD-10-CM | POA: Diagnosis present

## 2021-06-24 DIAGNOSIS — K2980 Duodenitis without bleeding: Secondary | ICD-10-CM | POA: Diagnosis not present

## 2021-06-24 DIAGNOSIS — L89151 Pressure ulcer of sacral region, stage 1: Secondary | ICD-10-CM | POA: Diagnosis not present

## 2021-06-24 DIAGNOSIS — Z20822 Contact with and (suspected) exposure to covid-19: Secondary | ICD-10-CM | POA: Diagnosis present

## 2021-06-24 DIAGNOSIS — K922 Gastrointestinal hemorrhage, unspecified: Secondary | ICD-10-CM | POA: Diagnosis not present

## 2021-06-24 DIAGNOSIS — N179 Acute kidney failure, unspecified: Secondary | ICD-10-CM | POA: Diagnosis present

## 2021-06-24 DIAGNOSIS — R7401 Elevation of levels of liver transaminase levels: Secondary | ICD-10-CM | POA: Diagnosis not present

## 2021-06-24 DIAGNOSIS — R6520 Severe sepsis without septic shock: Secondary | ICD-10-CM | POA: Diagnosis present

## 2021-06-24 DIAGNOSIS — A419 Sepsis, unspecified organism: Secondary | ICD-10-CM | POA: Diagnosis not present

## 2021-06-24 DIAGNOSIS — K8309 Other cholangitis: Secondary | ICD-10-CM | POA: Diagnosis present

## 2021-06-24 DIAGNOSIS — K8050 Calculus of bile duct without cholangitis or cholecystitis without obstruction: Secondary | ICD-10-CM | POA: Diagnosis not present

## 2021-06-24 DIAGNOSIS — K2981 Duodenitis with bleeding: Secondary | ICD-10-CM | POA: Diagnosis present

## 2021-06-24 DIAGNOSIS — R748 Abnormal levels of other serum enzymes: Secondary | ICD-10-CM | POA: Diagnosis not present

## 2021-06-24 DIAGNOSIS — R112 Nausea with vomiting, unspecified: Secondary | ICD-10-CM | POA: Diagnosis present

## 2021-06-24 DIAGNOSIS — Z7189 Other specified counseling: Secondary | ICD-10-CM | POA: Diagnosis not present

## 2021-06-24 DIAGNOSIS — K2100 Gastro-esophageal reflux disease with esophagitis, without bleeding: Secondary | ICD-10-CM | POA: Diagnosis not present

## 2021-06-24 DIAGNOSIS — Z86718 Personal history of other venous thrombosis and embolism: Secondary | ICD-10-CM | POA: Diagnosis not present

## 2021-06-24 DIAGNOSIS — E871 Hypo-osmolality and hyponatremia: Secondary | ICD-10-CM | POA: Diagnosis not present

## 2021-06-24 DIAGNOSIS — Z7901 Long term (current) use of anticoagulants: Secondary | ICD-10-CM

## 2021-06-24 DIAGNOSIS — Z515 Encounter for palliative care: Secondary | ICD-10-CM | POA: Diagnosis not present

## 2021-06-24 DIAGNOSIS — J9811 Atelectasis: Secondary | ICD-10-CM | POA: Diagnosis not present

## 2021-06-24 DIAGNOSIS — A4151 Sepsis due to Escherichia coli [E. coli]: Secondary | ICD-10-CM | POA: Diagnosis present

## 2021-06-24 DIAGNOSIS — E872 Acidosis, unspecified: Secondary | ICD-10-CM | POA: Diagnosis present

## 2021-06-24 DIAGNOSIS — K5711 Diverticulosis of small intestine without perforation or abscess with bleeding: Secondary | ICD-10-CM | POA: Diagnosis present

## 2021-06-24 DIAGNOSIS — D6959 Other secondary thrombocytopenia: Secondary | ICD-10-CM | POA: Diagnosis present

## 2021-06-24 DIAGNOSIS — D638 Anemia in other chronic diseases classified elsewhere: Secondary | ICD-10-CM | POA: Diagnosis present

## 2021-06-24 DIAGNOSIS — I10 Essential (primary) hypertension: Secondary | ICD-10-CM | POA: Diagnosis present

## 2021-06-24 DIAGNOSIS — Z66 Do not resuscitate: Secondary | ICD-10-CM | POA: Diagnosis present

## 2021-06-24 DIAGNOSIS — I81 Portal vein thrombosis: Secondary | ICD-10-CM | POA: Diagnosis present

## 2021-06-24 DIAGNOSIS — E119 Type 2 diabetes mellitus without complications: Secondary | ICD-10-CM | POA: Diagnosis present

## 2021-06-24 DIAGNOSIS — G9341 Metabolic encephalopathy: Secondary | ICD-10-CM | POA: Diagnosis not present

## 2021-06-24 DIAGNOSIS — D500 Iron deficiency anemia secondary to blood loss (chronic): Secondary | ICD-10-CM | POA: Diagnosis present

## 2021-06-24 DIAGNOSIS — K449 Diaphragmatic hernia without obstruction or gangrene: Secondary | ICD-10-CM | POA: Diagnosis not present

## 2021-06-24 LAB — BLOOD CULTURE ID PANEL (REFLEXED) - BCID2

## 2021-06-24 LAB — COMPREHENSIVE METABOLIC PANEL
ALT: 56 U/L — ABNORMAL HIGH (ref 0–44)
AST: 139 U/L — ABNORMAL HIGH (ref 15–41)
Albumin: 2.9 g/dL — ABNORMAL LOW (ref 3.5–5.0)
Alkaline Phosphatase: 225 U/L — ABNORMAL HIGH (ref 38–126)
Anion gap: 11 (ref 5–15)
BUN: 13 mg/dL (ref 8–23)
CO2: 20 mmol/L — ABNORMAL LOW (ref 22–32)
Calcium: 9.6 mg/dL (ref 8.9–10.3)
Chloride: 106 mmol/L (ref 98–111)
Creatinine, Ser: 1.29 mg/dL — ABNORMAL HIGH (ref 0.61–1.24)
GFR, Estimated: 55 mL/min — ABNORMAL LOW (ref >60)
Glucose, Bld: 167 mg/dL — ABNORMAL HIGH (ref 70–99)
Potassium: 4.6 mmol/L (ref 3.5–5.1)
Sodium: 137 mmol/L (ref 135–145)
Total Bilirubin: 3.2 mg/dL — ABNORMAL HIGH (ref 0.3–1.2)
Total Protein: 6 g/dL — ABNORMAL LOW (ref 6.5–8.1)

## 2021-06-24 LAB — MAGNESIUM: Magnesium: 1.5 mg/dL — ABNORMAL LOW (ref 1.7–2.4)

## 2021-06-24 LAB — CBC
HCT: 34.6 % — ABNORMAL LOW (ref 39.0–52.0)
Hemoglobin: 11.6 g/dL — ABNORMAL LOW (ref 13.0–17.0)
MCH: 31.5 pg (ref 26.0–34.0)
MCHC: 33.5 g/dL (ref 30.0–36.0)
MCV: 94 fL (ref 80.0–100.0)
Platelets: 89 10*3/uL — ABNORMAL LOW (ref 150–400)
RBC: 3.68 MIL/uL — ABNORMAL LOW (ref 4.22–5.81)
RDW: 14.3 % (ref 11.5–15.5)
WBC: 8.3 10*3/uL (ref 4.0–10.5)
nRBC: 0 % (ref 0.0–0.2)

## 2021-06-24 LAB — PROCALCITONIN: Procalcitonin: 17.14 ng/mL

## 2021-06-24 LAB — LACTIC ACID, PLASMA
Lactic Acid, Venous: 3.5 mmol/L (ref 0.5–1.9)
Lactic Acid, Venous: 2.3 mmol/L (ref 0.5–1.9)
Lactic Acid, Venous: 1.6 mmol/L (ref 0.5–1.9)

## 2021-06-24 LAB — URINALYSIS, ROUTINE W REFLEX MICROSCOPIC
Bilirubin Urine: NEGATIVE
Glucose, UA: NEGATIVE mg/dL
Ketones, ur: NEGATIVE mg/dL
Leukocytes,Ua: NEGATIVE
Nitrite: NEGATIVE
Protein, ur: NEGATIVE mg/dL
Specific Gravity, Urine: 1.019 (ref 1.005–1.030)
pH: 5 (ref 5.0–8.0)

## 2021-06-24 LAB — RESP PANEL BY RT-PCR (FLU A&B, COVID) ARPGX2
Influenza A by PCR: NEGATIVE
Influenza B by PCR: NEGATIVE
SARS Coronavirus 2 by RT PCR: NEGATIVE

## 2021-06-24 LAB — HEMOGLOBIN AND HEMATOCRIT, BLOOD
HCT: 35.9 % — ABNORMAL LOW (ref 39.0–52.0)
Hemoglobin: 12.1 g/dL — ABNORMAL LOW (ref 13.0–17.0)

## 2021-06-24 LAB — OCCULT BLOOD GASTRIC / DUODENUM (SPECIMEN CUP)
Occult Blood, Gastric: POSITIVE — AB
pH, Gastric: 3

## 2021-06-24 LAB — PHOSPHORUS: Phosphorus: 2.4 mg/dL — ABNORMAL LOW (ref 2.5–4.6)

## 2021-06-24 LAB — GLUCOSE, CAPILLARY: Glucose-Capillary: 112 mg/dL — ABNORMAL HIGH (ref 70–99)

## 2021-06-24 MED ORDER — LORAZEPAM 2 MG/ML IJ SOLN
1.0000 mg | INTRAMUSCULAR | Status: AC | PRN
  Administered 2021-06-24: 2 mg via INTRAVENOUS
  Administered 2021-06-24: 1 mg via INTRAVENOUS
  Filled 2021-06-24 (×2): qty 1

## 2021-06-24 MED ORDER — THIAMINE HCL 100 MG/ML IJ SOLN
100.0000 mg | Freq: Every day | INTRAMUSCULAR | Status: DC
  Administered 2021-06-24 – 2021-07-03 (×5): 100 mg via INTRAVENOUS
  Filled 2021-06-24 (×8): qty 2

## 2021-06-24 MED ORDER — THIAMINE HCL 100 MG PO TABS
100.0000 mg | ORAL_TABLET | Freq: Every day | ORAL | Status: DC
  Administered 2021-06-25 – 2021-07-08 (×10): 100 mg via ORAL
  Filled 2021-06-24 (×10): qty 1

## 2021-06-24 MED ORDER — ONDANSETRON HCL 4 MG/2ML IJ SOLN
4.0000 mg | Freq: Once | INTRAMUSCULAR | Status: AC
  Administered 2021-06-24: 4 mg via INTRAVENOUS
  Filled 2021-06-24: qty 2

## 2021-06-24 MED ORDER — LORAZEPAM 1 MG PO TABS
1.0000 mg | ORAL_TABLET | ORAL | Status: AC | PRN
  Administered 2021-06-26 – 2021-06-27 (×7): 1 mg via ORAL
  Filled 2021-06-24 (×7): qty 1

## 2021-06-24 MED ORDER — MAGNESIUM SULFATE 2 GM/50ML IV SOLN
2.0000 g | Freq: Once | INTRAVENOUS | Status: AC
Start: 2021-06-24 — End: 2021-06-24
  Administered 2021-06-24: 2 g via INTRAVENOUS
  Filled 2021-06-24: qty 50

## 2021-06-24 MED ORDER — POTASSIUM PHOSPHATES 15 MMOLE/5ML IV SOLN: 30.0000 mmol | Freq: Once | INTRAVENOUS | Status: DC

## 2021-06-24 MED ORDER — METOCLOPRAMIDE HCL 5 MG/ML IJ SOLN
10.0000 mg | Freq: Four times a day (QID) | INTRAMUSCULAR | Status: DC | PRN
Start: 2021-06-24 — End: 2021-07-09
  Administered 2021-06-24 (×2): 10 mg via INTRAVENOUS
  Filled 2021-06-24 (×2): qty 2

## 2021-06-24 MED ORDER — FOLIC ACID 1 MG PO TABS
1.0000 mg | ORAL_TABLET | Freq: Every day | ORAL | Status: DC
  Administered 2021-06-25 – 2021-07-08 (×13): 1 mg via ORAL
  Filled 2021-06-24 (×13): qty 1

## 2021-06-24 MED ORDER — PROCHLORPERAZINE MALEATE 10 MG PO TABS
10.0000 mg | ORAL_TABLET | Freq: Four times a day (QID) | ORAL | Status: DC | PRN
Start: 2021-06-24 — End: 2021-07-09
  Administered 2021-06-26: 10 mg via ORAL
  Filled 2021-06-24: qty 2
  Filled 2021-06-24: qty 1

## 2021-06-24 MED ORDER — PANTOPRAZOLE SODIUM 40 MG IV SOLR
40.0000 mg | Freq: Two times a day (BID) | INTRAVENOUS | Status: DC
  Administered 2021-06-24 – 2021-07-08 (×28): 40 mg via INTRAVENOUS
  Filled 2021-06-24 (×28): qty 10

## 2021-06-24 MED ORDER — SODIUM PHOSPHATES 45 MMOLE/15ML IV SOLN
30.0000 mmol | Freq: Once | INTRAVENOUS | Status: AC
  Administered 2021-06-24: 30 mmol via INTRAVENOUS
  Filled 2021-06-24 (×2): qty 10

## 2021-06-24 MED ORDER — ADULT MULTIVITAMIN W/MINERALS CH
1.0000 | ORAL_TABLET | Freq: Every day | ORAL | Status: DC
  Administered 2021-06-25 – 2021-07-08 (×12): 1 via ORAL
  Filled 2021-06-24 (×13): qty 1

## 2021-06-24 MED ORDER — CHLORHEXIDINE GLUCONATE CLOTH 2 % EX PADS
6.0000 | MEDICATED_PAD | Freq: Every day | CUTANEOUS | Status: DC
  Administered 2021-06-24 – 2021-06-26 (×3): 6 via TOPICAL

## 2021-06-24 MED ORDER — PANTOPRAZOLE SODIUM 40 MG IV SOLR
40.0000 mg | Freq: Every day | INTRAVENOUS | Status: DC
  Administered 2021-06-24: 40 mg via INTRAVENOUS
  Filled 2021-06-24: qty 10

## 2021-06-24 MED ORDER — DIPHENHYDRAMINE HCL 50 MG/ML IJ SOLN
25.0000 mg | Freq: Four times a day (QID) | INTRAMUSCULAR | Status: DC | PRN
  Administered 2021-06-24 (×2): 25 mg via INTRAVENOUS
  Filled 2021-06-24 (×2): qty 1

## 2021-06-24 MED ORDER — ENOXAPARIN SODIUM 40 MG/0.4ML IJ SOSY
40.0000 mg | PREFILLED_SYRINGE | INTRAMUSCULAR | Status: DC
  Administered 2021-06-24: 40 mg via SUBCUTANEOUS
  Filled 2021-06-24: qty 0.4

## 2021-06-24 MED ORDER — LORAZEPAM 2 MG/ML IJ SOLN
1.0000 mg | Freq: Once | INTRAMUSCULAR | Status: AC
  Administered 2021-06-24: 1 mg via INTRAVENOUS
  Filled 2021-06-24: qty 1

## 2021-06-24 MED ORDER — LACTATED RINGERS IV SOLN: INTRAVENOUS | Status: DC

## 2021-06-24 NOTE — Progress Notes (Signed)
?PROGRESS NOTE ? ? ?Jon Bennett  [id number] DOB: July 02, 1934 DOA: 06/23/2021 ?PCP: [name], MD  ? ?Chief Complaint  ?Patient presents with  ? Vomiting  ? ?Level of care: Stepdown ? ?Brief Admission History:  ?87 y.o. male with medical history significant of type 2 diabetes mellitus, hypertension, splenic vein thrombosis, alcohol withdrawal, GERD who presents to the emergency department due to several days of onset of nausea and vomiting and increased urinary frequency.  He denies any abdominal pain, chest pain, headache, blurry vision, shortness of breath.  Patient states that he has abstained from alcohol for several months. ?Patient was seen in the ED on 3/23 due to alcoholic gastritis, he was treated in the ED and discharged home.  Apparently, patient had a pint of vodka about 4 days prior to the ED visit.  ?  ?ED Course:  ?In the emergency department, patient was febrile with a temperature of 100.76F, he was tachycardic, tachypneic work-up in the ED showed leukopenia (WBC 3.5), platelets 109, BUN/creatinine 16/1.48 (baseline creatinine at 0.6-1.0), lactic acid 3.3 > 4.5 > 3.5, phosphorus 2.4, magnesium 1.5, gastric occult blood was positive. ?CT abdomen and pelvis with contrast showed gallbladder distention with multiple stones, no inflammation. Slightly enlarged appearing common bile duct up to 8-9 mm which may be correlated with LFTs. ?Chest x-ray showed no acute abnormality.  Patient was treated with IV cefepime, Flagyl and vancomycin.  He was treated with IV Zofran, IV hydration was provided, magnesium was replenished. ? ?His blood cultures 4/4 positive for gram negative rods.   ?  ?Assessment and Plan: ?* Intractable nausea and vomiting ?--continue supportive measures  ? ?Chronic anticoagulation ?--temporarily holding all anticoagulation  ?--monitor for further bleeding complications ? ?Acute cholangitis ?--continue aggressive sepsis management ?--continue IV antibiotics ?--follow culture and  sensitivities  ? ?Hypophosphatemia ?--IV replacement ordered 4/12, recheck in AM  ? ?AKI (acute kidney injury) (HCC) ?--hydrating with IV fluids ?--recheck BMP in AM  ? ?GI bleed ?--appreciate GI consultation, tentative plan for EGD in AM ? ?Transaminitis ?--following LFTs ? ?Hypercalcemia ?--hydration with IV fluid, recheck in AM ? ?Hypomagnesemia ?--IV replacement ordered, recheck in AM  ? ?Sepsis (HCC) ?--continue sepsis protcol ?--lactate has normalized now ? ?GERD (gastroesophageal reflux disease) ?--protonix for GI protection  ? ?Thrombocytopenia (HCC) ?--stable, no active bleeding ?--recheck CBC in AM  ? ? ?DVT prophylaxis: SCDs ?Code Status: Full  ?Family Communication:  ?Disposition: Status is: Inpatient ?Remains inpatient appropriate because: IV Fluid, IV antibiotics required.  ?  ?Consultants:  ? ?Procedures:  ? ?Antimicrobials:  ?Cefepime 4/11> ?Vancomycin 4/11-4/12   ?Subjective: ?Reports dry mouth, tongue pain, no other complaints.  ?Objective: ?Vitals:  ? 06/24/21 1200 06/24/21 1330 06/24/21 1415 06/24/21 1618  ?BP: 118/69 130/90  122/78  ?Pulse: 98 82  (!) 102  ?Resp: (!) 29 (!) 21 20   ?Temp:   98 ?F (36.7 ?C)   ?TempSrc:   Oral   ?SpO2: 99% 100%    ?Weight:      ?Height:      ? ? ?Intake/Output Summary (Last 24 hours) at 06/24/2021 1647 ?Last data filed at 06/24/2021 1620 ?Gross per 24 hour  ?Intake 4509.37 ml  ?Output --  ?Net 4509.37 ml  ? ?Filed Weights  ? 06/23/21 2007  ?Weight: 68 kg  ? ?Examination: ? ?General exam: Appears calm and comfortable, pt is confused with clear dementia.  ?Respiratory system: Clear to auscultation. Respiratory effort normal. ?Cardiovascular system: normal S1 & S2 heard. No JVD, murmurs, rubs, gallops  or clicks. No pedal edema. ?Gastrointestinal system: Abdomen is nondistended, soft and nontender. No organomegaly or masses felt. Normal bowel sounds heard. ?Central nervous system: Alert and oriented to person. No focal neurological deficits. ?Extremities: Symmetric 5 x  5 power. ?Skin: No rashes, lesions or ulcers. ?Psychiatry: Judgement and insight appear normal. Mood & affect appropriate.  ? ?Data Reviewed: I have personally reviewed following labs and imaging studies ? ?CBC: ?Recent Labs  ?Lab 06/23/21 ?2045 06/24/21 ?0259 06/24/21 ?[id number]  ?WBC 3.5* 8.3  --   ?NEUTROABS 3.2  --   --   ?HGB 13.2 11.6* 12.1*  ?HCT 37.9* 34.6* 35.9*  ?MCV 91.1 94.0  --   ?PLT 109* 89*  --   ? ? ?Basic Metabolic Panel: ?Recent Labs  ?Lab 06/23/21 ?2045 06/24/21 ?0259  ?NA 137 137  ?K 4.0 4.6  ?CL 107 106  ?CO2 22 20*  ?GLUCOSE 142* 167*  ?BUN 16 13  ?CREATININE 1.48* 1.29*  ?CALCIUM 10.4* 9.6  ?MG  --  1.5*  ?PHOS  --  2.4*  ? ? ?CBG: ?No results for input(s): GLUCAP in the last 168 hours. ? ?Recent Results (from the past 240 hour(s))  ?Blood Culture (routine x 2)     Status: None (Preliminary result)  ? Collection Time: 06/23/21  8:45 PM  ? Specimen: BLOOD RIGHT FOREARM  ?Result Value Ref Range Status  ? Specimen Description   Final  ?  BLOOD RIGHT FOREARM ?Performed at [facility], [address] [place], [place] [address] ?  ? Special Requests   Final  ?  BOTTLES DRAWN AEROBIC AND ANAEROBIC Blood Culture adequate volume ?Performed at [facility], [address] [place], [place] [address] ?  ? Culture  Setup Time   Final  ?  GRAM NEGATIVE RODS IN BOTH AEROBIC AND ANAEROBIC BOTTLES ?Gram Stain Report Called to,Read Back By and Verified With: GOFF A. AT 1016A ON [id number] BY THOMPSON S. ?Performed at [facility], [address] [place], [place] [address] ?  ? Culture GRAM NEGATIVE RODS  Final  ? Report Status PENDING  Incomplete  ?Resp Panel by RT-PCR (Flu A&B, Covid) Nasopharyngeal Swab     Status: None  ? Collection Time: 06/23/21  8:47 PM  ? Specimen: Nasopharyngeal Swab; Nasopharyngeal(NP) swabs in vial transport medium  ?Result Value Ref Range Status  ? SARS Coronavirus 2 by RT PCR NEGATIVE NEGATIVE Final  ?  Comment: (NOTE) ?SARS-CoV-2 target nucleic acids are NOT DETECTED. ? ?The SARS-CoV-2  RNA is generally detectable in upper respiratory ?specimens during the acute phase of infection. The lowest ?concentration of SARS-CoV-2 viral copies this assay can detect is ?138 copies/mL. A negative result does not preclude SARS-Cov-2 ?infection and should not be used as the sole basis for treatment or ?other patient management decisions. A negative result may occur with  ?improper specimen collection/handling, submission of specimen other ?than nasopharyngeal swab, presence of viral mutation(s) within the ?areas targeted by this assay, and inadequate number of viral ?copies(<138 copies/mL). A negative result must be combined with ?clinical observations, patient history, and epidemiological ?information. The expected result is Negative. ? ?Fact Sheet for Patients:  ?BloggerCourse.comhttps://www.fda.gov/media/152166/download ? ?Fact Sheet for Healthcare Providers:  ?SeriousBroker.ithttps://www.fda.gov/media/152162/download ? ?This test is no t yet approved or cleared by the [place] FDA and  ?has been authorized for detection and/or diagnosis of SARS-CoV-2 by ?FDA under an Emergency Use Authorization (EUA). This EUA will remain  ?in effect (meaning this test can be used) for the duration of the ?  COVID-19 declaration under Section 564(b)(1) of the Act, 21 ?U.S.C.section 360bbb-3(b)(1), unless the authorization is terminated  ?or revoked sooner.  ? ? ?  ? Influenza A by PCR NEGATIVE NEGATIVE Final  ? Influenza B by PCR NEGATIVE NEGATIVE Final  ?  Comment: (NOTE) ?The Xpert Xpress SARS-CoV-2/FLU/RSV plus assay is intended as an aid ?in the diagnosis of influenza from Nasopharyngeal swab specimens and ?should not be used as a sole basis for treatment. Nasal washings and ?aspirates are unacceptable for Xpert Xpress SARS-CoV-2/FLU/RSV ?testing. ? ?Fact Sheet for Patients: ?BloggerCourse.com ? ?Fact Sheet for Healthcare Providers: ?SeriousBroker.it ? ?This test is not yet approved or cleared by the  [place] FDA and ?has been authorized for detection and/or diagnosis of SARS-CoV-2 by ?FDA under an Emergency Use Authorization (EUA). This EUA will remain ?in effect (meaning this test can be used) for

## 2021-06-24 NOTE — Assessment & Plan Note (Addendum)
--  IV replacement ordered, recheck Mg is 1.9  ?

## 2021-06-24 NOTE — Assessment & Plan Note (Addendum)
--   LFTs initially trended down with treatments but now rising with bilirubin, GI recalled and planning MRCP on 4/17.   ?--planning for lap chole with Dr. Lovell Sheehan - timing to be determined  ?

## 2021-06-24 NOTE — ED Notes (Signed)
Sitting up in bed and trying to get up. In view of nurses station. Bed alarm placed for safety.  ?

## 2021-06-24 NOTE — Assessment & Plan Note (Addendum)
--  resolved after hydration with IV fluid ?

## 2021-06-24 NOTE — Assessment & Plan Note (Addendum)
--  initially improved some but rapidly decompensating ?--GI team recommending ERCP, surgery recommending ERCP or IR percutaneous drainage of biliary tree ?--GI requested transfer to [facility] for ERCP. Dr. Marletta Lor reached out to GI colleagues in [place] regarding need for ERCP.   ?--continue IV antibiotics (broad spectrum Zosyn) ?--follow culture and sensitivities, repeated BC from 4/15 no growth to date ?

## 2021-06-24 NOTE — H&P (Signed)
?History and Physical  ? ? ?Patient: Jon Bennett ZOX:[id number] DOB: 03/21/1934 ?DOA: 06/23/2021 ?DOS: the patient was seen and examined on 06/24/2021 ?PCP: Julieanne CottonPaul, [name], MD  ?Patient coming from: Home ? ?Chief Complaint:  ?Chief Complaint  ?Patient presents with  ? Vomiting  ? ?HPI: Jon Bennett is a 87 y.o. male with medical history significant of type 2 diabetes mellitus, hypertension, splenic vein thrombosis, alcohol withdrawal, GERD who presents to the emergency department due to several days of onset of nausea and vomiting and increased urinary frequency.  He denies any abdominal pain, chest pain, headache, blurry vision, shortness of breath.  Patient states that he has abstained from alcohol for several months. ?Patient was seen in the ED on 3/23 due to alcoholic gastritis, he was treated in the ED and discharged home.  Apparently, patient had a pint of vodka about 4 days prior to the ED visit.  ? ?ED Course:  ?In the emergency department, patient was febrile with a temperature of 100.23F, he was tachycardic, tachypneic work-up in the ED showed leukopenia (WBC 3.5), platelets 109, BUN/creatinine 16/1.48 (baseline creatinine at 0.6-1.0), lactic acid 3.3 > 4.5 > 3.5, phosphorus 2.4, magnesium 1.5, gastric occult blood was positive. ?CT abdomen and pelvis with contrast showed gallbladder distention with multiple stones, no inflammation. Slightly enlarged appearing common bile duct up to 8-9 mm which may be correlated with LFTs. ?Chest x-ray showed no acute abnormality ?Patient was treated with IV cefepime, Flagyl and vancomycin.  He was treated with IV Zofran, IV hydration was provided, magnesium was replenished. ? ?Review of Systems: ?Review of systems as noted in the HPI. All other systems reviewed and are negative. ? ? ?Past Medical History:  ?Diagnosis Date  ? Diabetes mellitus without complication (HCC)   ? Patient is not on any medication for this  ? GERD (gastroesophageal reflux disease)   ?  Hypertension   ? Pancreatitis, alcoholic, acute   ? Splenic vein thrombosis   ? ?Past Surgical History:  ?Procedure Laterality Date  ? APPENDECTOMY    ? VASCULAR SURGERY    ? ? ?Social History:  reports that he has never smoked. He has never used smokeless tobacco. He reports that he does not currently use alcohol after a past usage of about 1.0 standard drink per week. He reports that he does not use drugs. ? ? ?Allergies  ?Allergen Reactions  ? Asa [Aspirin] Shortness Of Breath  ? ? ?Family History  ?Problem Relation Age of Onset  ? Hypertension Other   ? Heart disease Father   ? Healthy Sister   ? Healthy Brother   ? Cancer Maternal Grandfather   ? Healthy Brother   ? Healthy Brother   ? Healthy Sister   ? Healthy Sister   ? Healthy Sister   ? Healthy Brother   ? Healthy Son   ? Healthy Son   ? Healthy Son   ? Healthy Son   ?  ? ?Prior to Admission medications   ?Medication Sig Start Date End Date Taking? Authorizing Provider  ?doxepin (SINEQUAN) 25 MG capsule Take 1 tablet by mouth daily. 06/08/21  Yes [provider]  ?acamprosate (CAMPRAL) 333 MG tablet Take 666 mg by mouth 3 (three) times daily. 06/10/21   [provider]  ?acetaminophen (TYLENOL) 500 MG tablet Take 500 mg by mouth daily as needed for mild pain or moderate pain.    [provider]  ?BESIVANCE 0.6 % SUSP Place 1 drop into the left eye  3 (three) times daily. 08/21/20   [provider]  ?busPIRone (BUSPAR) 10 MG tablet Take 1 tablet (10 mg total) by mouth 2 (two) times daily. 08/27/20   Sherryll Burger, Pratik D, DO  ?diclofenac Sodium (VOLTAREN) 1 % GEL Apply 2 g topically 4 (four) times daily. 02/03/21   [provider]  ?doxepin (SINEQUAN) 25 MG capsule Take 25 mg by mouth at bedtime. 06/04/19   [provider]  ?ELIQUIS 5 MG TABS tablet TAKE 1 TABLET BY MOUTH TWICE A DAY 05/20/21   Doreatha Massed, MD  ?famotidine (PEPCID) 20 MG tablet Take 1 tablet (20 mg total) by mouth at bedtime. 06/30/19   Vassie Loll, MD  ?HYDROcodone-acetaminophen (NORCO/VICODIN) 5-325 MG tablet Take 2 tablets by mouth every 4 (four) hours as needed. 08/27/20   Sherryll Burger, Pratik D, DO  ?metoprolol tartrate (LOPRESSOR) 25 MG tablet Take 1 tablet (25 mg total) by mouth 2 (two) times daily. 08/18/18   Catarina Hartshorn, MD  ?ondansetron (ZOFRAN-ODT) 4 MG disintegrating tablet Take 1 tablet (4 mg total) by mouth every 8 (eight) hours as needed for nausea or vomiting. 06/04/21   Pollyann Savoy, MD  ?pantoprazole (PROTONIX) 40 MG tablet Take 1 tablet (40 mg total) by mouth daily. 06/04/21   Pollyann Savoy, MD  ?sertraline (ZOLOFT) 100 MG tablet Take 100 mg by mouth daily. 08/27/19   [provider]  ?tamsulosin (FLOMAX) 0.4 MG CAPS capsule Take 0.4 mg by mouth daily. 06/09/21   [provider]  ?thiamine 100 MG tablet Take 1 tablet (100 mg total) by mouth daily. 08/28/20   Sherryll Burger, Pratik D, DO  ?vitamin B-12 (CYANOCOBALAMIN) 1000 MCG tablet Take 1 tablet (1,000 mcg total) by mouth daily. 06/30/19   Vassie Loll, MD  ?Vitamin D, Ergocalciferol, (DRISDOL) 1.25 MG (50000 UNIT) CAPS capsule Take 50,000 Units by mouth every 7 (seven) days.    [provider]  ? ? ?Physical Exam: ?BP (!) 155/106   Pulse (!) 114   Temp 98.6 ?F (37 ?C) (Oral)   Resp (!) 30   Ht 5\' 9"  (1.753 m)   Wt 68 kg   SpO2 97%   BMI 22.15 kg/m?  ? ?General: 87 y.o. year-old male ill appearing but in no acute distress.  Alert and oriented x3. ?HEENT: NCAT, EOMI ?Neck: Supple, trachea medial ?Cardiovascular: Tachycardic.  Regular rate and rhythm with no rubs or gallops.  No thyromegaly or JVD noted.  No lower extremity edema. 2/4 pulses in all 4 extremities. ?Respiratory: Tachypneic.  Clear to auscultation with no wheezes or rales. Good inspiratory effort. ?Abdomen: Soft, nontender nondistended with normal bowel sounds x4 quadrants. ?Muskuloskeletal: No cyanosis, clubbing or edema noted bilaterally ?Neuro: CN II-XII intact, strength 5/5 x 4, sensation, reflexes  intact ?Skin: No ulcerative lesions noted or rashes ?Psychiatry: Mood is appropriate for condition and setting ?   ?   ?   ?Labs on Admission:  ?Basic Metabolic Panel: ?Recent Labs  ?Lab 06/23/21 ?2045 06/24/21 ?0259  ?NA 137 137  ?K 4.0 4.6  ?CL 107 106  ?CO2 22 20*  ?GLUCOSE 142* 167*  ?BUN 16 13  ?CREATININE 1.48* 1.29*  ?CALCIUM 10.4* 9.6  ?MG  --  1.5*  ?PHOS  --  2.4*  ? ?Liver Function Tests: ?Recent Labs  ?Lab 06/23/21 ?2045 06/24/21 ?0259  ?AST 158* 139*  ?ALT 55* 56*  ?ALKPHOS 286* 225*  ?BILITOT 4.0* 3.2*  ?PROT 7.2 6.0*  ?ALBUMIN 3.6 2.9*  ? ?No results for input(s): LIPASE,  AMYLASE in the last 168 hours. ?No results for input(s): AMMONIA in the last 168 hours. ?CBC: ?Recent Labs  ?Lab 06/23/21 ?2045 06/24/21 ?0259  ?WBC 3.5* 8.3  ?NEUTROABS 3.2  --   ?HGB 13.2 11.6*  ?HCT 37.9* 34.6*  ?MCV 91.1 94.0  ?PLT 109* 89*  ? ?Cardiac Enzymes: ?No results for input(s): CKTOTAL, CKMB, CKMBINDEX, TROPONINI in the last 168 hours. ? ?BNP (last 3 results) ?No results for input(s): BNP in the last 8760 hours. ? ?ProBNP (last 3 results) ?No results for input(s): PROBNP in the last 8760 hours. ? ?CBG: ?No results for input(s): GLUCAP in the last 168 hours. ? ?Radiological Exams on Admission: ?CT ABDOMEN PELVIS W CONTRAST ? ?Result Date: 06/24/2021 ?CLINICAL DATA:  Abdomen pain nausea vomiting EXAM: CT ABDOMEN AND PELVIS WITH CONTRAST TECHNIQUE: Multidetector CT imaging of the abdomen and pelvis was performed using the standard protocol following bolus administration of intravenous contrast. RADIATION DOSE REDUCTION: This exam was performed according to the departmental dose-optimization program which includes automated exposure control, adjustment of the mA and/or kV according to patient size and/or use of iterative reconstruction technique. CONTRAST:  59mL OMNIPAQUE IOHEXOL 300 MG/ML  SOLN COMPARISON:  CT 04/21/2020 FINDINGS: Lower chest: Lung bases demonstrate scarring or atelectasis in the left lung base. Moderate to  large hiatal hernia. Normal cardiac size. Hepatobiliary: No focal hepatic abnormality. Distended gallbladder with multiple stones. No inflammatory changes. Slightly enlarged common bile duct up to 8-9 mm. No calcified

## 2021-06-24 NOTE — Assessment & Plan Note (Addendum)
--  temporarily holding all anticoagulation for 72 hours per GI recommendations but also in anticipation of upcoming procedures and also concern about thrombocytopenia.   ?--monitor for bleeding complications ?

## 2021-06-24 NOTE — Consult Note (Addendum)
$'@LOGO'J$ @ ? ? ?Referring Provider: Bernadette Hoit, DO ?Primary Care Physician:  Mertha Baars, MD ?Primary Gastroenterologist:  Dr. Laural Golden (previously unassigned) ? ?Date of Admission: 06/23/2021 ?Date of Consultation: 06/24/2021 ? ?Reason for Consultation:  Heme positive stool, coffee ground emesis, transaminates, sepsis, possible cholangitis.  ? ?HPI:  ?Jon Bennett is a 87 y.o. year old male with history of stroke years ago (per son), type 2 diabetes, HTN, splenic vein thrombosis, alcohol use disorder, GERD, who presented to the ED on 4/11 with chief complaint of nausea and vomiting x3 weeks as well as increased urinary frequency. ? ?Notably, he was previously seen in the emergency room on 3/23 after having 1/2 pint of Vodka 4 days prior and subsequently had developed vomiting since that time.  He was found to have elevated LFTs at that time with AST 120, ALT 79, alk phos 200, total bilirubin 6.1.  Lipase normal.  WBC count was elevated at 10.8.  No imaging pursued.  He was diagnosed with acute alcoholic gastritis and started on Protonix daily and Zofran as needed. ? ?ED Course:  ?Hypertensive on presentation with BP 199/87, tachycardic at 125, tachypneic at 23, temp 100.7.  O2 saturation dropped to 88% in the ED and he was placed on 3 L [place]. ? ?Labs reveal white blood cell count 3.5, hemoglobin 13.2, platelets 109, AST 158, ALT 55, alk phos 286, total bilirubin 4.0, creatinine 1.48 (up from 0.96).  INR 1.2. ?Lactic acid 3.3 >> 4.5  ?Respiratory panel negative. ?Blood cultures drawn (no growth <12 hours) ?UA with large hemoglobin and rare bacteria.  Urine culture in process. ? ?CT A/P with contrast with moderate to large hiatal hernia, normal-appearing liver, distended gallbladder with multiple stones without inflammatory changes, slightly enlarged CBD up to 8-9 mm with no calcified stones along the course of the duct, pancreatic atrophy ? ?Repeat labs at 3 AM this morning with hemoglobin down to 11.6, platelets  89, creatinine 1.29, AST 139, ALT 56, alk phos 225, total bilirubin 3.2.  Lactic acid down to 3.5.  Magnesium low at 1.5, phosphorus low at 2.4. ?Fecal occult blood positive. ? ?Started on broad-spectrum antibiotics with vancomycin and cefepime per pharmacy. Also received 2.25L bolus of LR and started on 150 cc/hr LR.  ? ?RUQ ultrasound this morning with distended gallbladder with cholelithiasis and sludge without gallbladder wall thickening or pericholecystic fluid.  CBD 6 mm. ? ? ?Consult:  ?Patient reports 2 days of nausea and vomiting. States prior nausea and vomiting resolved after his las ED evaluation until the last couple of days. Denies abdominal pain, history of heartburn symptoms, or dysphagia.  Denies hematemesis or coffee-ground emesis though he states he does not look.  Bowels been moving okay with no known hematochezia or melena though again he does not look.  Reports he takes Advil every now and then, but cannot tell me how often.  This is for chronic neck pain.  ? ?Reports he has not drank any alcohol since prior to his last ER visit.  No history of illicit drug use or tobacco use.  Unable to tell me the medications he takes at home. ? ?Patient reports he is never had an EGD or colonoscopy. ? ?Per nursing, he has had coffee ground emesis with last episode a few hours ago. ? ?Spoke with patient's son:  ?Reports dad hasn't been eating well since ED eval on 3/23. Hasn't eaten much at all since Saturday. Just jello and chicken broth since then. Vomiting not as much with  Zofran at home. Didn't notice hematemesis, but often father flushed toilet before he saw.   ? ?Current Meds:  ?Hasn't tried taken Eliquis since Saturday.  ?Has been taking Protonix ?OTC supplements: No herbals.  ?Tylenol: Daily- 2000 mg daily.  ?No NSAIDs. Was told he was allergic to aspirin when he was younger.  ? ?EGD/TCS: No TCS that son is aware of. Thinks he had an EGD in 1991 and they punctured his esophagus and required surgery.  Reports he has problems with his esophagus. Think he may have been treated for a bleeding stomach ulcers when he was younger.  ? ?Past Medical History:  ?Diagnosis Date  ? Diabetes mellitus without complication ([place])   ? Patient is not on any medication for this  ? GERD (gastroesophageal reflux disease)   ? Hypertension   ? Pancreatitis, alcoholic, acute   ? Splenic vein thrombosis   ? ? ?Past Surgical History:  ?Procedure Laterality Date  ? APPENDECTOMY    ? VASCULAR SURGERY    ? ? ?Prior to Admission medications   ?Medication Sig Start Date End Date Taking? Authorizing Provider  ?acamprosate (CAMPRAL) 333 MG tablet Take 666 mg by mouth 3 (three) times daily. 06/10/21  Yes [provider]  ?acetaminophen (TYLENOL) 500 MG tablet Take 500 mg by mouth daily as needed for mild pain or moderate pain.   Yes [provider]  ?busPIRone (BUSPAR) 10 MG tablet Take 1 tablet (10 mg total) by mouth 2 (two) times daily. 08/27/20  Yes Shah, Pratik D, DO  ?doxepin (SINEQUAN) 25 MG capsule Take 25 mg by mouth at bedtime. 06/04/19  Yes [provider]  ?ELIQUIS 5 MG TABS tablet TAKE 1 TABLET BY MOUTH TWICE A DAY 05/20/21  Yes Derek Jack, MD  ?famotidine (PEPCID) 20 MG tablet Take 1 tablet (20 mg total) by mouth at bedtime. 06/30/19  Yes Barton Dubois, MD  ?metoprolol tartrate (LOPRESSOR) 25 MG tablet Take 1 tablet (25 mg total) by mouth 2 (two) times daily. 08/18/18  Yes Tat, Shanon Brow, MD  ?pantoprazole (PROTONIX) 40 MG tablet Take 1 tablet (40 mg total) by mouth daily. 06/04/21  Yes Truddie Hidden, MD  ?sertraline (ZOLOFT) 100 MG tablet Take 100 mg by mouth daily. 08/27/19  Yes [provider]  ?tamsulosin (FLOMAX) 0.4 MG CAPS capsule Take 0.4 mg by mouth daily. 06/09/21  Yes [provider]  ?diclofenac Sodium (VOLTAREN) 1 % GEL Apply 2 g topically 4 (four) times daily. ?Patient not taking: Reported on 06/24/2021 02/03/21   [provider]  ?HYDROcodone-acetaminophen  (NORCO/VICODIN) 5-325 MG tablet Take 2 tablets by mouth every 4 (four) hours as needed. ?Patient not taking: Reported on 06/24/2021 08/27/20   Heath Lark D, DO  ?ondansetron (ZOFRAN-ODT) 4 MG disintegrating tablet Take 1 tablet (4 mg total) by mouth every 8 (eight) hours as needed for nausea or vomiting. ?Patient not taking: Reported on 06/24/2021 06/04/21   Truddie Hidden, MD  ?thiamine 100 MG tablet Take 1 tablet (100 mg total) by mouth daily. ?Patient not taking: Reported on 06/24/2021 08/28/20   Heath Lark D, DO  ?vitamin B-12 (CYANOCOBALAMIN) 1000 MCG tablet Take 1 tablet (1,000 mcg total) by mouth daily. ?Patient not taking: Reported on 06/24/2021 06/30/19   Barton Dubois, MD  ?Vitamin D, Ergocalciferol, (DRISDOL) 1.25 MG (50000 UNIT) CAPS capsule Take 50,000 Units by mouth every 7 (seven) days. ?Patient not taking: Reported on 06/24/2021    [provider]  ? ? ?Current Facility-Administered Medications  ?Medication Dose  Route Frequency Provider Last Rate Last Admin  ? ceFEPIme (MAXIPIME) 2 g in sodium chloride 0.9 % 100 mL IVPB  2 g Intravenous Q12H Adefeso, Oladapo, DO      ? diphenhydrAMINE (BENADRYL) injection 25 mg  25 mg Intravenous Q6H PRN Adefeso, Oladapo, DO   25 mg at 06/24/21 0600  ? lactated ringers infusion   Intravenous Continuous Adefeso, Oladapo, DO 150 mL/hr at 06/23/21 2230 New Bag at 06/23/21 2230  ? metoCLOPramide (REGLAN) injection 10 mg  10 mg Intravenous Q6H PRN Adefeso, Oladapo, DO   10 mg at 06/24/21 0601  ? pantoprazole (PROTONIX) injection 40 mg  40 mg Intravenous Daily Adefeso, Oladapo, DO      ? prochlorperazine (COMPAZINE) tablet 10 mg  10 mg Oral Q6H PRN Adefeso, Oladapo, DO      ? sodium phosphate 30 mmol in dextrose 5 % 250 mL infusion  30 mmol Intravenous Once Adefeso, Oladapo, DO      ? [START ON 06/25/2021] vancomycin (VANCOREADY) IVPB 1250 mg/250 mL  1,250 mg Intravenous Q36H Adefeso, Oladapo, DO      ? ?Current Outpatient Medications  ?Medication Sig Dispense  Refill  ? acamprosate (CAMPRAL) 333 MG tablet Take 666 mg by mouth 3 (three) times daily.    ? acetaminophen (TYLENOL) 500 MG tablet Take 500 mg by mouth daily as needed for mild pain or moderate pain.    ? busPIRone

## 2021-06-24 NOTE — Progress Notes (Signed)
PHARMACY - PHYSICIAN COMMUNICATION ?CRITICAL VALUE ALERT - BLOOD CULTURE IDENTIFICATION (BCID) ? ?Jon Bennett is an 87 y.o. male who presented to [facility] on 06/23/2021 with a chief complaint of N/V ? ?Assessment:  2 sets of blood cultures drawn on 4/11 growing Ecoli/Enterobacter - no resistance detected ? ?Name of physician Contacted: Dr. Standley Dakins ? ?Current antibiotics: Vancomycin and Cefepime ? ?Changes to prescribed antibiotics recommended:  ?DC Vancomycin ?Await final sensitivities and narrow Abx accordingly ? ?Results for orders placed or performed during the hospital encounter of 06/23/21  ?Blood Culture ID Panel (Reflexed) (Collected: 06/23/2021  8:48 PM)  ?Result Value Ref Range  ? Enterococcus faecalis NOT DETECTED NOT DETECTED  ? Enterococcus Faecium NOT DETECTED NOT DETECTED  ? Listeria monocytogenes NOT DETECTED NOT DETECTED  ? Staphylococcus species NOT DETECTED NOT DETECTED  ? Staphylococcus aureus (BCID) NOT DETECTED NOT DETECTED  ? Staphylococcus epidermidis NOT DETECTED NOT DETECTED  ? Staphylococcus lugdunensis NOT DETECTED NOT DETECTED  ? Streptococcus species NOT DETECTED NOT DETECTED  ? Streptococcus agalactiae NOT DETECTED NOT DETECTED  ? Streptococcus pneumoniae NOT DETECTED NOT DETECTED  ? Streptococcus pyogenes NOT DETECTED NOT DETECTED  ? A.calcoaceticus-baumannii NOT DETECTED NOT DETECTED  ? Bacteroides fragilis NOT DETECTED NOT DETECTED  ? Enterobacterales DETECTED (A) NOT DETECTED  ? Enterobacter cloacae complex NOT DETECTED NOT DETECTED  ? Escherichia coli DETECTED (A) NOT DETECTED  ? Klebsiella aerogenes NOT DETECTED NOT DETECTED  ? Klebsiella oxytoca NOT DETECTED NOT DETECTED  ? Klebsiella pneumoniae NOT DETECTED NOT DETECTED  ? Proteus species NOT DETECTED NOT DETECTED  ? Salmonella species NOT DETECTED NOT DETECTED  ? Serratia marcescens NOT DETECTED NOT DETECTED  ? Haemophilus influenzae NOT DETECTED NOT DETECTED  ? Neisseria meningitidis NOT DETECTED NOT DETECTED  ?  Pseudomonas aeruginosa NOT DETECTED NOT DETECTED  ? Stenotrophomonas maltophilia NOT DETECTED NOT DETECTED  ? Candida albicans NOT DETECTED NOT DETECTED  ? Candida auris NOT DETECTED NOT DETECTED  ? Candida glabrata NOT DETECTED NOT DETECTED  ? Candida krusei NOT DETECTED NOT DETECTED  ? Candida parapsilosis NOT DETECTED NOT DETECTED  ? Candida tropicalis NOT DETECTED NOT DETECTED  ? Cryptococcus neoformans/gattii NOT DETECTED NOT DETECTED  ? CTX-M ESBL NOT DETECTED NOT DETECTED  ? Carbapenem resistance IMP NOT DETECTED NOT DETECTED  ? Carbapenem resistance KPC NOT DETECTED NOT DETECTED  ? Carbapenem resistance NDM NOT DETECTED NOT DETECTED  ? Carbapenem resist OXA 48 LIKE NOT DETECTED NOT DETECTED  ? Carbapenem resistance VIM NOT DETECTED NOT DETECTED  ? ? ?Caryl Asp, PharmD ?Clinical Pharmacist ?06/24/2021 3:41 PM ? ? ? ? ? ? ? ?

## 2021-06-24 NOTE — Assessment & Plan Note (Addendum)
--   no active bleeding, platelets down to 62 today ?-- pt may need a platelet transfusion prior to any procedures   ?

## 2021-06-24 NOTE — TOC Progression Note (Signed)
?  Transition of Care (TOC) Screening Note ? ? ?Patient Details  ?Name: Jon Bennett ?Date of Birth: [birth year] ? ? ?Transition of Care (TOC) CM/SW Contact:    ?Elliot Gault, LCSW ?Phone Number: ?06/24/2021, 1:05 PM ? ? ? ?Transition of Care Department [facility]) has reviewed patient and no TOC needs have been identified at this time. We will continue to monitor patient advancement through interdisciplinary progression rounds. If new patient transition needs arise, please place a TOC consult. ? ? ?

## 2021-06-24 NOTE — Sepsis Progress Note (Signed)
Notified provider of need to order repeat lactic acid. ° °

## 2021-06-24 NOTE — Assessment & Plan Note (Addendum)
--  continue supportive measures  ?-- no emesis since admission  ?

## 2021-06-24 NOTE — ED Notes (Signed)
Patient trying to sit up and get out of bed. Bed alarm remains in place. Confused. Pulling at external cath.  ?

## 2021-06-24 NOTE — ED Notes (Signed)
Pt placed in soft mitts to prevent pt from pulling at lines ?

## 2021-06-24 NOTE — ED Notes (Addendum)
Pt moved to room 18 for increased visibility due to confusion and taking off equipment ?

## 2021-06-24 NOTE — Assessment & Plan Note (Addendum)
--  IV replacement ordered 4/12 and repleted.    ?

## 2021-06-24 NOTE — ED Notes (Signed)
Pt placed on 3L [place] due to pt O2 sat at 88%. Now O2 sat is 93% ?

## 2021-06-24 NOTE — Assessment & Plan Note (Addendum)
--  appreciate GI consultation, EGD 4/13 with findings of small mallory weiss tear ?--continue protonix as ordered for GI protection ?

## 2021-06-24 NOTE — Assessment & Plan Note (Signed)
protonix for GI protection ?

## 2021-06-24 NOTE — Hospital Course (Addendum)
87 y.o. male with medical history significant of type 2 diabetes mellitus, hypertension, splenic vein thrombosis, alcohol withdrawal, GERD who presents to the emergency department due to several days of onset of nausea and vomiting and increased urinary frequency.  He denies any abdominal pain, chest pain, headache, blurry vision, shortness of breath.  Patient states that he has abstained from alcohol for several months. Patient was seen in the ED on 3/23 due to alcoholic gastritis, he was treated in the ED and discharged home.  Apparently, patient had a pint of vodka about 4 days prior to the ED visit.  ?  ?ED Course:  ?In the emergency department, patient was febrile with a temperature of 100.28F, he was tachycardic, tachypneic work-up in the ED showed leukopenia (WBC 3.5), platelets 109, BUN/creatinine 16/1.48 (baseline creatinine at 0.6-1.0), lactic acid 3.3 > 4.5 > 3.5, phosphorus 2.4, magnesium 1.5, gastric occult blood was positive.  CT abdomen and pelvis with contrast showed gallbladder distention with multiple stones, no inflammation. Slightly enlarged appearing common bile duct up to 8-9 mm which may be correlated with LFTs.  Chest x-ray showed no acute abnormality.  Patient was treated with IV cefepime, Flagyl and vancomycin.  He was treated with IV Zofran, IV hydration was provided, magnesium was replenished. ? ?His blood cultures 4/11 positive for E coli. Pharmacy changed abx to ceftriaxone on 4/13.    ? ?06/26/2021 : surgery planning for cholecystectomy in next couple of days. Narrowed antibiotics for E colic bacteremia to cefazolin starting on 4/14.   ? ?06/27/2021: spike in bilirubin to 4.1 today.  GI planning MRCP on 4/17.  Timing of cholecystectomy to be determined after further testing.   Pt developed worsening sepsis, spiked fever, ammonia elevated, WBC and procalcitonin rising. Transferred to stepdown ICU.  Spoke with son and DNR order confirmed.  Repeated blood cultures. Broadened antibiotics.    ? ?06/28/2021:  Worsening encephalopathy today, platelets down to 52.  WBC and procalcitonin rising.  Conferenced with GI and surgery.  Recommending ERCP but GI reports he is too complex to have done at [facility].  Ordered for transfer to [facility] or [facility] per GI. Dr. Marletta Lor reaching out to GI team in [place].  I spoke with family if they wanted to proceed with ERCP.  I spoke with son [name] who is agreeable to ERCP understanding that there are significant risks involved.  He was agreeable to transfer to [facility].  ? ?06/29/2021: pt still waiting for transfer to [facility] for ERCP.  Cancelled NPO order and allowed to eat.  Ammonia coming down.  I updated son [name] about situation with transfer. Pt clinically appears nontoxic.  Bilirubin remains elevated but slightly improved today.   ? ?06/30/2021: bilirubin down to 2.6 today.  Encephalopathy improved and lactulose discontinued due to loose stools.   ?

## 2021-06-24 NOTE — Assessment & Plan Note (Addendum)
--  E coli now transitioned to IV Zosyn given deterioration in condition, worsening sepsis.   ?--lactate has normalized  ?--Suspect he has ascending cholangitis and transferring to [facility] for ERCP ?--follow up repeated blood cultures ?

## 2021-06-24 NOTE — ED Notes (Signed)
Pulling at lines and covers off. Mittens in place from previous shift. Patient vomited approximately of dark brown coffee ground appearing emesis. Provider informed.  ?

## 2021-06-24 NOTE — Assessment & Plan Note (Addendum)
--  RESOLVED after hydrating with IV fluids ? ?

## 2021-06-24 NOTE — ED Notes (Signed)
Resting quietly. No signs of distress. Confused. Side rails up. Call bell in reach.  ?

## 2021-06-25 ENCOUNTER — Encounter (HOSPITAL_COMMUNITY): Payer: Self-pay | Admitting: Family Medicine

## 2021-06-25 ENCOUNTER — Encounter (HOSPITAL_COMMUNITY)
Admission: EM | Disposition: A | Discharge status: Skilled Nursing Facility | Payer: Self-pay | Source: Home / Self Care | Attending: Internal Medicine

## 2021-06-25 ENCOUNTER — Inpatient Hospital Stay (HOSPITAL_COMMUNITY): Payer: Medicare Other | Admitting: Anesthesiology

## 2021-06-25 DIAGNOSIS — K5710 Diverticulosis of small intestine without perforation or abscess without bleeding: Secondary | ICD-10-CM

## 2021-06-25 DIAGNOSIS — N179 Acute kidney failure, unspecified: Secondary | ICD-10-CM | POA: Diagnosis not present

## 2021-06-25 DIAGNOSIS — K8309 Other cholangitis: Secondary | ICD-10-CM | POA: Diagnosis not present

## 2021-06-25 DIAGNOSIS — K226 Gastro-esophageal laceration-hemorrhage syndrome: Secondary | ICD-10-CM

## 2021-06-25 DIAGNOSIS — K2100 Gastro-esophageal reflux disease with esophagitis, without bleeding: Secondary | ICD-10-CM

## 2021-06-25 DIAGNOSIS — K449 Diaphragmatic hernia without obstruction or gangrene: Secondary | ICD-10-CM | POA: Diagnosis not present

## 2021-06-25 DIAGNOSIS — R112 Nausea with vomiting, unspecified: Secondary | ICD-10-CM | POA: Diagnosis not present

## 2021-06-25 DIAGNOSIS — K920 Hematemesis: Secondary | ICD-10-CM

## 2021-06-25 DIAGNOSIS — Z7901 Long term (current) use of anticoagulants: Secondary | ICD-10-CM | POA: Diagnosis not present

## 2021-06-25 DIAGNOSIS — K2980 Duodenitis without bleeding: Secondary | ICD-10-CM | POA: Diagnosis not present

## 2021-06-25 HISTORY — PX: ESOPHAGOGASTRODUODENOSCOPY (EGD) WITH PROPOFOL: SHX5813

## 2021-06-25 LAB — CBC WITH DIFFERENTIAL/PLATELET
Abs Immature Granulocytes: 0.01 10*3/uL (ref 0.00–0.07)
Basophils Absolute: 0 10*3/uL (ref 0.0–0.1)
Basophils Relative: 0 %
Eosinophils Absolute: 0.1 10*3/uL (ref 0.0–0.5)
Eosinophils Relative: 2 %
HCT: 32 % — ABNORMAL LOW (ref 39.0–52.0)
Hemoglobin: 11.1 g/dL — ABNORMAL LOW (ref 13.0–17.0)
Immature Granulocytes: 0 %
Lymphocytes Relative: 11 %
Lymphs Abs: 0.6 10*3/uL — ABNORMAL LOW (ref 0.7–4.0)
MCH: 31.8 pg (ref 26.0–34.0)
MCHC: 34.7 g/dL (ref 30.0–36.0)
MCV: 91.7 fL (ref 80.0–100.0)
Monocytes Absolute: 0.4 10*3/uL (ref 0.1–1.0)
Monocytes Relative: 7 %
Neutro Abs: 4.3 10*3/uL (ref 1.7–7.7)
Neutrophils Relative %: 80 %
Platelets: 64 10*3/uL — ABNORMAL LOW (ref 150–400)
RBC: 3.49 MIL/uL — ABNORMAL LOW (ref 4.22–5.81)
RDW: 14.3 % (ref 11.5–15.5)
WBC: 5.4 10*3/uL (ref 4.0–10.5)
nRBC: 0 % (ref 0.0–0.2)

## 2021-06-25 LAB — COMPREHENSIVE METABOLIC PANEL
ALT: 38 U/L (ref 0–44)
AST: 53 U/L — ABNORMAL HIGH (ref 15–41)
Albumin: 2.8 g/dL — ABNORMAL LOW (ref 3.5–5.0)
Alkaline Phosphatase: 192 U/L — ABNORMAL HIGH (ref 38–126)
Anion gap: 7 (ref 5–15)
BUN: 12 mg/dL (ref 8–23)
CO2: 27 mmol/L (ref 22–32)
Calcium: 8.4 mg/dL — ABNORMAL LOW (ref 8.9–10.3)
Chloride: 102 mmol/L (ref 98–111)
Creatinine, Ser: 0.87 mg/dL (ref 0.61–1.24)
GFR, Estimated: >60 mL/min (ref >60)
Glucose, Bld: 108 mg/dL — ABNORMAL HIGH (ref 70–99)
Potassium: 3.8 mmol/L (ref 3.5–5.1)
Sodium: 136 mmol/L (ref 135–145)
Total Bilirubin: 1.5 mg/dL — ABNORMAL HIGH (ref 0.3–1.2)
Total Protein: 5.6 g/dL — ABNORMAL LOW (ref 6.5–8.1)

## 2021-06-25 LAB — MRSA NEXT GEN BY PCR, NASAL: MRSA by PCR Next Gen: NOT DETECTED

## 2021-06-25 LAB — MAGNESIUM: Magnesium: 1.9 mg/dL (ref 1.7–2.4)

## 2021-06-25 LAB — URINE CULTURE: Culture: NO GROWTH

## 2021-06-25 LAB — PROCALCITONIN: Procalcitonin: 4.23 ng/mL

## 2021-06-25 LAB — GLUCOSE, CAPILLARY: Glucose-Capillary: 95 mg/dL (ref 70–99)

## 2021-06-25 SURGERY — ESOPHAGOGASTRODUODENOSCOPY (EGD) WITH PROPOFOL
Anesthesia: General

## 2021-06-25 MED ORDER — SODIUM CHLORIDE 0.9 % IV SOLN
2.0000 g | INTRAVENOUS | Status: DC
  Administered 2021-06-25 – 2021-06-26 (×2): 2 g via INTRAVENOUS
  Filled 2021-06-25 (×2): qty 20

## 2021-06-25 MED ORDER — PROPOFOL 10 MG/ML IV BOLUS
INTRAVENOUS | Status: DC | PRN
  Administered 2021-06-25 (×3): 50 mg via INTRAVENOUS

## 2021-06-25 NOTE — Anesthesia Postprocedure Evaluation (Signed)
Anesthesia Post Note ? ?Patient: Jon Bennett ? ?Procedure(s) Performed: ESOPHAGOGASTRODUODENOSCOPY (EGD) WITH PROPOFOL ? ?Patient location during evaluation: PACU ?Anesthesia Type: General ?Level of consciousness: awake and alert and oriented ?Pain management: pain level controlled ?Vital Signs Assessment: post-procedure vital signs reviewed and stable ?Respiratory status: spontaneous breathing, nonlabored ventilation and respiratory function stable ?Cardiovascular status: blood pressure returned to baseline and stable ?Postop Assessment: no apparent nausea or vomiting ?Anesthetic complications: no ? ? ?No notable events documented. ? ? ?Last Vitals:  ?Vitals:  ? 06/25/21 1338 06/25/21 1345  ?BP: (!) 90/59 105/64  ?Pulse: (!) 104 73  ?Resp: (!) 30 (!) 24  ?Temp:  36.4 ?C  ?SpO2: 98% 97%  ?  ?Last Pain:  ?Vitals:  ? 06/25/21 1333  ?TempSrc:   ?PainSc: Asleep  ? ? ?  ?  ?  ?  ?  ?  ? ?Jon Bennett ? ? ? ? ?

## 2021-06-25 NOTE — Anesthesia Procedure Notes (Signed)
Date/Time: 06/25/2021 1:09 PM ?Performed by: Vista Deck, CRNA ?Pre-anesthesia Checklist: Patient identified, Emergency Drugs available, Suction available, Timeout performed and Patient being monitored ?Patient Re-evaluated:Patient Re-evaluated prior to induction ?Oxygen Delivery Method: Nasal Cannula ? ? ? ? ?

## 2021-06-25 NOTE — Progress Notes (Signed)
Patient OTF for EGD at this time  ?

## 2021-06-25 NOTE — Anesthesia Preprocedure Evaluation (Signed)
Anesthesia Evaluation  ?Patient identified by MRN, date of birth, ID band ?Patient awake ? ? ? ?Reviewed: ?Allergy & Precautions, NPO status , Patient's Chart, lab work & pertinent test results, reviewed documented beta blocker date and time  ? ?Airway ?Mallampati: II ? ?TM Distance: >3 FB ?Neck ROM: Full ? ? ? Dental ? ?(+) Edentulous Upper, Edentulous Lower ?  ?Pulmonary ?neg pulmonary ROS,  ?  ?Pulmonary exam normal ?breath sounds clear to auscultation ? ? ? ? ? ? Cardiovascular ?Exercise Tolerance: Poor ?hypertension, Pt. on medications and Pt. on home beta blockers ? ?Rhythm:Irregular Rate:Normal ? ? ?  ?Neuro/Psych ?PSYCHIATRIC DISORDERS negative neurological ROS ?   ? GI/Hepatic ?GERD  Medicated,(+)  ?  ? substance abuse ? alcohol use,   ?Endo/Other  ?negative endocrine ROSdiabetes, Well Controlled, Type 2, Oral Hypoglycemic Agents ? Renal/GU ?Renal InsufficiencyRenal disease (AKI)  ?negative genitourinary ?  ?Musculoskeletal ?negative musculoskeletal ROS ?(+)  ? Abdominal ?  ?Peds ?negative pediatric ROS ?(+)  Hematology ? ?(+) Blood dyscrasia, anemia ,   ?Anesthesia Other Findings ? ? Reproductive/Obstetrics ?negative OB ROS ? ?  ? ? ? ? ? ? ? ? ? ? ? ? ? ?  ?  ? ? ? ? ? ? ? ?Anesthesia Physical ?Anesthesia Plan ? ?ASA: 3 and emergent ? ?Anesthesia Plan: General  ? ?Post-op Pain Management: Minimal or no pain anticipated  ? ?Induction: Intravenous ? ?PONV Risk Score and Plan: TIVA ? ?Airway Management Planned: Nasal Cannula and Natural Airway ? ?Additional Equipment:  ? ?Intra-op Plan:  ? ?Post-operative Plan: Possible Post-op intubation/ventilation ? ?Informed Consent: I have reviewed the patients History and Physical, chart, labs and discussed the procedure including the risks, benefits and alternatives for the proposed anesthesia with the patient or authorized representative who has indicated his/her understanding and acceptance.  ? ? ? ?Dental advisory given and Consent  reviewed with POA ? ?Plan Discussed with:  ? ?Anesthesia Plan Comments:   ? ? ? ? ? ? ?Anesthesia Quick Evaluation ? ?

## 2021-06-25 NOTE — Progress Notes (Signed)
?Subjective: ? ?Patient has no complaints.  He denies abdominal pain chest pain or shortness of breath.  He states he is hungry. ? ?Current Medications: ? ?Current Facility-Administered Medications:  ?  [MAR Hold] cefTRIAXone (ROCEPHIN) 2 g in sodium chloride 0.9 % 100 mL IVPB, 2 g, Intravenous, Q24H, Johnson, Clanford L, MD ?  [YYT Hold] Chlorhexidine Gluconate Cloth 2 % PADS 6 each, 6 each, Topical, Daily, Wynetta Emery, Clanford L, MD, 6 each at 06/24/21 2005 ?  [MAR Hold] diphenhydrAMINE (BENADRYL) injection 25 mg, 25 mg, Intravenous, Q6H PRN, Adefeso, Oladapo, DO, 25 mg at 06/24/21 1534 ?  [MAR Hold] folic acid (FOLVITE) tablet 1 mg, 1 mg, Oral, Daily, Johnson, Clanford L, MD ?  lactated ringers infusion, , Intravenous, Continuous, Johnson, Clanford L, MD, Last Rate: 125 mL/hr at 06/25/21 0301, New Bag at 06/25/21 0301 ?  [MAR Hold] LORazepam (ATIVAN) tablet 1-4 mg, 1-4 mg, Oral, Q1H PRN **OR** [MAR Hold] LORazepam (ATIVAN) injection 1-4 mg, 1-4 mg, Intravenous, Q1H PRN, Wynetta Emery, Clanford L, MD, 2 mg at 06/24/21 2030 ?  [MAR Hold] metoCLOPramide (REGLAN) injection 10 mg, 10 mg, Intravenous, Q6H PRN, Adefeso, Oladapo, DO, 10 mg at 06/24/21 1536 ?  [MAR Hold] multivitamin with minerals tablet 1 tablet, 1 tablet, Oral, Daily, Johnson, Clanford L, MD ?  Doug Sou Hold] pantoprazole (PROTONIX) injection 40 mg, 40 mg, Intravenous, Q12H, Jodi Mourning Kristen S, PA-C, 40 mg at 06/24/21 2131 ?  [MAR Hold] prochlorperazine (COMPAZINE) tablet 10 mg, 10 mg, Oral, Q6H PRN, Adefeso, Oladapo, DO ?  [MAR Hold] thiamine tablet 100 mg, 100 mg, Oral, Daily **OR** [MAR Hold] thiamine (B-1) injection 100 mg, 100 mg, Intravenous, Daily, Johnson, Clanford L, MD, 100 mg at 06/24/21 1538 ? ?Objective: ?Blood pressure (!) 131/98, pulse 82, temperature 97.8 ?F (36.6 ?C), temperature source Oral, resp. rate (!) 25, height _0  (1.753 m), weight 69.6 kg, SpO2 96 %. ?Patient is alert and in no acute distress.  He responds appropriately to  questions. ?Conjunctiva is pink. Sclera is nonicteric ?Oropharyngeal mucosa is normal. ?No neck masses or thyromegaly noted. ?Cardiac exam with regular rhythm normal S1 and S2. No murmur or gallop noted. ?Lungs are clear to auscultation. ?Abdomen is symmetrical soft and nontender with organomegaly or masses. ?No LE edema or clubbing noted. ? ?Labs/studies Results: ? ? ? ?  Latest Ref Rng & Units 06/25/2021  ?  6:53 AM 06/24/2021  ?  8:08 AM 06/24/2021  ?  2:59 AM  ?CBC  ?WBC 4.0 - 10.5 K/uL 5.4    8.3    ?Hemoglobin 13.0 - 17.0 g/dL 11.1   12.1   11.6    ?Hematocrit 39.0 - 52.0 % 32.0   35.9   34.6    ?Platelets 150 - 400 K/uL 64    89    ?  ? ?  Latest Ref Rng & Units 06/25/2021  ?  6:53 AM 06/24/2021  ?  2:59 AM 06/23/2021  ?  8:45 PM  ?CMP  ?Glucose 70 - 99 mg/dL 108   167   142    ?BUN 8 - 23 mg/dL _1 ?Creatinine 0.61 - 1.24 mg/dL 0.87   1.29   1.48    ?Sodium 135 - 145 mmol/L 136   137   137    ?Potassium 3.5 - 5.1 mmol/L 3.8   4.6   4.0    ?Chloride 98 - 111 mmol/L 102   106   107    ?  CO2 22 - 32 mmol/L _0 ?Calcium 8.9 - 10.3 mg/dL 8.4   9.6   10.4    ?Total Protein 6.5 - 8.1 g/dL 5.6   6.0   7.2    ?Total Bilirubin 0.3 - 1.2 mg/dL 1.5   3.2   4.0    ?Alkaline Phos 38 - 126 U/L 192   225   286    ?AST 15 - 41 U/L 53   139   158    ?ALT 0 - 44 U/L 38   56   55    ?  ? ?  Latest Ref Rng & Units 06/25/2021  ?  6:53 AM 06/24/2021  ?  2:59 AM 06/23/2021  ?  8:45 PM  ?Hepatic Function  ?Total Protein 6.5 - 8.1 g/dL 5.6   6.0   7.2    ?Albumin 3.5 - 5.0 g/dL 2.8   2.9   3.6    ?AST 15 - 41 U/L 53   139   158    ?ALT 0 - 44 U/L 38   56   55    ?Alk Phosphatase 38 - 126 U/L 192   225   286    ?Total Bilirubin 0.3 - 1.2 mg/dL 1.5   3.2   4.0    ?  ? ? ?Assessment: ? ?#1.  Coffee-ground emesis.  He has not vomited since hospitalization.  Hemoglobin has dropped by about 2 g since admission.  It remains to be seen whether he has peptic ulcer disease or Mallory-Weiss tear.  He does have large hiatal hernia  seen on CT.  He has remote history of esophageal perforation at EGD when he was living in [place].  He underwent surgery.  This was also confirmed by his son [name] with whom I have talked over the phone. ? ?#2.  Abnormal LFTs most likely secondary to choledocholithiasis.  Based on studies and clinical course it appears he has passed stones.  Transaminases and alkaline phosphatase continue to trend downwards. ? ?#3.  Anemia secondary to upper GI bleed. ? ?Plan: ? ?Proceed with diagnostic esophagogastroduodenoscopy. ?I have explained procedure to the patient and he is agreeable.  I also talked with his son Lesli Albee over the phone and discussed procedure with him.  He is also agreeable. ? ? ? ? ? ?

## 2021-06-25 NOTE — Progress Notes (Signed)
?PROGRESS NOTE ? ? ?Jon Bennett  [id number] DOB: May 14, 1934 DOA: 06/23/2021 ?PCP: Mertha Baars, MD  ? ?Chief Complaint  ?Patient presents with  ? Vomiting  ? ?Level of care: Telemetry ? ?Brief Admission History:  ?87 y.o. male with medical history significant of type 2 diabetes mellitus, hypertension, splenic vein thrombosis, alcohol withdrawal, GERD who presents to the emergency department due to several days of onset of nausea and vomiting and increased urinary frequency.  He denies any abdominal pain, chest pain, headache, blurry vision, shortness of breath.  Patient states that he has abstained from alcohol for several months. ?Patient was seen in the ED on [id number] due to alcoholic gastritis, he was treated in the ED and discharged home.  Apparently, patient had a pint of vodka about 4 days prior to the ED visit.  ?  ?ED Course:  ?In the emergency department, patient was febrile with a temperature of 100.61F, he was tachycardic, tachypneic work-up in the ED showed leukopenia (WBC 3.5), platelets 109, BUN/creatinine 16/1.48 (baseline creatinine at 0.6-1.0), lactic acid 3.3 > 4.5 > 3.5, phosphorus 2.4, magnesium 1.5, gastric occult blood was positive. ?CT abdomen and pelvis with contrast showed gallbladder distention with multiple stones, no inflammation. Slightly enlarged appearing common bile duct up to 8-9 mm which may be correlated with LFTs. ?Chest x-ray showed no acute abnormality.  Patient was treated with IV cefepime, Flagyl and vancomycin.  He was treated with IV Zofran, IV hydration was provided, magnesium was replenished. ? ?His blood cultures 4/11 positive for E coli. Pharmacy changed abx to ceftriaxone on 4/13.    ?  ?Assessment and Plan: ?* Intractable nausea and vomiting ?--continue supportive measures  ?-- no emesis since admission  ? ?Chronic anticoagulation ?--temporarily holding all anticoagulation for 72 hours per GI recommendations ?--monitor for further bleeding complications ? ?Acute  cholangitis vs Septic gallbladder from choledocholithiasis ?--working diagnosis ?--continue aggressive sepsis management ?--continue IV antibiotics ?--follow culture and sensitivities  ?--discussed with Dr. Laural Golden, will need a surgical consult for cholecystectomy ? ?Hypophosphatemia ?--IV replacement ordered 4/12.    ? ?AKI (acute kidney injury) ([place]) ?--RESOLVED after hydrating with IV fluids ? ? ?GI bleed ?--appreciate GI consultation,EGD 4/13 with findings of small mallory weiss tear ? ?Transaminitis ?-- LFTs trending down with treatments ? ?Hypercalcemia ?--resolved after hydration with IV fluid ? ?Lactic acidosis ?-- resolved with IV fluid ? ?Hypomagnesemia ?--IV replacement ordered, recheck Mg is 1.9  ? ?Sepsis ([place]) ?--continue sepsis protcol ?--lactate has normalized now ? ?GERD (gastroesophageal reflux disease) ?--protonix for GI protection  ? ?Thrombocytopenia ([place]) ?-- no active bleeding, platelets down to 64 today ?--recheck CBC in AM  ? ? ?DVT prophylaxis: SCDs ?Code Status: Full  ?Family Communication:  ?Disposition: Status is: Inpatient ?Remains inpatient appropriate because: IV Fluid, IV antibiotics required.  ?  ?Consultants:  ? ?Procedures:  ? ?Antimicrobials:  ?Cefepime 4/11> ?Vancomycin 4/11-4/12   ?Subjective: ?Pt was sedated from procedure today.  ?Objective: ?Vitals:  ? 06/25/21 1231 06/25/21 1333 06/25/21 1338 06/25/21 1345  ?BP: 136/84 (!) 80/58 (!) 90/59 105/64  ?Pulse: 78 87 (!) 104 73  ?Resp: (!) 24 (!) 29 (!) 30 (!) 24  ?Temp:  (!) 97.5 ?F (36.4 ?C)  97.6 ?F (36.4 ?C)  ?TempSrc:      ?SpO2: 97% 98% 98% 97%  ?Weight:      ?Height:      ? ? ?Intake/Output Summary (Last 24 hours) at 06/25/2021 1401 ?Last data filed at 06/25/2021 1326 ?Gross per 24 hour  ?Intake  660.28 ml  ?Output 500 ml  ?Net 160.28 ml  ? ?Filed Weights  ? 06/23/21 2007 06/24/21 1858 06/25/21 0500  ?Weight: 68 kg 69.7 kg 69.6 kg  ? ?Examination: ? ?General exam: Appears calm and comfortable, pt is confused with clear  dementia.  ?Respiratory system: Clear to auscultation. Respiratory effort normal. ?Cardiovascular system: normal S1 & S2 heard. No JVD, murmurs, rubs, gallops or clicks. No pedal edema. ?Gastrointestinal system: Abdomen is nondistended, soft and nontender. No organomegaly or masses felt. Normal bowel sounds heard. ?Central nervous system: Alert and oriented to person. No focal neurological deficits. ?Extremities: Symmetric 5 x 5 power. ?Skin: No rashes, lesions or ulcers. ?Psychiatry: Judgement and insight appear normal. Mood & affect appropriate.  ? ?Data Reviewed: I have personally reviewed following labs and imaging studies ? ?CBC: ?Recent Labs  ?Lab 06/23/21 ?2045 06/24/21 ?0259 06/24/21 ?8937 06/25/21 ?3428  ?WBC 3.5* 8.3  --  5.4  ?NEUTROABS 3.2  --   --  4.3  ?HGB 13.2 11.6* 12.1* 11.1*  ?HCT 37.9* 34.6* 35.9* 32.0*  ?MCV 91.1 94.0  --  91.7  ?PLT 109* 89*  --  64*  ? ? ?Basic Metabolic Panel: ?Recent Labs  ?Lab 06/23/21 ?2045 06/24/21 ?0259 06/25/21 ?7681  ?NA 137 137 136  ?K 4.0 4.6 3.8  ?CL 107 106 102  ?CO2 22 20* 27  ?GLUCOSE 142* 167* 108*  ?BUN 16 13 12   ?CREATININE 1.48* 1.29* 0.87  ?CALCIUM 10.4* 9.6 8.4*  ?MG  --  1.5* 1.9  ?PHOS  --  2.4*  --   ? ? ?CBG: ?Recent Labs  ?Lab 06/24/21 ?1815 06/25/21 ?1158  ?GLUCAP 112* 95  ? ? ?Recent Results (from the past 240 hour(s))  ?Blood Culture (routine x 2)     Status: None (Preliminary result)  ? Collection Time: 06/23/21  8:45 PM  ? Specimen: BLOOD RIGHT FOREARM  ?Result Value Ref Range Status  ? Specimen Description   Final  ?  BLOOD RIGHT FOREARM ?Performed at [facility], [address],  [place], [place] [address] ?  ? Special Requests   Final  ?  BOTTLES DRAWN AEROBIC AND ANAEROBIC Blood Culture adequate volume ?Performed at [facility], [address] [place], [place] [address] ?  ? Culture  Setup Time   Final  ?  GRAM NEGATIVE RODS IN BOTH AEROBIC AND ANAEROBIC BOTTLES ?Gram Stain Report Called to,Read Back By and Verified With: GOFF A. AT 1016A ON  [id number] BY THOMPSON S. ?Performed at [facility], [address] [place], [place] [address] ?  ? Culture   Final  ?  GRAM NEGATIVE RODS ?IDENTIFICATION TO FOLLOW ?Performed at [facility], [address], [place], [place] [address] ?  ? Report Status PENDING  Incomplete  ?Resp Panel by RT-PCR (Flu A&B, Covid) Nasopharyngeal Swab     Status: None  ? Collection Time: 06/23/21  8:47 PM  ? Specimen: Nasopharyngeal Swab; Nasopharyngeal(NP) swabs in vial transport medium  ?Result Value Ref Range Status  ? SARS Coronavirus 2 by RT PCR NEGATIVE NEGATIVE Final  ?  Comment: (NOTE) ?SARS-CoV-2 target nucleic acids are NOT DETECTED. ? ?The SARS-CoV-2 RNA is generally detectable in upper respiratory ?specimens during the acute phase of infection. The lowest ?concentration of SARS-CoV-2 viral copies this assay can detect is ?138 copies/mL. A negative result does not preclude SARS-Cov-2 ?infection and should not be used as the sole basis for treatment or ?other patient management decisions. A negative result may occur with  ?  improper specimen collection/handling, submission of specimen other ?than nasopharyngeal swab, presence of viral mutation(s) within the ?areas targeted by this assay, and inadequate number of viral ?copies(<138 copies/mL). A negative result must be combined with ?clinical observations, patient history, and epidemiological ?information. The expected result is Negative. ? ?Fact Sheet for Patients:  ?EntrepreneurPulse.com.au ? ?Fact Sheet for Healthcare Providers:  ?IncredibleEmployment.be ? ?This test is no t yet approved or cleared by the [place] FDA and  ?has been authorized for detection and/or diagnosis of SARS-CoV-2 by ?FDA under an Emergency Use Authorization (EUA). This EUA will remain  ?in effect (meaning this test can be used) for the duration of the ?COVID-19 declaration under Section 564(b)(1) of the Act, 21 ?U.S.C.section 360bbb-3(b)(1), unless the  authorization is terminated  ?or revoked sooner.  ? ? ?  ? Influenza A by PCR NEGATIVE NEGATIVE Final  ? Influenza B by PCR NEGATIVE NEGATIVE Final  ?  Comment: (NOTE) ?The Xpert Xpress SARS-CoV-2/FLU/RSV plus assay is

## 2021-06-25 NOTE — Assessment & Plan Note (Signed)
--   resolved with IV fluid ?

## 2021-06-25 NOTE — Progress Notes (Signed)
Brief EGD note. ? ?Grade B esophagitis involving distal 2 cm of esophageal mucosa ?Short Mallory-Weiss tear with stigmata of bleed but no active bleeding. ?10 cm size sliding hiatal hernia. ?Edema and erythema to herniated mucosa. ?Bulbar duodenitis. 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 ?

## 2021-06-25 NOTE — Op Note (Signed)
[facility] ?Patient Name: Jon Bennett ?Procedure Date: 06/25/2021 12:18 PM ?MRN: [id number] ?Date of Birth: 04/29/1934 ?Attending MD: Hildred Laser , MD ?CSN: [id number] ?Age: 87 ?Admit Type: Inpatient ?Procedure:                Upper GI endoscopy ?Indications:              Coffee-ground emesis ?Providers:                Hildred Laser, MD, Rosina Lowenstein, RN, Crisann  ?                          Wynonia Lawman, Technician ?Referring MD:              ?Medicines:                Propofol per Anesthesia ?Complications:            No immediate complications. ?Estimated Blood Loss:     Estimated blood loss: none. ?Procedure:                Pre-Anesthesia Assessment: ?                          - Prior to the procedure, a History and Physical  ?                          was performed, and patient medications and  ?                          allergies were reviewed. The patient's tolerance of  ?                          previous anesthesia was also reviewed. The risks  ?                          and benefits of the procedure and the sedation  ?                          options and risks were discussed with the patient.  ?                          All questions were answered, and informed consent  ?                          was obtained. Prior Anticoagulants: The patient  ?                          last took Eliquis (apixaban) 2 days prior to the  ?                          procedure. ASA Grade Assessment: III - A patient  ?                          with severe systemic disease. After reviewing the  ?  risks and benefits, the patient was deemed in  ?                          satisfactory condition to undergo the procedure. ?                          After obtaining informed consent, the endoscope was  ?                          passed under direct vision. Throughout the  ?                          procedure, the patient's blood pressure, pulse, and  ?                          oxygen saturations were  monitored continuously. The  ?                          [id number] scope was introduced through the  ?                          mouth, and advanced to the second part of duodenum.  ?                          The upper GI endoscopy was accomplished without  ?                          difficulty. The patient tolerated the procedure  ?                          well. ?Scope In: 1:21:29 PM ?Scope Out: 1:27:58 PM ?Total Procedure Duration: 0 hours 6 minutes 29 seconds  ?Findings: ?     The hypopharynx was dry otherwise normal. ?     The proximal esophagus and mid esophagus were normal. ?     LA Grade B (one or more mucosal breaks greater than 5 mm, not extending  ?     between the tops of two mucosal folds) esophagitis with no bleeding was  ?     found 28 to 30 cm from the incisors. ?     The Z-line was regular and was found 30 cm from the incisors. ?     A 10 cm hiatal hernia was present. ?     A small non-bleeding Mallory-Weiss tear with stigmata of recent bleeding  ?     was found. ?     The exam of the stomach was otherwise normal. ?     Patchy mild inflammation characterized by congestion (edema) and  ?     erythema was found in the duodenal bulb. ?     A large non-bleeding diverticulum was found in the second portion of the  ?     duodenum. ?Impression:               - Normal hypopharynx. ?                          - Normal proximal esophagus and mid esophagus. ?                          -  LA Grade B reflux esophagitis with no bleeding. ?                          - Z-line regular, 30 cm from the incisors. ?                          - 10 cm hiatal hernia. ?                          - Mallory-Weiss tear. Mallory-Weiss tear located  ?                          just below the GE junction along the lesser  ?                          curvature. ?                          - Duodenitis. ?                          - Non-bleeding duodenal diverticulum. ?                          - No specimens collected. ?Moderate  Sedation: ?     Per Anesthesia Care ?Recommendation:           - Return patient to ICU for ongoing care. ?                          - Full liquid diet today. ?                          - Continue present medications. ?                          - Hold anticoagulant for 72 hours. ?                          - H. pylori serology. ?                          - Keep head end of bed elevated by 25 degrees at  ?                          all times. ?Procedure Code(s):        --- Professional --- ?                          [phone], Esophagogastroduodenoscopy, flexible,  ?                          transoral; diagnostic, including collection of  ?                          specimen(s) by brushing or washing, when performed  ?                          (  separate procedure) ?Diagnosis Code(s):        --- Professional --- ?                          K21.00, Gastro-esophageal reflux disease with  ?                          esophagitis, without bleeding ?                          K44.9, Diaphragmatic hernia without obstruction or  ?                          gangrene ?                          K22.6, Gastro-esophageal laceration-hemorrhage  ?                          syndrome ?                          K29.80, Duodenitis without bleeding ?                          K92.0, Hematemesis ?                          K57.10, Diverticulosis of small intestine without  ?                          perforation or abscess without bleeding ?CPT copyright 2019 American Medical Association. All rights reserved. ?The codes documented in this report are preliminary and upon coder review may  ?be revised to meet current compliance requirements. ?Hildred Laser, MD ?Hildred Laser, MD ?06/25/2021 1:39:51 PM ?This report has been signed electronically. ?Number of Addenda: 0 ?

## 2021-06-25 NOTE — Care Management Important Message (Signed)
Important Message ? ?Patient Details  ?Name: Jon Bennett ?MRN: [id number] ?Date of Birth: 05/20/1934 ? ? ?Medicare Important Message Given:  N/A - LOS <3 / Initial given by admissions ? ? ? ? ?Jon Bennett ?06/25/2021, 6:30 PM ?

## 2021-06-25 NOTE — Transfer of Care (Addendum)
Immediate Anesthesia Transfer of Care Note ? ?Patient: Jon Bennett ? ?Procedure(s) Performed: ESOPHAGOGASTRODUODENOSCOPY (EGD) WITH PROPOFOL ? ?Patient Location: PACU ? ?Anesthesia Type:General ? ?Level of Consciousness: sedated and patient cooperative ? ?Airway & Oxygen Therapy: Patient Spontanous Breathing ? ?Post-op Assessment: Report given to RN and Post -op Vital signs reviewed and stable ? ?Post vital signs: Reviewed and stable ? ?Last Vitals:  ?Vitals Value Taken Time  ?BP 136/84   ?Temp 36.4   ?Pulse 92   ?Resp 29   ?SpO2 98   ?SEE Pacu FLOW SHEET FOR VITAL SIGNS ? ?Last Pain:  ?Vitals:  ? 06/25/21 1310  ?TempSrc:   ?PainSc: 0-No pain  ?   ? ?  ? ?Complications: No notable events documented. ?

## 2021-06-26 ENCOUNTER — Encounter (HOSPITAL_COMMUNITY): Payer: Self-pay | Admitting: Internal Medicine

## 2021-06-26 DIAGNOSIS — K8001 Calculus of gallbladder with acute cholecystitis with obstruction: Secondary | ICD-10-CM | POA: Diagnosis present

## 2021-06-26 DIAGNOSIS — R112 Nausea with vomiting, unspecified: Secondary | ICD-10-CM | POA: Diagnosis not present

## 2021-06-26 DIAGNOSIS — Z7901 Long term (current) use of anticoagulants: Secondary | ICD-10-CM | POA: Diagnosis not present

## 2021-06-26 DIAGNOSIS — K219 Gastro-esophageal reflux disease without esophagitis: Secondary | ICD-10-CM | POA: Diagnosis not present

## 2021-06-26 DIAGNOSIS — K8309 Other cholangitis: Secondary | ICD-10-CM | POA: Diagnosis not present

## 2021-06-26 DIAGNOSIS — N179 Acute kidney failure, unspecified: Secondary | ICD-10-CM | POA: Diagnosis not present

## 2021-06-26 LAB — CBC WITH DIFFERENTIAL/PLATELET
Abs Immature Granulocytes: 0.02 10*3/uL (ref 0.00–0.07)
Basophils Absolute: 0 10*3/uL (ref 0.0–0.1)
Basophils Relative: 0 %
Eosinophils Absolute: 0.1 10*3/uL (ref 0.0–0.5)
Eosinophils Relative: 2 %
HCT: 30.4 % — ABNORMAL LOW (ref 39.0–52.0)
Hemoglobin: 10 g/dL — ABNORMAL LOW (ref 13.0–17.0)
Immature Granulocytes: 0 %
Lymphocytes Relative: 17 %
Lymphs Abs: 0.8 10*3/uL (ref 0.7–4.0)
MCH: 30.3 pg (ref 26.0–34.0)
MCHC: 32.9 g/dL (ref 30.0–36.0)
MCV: 92.1 fL (ref 80.0–100.0)
Monocytes Absolute: 0.4 10*3/uL (ref 0.1–1.0)
Monocytes Relative: 8 %
Neutro Abs: 3.3 10*3/uL (ref 1.7–7.7)
Neutrophils Relative %: 73 %
Platelets: 67 10*3/uL — ABNORMAL LOW (ref 150–400)
RBC: 3.3 MIL/uL — ABNORMAL LOW (ref 4.22–5.81)
RDW: 14 % (ref 11.5–15.5)
WBC: 4.6 10*3/uL (ref 4.0–10.5)
nRBC: 0 % (ref 0.0–0.2)

## 2021-06-26 LAB — COMPREHENSIVE METABOLIC PANEL
ALT: 26 U/L (ref 0–44)
AST: 29 U/L (ref 15–41)
Albumin: 2.6 g/dL — ABNORMAL LOW (ref 3.5–5.0)
Alkaline Phosphatase: 167 U/L — ABNORMAL HIGH (ref 38–126)
Anion gap: 7 (ref 5–15)
BUN: 11 mg/dL (ref 8–23)
CO2: 27 mmol/L (ref 22–32)
Calcium: 8.3 mg/dL — ABNORMAL LOW (ref 8.9–10.3)
Chloride: 101 mmol/L (ref 98–111)
Creatinine, Ser: 0.79 mg/dL (ref 0.61–1.24)
GFR, Estimated: >60 mL/min (ref >60)
Glucose, Bld: 112 mg/dL — ABNORMAL HIGH (ref 70–99)
Potassium: 3.9 mmol/L (ref 3.5–5.1)
Sodium: 135 mmol/L (ref 135–145)
Total Bilirubin: 0.7 mg/dL (ref 0.3–1.2)
Total Protein: 5.3 g/dL — ABNORMAL LOW (ref 6.5–8.1)

## 2021-06-26 LAB — CULTURE, BLOOD (ROUTINE X 2)
Special Requests: ADEQUATE
Special Requests: ADEQUATE

## 2021-06-26 LAB — PROCALCITONIN: Procalcitonin: 1.81 ng/mL

## 2021-06-26 LAB — MAGNESIUM: Magnesium: 1.9 mg/dL (ref 1.7–2.4)

## 2021-06-26 MED ORDER — CEFAZOLIN SODIUM-DEXTROSE 2-4 GM/100ML-% IV SOLN
2.0000 g | Freq: Three times a day (TID) | INTRAVENOUS | Status: DC
  Administered 2021-06-27 (×2): 2 g via INTRAVENOUS
  Filled 2021-06-26 (×2): qty 100

## 2021-06-26 MED ORDER — ACETAMINOPHEN 325 MG PO TABS
650.0000 mg | ORAL_TABLET | Freq: Four times a day (QID) | ORAL | Status: DC | PRN
  Administered 2021-06-26 – 2021-07-07 (×8): 650 mg via ORAL
  Filled 2021-06-26 (×8): qty 2

## 2021-06-26 NOTE — Progress Notes (Signed)
Around lunch time today pt started to show signs of withdrawal, developed tremors, more confusion and extremely anxious when his son's came in to visit. I did a CIWA assessment and at that time he scored 14. I administered 1mg  Ativan which seemed to help for a little while, he started back up around 1745 CIWA score at 14 again. 1mg  administered, MD aware.  ?

## 2021-06-26 NOTE — Progress Notes (Signed)
? ?Gastroenterology Progress Note  ? ?Referring Provider: No ref. provider found ?Primary Care Physician:  Mertha Baars, MD ?Primary Gastroenterologist:  Dr. Laural Golden ? ?Patient ID: ISIDOR BROMELL; [id number]; [birth year]  ? ? ?Subjective  ? ?Patient reported some looser stools this morning however feels like it is more normal. Denies N/V, abdominal pain. Did not want to have lunch just yet, lack of appetite before admission. Appetite improving. ? ? ?Objective  ? ?Vital signs in last 24 hours ?Temp:  [97.5 ?F (36.4 ?C)-98.9 ?F (37.2 ?C)] 98.9 ?F (37.2 ?C) (04/14 0534) ?Pulse Rate:  [52-104] 84 (04/14 0534) ?Resp:  [18-34] 18 (04/14 0534) ?BP: (80-151)/(52-95) 145/89 (04/14 0534) ?SpO2:  [95 %-100 %] 96 % (04/14 0534) ?Last BM Date : 06/25/21 ? ?Physical Exam ?General:   Alert and oriented, pleasant ?Head:  Normocephalic and atraumatic. ?Eyes:  No icterus, sclera clear. Conjuctiva pink.  ?Mouth:  Without lesions, mucosa pink and moist.  ?Neck:  Supple, without thyromegaly or masses.  ?Heart:  S1, S2 present, no murmurs noted.  ?Lungs: Clear to auscultation bilaterally, without wheezing, rales, or rhonchi.  ?Abdomen:  Bowel sounds present, soft, non-tender, non-distended. No HSM or hernias noted. No rebound or guarding. No masses appreciated  ?Msk:  Symmetrical without gross deformities. Normal posture. ?Pulses:  Normal pulses noted. ?Extremities:  Without clubbing or edema. ?Neurologic:  Alert and  oriented x4;  grossly normal neurologically. ?Skin:  Warm and dry, intact without significant lesions.  ?Cervical Nodes:  No significant cervical adenopathy. ?Psych:  Alert and cooperative. Normal mood and affect. ? ?Intake/Output from previous day: ?04/13 0701 - 04/14 0700 ?In: 995.5 [P.O.:480; I.V.:415.5; IV Piggyback:100] ?Out: 2100 [Urine:2100] ?Intake/Output this shift: ?Total I/O ?In: 360 [P.O.:360] ?Out: 1 [Stool:1] ? ?Lab Results ? ?Recent Labs  ?  06/24/21 ?0259 06/24/21 ?0808 06/25/21 ?0653 06/26/21 ?0255  ?WBC 8.3   --  5.4 4.6  ?HGB 11.6* 12.1* 11.1* 10.0*  ?HCT 34.6* 35.9* 32.0* 30.4*  ?PLT 89*  --  64* 67*  ? ?BMET ?Recent Labs  ?  06/24/21 ?0259 06/25/21 ?0653 06/26/21 ?0255  ?NA 137 136 135  ?K 4.6 3.8 3.9  ?CL 106 102 101  ?CO2 20* 27 27  ?GLUCOSE 167* 108* 112*  ?BUN $Rem'13 12 11  'rRIS$ ?CREATININE 1.29* 0.87 0.79  ?CALCIUM 9.6 8.4* 8.3*  ? ?LFT ?Recent Labs  ?  06/24/21 ?0259 06/25/21 ?0653 06/26/21 ?0255  ?PROT 6.0* 5.6* 5.3*  ?ALBUMIN 2.9* 2.8* 2.6*  ?AST 139* 53* 29  ?ALT 56* 38 26  ?ALKPHOS 225* 192* 167*  ?BILITOT 3.2* 1.5* 0.7  ? ?PT/INR ?Recent Labs  ?  06/23/21 ?2045  ?LABPROT 14.0  ?INR 1.1  ? ?Hepatitis Panel ?No results for input(s): HEPBSAG, HCVAB, HEPAIGM, HEPBIGM in the last 72 hours. ? ? ?Studies/Results ?CT ABDOMEN PELVIS W CONTRAST ? ?Result Date: 06/24/2021 ?CLINICAL DATA:  Abdomen pain nausea vomiting EXAM: CT ABDOMEN AND PELVIS WITH CONTRAST TECHNIQUE: Multidetector CT imaging of the abdomen and pelvis was performed using the standard protocol following bolus administration of intravenous contrast. RADIATION DOSE REDUCTION: This exam was performed according to the departmental dose-optimization program which includes automated exposure control, adjustment of the mA and/or kV according to patient size and/or use of iterative reconstruction technique. CONTRAST:  36mL OMNIPAQUE IOHEXOL 300 MG/ML  SOLN COMPARISON:  CT 04/21/2020 FINDINGS: Lower chest: Lung bases demonstrate scarring or atelectasis in the left lung base. Moderate to large hiatal hernia. Normal cardiac size. Hepatobiliary: No focal hepatic abnormality. Distended gallbladder with multiple stones.  No inflammatory changes. Slightly enlarged common bile duct up to 8-9 mm. No calcified stones along the course of the duct Pancreas: Atrophic.  No inflammatory changes. Spleen: Normal in size without focal abnormality. Adrenals/Urinary Tract: Adrenal glands are normal. Kidneys show no hydronephrosis. Cyst lower pole right kidney, no follow-up imaging  recommended. The bladder is unremarkable Stomach/Bowel: The stomach is nonenlarged. No dilated small bowel. No acute bowel wall thickening. The appendix is nonvisualized corresponding to history of appendectomy. Vascular/Lymphatic: Moderate aortic atherosclerosis. No aneurysm. No suspicious lymph nodes Reproductive: Post treatment changes in the region of the prostate gland. Other: Negative for pelvic effusion or free air. Musculoskeletal: Fatty atrophy of the left rectus sheath. No acute osseous abnormality IMPRESSION: 1. Gallbladder distension with multiple stones but no inflammation. Slightly enlarged appearing common bile duct up to 8-9 mm which may be correlated with LFTs. 2. Otherwise no CT evidence for acute intra-abdominal or pelvic abnormality. Electronically Signed   By: Donavan Foil M.D.   On: 06/24/2021 00:32  ? ?US Abdomen Limited ? ?Result Date: 06/24/2021 ?CLINICAL DATA:  87 year old male with nausea and vomiting. EXAM: ULTRASOUND ABDOMEN LIMITED RIGHT UPPER QUADRANT COMPARISON:  CT abdomen pelvis from earlier the same day FINDINGS: Gallbladder: The gallbladder is distended with layering sludge and gallstones. No gallbladder wall thickening or pericholecystic fluid. No sonographic Murphy sign. Common bile duct: Diameter: 6 mm Liver: No focal lesion identified. No evidence of intrahepatic biliary ductal dilation. Within normal limits in parenchymal echogenicity and echotexture. Portal vein is patent on color Doppler imaging with normal direction of blood flow towards the liver. Other: No perihepatic ascites. IMPRESSION: Distended gallbladder with cholelithiasis and sludge; no sonographic evidence of acute cholecystitis. Ruthann Cancer, MD [facility] Electronically Signed   By: Ruthann Cancer M.D.   On: 06/24/2021 09:00  ? ?DG Chest Port 1 View ? ?Result Date: 06/23/2021 ?CLINICAL DATA:  Possible sepsis EXAM: PORTABLE CHEST 1 VIEW COMPARISON:   08/24/2020 FINDINGS: Cardiac shadow is stable. Postsurgical changes are noted. No focal infiltrate or effusion is seen. No bony abnormality is noted. IMPRESSION: No acute abnormality seen. Electronically Signed   By: Inez Catalina M.D.   On: 06/23/2021 22:23   ? ?Assessment  ?87 y.o. male with a history of stroke, type 2 diabetes, HTN, splenic vein thrombosis, alcohol use disorder, GERD who presented to the ED on 4/11 with nausea/vomiting and increased urinary frequency.  He was previously seen in the ED on 3/23 after having half a pint of vodka 4 days prior subsequently had developed vomiting.  He was found to have elevated LFTs and elevated bilirubin, lipase normal.  No imaging was pursued, and he was diagnosed with acute alcoholic gastritis and discharged on Protonix and Zofran.  He had returned with worsening nausea and vomiting, and new coffee-ground emesis, fever, is now admitted with sepsis and concern for cholangitis in setting of transaminitis, hyperbilirubinemia, cholelithiasis, dilated CBD.  He was also found to have heme positive stool with normal hemoglobin initially and AKI.  GI consulted for further evaluation of transaminitis, possible cholangitis, and upper GI bleed. ? ? ?Nausea/vomiting: Presented with 3 weeks of abdominal pain, possibly secondary to sepsis with presumed cholangitis.  CT A/P with contrast revealed cholelithiasis without cholecystitis, enlarged CBD up to 8 to 9 mm without obvious calcified stone.  RUQ ultrasound this admission with distended gallbladder with cholelithiasis without cholecystitis, CBD measuring 6 mm.  Acutely elevated LFTs 3 weeks ago with bilirubin of 6.1.  On  admission AST 158, ALT 55, alk phos 286, T. bili down to 4.1.  After receiving IV fluids LFTs slightly improved with AST 139, ALT 56, alk phos 225, T. bili 3.2.  Patient continues to deny any abdominal pain with negative abdominal exam.  He continues to be alert and oriented x4.  He met sepsis criteria on  admission with tachycardia, tachypnea, leukopenia, fever and he is currently on IV cefepime and vancomycin.  He received a dose of Zofran, Reglan, and Compazine ordered every 6 hours as needed to help with nausea.  He

## 2021-06-26 NOTE — Progress Notes (Signed)
?PROGRESS NOTE ? ? ?Jon Bennett  IFO:[id number] DOB: 02/08/1935 DOA: 06/23/2021 ?PCP: [name], MD  ? ?Chief Complaint  ?Patient presents with  ? Vomiting  ? ?Level of care: Telemetry ? ?Brief Admission History:  ?87 y.o. male with medical history significant of type 2 diabetes mellitus, hypertension, splenic vein thrombosis, alcohol withdrawal, GERD who presents to the emergency department due to several days of onset of nausea and vomiting and increased urinary frequency.  He denies any abdominal pain, chest pain, headache, blurry vision, shortness of breath.  Patient states that he has abstained from alcohol for several months. ?Patient was seen in the ED on 3/23 due to alcoholic gastritis, he was treated in the ED and discharged home.  Apparently, patient had a pint of vodka about 4 days prior to the ED visit.  ?  ?ED Course:  ?In the emergency department, patient was febrile with a temperature of 100.68F, he was tachycardic, tachypneic work-up in the ED showed leukopenia (WBC 3.5), platelets 109, BUN/creatinine 16/1.48 (baseline creatinine at 0.6-1.0), lactic acid 3.3 > 4.5 > 3.5, phosphorus 2.4, magnesium 1.5, gastric occult blood was positive.  CT abdomen and pelvis with contrast showed gallbladder distention with multiple stones, no inflammation. Slightly enlarged appearing common bile duct up to 8-9 mm which may be correlated with LFTs.  Chest x-ray showed no acute abnormality.  Patient was treated with IV cefepime, Flagyl and vancomycin.  He was treated with IV Zofran, IV hydration was provided, magnesium was replenished. ? ?His blood cultures 4/11 positive for E coli. Pharmacy changed abx to ceftriaxone on 4/13.    ? ?06/26/2021 : surgery planning for cholecystectomy in next couple of days. Narrowed antibiotics for E colic bacteremia to cefazolin starting on 4/14.   ?  ?Assessment and Plan: ?* Intractable nausea and vomiting ?--continue supportive measures  ?-- no emesis since admission  ? ?Chronic  anticoagulation ?--temporarily holding all anticoagulation for 72 hours per GI recommendations ?--monitor for further bleeding complications ? ?Acute cholangitis vs Septic gallbladder from choledocholithiasis ?--working diagnosis ?--continue aggressive sepsis management ?--continue IV antibiotics ?--follow culture and sensitivities  ?--discussed with Dr. Karilyn Cota, will need a surgical consult for cholecystectomy ? ?Hypophosphatemia ?--IV replacement ordered 4/12.    ? ?AKI (acute kidney injury) (HCC) ?--RESOLVED after hydrating with IV fluids ? ? ?GI bleed ?--appreciate GI consultation,EGD 4/13 with findings of small mallory weiss tear ? ?Transaminitis ?-- LFTs trending down with treatments ?--planning for lap chole with Dr. Lovell Sheehan in a couple of days  ? ?Hypercalcemia ?--resolved after hydration with IV fluid ? ?Lactic acidosis ?-- resolved with IV fluid ? ?Hypomagnesemia ?--IV replacement ordered, recheck Mg is 1.9  ? ?Sepsis from E coli bacteremia  ?--continue sepsis protcol ?--lactate has normalized now ?--sepsis physiology has corrected and resolved now ? ?GERD (gastroesophageal reflux disease) ?--protonix for GI protection  ? ?Thrombocytopenia (HCC) ?-- no active bleeding, platelets down to 64 today ?-- pt likely will need a platelet transfusion prior to surgery   ? ? ?DVT prophylaxis: SCDs ?Code Status: Full  ?Family Communication:  ?Disposition: Status is: Inpatient ?Remains inpatient appropriate because: IV Fluid, IV antibiotics required.  ?  ?Consultants:  ? ?Procedures:  ? ?Antimicrobials:  ?Cefepime 4/11> ?Vancomycin 4/11-4/12   ?Subjective: ?Pt  wanting to have his diet advanced.  He denies abdominal pain.   ?Objective: ?Vitals:  ? 06/25/21 1839 06/25/21 2101 06/26/21 0534 06/26/21 1313  ?BP: 111/68 137/86 (!) 145/89 (!) 129/94  ?Pulse: (!) 101 88 84 91  ?Resp: 18  18 18 18   ?Temp: 98.3 ?F (36.8 ?C) 98.2 ?F (36.8 ?C) 98.9 ?F (37.2 ?C) 98.7 ?F (37.1 ?C)  ?TempSrc: Oral Oral Oral Oral  ?SpO2: 98% 100% 96%  97%  ?Weight:      ?Height:      ? ? ?Intake/Output Summary (Last 24 hours) at 06/26/2021 1527 ?Last data filed at 06/26/2021 1100 ?Gross per 24 hour  ?Intake 960 ml  ?Output 2101 ml  ?Net -1141 ml  ? ?Filed Weights  ? 06/23/21 2007 06/24/21 1858 06/25/21 0500  ?Weight: 68 kg 69.7 kg 69.6 kg  ? ?Examination: ? ?General exam: Appears calm and comfortable, pt is confused with clear dementia.  ?Respiratory system: Clear to auscultation. Respiratory effort normal. ?Cardiovascular system: normal S1 & S2 heard. No JVD, murmurs, rubs, gallops or clicks. No pedal edema. ?Gastrointestinal system: Abdomen is nondistended, soft and mild RUQ tenderness. No organomegaly or masses felt. Normal bowel sounds heard. ?Central nervous system: Alert and oriented to person. No focal neurological deficits. ?Extremities: Symmetric 5 x 5 power. ?Skin: No rashes, lesions or ulcers. ?Psychiatry: Judgement and insight appear normal. Mood & affect appropriate.  ? ?Data Reviewed: I have personally reviewed following labs and imaging studies ? ?CBC: ?Recent Labs  ?Lab 06/23/21 ?2045 06/24/21 ?0259 06/24/21 ?[id number] 06/25/21 ?[id number] 06/26/21 ?0255  ?WBC 3.5* 8.3  --  5.4 4.6  ?NEUTROABS 3.2  --   --  4.3 3.3  ?HGB 13.2 11.6* 12.1* 11.1* 10.0*  ?HCT 37.9* 34.6* 35.9* 32.0* 30.4*  ?MCV 91.1 94.0  --  91.7 92.1  ?PLT 109* 89*  --  64* 67*  ? ? ?Basic Metabolic Panel: ?Recent Labs  ?Lab 06/23/21 ?2045 06/24/21 ?0259 06/25/21 ?[id number] 06/26/21 ?0255  ?NA 137 137 136 135  ?K 4.0 4.6 3.8 3.9  ?CL 107 106 102 101  ?CO2 22 20* 27 27  ?GLUCOSE 142* 167* 108* 112*  ?BUN 16 13 12 11   ?CREATININE 1.48* 1.29* 0.87 0.79  ?CALCIUM 10.4* 9.6 8.4* 8.3*  ?MG  --  1.5* 1.9 1.9  ?PHOS  --  2.4*  --   --   ? ? ?CBG: ?Recent Labs  ?Lab 06/24/21 ?1815 06/25/21 ?1158  ?GLUCAP 112* 95  ? ? ?Recent Results (from the past 240 hour(s))  ?Blood Culture (routine x 2)     Status: Abnormal  ? Collection Time: 06/23/21  8:45 PM  ? Specimen: BLOOD RIGHT FOREARM  ?Result Value Ref Range  Status  ? Specimen Description   Final  ?  BLOOD RIGHT FOREARM ?Performed at [facility], [address] [place], [place] [address] ?  ? Special Requests   Final  ?  BOTTLES DRAWN AEROBIC AND ANAEROBIC Blood Culture adequate volume ?Performed at [facility], [address] [place], [place] [address] ?  ? Culture  Setup Time   Final  ?  GRAM NEGATIVE RODS IN BOTH AEROBIC AND ANAEROBIC BOTTLES ?Gram Stain Report Called to,Read Back By and Verified With: GOFF A. AT 1016A ON [id number] BY THOMPSON S. ?Performed at [facility], [address] [place], [place] [address] ?  ? Culture (A)  Final  ?  ESCHERICHIA COLI ?SUSCEPTIBILITIES PERFORMED ON PREVIOUS CULTURE WITHIN THE LAST 5 DAYS. ?Performed at [facility], [address] [place], [place] [address] ?  ? Report Status 06/26/2021 FINAL  Final  ?Resp Panel by RT-PCR (Flu A&B, Covid) Nasopharyngeal Swab     Status: None  ? Collection Time: 06/23/21  8:47 PM  ? Specimen: Nasopharyngeal Swab;  Nasopharyngeal(NP) swabs in vial transport medium  ?Result Value Ref Range Status  ? SARS Coronavirus 2 by RT PCR NEGATIVE NEGATIVE Final  ?  Comment: (NOTE) ?SARS-CoV-2 target nucleic acids are NOT DETECTED. ? ?The SARS-CoV-2 RNA is generally detectable in upper respiratory ?specimens during the acute phase of infection. The lowest ?concentration of SARS-CoV-2 viral copies this assay can detect is ?138 copies/mL. A negative result does not preclude SARS-Cov-2 ?infection and should not be used as the sole basis for treatment or ?other patient management decisions. A negative result may occur with  ?improper specimen collection/handling, submission of specimen other ?than nasopharyngeal swab, presence of viral mutation(s) within the ?areas targeted by this assay, and inadequate number of viral ?copies(<138 copies/mL). A negative result must be combined with ?clinical observations, patient history, and epidemiological ?information. The expected result is Negative. ? ?Fact Sheet  for Patients:  ?BloggerCourse.com ? ?Fact Sheet for Healthcare Providers:  ?SeriousBroker.it ? ?This test is no t yet approved or cleared by the [place] [facility]

## 2021-06-26 NOTE — TOC Initial Note (Signed)
Transition of Care (TOC) - Initial/Assessment Note  ? ? ?Patient Details  ?Name: Jon Bennett ?MRN: [id number] ?Date of Birth: Apr 16, 1934 ? ?Transition of Care (TOC) CM/SW Contact:    ?Elliot Gault, LCSW ?Phone Number: ?06/26/2021, 3:14 PM ? ?Clinical Narrative:                 ? ?Pt admitted from home. He now has a high readmission risk score and TOC received a consult for SA treatment resources. Spoke with pt's son today to assess and review dc planning. Pt lives with his son, [name], and plan is for return to home at dc. Pt has a walker but has not had to use it. [name] states that pt is independent in ADLs at home. Son assists pt in getting to appointments and obtaining medications as needed. Per [name], pt has not had HH in the past but he did stay at [facility] for rehab one time. ? ?Per [name], pt has not been over using ETOH and he does not feel there is a need for SA treatment resources. He states that if there is a recommendation for SNF or HH at dc, they will consider at that time. ? ?Originally there was a possibility for lap chole on Monday but [name] states that MD indicated now that they may hold off and have pt follow up in outpatient clinic in three weeks.  ? ?TOC will follow and further assess and assist with dc planning. ? ?Expected Discharge Plan: Home/Self Care ?Barriers to Discharge: Continued Medical Work up ? ? ?Patient Goals and CMS Choice ?Patient states their goals for this hospitalization and ongoing recovery are:: go home ?CMS Medicare.gov Compare Post Acute Care list provided to:: Patient Represenative (must comment) ?Choice offered to / list presented to : Adult Children ? ?Expected Discharge Plan and Services ?Expected Discharge Plan: Home/Self Care ?In-house Referral: Clinical Social Work ?  ?  ?Living arrangements for the past 2 months: Single Family Home ?                ?  ?  ?  ?  ?  ?  ?  ?  ?  ?  ? ?Prior Living Arrangements/Services ?Living arrangements for the past 2 months: Single  Family Home ?Lives with:: Adult Children ?Patient language and need for interpreter reviewed:: Yes ?Do you feel safe going back to the place where you live?: Yes      ?Need for Family Participation in Patient Care: No (Comment) ?Care giver support system in place?: Yes (comment) ?Current home services: DME ?Criminal Activity/Legal Involvement Pertinent to Current Situation/Hospitalization: No - Comment as needed ? ?Activities of Daily Living ?Home Assistive Devices/Equipment: Eyeglasses, Dentures (specify type) (top dentures in bag) ?ADL Screening (condition at time of admission) ?Patient's cognitive ability adequate to safely complete daily activities?: No ?Is the patient deaf or have difficulty hearing?: No ?Does the patient have difficulty seeing, even when wearing glasses/contacts?: Yes ?Does the patient have difficulty concentrating, remembering, or making decisions?: Yes ?Patient able to express need for assistance with ADLs?: Yes ?Does the patient have difficulty dressing or bathing?: Yes ?Independently performs ADLs?: No ?Communication: Needs assistance (pt reportd he lives with his son) ?Dressing (OT): Independent ?Does the patient have difficulty walking or climbing stairs?: Yes ?Weakness of Legs: Both ?Weakness of Arms/Hands: Both ? ?Permission Sought/Granted ?  ?  ?   ?   ?   ?   ? ?Emotional Assessment ?  ?  ?  ?Orientation: :  Oriented to Self, Oriented to Place, Oriented to Situation, Oriented to  Time ?Alcohol / Substance Use: Not Applicable ?Psych Involvement: No (comment) ? ?Admission diagnosis:  Acute cholangitis [K83.09] ?Intractable nausea and vomiting [R11.2] ?Sepsis, due to unspecified organism, unspecified whether acute organ dysfunction present (HCC) [A41.9] ?Patient Active Problem List  ? Diagnosis Date Noted  ? Calculus of gallbladder with acute cholecystitis and obstruction   ? Intractable nausea and vomiting 06/24/2021  ? Sepsis (HCC) 06/24/2021  ? Hypomagnesemia 06/24/2021  ? Lactic  acidosis 06/24/2021  ? Hypercalcemia 06/24/2021  ? Transaminitis 06/24/2021  ? GI bleed 06/24/2021  ? AKI (acute kidney injury) (HCC) 06/24/2021  ? Hypophosphatemia 06/24/2021  ? Acute cholangitis vs Septic gallbladder from choledocholithiasis 06/24/2021  ? Chronic anticoagulation 06/24/2021  ? Hyponatremia 08/24/2020  ? 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 Dyskinesia   ? Absolute anemia   ? Akathisia 06/29/2019  ? Nausea and vomiting 06/29/2019  ? Acathisia 06/29/2019  ? Acute hyperactive alcohol withdrawal delirium (HCC) 04/24/2019  ? Type 2 diabetes mellitus (HCC)   ? Hypertension   ? GERD (gastroesophageal reflux disease)   ? Thrombocytopenia (HCC) 08/16/2018  ? Alcohol withdrawal (HCC) 08/13/2018  ? ?PCP:  [name], MD ?Pharmacy:   ?[id number] - [place], [place] - [address] ?[address] ?[place] [address] ?Phone: [phone] Fax: [phone] ? ? ? ? ?Social Determinants of Health (SDOH) Interventions ?  ? ?Readmission Risk Interventions ? ?  06/26/2021  ?  3:09 PM 08/27/2020  ? 11:33 AM  ?Readmission Risk Prevention Plan  ?Transportation Screening Complete Complete  ?PCP or Specialist Appt within 5-7 Days  Complete  ?Home Care Screening  Complete  ?Medication Review (RN CM)  Complete  ?HRI or Home Care Consult Complete   ?Palliative Care Screening Not Applicable   ?Medication Review Oceanographer) Complete   ? ? ? ?

## 2021-06-26 NOTE — Consult Note (Signed)
Reason for Consult: Cholelithiasis, transaminitis ?Referring Physician: Dr. Laural Benes ? ?Jon Bennett is an 87 y.o. male.  ?HPI: Patient is an 87 year old white male with multiple medical problems including diabetes mellitus, history of splenic vein thrombosis, and history of alcoholic pancreatitis who presented to the emergency room with nausea and vomiting that have been occurring for approximately 3 weeks.  He was found to have transaminitis with an elevated bilirubin.  He had also had episodes of coffee-ground emesis.  He was admitted to the hospital for further evaluation and treatment.  He underwent an EGD by Dr. Karilyn Cota yesterday which showed a Mallory-Weiss tear and duodenitis.  Today, he does feel better and has no abdominal pain.  He denies any fever, chills, or fatty food intolerance. ? ?Past Medical History:  ?Diagnosis Date  ? Diabetes mellitus without complication (HCC)   ? Patient is not on any medication for this  ? GERD (gastroesophageal reflux disease)   ? Hypertension   ? Pancreatitis, alcoholic, acute   ? Splenic vein thrombosis   ? ? ?Past Surgical History:  ?Procedure Laterality Date  ? APPENDECTOMY    ? ESOPHAGOGASTRODUODENOSCOPY (EGD) WITH PROPOFOL N/A 06/25/2021  ? Procedure: ESOPHAGOGASTRODUODENOSCOPY (EGD) WITH PROPOFOL;  Surgeon: Malissa Hippo, MD;  Location: [facility];  Service: Endoscopy;  Laterality: N/A;  ? VASCULAR SURGERY    ? ? ?Family History  ?Problem Relation Age of Onset  ? Hypertension Other   ? Heart disease Father   ? Healthy Sister   ? Healthy Brother   ? Cancer Maternal Grandfather   ? Healthy Brother   ? Healthy Brother   ? Healthy Sister   ? Healthy Sister   ? Healthy Sister   ? Healthy Brother   ? Healthy Son   ? Healthy Son   ? Healthy Son   ? Healthy Son   ? ? ?Social History:  reports that he has never smoked. He has never used smokeless tobacco. He reports that he does not currently use alcohol after a past usage of about 1.0 standard drink per week. He reports  that he does not use drugs. ? ?Allergies:  ?Allergies  ?Allergen Reactions  ? Asa [Aspirin] Shortness Of Breath  ? ? ?Medications: I have reviewed the patient's current medications. ? ?Results for orders placed or performed during the hospital encounter of 06/23/21 (from the past 48 hour(s))  ?Lactic acid, plasma     Status: None  ? Collection Time: 06/24/21 11:14 AM  ?Result Value Ref Range  ? Lactic Acid, Venous 1.6 0.5 - 1.9 mmol/L  ?  Comment: Performed at [facility], [address] [place], [place] [address]  ?Glucose, capillary     Status: Abnormal  ? Collection Time: 06/24/21  6:15 PM  ?Result Value Ref Range  ? Glucose-Capillary 112 (H) 70 - 99 mg/dL  ?  Comment: Glucose reference range applies only to samples taken after fasting for at least 8 hours.  ?MRSA Next Gen by PCR, Nasal     Status: None  ? Collection Time: 06/25/21  6:20 AM  ? Specimen: Nasal Mucosa; Nasal Swab  ?Result Value Ref Range  ? MRSA by PCR Next Gen NOT DETECTED NOT DETECTED  ?  Comment: (NOTE) ?The GeneXpert MRSA Assay (FDA approved for NASAL specimens only), ?is one component of a comprehensive MRSA colonization surveillance ?program. It is not intended to diagnose MRSA infection nor to guide ?or monitor treatment for MRSA infections. ?Test performance is not FDA approved in patients  less than 2 years ?old. ?Performed at [facility], [address] [place], [place] [address] ?  ?CBC with Differential/Platelet     Status: Abnormal  ? Collection Time: 06/25/21  6:53 AM  ?Result Value Ref Range  ? WBC 5.4 4.0 - 10.5 K/uL  ? RBC 3.49 (L) 4.22 - 5.81 MIL/uL  ? Hemoglobin 11.1 (L) 13.0 - 17.0 g/dL  ? HCT 32.0 (L) 39.0 - 52.0 %  ? MCV 91.7 80.0 - 100.0 fL  ? MCH 31.8 26.0 - 34.0 pg  ? MCHC 34.7 30.0 - 36.0 g/dL  ? RDW 14.3 11.5 - 15.5 %  ? Platelets 64 (L) 150 - 400 K/uL  ?  Comment: Immature Platelet Fraction may be ?clinically indicated, consider ?ordering this additional test ?[id number] ?CONSISTENT WITH PREVIOUS RESULT ?  ? nRBC 0.0 0.0  - 0.2 %  ? Neutrophils Relative % 80 %  ? Neutro Abs 4.3 1.7 - 7.7 K/uL  ? Lymphocytes Relative 11 %  ? Lymphs Abs 0.6 (L) 0.7 - 4.0 K/uL  ? Monocytes Relative 7 %  ? Monocytes Absolute 0.4 0.1 - 1.0 K/uL  ? Eosinophils Relative 2 %  ? Eosinophils Absolute 0.1 0.0 - 0.5 K/uL  ? Basophils Relative 0 %  ? Basophils Absolute 0.0 0.0 - 0.1 K/uL  ? Immature Granulocytes 0 %  ? Abs Immature Granulocytes 0.01 0.00 - 0.07 K/uL  ?  Comment: Performed at [facility], [address] [place], [place] [address]  ?Comprehensive metabolic panel     Status: Abnormal  ? Collection Time: 06/25/21  6:53 AM  ?Result Value Ref Range  ? Sodium 136 135 - 145 mmol/L  ? Potassium 3.8 3.5 - 5.1 mmol/L  ?  Comment: DELTA CHECK NOTED  ? Chloride 102 98 - 111 mmol/L  ? CO2 27 22 - 32 mmol/L  ? Glucose, Bld 108 (H) 70 - 99 mg/dL  ?  Comment: Glucose reference range applies only to samples taken after fasting for at least 8 hours.  ? BUN 12 8 - 23 mg/dL  ? Creatinine, Ser 0.87 0.61 - 1.24 mg/dL  ? Calcium 8.4 (L) 8.9 - 10.3 mg/dL  ? Total Protein 5.6 (L) 6.5 - 8.1 g/dL  ? Albumin 2.8 (L) 3.5 - 5.0 g/dL  ? AST 53 (H) 15 - 41 U/L  ? ALT 38 0 - 44 U/L  ? Alkaline Phosphatase 192 (H) 38 - 126 U/L  ? Total Bilirubin 1.5 (H) 0.3 - 1.2 mg/dL  ? GFR, Estimated >60 >60 mL/min  ?  Comment: (NOTE) ?Calculated using the CKD-EPI Creatinine Equation (2021) ?  ? Anion gap 7 5 - 15  ?  Comment: Performed at [facility], [address] [place], [place] [address]  ?Magnesium     Status: None  ? Collection Time: 06/25/21  6:53 AM  ?Result Value Ref Range  ? Magnesium 1.9 1.7 - 2.4 mg/dL  ?  Comment: Performed at [facility], [address] [place], [place] [address]  ?Procalcitonin     Status: None  ? Collection Time: 06/25/21  6:53 AM  ?Result Value Ref Range  ? Procalcitonin 4.23 ng/mL  ?  Comment:        ?Interpretation: ?PCT > 2 ng/mL: ?Systemic infection (sepsis) is likely, ?unless other causes are known. ?(NOTE) ?      Sepsis PCT Algorithm           Lower  Respiratory Tract ?  Infection PCT Algorithm ?   ----------------------------     ---------------------------- ?        PCT < 0.25 ng/mL                PCT < 0.10 ng/mL ? ?        Strongly encourage             Strongly discourage ?  discontinuation of antibiotics    initiation of antibiotics ?   ----------------------------     ----------------------------- ?      PCT 0.25 - 0.50 ng/mL            PCT 0.10 - 0.25 ng/mL ?              OR ?      >80% decrease in PCT            Discourage initiation of ?                                           antibiotics ?     Encourage discontinuation ?          of antibiotics ?   ----------------------------     ----------------------------- ?        PCT >= 0.50 ng/mL              PCT 0.26 - 0.50 ng/mL ?              AND ?      <80% decrease in PCT              Encourage initiation of ?                                            antibiotics ?      Encourage continuation ?          of antibiotics ?   ----------------------------     ----------------------------- ?       PCT >= 0.50 ng/mL                  PCT > 0.50 ng/mL ?              AND ?        increase in PCT                  Strongly encourage ?                                     initiation of antibiotics ?   Strongly encourage escalation ?          of antibiotics ?                                    ----------------------------- ?                                          PCT <= 0.25 ng/mL ?  OR ?                                       > 80% decrease in PCT ? ?                                    Discontinue / Do not initiate ?                                            antibiotics ? ?Performed at [facility], [address] [place], [place] [address] ?  ?Glucose, capillary     Status: None  ? Collection Time: 06/25/21 11:58 AM  ?Result Value Ref Range  ? Glucose-Capillary 95 70 - 99 mg/dL  ?  Comment: Glucose reference range applies only to  samples taken after fasting for at least 8 hours.  ?CBC with Differential/Platelet     Status: Abnormal  ? Collection Time: 06/26/21  2:55 AM  ?Result Value Ref Range  ? WBC 4.6 4.0 - 10.5 K/uL  ? RBC 3.

## 2021-06-27 ENCOUNTER — Inpatient Hospital Stay (HOSPITAL_COMMUNITY): Payer: Medicare Other

## 2021-06-27 DIAGNOSIS — K8001 Calculus of gallbladder with acute cholecystitis with obstruction: Secondary | ICD-10-CM | POA: Diagnosis not present

## 2021-06-27 DIAGNOSIS — Z7901 Long term (current) use of anticoagulants: Secondary | ICD-10-CM | POA: Diagnosis not present

## 2021-06-27 DIAGNOSIS — R112 Nausea with vomiting, unspecified: Secondary | ICD-10-CM | POA: Diagnosis not present

## 2021-06-27 DIAGNOSIS — K8309 Other cholangitis: Secondary | ICD-10-CM | POA: Diagnosis not present

## 2021-06-27 DIAGNOSIS — N179 Acute kidney failure, unspecified: Secondary | ICD-10-CM | POA: Diagnosis not present

## 2021-06-27 LAB — LACTIC ACID, PLASMA
Lactic Acid, Venous: 1.5 mmol/L (ref 0.5–1.9)
Lactic Acid, Venous: 0.8 mmol/L (ref 0.5–1.9)

## 2021-06-27 LAB — CBC WITH DIFFERENTIAL/PLATELET
Abs Immature Granulocytes: 0.04 10*3/uL (ref 0.00–0.07)
Basophils Absolute: 0 10*3/uL (ref 0.0–0.1)
Basophils Relative: 0 %
Eosinophils Absolute: 0 10*3/uL (ref 0.0–0.5)
Eosinophils Relative: 0 %
HCT: 33 % — ABNORMAL LOW (ref 39.0–52.0)
Hemoglobin: 11.4 g/dL — ABNORMAL LOW (ref 13.0–17.0)
Immature Granulocytes: 1 %
Lymphocytes Relative: 4 %
Lymphs Abs: 0.3 10*3/uL — ABNORMAL LOW (ref 0.7–4.0)
MCH: 30.8 pg (ref 26.0–34.0)
MCHC: 34.5 g/dL (ref 30.0–36.0)
MCV: 89.2 fL (ref 80.0–100.0)
Monocytes Absolute: 0.6 10*3/uL (ref 0.1–1.0)
Monocytes Relative: 8 %
Neutro Abs: 6.7 10*3/uL (ref 1.7–7.7)
Neutrophils Relative %: 87 %
Platelets: 83 10*3/uL — ABNORMAL LOW (ref 150–400)
RBC: 3.7 MIL/uL — ABNORMAL LOW (ref 4.22–5.81)
RDW: 13.7 % (ref 11.5–15.5)
WBC: 7.7 10*3/uL (ref 4.0–10.5)
nRBC: 0 % (ref 0.0–0.2)

## 2021-06-27 LAB — AMMONIA: Ammonia: 107 umol/L — ABNORMAL HIGH (ref 9–35)

## 2021-06-27 LAB — COMPREHENSIVE METABOLIC PANEL
ALT: 56 U/L — ABNORMAL HIGH (ref 0–44)
AST: 132 U/L — ABNORMAL HIGH (ref 15–41)
Albumin: 2.9 g/dL — ABNORMAL LOW (ref 3.5–5.0)
Alkaline Phosphatase: 343 U/L — ABNORMAL HIGH (ref 38–126)
Anion gap: 10 (ref 5–15)
BUN: 8 mg/dL (ref 8–23)
CO2: 23 mmol/L (ref 22–32)
Calcium: 8.4 mg/dL — ABNORMAL LOW (ref 8.9–10.3)
Chloride: 98 mmol/L (ref 98–111)
Creatinine, Ser: 0.62 mg/dL (ref 0.61–1.24)
GFR, Estimated: >60 mL/min (ref >60)
Glucose, Bld: 143 mg/dL — ABNORMAL HIGH (ref 70–99)
Potassium: 3.6 mmol/L (ref 3.5–5.1)
Sodium: 131 mmol/L — ABNORMAL LOW (ref 135–145)
Total Bilirubin: 4.1 mg/dL — ABNORMAL HIGH (ref 0.3–1.2)
Total Protein: 6.3 g/dL — ABNORMAL LOW (ref 6.5–8.1)

## 2021-06-27 LAB — MAGNESIUM: Magnesium: 1.9 mg/dL (ref 1.7–2.4)

## 2021-06-27 LAB — PROCALCITONIN: Procalcitonin: 1.16 ng/mL

## 2021-06-27 MED ORDER — SODIUM CHLORIDE 0.9 % IV SOLN: INTRAVENOUS | Status: DC

## 2021-06-27 MED ORDER — SODIUM CHLORIDE 0.9 % IV SOLN
2.0000 g | INTRAVENOUS | Status: DC
  Administered 2021-06-27: 2 g via INTRAVENOUS
  Filled 2021-06-27: qty 20

## 2021-06-27 MED ORDER — SODIUM CHLORIDE 0.9 % IV BOLUS
500.0000 mL | Freq: Once | INTRAVENOUS | Status: AC
  Administered 2021-06-27: 500 mL via INTRAVENOUS

## 2021-06-27 MED ORDER — CHLORHEXIDINE GLUCONATE CLOTH 2 % EX PADS
6.0000 | MEDICATED_PAD | Freq: Every day | CUTANEOUS | Status: DC
  Administered 2021-06-27 – 2021-07-08 (×12): 6 via TOPICAL

## 2021-06-27 NOTE — Progress Notes (Signed)
?PROGRESS NOTE ? ? ?Jon Bennett  KKX:[id number] DOB: May 18, 1934 DOA: 06/23/2021 ?PCP: [name], MD  ? ?Chief Complaint  ?Patient presents with  ? Vomiting  ? ?Level of care: Med-Surg ? ?Brief Admission History:  ?87 y.o. male with medical history significant of type 2 diabetes mellitus, hypertension, splenic vein thrombosis, alcohol withdrawal, GERD who presents to the emergency department due to several days of onset of nausea and vomiting and increased urinary frequency.  He denies any abdominal pain, chest pain, headache, blurry vision, shortness of breath.  Patient states that he has abstained from alcohol for several months. Patient was seen in the ED on 3/23 due to alcoholic gastritis, he was treated in the ED and discharged home.  Apparently, patient had a pint of vodka about 4 days prior to the ED visit.  ?  ?ED Course:  ?In the emergency department, patient was febrile with a temperature of 100.44F, he was tachycardic, tachypneic work-up in the ED showed leukopenia (WBC 3.5), platelets 109, BUN/creatinine 16/1.48 (baseline creatinine at 0.6-1.0), lactic acid 3.3 > 4.5 > 3.5, phosphorus 2.4, magnesium 1.5, gastric occult blood was positive.  CT abdomen and pelvis with contrast showed gallbladder distention with multiple stones, no inflammation. Slightly enlarged appearing common bile duct up to 8-9 mm which may be correlated with LFTs.  Chest x-ray showed no acute abnormality.  Patient was treated with IV cefepime, Flagyl and vancomycin.  He was treated with IV Zofran, IV hydration was provided, magnesium was replenished. ? ?His blood cultures 4/11 positive for E coli. Pharmacy changed abx to ceftriaxone on 4/13.    ? ?06/26/2021 : surgery planning for cholecystectomy in next couple of days. Narrowed antibiotics for E colic bacteremia to cefazolin starting on 4/14.   ? ?06/27/2021: spike in bilirubin to 4.1 today.  GI planning MRCP on 4/17.  Timing of cholecystectomy to be determined after further  testing.  ?  ?Assessment and Plan: ?* Intractable nausea and vomiting ?--continue supportive measures  ?-- no emesis since admission  ? ?Hyperbilirubinemia ?--spike in bilirubin today to 4.1.  GI recalled to case and planning MRCP on Monday 4/17 (MRI not available at [facility] on weekends) ? ?Calculus of gallbladder with acute cholecystitis and obstruction ?--planning MRCP on 4/17 and subsequent chole pending results of other studies and testing.  ? ?Chronic anticoagulation ?--temporarily holding all anticoagulation for 72 hours per GI recommendations but also in anticipation of upcoming gallbladder surgery.  ?--monitor for further bleeding complications ? ?Acute cholangitis vs Septic gallbladder from choledocholithiasis ?--responded favorably to aggressive sepsis management ?--continue IV antibiotics ?--follow culture and sensitivities  ?--discussed with Dr. Karilyn Cota, will need a surgical consult for cholecystectomy, MRCP planned for 4/17 ? ?Hypophosphatemia ?--IV replacement ordered 4/12 and repleted.    ? ?AKI (acute kidney injury) (HCC) ?--RESOLVED after hydrating with IV fluids ? ? ?GI bleed ?--appreciate GI consultation,EGD 4/13 with findings of small mallory weiss tear ?--continue protonix as ordered for GI protection ? ?Transaminitis ?-- LFTs initially trended down with treatments but now rising with bilirubin, GI recalled and planning MRCP on 4/17.   ?--planning for lap chole with Dr. Lovell Sheehan - timing to be determined  ? ?Hypercalcemia ?--resolved after hydration with IV fluid ? ?Lactic acidosis ?-- resolved with IV fluid ? ?Hypomagnesemia ?--IV replacement ordered, recheck Mg is 1.9  ? ?Sepsis from E coli bacteremia  ?--pansensitive E coli now being treated with IV cefazolin  ?--lactate has normalized now ?--sepsis physiology has corrected and resolved now ? ?GERD (gastroesophageal reflux  disease) ?--protonix for GI protection  ? ?Thrombocytopenia (HCC) ?-- no active bleeding, platelets slightly improved to 83  today ?-- pt likely will need a platelet transfusion prior to surgery   ? ? ?DVT prophylaxis: SCDs ?Code Status: Full  ?Family Communication:  ?Disposition: Status is: Inpatient ?Remains inpatient appropriate because: IV Fluid, IV antibiotics required.  ?  ?Consultants:  ? ?Procedures:  ? ?Antimicrobials:  ?Cefepime 4/11> ?Vancomycin 4/11-4/12   ?Subjective: ?Pt was medicated with lorazepam, as he had some agitation/delirium, possibly some alcohol withdrawal   ?Objective: ?Vitals:  ? 06/26/21 2031 06/26/21 2242 06/27/21 0129 06/27/21 0532  ?BP: (!) 162/89 (!) 164/94 (!) 189/99 (!) 159/86  ?Pulse: 92 89 98 99  ?Resp: 20   20  ?Temp: 98.1 ?F (36.7 ?C)   98.3 ?F (36.8 ?C)  ?TempSrc:      ?SpO2: 98%   98%  ?Weight:      ?Height:      ? ? ?Intake/Output Summary (Last 24 hours) at 06/27/2021 1102 ?Last data filed at 06/27/2021 0900 ?Gross per 24 hour  ?Intake 600 ml  ?Output 2200 ml  ?Net -1600 ml  ? ?Filed Weights  ? 06/23/21 2007 06/24/21 1858 06/25/21 0500  ?Weight: 68 kg 69.7 kg 69.6 kg  ? ?Examination: ? ?General exam: Appears calm and comfortable, pt is confused with clear dementia.  ?Respiratory system: Clear to auscultation. Respiratory effort normal. ?Cardiovascular system: normal S1 & S2 heard. No JVD, murmurs, rubs, gallops or clicks. No pedal edema. ?Gastrointestinal system: Abdomen is nondistended, soft and mild RUQ tenderness. No organomegaly or masses felt. Normal bowel sounds heard. ?Central nervous system: Alert and oriented to person. No focal neurological deficits. ?Extremities: Symmetric 5 x 5 power. ?Skin: No rashes, lesions or ulcers. ?Psychiatry: Judgement and insight appear normal. Mood & affect appropriate.  ? ?Data Reviewed: I have personally reviewed following labs and imaging studies ? ?CBC: ?Recent Labs  ?Lab 06/23/21 ?2045 06/24/21 ?0259 06/24/21 ?[id number] 06/25/21 ?[id number] 06/26/21 ?0255 06/27/21 ?0422  ?WBC 3.5* 8.3  --  5.4 4.6 7.7  ?NEUTROABS 3.2  --   --  4.3 3.3 6.7  ?HGB 13.2 11.6* 12.1* 11.1*  10.0* 11.4*  ?HCT 37.9* 34.6* 35.9* 32.0* 30.4* 33.0*  ?MCV 91.1 94.0  --  91.7 92.1 89.2  ?PLT 109* 89*  --  64* 67* 83*  ? ? ?Basic Metabolic Panel: ?Recent Labs  ?Lab 06/23/21 ?2045 06/24/21 ?0259 06/25/21 ?[id number] 06/26/21 ?0255 06/27/21 ?0422  ?NA 137 137 136 135 131*  ?K 4.0 4.6 3.8 3.9 3.6  ?CL 107 106 102 101 98  ?CO2 22 20* 27 27 23   ?GLUCOSE 142* 167* 108* 112* 143*  ?BUN 16 13 12 11 8   ?CREATININE 1.48* 1.29* 0.87 0.79 0.62  ?CALCIUM 10.4* 9.6 8.4* 8.3* 8.4*  ?MG  --  1.5* 1.9 1.9 1.9  ?PHOS  --  2.4*  --   --   --   ? ? ?CBG: ?Recent Labs  ?Lab 06/24/21 ?1815 06/25/21 ?1158  ?GLUCAP 112* 95  ? ? ?Recent Results (from the past 240 hour(s))  ?Blood Culture (routine x 2)     Status: Abnormal  ? Collection Time: 06/23/21  8:45 PM  ? Specimen: BLOOD RIGHT FOREARM  ?Result Value Ref Range Status  ? Specimen Description   Final  ?  BLOOD RIGHT FOREARM ?Performed at [facility], [address] [place], [place] [address] ?  ? Special Requests   Final  ?  BOTTLES DRAWN AEROBIC AND ANAEROBIC Blood Culture  adequate volume ?Performed at [facility], [address] [place], [place] [address] ?  ? Culture  Setup Time   Final  ?  GRAM NEGATIVE RODS IN BOTH AEROBIC AND ANAEROBIC BOTTLES ?Gram Stain Report Called to,Read Back By and Verified With: GOFF A. AT 1016A ON [id number] BY THOMPSON S. ?Performed at [facility], [address], [place], [place] [address] ?  ? Culture (A)  Final  ?  ESCHERICHIA COLI ?SUSCEPTIBILITIES PERFORMED ON PREVIOUS CULTURE WITHIN THE LAST 5 DAYS. ?Performed at [facility], [address], [place], [place] [address] ?  ? Report Status 06/26/2021 FINAL  Final  ?Resp Panel by RT-PCR (Flu A&B, Covid) Nasopharyngeal Swab     Status: None  ? Collection Time: 06/23/21  8:47 PM  ? Specimen: Nasopharyngeal Swab; Nasopharyngeal(NP) swabs in vial transport medium  ?Result Value Ref Range Status  ? SARS Coronavirus 2 by RT PCR NEGATIVE NEGATIVE Final  ?  Comment: (NOTE) ?SARS-CoV-2 target nucleic acids  are NOT DETECTED. ? ?The SARS-CoV-2 RNA is generally detectable in upper respiratory ?specimens during the acute phase of infection. The lowest ?concentration of SARS-CoV-2 viral copies this assay can detect is

## 2021-06-27 NOTE — Assessment & Plan Note (Addendum)
--  Dr. Arnoldo Morale recommending ERCP or IR percutaneous drainage of biliary tree.   ?

## 2021-06-27 NOTE — Progress Notes (Signed)
Aide reported patient looked funny. Nurse entered room vitals taken see flowsheet. respirations 37 counted for full min and HR sustaining 120's MD called and made aware. Temp 101 orally. MD came to bedside. Tylenol given tolerated PO in applesauce. Orders for ammonia level and blood cultures to be obtained.IV Rocephin given. 500 bolus ordered then back to NS at 100 ml/hr. Waiting for bed availability in ICU. Will continue to monitor patient. Red mews 6 documentation completed.  ?

## 2021-06-27 NOTE — Progress Notes (Signed)
06/27/2021 ?7:11 PM ? ?I was able to reach patient's son Jon Bennett and discussed change in status, worsening sepsis and transfer to stepdown ICU.  He said that his father had expressed to him on multiple occasions that he would not want life support measures and would want DNR status.  Son lives nearby and able to come and see him and would appreciate updates with status.  He will inform other family members of change in status.  Maryln Manuel, MD  ?

## 2021-06-27 NOTE — Progress Notes (Signed)
06/27/2021 ?6:58 PM ? ?Came to see patient due to change in status, increased lethargy, fever 102, tachycardic up to 125.  Ordered fluid bolus, repeat blood cultures x 2, lactic acid, ammonia level.  Will transfer to stepdown ICU.  Will reach out to family to get more clarification about code status.   Changing antibiotics to ceftriaxone 2 gm IV.  ? ?C. Laural Benes, MD  ?

## 2021-06-27 NOTE — Progress Notes (Signed)
2 Days Post-Op  ?Subjective: ?Patient appears more somnolent today, though he is oriented to place and person.  He did receive Ativan.  He denies any abdominal pain. ? ?Objective: ?Vital signs in last 24 hours: ?Temp:  [98.1 ?F (36.7 ?C)-98.7 ?F (37.1 ?C)] 98.3 ?F (36.8 ?C) (04/15 0532) ?Pulse Rate:  [89-108] 99 (04/15 0532) ?Resp:  [18-20] 20 (04/15 0532) ?BP: (129-189)/(86-99) 159/86 (04/15 0532) ?SpO2:  [97 %-98 %] 98 % (04/15 0532) ?Last BM Date : 06/26/21 ? ?Intake/Output from previous day: ?04/14 0701 - 04/15 0700 ?In: 1080 [P.O.:1080] ?Out: 1701 [Urine:1700; Stool:1] ?Intake/Output this shift: ?Total I/O ?In: -  ?Out: 500 [Urine:500] ? ?General appearance: no distress ?GI: soft, non-tender; bowel sounds normal; no masses,  no organomegaly ? ?Lab Results:  ?Recent Labs  ?  06/26/21 ?0255 06/27/21 ?0422  ?WBC 4.6 7.7  ?HGB 10.0* 11.4*  ?HCT 30.4* 33.0*  ?PLT 67* 83*  ? ?BMET ?Recent Labs  ?  06/26/21 ?0255 06/27/21 ?0422  ?NA 135 131*  ?K 3.9 3.6  ?CL 101 98  ?CO2 27 23  ?GLUCOSE 112* 143*  ?BUN 11 8  ?CREATININE 0.79 0.62  ?CALCIUM 8.3* 8.4*  ? ?PT/INR ?No results for input(s): LABPROT, INR in the last 72 hours. ? ?Studies/Results: ?No results found. ? ?Anti-infectives: ?Anti-infectives (From admission, onward)  ? ? Start     Dose/Rate Route Frequency Ordered Stop  ? 06/27/21 0800  ceFAZolin (ANCEF) IVPB 2g/100 mL premix       ? 2 g ?200 mL/hr over 30 Minutes Intravenous Every 8 hours 06/26/21 1520    ? 06/25/21 1000  vancomycin (VANCOREADY) IVPB 1250 mg/250 mL  Status:  Discontinued       ? 1,250 mg ?166.7 mL/hr over 90 Minutes Intravenous Every 36 hours 06/23/21 2131 06/24/21 1537  ? 06/25/21 1000  cefTRIAXone (ROCEPHIN) 2 g in sodium chloride 0.9 % 100 mL IVPB  Status:  Discontinued       ? 2 g ?200 mL/hr over 30 Minutes Intravenous Every 24 hours 06/25/21 0909 06/26/21 1520  ? 06/24/21 1000  ceFEPIme (MAXIPIME) 2 g in sodium chloride 0.9 % 100 mL IVPB  Status:  Discontinued       ? 2 g ?200 mL/hr over 30  Minutes Intravenous Every 12 hours 06/23/21 2131 06/25/21 0909  ? 06/23/21 2045  vancomycin (VANCOREADY) IVPB 1250 mg/250 mL       ? 1,250 mg ?166.7 mL/hr over 90 Minutes Intravenous  Once 06/23/21 2037 06/23/21 2338  ? 06/23/21 2030  ceFEPIme (MAXIPIME) 2 g in sodium chloride 0.9 % 100 mL IVPB       ? 2 g ?200 mL/hr over 30 Minutes Intravenous  Once 06/23/21 2030 06/23/21 2129  ? 06/23/21 2030  metroNIDAZOLE (FLAGYL) IVPB 500 mg       ? 500 mg ?100 mL/hr over 60 Minutes Intravenous  Once 06/23/21 2030 06/23/21 2227  ? 06/23/21 2030  vancomycin (VANCOCIN) IVPB 1000 mg/200 mL premix  Status:  Discontinued       ? 1,000 mg ?200 mL/hr over 60 Minutes Intravenous  Once 06/23/21 2030 06/23/21 2037  ? ?  ? ? ?Assessment/Plan: ?Impression: Cholelithiasis with recurrent hyperbilirubinemia and elevated liver enzyme tests.  This is concerning for possible biliary stasis, choledocholithiasis.  I will postpone surgery on Monday and agree with MRCP.  Further management is pending those results. ? LOS: 3 days  ? ? ?Franky Macho ?06/27/2021  ?

## 2021-06-27 NOTE — Assessment & Plan Note (Addendum)
--  spike in bilirubin but now down to 3.  GI recalled and recommending ERCP. Transferring to [facility] per GI recommendation for ERCP.  Dr. Abbey Chatters discussed with his GI colleagues in [place] about ERCP.  ?

## 2021-06-27 NOTE — Progress Notes (Signed)
Report called to ICU RN. Patient to room 11 ?

## 2021-06-27 NOTE — Progress Notes (Signed)
?   06/27/21 1829  ?Assess: MEWS Score  ?Temp (!) 101.1 ?F (38.4 ?C)  ?BP 138/87  ?Pulse Rate (!) 127  ?Resp (!) 37  ?Level of Consciousness Alert  ?SpO2 96 %  ?O2 Device Room Air  ?Assess: MEWS Score  ?MEWS Temp 1  ?MEWS Systolic 0  ?MEWS Pulse 2  ?MEWS RR 3  ?MEWS LOC 0  ?MEWS Score 6  ?MEWS Score Color Red  ?Assess: if the MEWS score is Yellow or Red  ?Were vital signs taken at a resting state? Yes  ?Focused Assessment Change from prior assessment (see assessment flowsheet)  ?Early Detection of Sepsis Score *See Row Information* High  ?MEWS guidelines implemented *See Row Information* Yes  ?Treat  ?Pain Scale Faces  ?Pain Score 5  ?Pain Type Acute pain  ?Pain Location Abdomen  ?Pain Orientation Mid;Right;Left  ?Pain Intervention(s) Medication (See eMAR);MD notified (Comment);RN made aware  ?Take Vital Signs  ?Increase Vital Sign Frequency  Red: Q 1hr X 4 then Q 4hr X 4, if remains red, continue Q 4hrs  ?Escalate  ?MEWS: Escalate Red: discuss with charge nurse/RN and provider, consider discussing with RRT  ?Notify: Charge Nurse/RN  ?Name of Charge Nurse/RN Notified Harriett Sine RN  ?Date Charge Nurse/RN Notified 06/27/21  ?Time Charge Nurse/RN Notified 1829  ?Notify: Provider  ?Provider Name/Title Dr Laural Benes  ?Date Provider Notified 06/27/21  ?Time Provider Notified [phone]  ?Notification Type Face-to-face  ?Notification Reason Change in status ?(red mews change in status)  ?Provider response See new orders;Other (Comment) ?(move to ICU)  ?Date of Provider Response 06/27/21  ?Time of Provider Response 1829  ? ? ?

## 2021-06-28 ENCOUNTER — Inpatient Hospital Stay: Payer: Self-pay

## 2021-06-28 ENCOUNTER — Encounter (HOSPITAL_COMMUNITY): Payer: Self-pay | Admitting: Anesthesiology

## 2021-06-28 DIAGNOSIS — K8001 Calculus of gallbladder with acute cholecystitis with obstruction: Secondary | ICD-10-CM | POA: Diagnosis not present

## 2021-06-28 DIAGNOSIS — R112 Nausea with vomiting, unspecified: Secondary | ICD-10-CM | POA: Diagnosis not present

## 2021-06-28 DIAGNOSIS — N179 Acute kidney failure, unspecified: Secondary | ICD-10-CM | POA: Diagnosis not present

## 2021-06-28 DIAGNOSIS — K8309 Other cholangitis: Secondary | ICD-10-CM | POA: Diagnosis not present

## 2021-06-28 LAB — COMPREHENSIVE METABOLIC PANEL
ALT: 38 U/L (ref 0–44)
AST: 60 U/L — ABNORMAL HIGH (ref 15–41)
Albumin: 2.3 g/dL — ABNORMAL LOW (ref 3.5–5.0)
Alkaline Phosphatase: 280 U/L — ABNORMAL HIGH (ref 38–126)
Anion gap: 7 (ref 5–15)
BUN: 8 mg/dL (ref 8–23)
CO2: 21 mmol/L — ABNORMAL LOW (ref 22–32)
Calcium: 7.7 mg/dL — ABNORMAL LOW (ref 8.9–10.3)
Chloride: 104 mmol/L (ref 98–111)
Creatinine, Ser: 0.62 mg/dL (ref 0.61–1.24)
GFR, Estimated: >60 mL/min (ref >60)
Glucose, Bld: 131 mg/dL — ABNORMAL HIGH (ref 70–99)
Potassium: 3.5 mmol/L (ref 3.5–5.1)
Sodium: 132 mmol/L — ABNORMAL LOW (ref 135–145)
Total Bilirubin: 5.5 mg/dL — ABNORMAL HIGH (ref 0.3–1.2)
Total Protein: 5.2 g/dL — ABNORMAL LOW (ref 6.5–8.1)

## 2021-06-28 LAB — CBC WITH DIFFERENTIAL/PLATELET
Abs Immature Granulocytes: 0.06 10*3/uL (ref 0.00–0.07)
Basophils Absolute: 0 10*3/uL (ref 0.0–0.1)
Basophils Relative: 0 %
Eosinophils Absolute: 0 10*3/uL (ref 0.0–0.5)
Eosinophils Relative: 0 %
HCT: 31.8 % — ABNORMAL LOW (ref 39.0–52.0)
Hemoglobin: 10.5 g/dL — ABNORMAL LOW (ref 13.0–17.0)
Immature Granulocytes: 1 %
Lymphocytes Relative: 6 %
Lymphs Abs: 0.7 10*3/uL (ref 0.7–4.0)
MCH: 30.3 pg (ref 26.0–34.0)
MCHC: 33 g/dL (ref 30.0–36.0)
MCV: 91.6 fL (ref 80.0–100.0)
Monocytes Absolute: 1 10*3/uL (ref 0.1–1.0)
Monocytes Relative: 8 %
Neutro Abs: 9.8 10*3/uL — ABNORMAL HIGH (ref 1.7–7.7)
Neutrophils Relative %: 85 %
Platelets: 52 10*3/uL — ABNORMAL LOW (ref 150–400)
RBC: 3.47 MIL/uL — ABNORMAL LOW (ref 4.22–5.81)
RDW: 14.3 % (ref 11.5–15.5)
WBC: 11.6 10*3/uL — ABNORMAL HIGH (ref 4.0–10.5)
nRBC: 0 % (ref 0.0–0.2)

## 2021-06-28 LAB — PROTIME-INR
INR: 1.2 (ref 0.8–1.2)
Prothrombin Time: 14.7 seconds (ref 11.4–15.2)

## 2021-06-28 LAB — MRSA NEXT GEN BY PCR, NASAL: MRSA by PCR Next Gen: NOT DETECTED

## 2021-06-28 LAB — PROCALCITONIN: Procalcitonin: 3.74 ng/mL

## 2021-06-28 MED ORDER — LACTULOSE 10 GM/15ML PO SOLN
20.0000 g | Freq: Three times a day (TID) | ORAL | Status: DC
  Administered 2021-06-28 – 2021-06-29 (×5): 20 g via ORAL
  Filled 2021-06-28 (×5): qty 30

## 2021-06-28 MED ORDER — PIPERACILLIN-TAZOBACTAM 3.375 G IVPB
3.3750 g | Freq: Three times a day (TID) | INTRAVENOUS | Status: DC
  Administered 2021-06-28 – 2021-07-08 (×31): 3.375 g via INTRAVENOUS
  Filled 2021-06-28 (×34): qty 50

## 2021-06-28 MED ORDER — LIDOCAINE 5 % EX PTCH
1.0000 | MEDICATED_PATCH | CUTANEOUS | Status: DC
  Administered 2021-06-28 – 2021-07-07 (×10): 1 via TRANSDERMAL
  Filled 2021-06-28 (×12): qty 1

## 2021-06-28 MED ORDER — ORAL CARE MOUTH RINSE
15.0000 mL | Freq: Two times a day (BID) | OROMUCOSAL | Status: DC
  Administered 2021-06-28 – 2021-07-08 (×20): 15 mL via OROMUCOSAL

## 2021-06-28 NOTE — Progress Notes (Signed)
Pharmacy Antibiotic Note ? ?Jon Bennett a 87 y.o. male admitted on 06/28/2021 with bacteremia.  Pharmacy has been consulted for zosyn dosing. ? ?Plan: ?Zosyn 3.375g IV q8h (4 hour infusion). ?Zosyn 3.375g IV q12h (4 hour infusion). ? ?Medical History: ?Past Medical History:  ?Diagnosis Date  ? Diabetes mellitus without complication (HCC)   ? Patient is not on any medication for this  ? GERD (gastroesophageal reflux disease)   ? Hypertension   ? Pancreatitis, alcoholic, acute   ? Splenic vein thrombosis   ? ? ?Allergies:  ?Allergies  ?Allergen Reactions  ? Asa [Aspirin] Shortness Of Breath  ? ? ?Filed Weights  ? 06/27/21 2000 06/28/21 0025 06/28/21 0500  ?Weight: 69.3 kg (152 lb 12.5 oz) 69.6 kg (153 lb 7 oz) 69.3 kg (152 lb 12.5 oz)  ? ? ? ?  Latest Ref Rng & Units 06/28/2021  ?  4:05 AM 06/27/2021  ?  4:22 AM 06/26/2021  ?  2:55 AM  ?CBC  ?WBC 4.0 - 10.5 K/uL 11.6   7.7   4.6    ?Hemoglobin 13.0 - 17.0 g/dL 20.2   54.2   70.6    ?Hematocrit 39.0 - 52.0 % 31.8   33.0   30.4    ?Platelets 150 - 400 K/uL 52   83   67    ? ? ? ?Estimated Creatinine Clearance: 65.4 mL/min (by C-G formula based on SCr of 0.62 mg/dL). ? ?Antibiotics Given (last 72 hours)   ? ? Date/Time Action Medication Dose Rate  ? 06/25/21 1425 New Bag/Given  ?[EGD]  ? cefTRIAXone (ROCEPHIN) 2 g in sodium chloride 0.9 % 100 mL IVPB 2 g 200 mL/hr  ? 06/26/21 0854 New Bag/Given  ? cefTRIAXone (ROCEPHIN) 2 g in sodium chloride 0.9 % 100 mL IVPB 2 g 200 mL/hr  ? 06/27/21 1042 New Bag/Given  ? ceFAZolin (ANCEF) IVPB 2g/100 mL premix 2 g 200 mL/hr  ? 06/27/21 1412 New Bag/Given  ?[ok to give at this time per phamacist.]  ? ceFAZolin (ANCEF) IVPB 2g/100 mL premix 2 g 200 mL/hr  ? 06/27/21 1855 New Bag/Given  ? cefTRIAXone (ROCEPHIN) 2 g in sodium chloride 0.9 % 100 mL IVPB 2 g 200 mL/hr  ? ?  ? ? ?Antimicrobials this admission: ?  ?zosyn 06/28/2021 >>  ?Cefepime 4/11>> 4/12 ?Ceftriaxone 4/13 >> 4/15 ?Vancomycin 4/11 x 1 ?Cefazolin 4/15 x 1 ? ?Microbiology  results: ?4/11 BCx: E Coli, no resistance ?4/12 UCx: ngtd ?4/11 Resp Panel: negative ?4/11 MRSA PCR: negative ? ?Thank you for allowing pharmacy to be a part of this patient?s care. ? ?Luan Pulling, PharmD ?Clinical Pharmacist ? ? ?

## 2021-06-28 NOTE — Progress Notes (Signed)
Dillon RN states< per [facility], no beds available in [facility] until this pm.  Madelin Rear RN notified VAS Team will place PICC when he arrives , unless already placed by [facility]. ?

## 2021-06-28 NOTE — Progress Notes (Signed)
3 Days Post-Op  ?Subjective: ?Patient obtunded and minimally responsive. ? ?Objective: ?Vital signs in last 24 hours: ?Temp:  [98 ?F (36.7 ?C)-101.1 ?F (38.4 ?C)] 98 ?F (36.7 ?C) (04/16 0800) ?Pulse Rate:  [82-131] 95 (04/16 1000) ?Resp:  [19-37] 26 (04/16 1000) ?BP: (90-143)/(44-87) 124/78 (04/16 1000) ?SpO2:  [96 %-100 %] 98 % (04/16 1000) ?Weight:  [69.3 kg-69.6 kg] 69.3 kg (04/16 0500) ?Last BM Date : 06/27/21 ? ?Intake/Output from previous day: ?04/15 0701 - 04/16 0700 ?In: 1555.1 [I.V.:1255.1; IV Piggyback:300] ?Out: 1925 [Urine:1925] ?Intake/Output this shift: ?Total I/O ?In: 258.1 [I.V.:258.1] ?Out: 0  ? ?General appearance: no distress ?GI: soft, non-tender; bowel sounds normal; no masses,  no organomegaly ? ?Lab Results:  ?Recent Labs  ?  06/27/21 ?0422 06/28/21 ?0405  ?WBC 7.7 11.6*  ?HGB 11.4* 10.5*  ?HCT 33.0* 31.8*  ?PLT 83* 52*  ? ?BMET ?Recent Labs  ?  06/27/21 ?0422 06/28/21 ?0405  ?NA 131* 132*  ?K 3.6 3.5  ?CL 98 104  ?CO2 23 21*  ?GLUCOSE 143* 131*  ?BUN 8 8  ?CREATININE 0.62 0.62  ?CALCIUM 8.4* 7.7*  ? ?PT/INR ?No results for input(s): LABPROT, INR in the last 72 hours. ? ?Studies/Results: ?DG CHEST PORT 1 VIEW ? ?Result Date: 06/27/2021 ?CLINICAL DATA:  Lethargy, fever and tachycardia. EXAM: PORTABLE CHEST 1 VIEW COMPARISON:  June 23, 2021 FINDINGS: The heart size and mediastinal contours are within normal limits. Mildly decreased lung volumes are seen with radiopaque surgical clips overlying the medial aspect of the mid and lower left lung. Both lungs are otherwise clear. The visualized skeletal structures are unremarkable. IMPRESSION: No active cardiopulmonary disease. Electronically Signed   By: Aram Candela M.D.   On: 06/27/2021 20:13   ? ?Anti-infectives: ?Anti-infectives (From admission, onward)  ? ? Start     Dose/Rate Route Frequency Ordered Stop  ? 06/28/21 1215  piperacillin-tazobactam (ZOSYN) IVPB 3.375 g       ? 3.375 g ?12.5 mL/hr over 240 Minutes Intravenous Every 8 hours  06/28/21 1120    ? 06/27/21 1945  cefTRIAXone (ROCEPHIN) 2 g in sodium chloride 0.9 % 100 mL IVPB  Status:  Discontinued       ? 2 g ?200 mL/hr over 30 Minutes Intravenous Every 24 hours 06/27/21 1847 06/28/21 1120  ? 06/27/21 0800  ceFAZolin (ANCEF) IVPB 2g/100 mL premix  Status:  Discontinued       ? 2 g ?200 mL/hr over 30 Minutes Intravenous Every 8 hours 06/26/21 1520 06/27/21 1847  ? 06/25/21 1000  vancomycin (VANCOREADY) IVPB 1250 mg/250 mL  Status:  Discontinued       ? 1,250 mg ?166.7 mL/hr over 90 Minutes Intravenous Every 36 hours 06/23/21 2131 06/24/21 1537  ? 06/25/21 1000  cefTRIAXone (ROCEPHIN) 2 g in sodium chloride 0.9 % 100 mL IVPB  Status:  Discontinued       ? 2 g ?200 mL/hr over 30 Minutes Intravenous Every 24 hours 06/25/21 0909 06/26/21 1520  ? 06/24/21 1000  ceFEPIme (MAXIPIME) 2 g in sodium chloride 0.9 % 100 mL IVPB  Status:  Discontinued       ? 2 g ?200 mL/hr over 30 Minutes Intravenous Every 12 hours 06/23/21 2131 06/25/21 0909  ? 06/23/21 2045  vancomycin (VANCOREADY) IVPB 1250 mg/250 mL       ? 1,250 mg ?166.7 mL/hr over 90 Minutes Intravenous  Once 06/23/21 2037 06/23/21 2338  ? 06/23/21 2030  ceFEPIme (MAXIPIME) 2 g in sodium chloride 0.9 % 100  mL IVPB       ? 2 g ?200 mL/hr over 30 Minutes Intravenous  Once 06/23/21 2030 06/23/21 2129  ? 06/23/21 2030  metroNIDAZOLE (FLAGYL) IVPB 500 mg       ? 500 mg ?100 mL/hr over 60 Minutes Intravenous  Once 06/23/21 2030 06/23/21 2227  ? 06/23/21 2030  vancomycin (VANCOCIN) IVPB 1000 mg/200 mL premix  Status:  Discontinued       ? 1,000 mg ?200 mL/hr over 60 Minutes Intravenous  Once 06/23/21 2030 06/23/21 2037  ? ?  ? ? ?Assessment/Plan: ?s/p Procedure(s): ?ESOPHAGOGASTRODUODENOSCOPY (EGD) WITH PROPOFOL ?Impression: Events of yesterday noted.  Patient has become more obtunded with jaundice and elevated ammonia level.  Patient appears to be more septic, possibly due to cholangitis.  At this point, cholecystectomy is not indicated.  Have been in  discussions with Drs. Laural Benes and [employer].  Options include ERCP or IR percutaneous drainage of the biliary tree.  This is complicated by the fact the patient has thrombocytopenia.  INR is pending.  Patient is DNR.  Family has been made aware.  Agree with switching to Zosyn IV for broader coverage.  Patient needs a PICC line. ? LOS: 4 days  ? ? ?Franky Macho ?06/28/2021  ?

## 2021-06-28 NOTE — Progress Notes (Addendum)
Subjective: ?Patient is somnolent on interview and exam today.  T. bili continues to climb, aminotransferases improved. ? ?Objective: ?Vital signs in last 24 hours: ?Temp:  [98 ?F (36.7 ?C)-101.1 ?F (38.4 ?C)] 98 ?F (36.7 ?C) (04/16 0800) ?Pulse Rate:  [82-131] 95 (04/16 1000) ?Resp:  [19-37] 26 (04/16 1000) ?BP: (90-143)/(44-87) 124/78 (04/16 1000) ?SpO2:  [96 %-100 %] 98 % (04/16 1000) ?Weight:  [69.3 kg-69.6 kg] 69.3 kg (04/16 0500) ?Last BM Date : 06/27/21 ?General:   Somnolent ?Head:  Normocephalic and atraumatic. ?Eyes:  No icterus, sclera clear. Conjuctiva pink.  ?Abdomen:  Bowel sounds present, soft, non-tender, non-distended. No HSM or hernias noted. No rebound or guarding. No masses appreciated  ?Msk:  Symmetrical without gross deformities. Normal posture. ?Extremities:  Without clubbing or edema. ?Skin:  Warm and dry, intact without significant lesions.  ?Cervical Nodes:  No significant cervical adenopathy. ? ?Intake/Output from previous day: ?04/15 0701 - 04/16 0700 ?In: 1555.1 [I.V.:1255.1; IV Piggyback:300] ?Out: 1925 [Urine:1925] ?Intake/Output this shift: ?Total I/O ?In: 258.1 [I.V.:258.1] ?Out: 0  ? ?Lab Results: ?Recent Labs  ?  06/26/21 ?0255 06/27/21 ?0422 06/28/21 ?0405  ?WBC 4.6 7.7 11.6*  ?HGB 10.0* 11.4* 10.5*  ?HCT 30.4* 33.0* 31.8*  ?PLT 67* 83* 52*  ? ?BMET ?Recent Labs  ?  06/26/21 ?0255 06/27/21 ?0422 06/28/21 ?0405  ?NA 135 131* 132*  ?K 3.9 3.6 3.5  ?CL 101 98 104  ?CO2 27 23 21*  ?GLUCOSE 112* 143* 131*  ?BUN 11 8 8   ?CREATININE 0.79 0.62 0.62  ?CALCIUM 8.3* 8.4* 7.7*  ? ?LFT ?Recent Labs  ?  06/26/21 ?0255 06/27/21 ?0422 06/28/21 ?0405  ?PROT 5.3* 6.3* 5.2*  ?ALBUMIN 2.6* 2.9* 2.3*  ?AST 29 132* 60*  ?ALT 26 56* 38  ?ALKPHOS 167* 343* 280*  ?BILITOT 0.7 4.1* 5.5*  ? ?PT/INR ?No results for input(s): LABPROT, INR in the last 72 hours. ?Hepatitis Panel ?No results for input(s): HEPBSAG, HCVAB, HEPAIGM, HEPBIGM in the last 72 hours. ? ? ?Studies/Results: ?DG CHEST PORT 1 VIEW ? ?Result  Date: 06/27/2021 ?CLINICAL DATA:  Lethargy, fever and tachycardia. EXAM: PORTABLE CHEST 1 VIEW COMPARISON:  June 23, 2021 FINDINGS: The heart size and mediastinal contours are within normal limits. Mildly decreased lung volumes are seen with radiopaque surgical clips overlying the medial aspect of the mid and lower left lung. Both lungs are otherwise clear. The visualized skeletal structures are unremarkable. IMPRESSION: No active cardiopulmonary disease. Electronically Signed   By: June 25, 2021 M.D.   On: 06/27/2021 20:13   ? ?Assessment: ?*Sepsis-?Ascending cholangitis ?*Encephalopathy-worsening ?*Esophagitis ? ?Plan: ?Patient clinically worse, appears to be developing sepsis, source likely cholangitis. Discussed case with Dr. 06/29/2021 about ERCP. Recommends transfer to [facility] or [facility] given patient's difficult duodenal anatomy (duodenal diverticulum). I updated [facility] who will arrange transfer.  ? ?Encephalopathy worsening likely due to sepsis.  Agree with repeat blood cultures.  Will check ammonia level.  Also check coags. ? ?Agree with IV antibiotics. ? ?Continue on IV Protonix for esophagitis. ? ?May need platelets prior to ERCP.  ? ?Eliquis has been on hold for <72 hours.  ? ?Laural Benes. Hennie Duos, D.O. ?Gastroenterology and Hepatology ?[facility] ? ? LOS: 4 days  ? ? 06/28/2021, 10:54 AM ? ? ?

## 2021-06-28 NOTE — Progress Notes (Signed)
?PROGRESS NOTE ? ? ?Jon Bennett  JJO:[id number] DOB: Jul 27, 1934 DOA: 06/23/2021 ?PCP: [name], MD  ? ?Chief Complaint  ?Patient presents with  ? Vomiting  ? ?Level of care: Progressive ? ?Brief Admission History:  ?87 y.o. male with medical history significant of type 2 diabetes mellitus, hypertension, splenic vein thrombosis, alcohol withdrawal, GERD who presents to the emergency department due to several days of onset of nausea and vomiting and increased urinary frequency.  He denies any abdominal pain, chest pain, headache, blurry vision, shortness of breath.  Patient states that he has abstained from alcohol for several months. Patient was seen in the ED on 3/23 due to alcoholic gastritis, he was treated in the ED and discharged home.  Apparently, patient had a pint of vodka about 4 days prior to the ED visit.  ?  ?ED Course:  ?In the emergency department, patient was febrile with a temperature of 100.42F, he was tachycardic, tachypneic work-up in the ED showed leukopenia (WBC 3.5), platelets 109, BUN/creatinine 16/1.48 (baseline creatinine at 0.6-1.0), lactic acid 3.3 > 4.5 > 3.5, phosphorus 2.4, magnesium 1.5, gastric occult blood was positive.  CT abdomen and pelvis with contrast showed gallbladder distention with multiple stones, no inflammation. Slightly enlarged appearing common bile duct up to 8-9 mm which may be correlated with LFTs.  Chest x-ray showed no acute abnormality.  Patient was treated with IV cefepime, Flagyl and vancomycin.  He was treated with IV Zofran, IV hydration was provided, magnesium was replenished. ? ?His blood cultures 4/11 positive for E coli. Pharmacy changed abx to ceftriaxone on 4/13.    ? ?06/26/2021 : surgery planning for cholecystectomy in next couple of days. Narrowed antibiotics for E colic bacteremia to cefazolin starting on 4/14.   ? ?06/27/2021: spike in bilirubin to 4.1 today.  GI planning MRCP on 4/17.  Timing of cholecystectomy to be determined after further  testing.   Pt developed worsening sepsis, spiked fever, ammonia elevated, WBC and procalcitonin rising. Transferred to stepdown ICU.  Spoke with son and DNR order confirmed.  Repeated blood cultures. Broadened antibiotics.   ? ?06/28/2021:  Worsening encephalopathy today, platelets down to 52.  WBC and procalcitonin rising.  Conferenced with GI and surgery.  Recommending ERCP but GI reports he is too complex to have done at [facility].  Ordered for transfer to [facility] or [facility] per GI. Dr. Marletta Lor reaching out to GI team in [place].  I spoke with family if they wanted to proceed with ERCP.  I spoke with son [name] who is agreeable to ERCP understanding that there are significant risks involved.  He was agreeable to transfer to [facility].  ?  ?Assessment and Plan: ?* Intractable nausea and vomiting ?--continue supportive measures  ?-- no emesis since admission  ? ?Ascending cholangitis with severe sepsis ?--initially improved some but rapidly decompensating ?--GI team recommending ERCP, surgery recommending ERCP or IR percutaneous drainage of biliary tree ?--GI requested transfer to [facility] for ERCP. Dr. Marletta Lor reached out to GI colleagues in [place] regarding need for ERCP.   ?--continue IV antibiotics (broad spectrum Zosyn) ?--follow culture and sensitivities, repeated BC from 4/15 no growth to date ? ?Hyperbilirubinemia ?--spike in bilirubin continues to climb, now >5.  GI recalled and recommending ERCP. Transferring to [facility] per GI for ERCP.  Dr. Marletta Lor discussing with his GI colleagues in [place] about ERCP.  ? ?Calculus of gallbladder with acute cholecystitis and obstruction ?--Dr. Lovell Sheehan recommending ERCP or IR percutaneous drainage of biliary tree.   ? ?  Chronic anticoagulation ?--temporarily holding all anticoagulation for 72 hours per GI recommendations but also in anticipation of upcoming procedures and also concern about thrombocytopenia.   ?--monitor for bleeding complications ? ?Hypophosphatemia ?--IV  replacement ordered 4/12 and repleted.    ? ?AKI (acute kidney injury) (HCC) ?--RESOLVED after hydrating with IV fluids ? ? ?GI bleed ?--appreciate GI consultation,EGD 4/13 with findings of small mallory weiss tear ?--continue protonix as ordered for GI protection ? ?Transaminitis ?-- LFTs initially trended down with treatments but now rising with bilirubin, GI recalled and planning MRCP on 4/17.   ?--planning for lap chole with Dr. Lovell Sheehan - timing to be determined  ? ?Hypercalcemia ?--resolved after hydration with IV fluid ? ?Lactic acidosis ?-- resolved with IV fluid ? ?Hypomagnesemia ?--IV replacement ordered, recheck Mg is 1.9  ? ?Sepsis from E coli bacteremia  ?--E coli now transitioned to IV Zosyn given deterioration in condition, worsening sepsis.   ?--lactate has normalized  ?--Suspect he has ascending cholangitis and transferring to [facility] for ERCP ?--follow up repeated blood cultures ? ?GERD (gastroesophageal reflux disease) ?--protonix for GI protection  ? ?Thrombocytopenia (HCC) ?-- no active bleeding, platelets down to 52 today ?-- pt may need a platelet transfusion prior to any procedures   ? ? ?DVT prophylaxis: SCDs ?Code Status: Full  ?Family Communication: spoke with son [name] multiple times on 4/15, 4/16 ?Disposition: Status is: Inpatient ?Remains inpatient appropriate because: IV Fluid, IV antibiotics required.  ?  ?Consultants:  ?Surgery ?GI  ?Procedures:  ? ?Antimicrobials:  ?Cefepime 4/11> ?Vancomycin 4/11-4/12   ?Subjective: ?Pt was medicated with lorazepam, as he had some agitation/delirium, possibly some alcohol withdrawal   ?Objective: ?Vitals:  ? 06/28/21 1000 06/28/21 1039 06/28/21 1100 06/28/21 1200  ?BP: 124/78   122/60  ?Pulse: 95 97 87 89  ?Resp: (!) 26 (!) 29 (!) 26 12  ?Temp:      ?TempSrc:      ?SpO2: 98% 97% 97% 97%  ?Weight:      ?Height:      ? ? ?Intake/Output Summary (Last 24 hours) at 06/28/2021 1231 ?Last data filed at 06/28/2021 1212 ?Gross per 24 hour  ?Intake 2033.7 ml   ?Output 1125 ml  ?Net 908.7 ml  ? ?Filed Weights  ? 06/27/21 2000 06/28/21 0025 06/28/21 0500  ?Weight: 69.3 kg 69.6 kg 69.3 kg  ? ?Examination: ? ?General exam: pt encephalopathic, he is somnolent but arousable, follows simple commands.  ?Respiratory system: shallow BS bilateral. Respiratory effort normal. ?Cardiovascular system: normal S1 & S2 heard. No JVD, murmurs, rubs, gallops or clicks. No pedal edema. ?Gastrointestinal system: Abdomen is nondistended, soft and mild RUQ tenderness. No organomegaly or masses felt. Normal bowel sounds heard. ?Central nervous system: somnolent but arousable. No focal neurological deficits. ?Extremities: moving all extremities spontaneously.  ?Skin: bruising seen from around IV site, mild jaundice and scleral icterus seen.  ?Psychiatry: Judgement and insight appear poor.  Mood & affect appropriate.  ? ?Data Reviewed: I have personally reviewed following labs and imaging studies ? ?CBC: ?Recent Labs  ?Lab 06/23/21 ?2045 06/24/21 ?0259 06/24/21 ?8657 06/25/21 ?8469 06/26/21 ?0255 06/27/21 ?0422 06/28/21 ?0405  ?WBC 3.5* 8.3  --  5.4 4.6 7.7 11.6*  ?NEUTROABS 3.2  --   --  4.3 3.3 6.7 9.8*  ?HGB 13.2 11.6* 12.1* 11.1* 10.0* 11.4* 10.5*  ?HCT 37.9* 34.6* 35.9* 32.0* 30.4* 33.0* 31.8*  ?MCV 91.1 94.0  --  91.7 92.1 89.2 91.6  ?PLT 109* 89*  --  64* 67* 83* 52*  ? ? ?  Basic Metabolic Panel: ?Recent Labs  ?Lab 06/24/21 ?0259 06/25/21 ?0653 06/26/21 ?0255 06/27/21 ?0422 06/28/21 ?0405  ?NA 137 136 135 131* 132*  ?K 4.6 3.8 3.9 3.6 3.5  ?CL 106 102 101 98 104  ?CO2 20* 27 27 23  21*  ?GLUCOSE 167* 108* 112* 143* 131*  ?BUN 13 12 11 8 8   ?CREATININE 1.29* 0.87 0.79 0.62 0.62  ?CALCIUM 9.6 8.4* 8.3* 8.4* 7.7*  ?MG 1.5* 1.9 1.9 1.9  --   ?PHOS 2.4*  --   --   --   --   ? ? ?CBG: ?Recent Labs  ?Lab 06/24/21 ?1815 06/25/21 ?1158  ?GLUCAP 112* 95  ? ? ?Recent Results (from the past 240 hour(s))  ?Blood Culture (routine x 2)     Status: Abnormal  ? Collection Time: 06/23/21  8:45 PM  ? Specimen:  BLOOD RIGHT FOREARM  ?Result Value Ref Range Status  ? Specimen Description   Final  ?  BLOOD RIGHT FOREARM ?Performed at [facility], [address] [place], [place] [id number] ?  ? Special Requests   Final

## 2021-06-28 NOTE — Progress Notes (Signed)
Spoke with Celene Kras, RN caring for this patient. Was informed the patient will be transferred to either [facility] or [facility] campus today, 06/28/21. RN will follow-up today. ?

## 2021-06-28 NOTE — Plan of Care (Addendum)
Patient is obtunded this AM. Requires 2L O2 [place]. Turning protocol initiated. Oral care protocol initiated. Fall risk bundle implemented. Plan is for MRCP on Monday 06/29/21. ? ?Edit: 1619 hrs, 06/28/21: Plan of care now is to transfer to [facility] for ERCP vs Perc. drain to abdomen. [facility] contacted for PICC placement.  ? ?Problem: Education: ?Goal: Knowledge of General Education information will improve ?Description: Including pain rating scale, medication(s)/side effects and non-pharmacologic comfort measures ?Outcome: Progressing ?  ?Problem: Health Behavior/Discharge Planning: ?Goal: Ability to manage health-related needs will improve ?Outcome: Progressing ?  ?Problem: Clinical Measurements: ?Goal: Ability to maintain clinical measurements within normal limits will improve ?Outcome: Progressing ?Goal: Will remain free from infection ?Outcome: Progressing ?Goal: Diagnostic test results will improve ?Outcome: Progressing ?Goal: Respiratory complications will improve ?Outcome: Progressing ?Goal: Cardiovascular complication will be avoided ?Outcome: Progressing ?  ?Problem: Activity: ?Goal: Risk for activity intolerance will decrease ?Outcome: Progressing ?  ?Problem: Nutrition: ?Goal: Adequate nutrition will be maintained ?Outcome: Progressing ?  ?Problem: Coping: ?Goal: Level of anxiety will decrease ?Outcome: Progressing ?  ?Problem: Elimination: ?Goal: Will not experience complications related to bowel motility ?Outcome: Progressing ?Goal: Will not experience complications related to urinary retention ?Outcome: Progressing ?  ?Problem: Pain Managment: ?Goal: General experience of comfort will improve ?Outcome: Progressing ?  ?Problem: Safety: ?Goal: Ability to remain free from injury will improve ?Outcome: Progressing ?  ?Problem: Skin Integrity: ?Goal: Risk for impaired skin integrity will decrease ?Outcome: Progressing ?  ? ? ?

## 2021-06-29 ENCOUNTER — Inpatient Hospital Stay (HOSPITAL_COMMUNITY): Payer: Medicare Other

## 2021-06-29 ENCOUNTER — Encounter (HOSPITAL_COMMUNITY)
Admission: EM | Disposition: A | Discharge status: Skilled Nursing Facility | Payer: Self-pay | Source: Home / Self Care | Attending: Internal Medicine

## 2021-06-29 DIAGNOSIS — R112 Nausea with vomiting, unspecified: Secondary | ICD-10-CM | POA: Diagnosis not present

## 2021-06-29 DIAGNOSIS — A419 Sepsis, unspecified organism: Secondary | ICD-10-CM | POA: Diagnosis not present

## 2021-06-29 DIAGNOSIS — Z515 Encounter for palliative care: Secondary | ICD-10-CM

## 2021-06-29 DIAGNOSIS — Z7189 Other specified counseling: Secondary | ICD-10-CM | POA: Diagnosis not present

## 2021-06-29 DIAGNOSIS — K8309 Other cholangitis: Secondary | ICD-10-CM | POA: Diagnosis not present

## 2021-06-29 DIAGNOSIS — N179 Acute kidney failure, unspecified: Secondary | ICD-10-CM | POA: Diagnosis not present

## 2021-06-29 DIAGNOSIS — Z7901 Long term (current) use of anticoagulants: Secondary | ICD-10-CM | POA: Diagnosis not present

## 2021-06-29 DIAGNOSIS — E876 Hypokalemia: Secondary | ICD-10-CM | POA: Diagnosis not present

## 2021-06-29 LAB — CBC WITH DIFFERENTIAL/PLATELET
Abs Immature Granulocytes: 0.06 10*3/uL (ref 0.00–0.07)
Basophils Absolute: 0 10*3/uL (ref 0.0–0.1)
Basophils Relative: 0 %
Eosinophils Absolute: 0.1 10*3/uL (ref 0.0–0.5)
Eosinophils Relative: 2 %
HCT: 27.7 % — ABNORMAL LOW (ref 39.0–52.0)
Hemoglobin: 9.6 g/dL — ABNORMAL LOW (ref 13.0–17.0)
Immature Granulocytes: 1 %
Lymphocytes Relative: 6 %
Lymphs Abs: 0.4 10*3/uL — ABNORMAL LOW (ref 0.7–4.0)
MCH: 31.6 pg (ref 26.0–34.0)
MCHC: 34.7 g/dL (ref 30.0–36.0)
MCV: 91.1 fL (ref 80.0–100.0)
Monocytes Absolute: 0.5 10*3/uL (ref 0.1–1.0)
Monocytes Relative: 7 %
Neutro Abs: 6.4 10*3/uL (ref 1.7–7.7)
Neutrophils Relative %: 84 %
Platelets: 62 10*3/uL — ABNORMAL LOW (ref 150–400)
RBC: 3.04 MIL/uL — ABNORMAL LOW (ref 4.22–5.81)
RDW: 14.9 % (ref 11.5–15.5)
WBC: 7.5 10*3/uL (ref 4.0–10.5)
nRBC: 0 % (ref 0.0–0.2)

## 2021-06-29 LAB — COMPREHENSIVE METABOLIC PANEL
ALT: 38 U/L (ref 0–44)
AST: 78 U/L — ABNORMAL HIGH (ref 15–41)
Albumin: 2.2 g/dL — ABNORMAL LOW (ref 3.5–5.0)
Alkaline Phosphatase: 269 U/L — ABNORMAL HIGH (ref 38–126)
Anion gap: 6 (ref 5–15)
BUN: 12 mg/dL (ref 8–23)
CO2: 22 mmol/L (ref 22–32)
Calcium: 7.9 mg/dL — ABNORMAL LOW (ref 8.9–10.3)
Chloride: 108 mmol/L (ref 98–111)
Creatinine, Ser: 0.61 mg/dL (ref 0.61–1.24)
GFR, Estimated: >60 mL/min (ref >60)
Glucose, Bld: 106 mg/dL — ABNORMAL HIGH (ref 70–99)
Potassium: 3.4 mmol/L — ABNORMAL LOW (ref 3.5–5.1)
Sodium: 136 mmol/L (ref 135–145)
Total Bilirubin: 3.8 mg/dL — ABNORMAL HIGH (ref 0.3–1.2)
Total Protein: 5.1 g/dL — ABNORMAL LOW (ref 6.5–8.1)

## 2021-06-29 LAB — MAGNESIUM: Magnesium: 1.9 mg/dL (ref 1.7–2.4)

## 2021-06-29 LAB — H. PYLORI ANTIBODY, IGG: H Pylori IgG: 0.41 Index Value (ref 0.00–0.79)

## 2021-06-29 LAB — AMMONIA: Ammonia: 47 umol/L — ABNORMAL HIGH (ref 9–35)

## 2021-06-29 SURGERY — LAPAROSCOPIC CHOLECYSTECTOMY WITH INTRAOPERATIVE CHOLANGIOGRAM
Anesthesia: General

## 2021-06-29 MED ORDER — MELATONIN 3 MG PO TABS
6.0000 mg | ORAL_TABLET | Freq: Every day | ORAL | Status: DC
Start: 2021-06-29 — End: 2021-07-09
  Administered 2021-06-29 – 2021-07-07 (×9): 6 mg via ORAL
  Filled 2021-06-29 (×10): qty 2

## 2021-06-29 MED ORDER — BUPIVACAINE LIPOSOME 1.3 % IJ SUSP
INTRAMUSCULAR | Status: AC
  Filled 2021-06-29: qty 20

## 2021-06-29 MED ORDER — POTASSIUM CHLORIDE 10 MEQ/100ML IV SOLN
10.0000 meq | INTRAVENOUS | Status: AC
  Administered 2021-06-29 (×3): 10 meq via INTRAVENOUS
  Filled 2021-06-29 (×3): qty 100

## 2021-06-29 MED ORDER — LACTULOSE 10 GM/15ML PO SOLN
10.0000 g | Freq: Three times a day (TID) | ORAL | Status: DC
  Administered 2021-06-29: 10 g via ORAL
  Filled 2021-06-29: qty 30

## 2021-06-29 NOTE — Assessment & Plan Note (Signed)
--  IV replacement given, recheck in AM 

## 2021-06-29 NOTE — Progress Notes (Signed)
Patient requested for son [name] to be called. Placed call to son/Mark at [phone] [facility]), no answer. Left voicemail message for callback.  ? ?

## 2021-06-29 NOTE — Progress Notes (Signed)
? ?Gastroenterology Progress Note  ? ?Referring Provider: No ref. provider found ?Primary Care Physician:  Mertha Baars, MD ?Primary Gastroenterologist:  Dr. Laural Golden  ? ?Patient ID: Jon Bennett; [id number]; [birth year]  ? ? ?Subjective  ? ?Patient denies abdominal pain, N/V. Flexi seal in place due to loose stool on Lactulose. No overt GI bleeding.  ? ? ?Objective  ? ?Vital signs in last 24 hours ?Temp:  [97.6 ?F (36.4 ?C)-98.4 ?F (36.9 ?C)] 98.3 ?F (36.8 ?C) (04/17 6213) ?Pulse Rate:  [83-103] 94 (04/17 0722) ?Resp:  [12-29] 27 (04/17 0865) ?BP: (85-135)/(49-89) 117/65 (04/17 0700) ?SpO2:  [96 %-98 %] 97 % (04/17 0722) ?Weight:  [71.1 kg] 71.1 kg (04/17 0500) ?Last BM Date : 06/28/21 ? ?Physical Exam ?General:   Alert and oriented, pleasant ?Head:  Normocephalic and atraumatic. ?Eyes:  mild icterus ?Abdomen:  Bowel sounds present, soft, non-tender, non-distended. No HSM or hernias noted. No rebound or guarding. No masses appreciated  ?Msk:  Symmetrical without gross deformities. Normal posture. ?Extremities:  Without edema. ?Neurologic:  Alert and  oriented x4 ?Psych:  Alert and cooperative. Normal mood and affect. ? ?Intake/Output from previous day: ?04/16 0701 - 04/17 0700 ?In: 2079.1 [P.O.:100; I.V.:1878.9; IV Piggyback:100.2] ?Out: 0  ?Intake/Output this shift: ?No intake/output data recorded. ? ?Lab Results ? ?Recent Labs  ?  06/27/21 ?0422 06/28/21 ?0405 06/29/21 ?0331  ?WBC 7.7 11.6* 7.5  ?HGB 11.4* 10.5* 9.6*  ?HCT 33.0* 31.8* 27.7*  ?PLT 83* 52* 62*  ? ?BMET ?Recent Labs  ?  06/27/21 ?0422 06/28/21 ?0405 06/29/21 ?0331  ?NA 131* 132* 136  ?K 3.6 3.5 3.4*  ?CL 98 104 108  ?CO2 23 21* 22  ?GLUCOSE 143* 131* 106*  ?BUN _0 ?CREATININE 0.62 0.62 0.61  ?CALCIUM 8.4* 7.7* 7.9*  ? ?LFT ?Recent Labs  ?  06/27/21 ?0422 06/28/21 ?0405 06/29/21 ?0331  ?PROT 6.3* 5.2* 5.1*  ?ALBUMIN 2.9* 2.3* 2.2*  ?AST 132* 60* 78*  ?ALT 56* 38 38  ?ALKPHOS 343* 280* 269*  ?BILITOT 4.1* 5.5* 3.8*  ? ?PT/INR ?Recent Labs  ?   06/28/21 ?1211  ?LABPROT 14.7  ?INR 1.2  ? ? ? ?Studies/Results ?CT ABDOMEN PELVIS W CONTRAST ? ?Result Date: 06/24/2021 ?CLINICAL DATA:  Abdomen pain nausea vomiting EXAM: CT ABDOMEN AND PELVIS WITH CONTRAST TECHNIQUE: Multidetector CT imaging of the abdomen and pelvis was performed using the standard protocol following bolus administration of intravenous contrast. RADIATION DOSE REDUCTION: This exam was performed according to the departmental dose-optimization program which includes automated exposure control, adjustment of the mA and/or kV according to patient size and/or use of iterative reconstruction technique. CONTRAST:  37m OMNIPAQUE IOHEXOL 300 MG/ML  SOLN COMPARISON:  CT 04/21/2020 FINDINGS: Lower chest: Lung bases demonstrate scarring or atelectasis in the left lung base. Moderate to large hiatal hernia. Normal cardiac size. Hepatobiliary: No focal hepatic abnormality. Distended gallbladder with multiple stones. No inflammatory changes. Slightly enlarged common bile duct up to 8-9 mm. No calcified stones along the course of the duct Pancreas: Atrophic.  No inflammatory changes. Spleen: Normal in size without focal abnormality. Adrenals/Urinary Tract: Adrenal glands are normal. Kidneys show no hydronephrosis. Cyst lower pole right kidney, no follow-up imaging recommended. The bladder is unremarkable Stomach/Bowel: The stomach is nonenlarged. No dilated small bowel. No acute bowel wall thickening. The appendix is nonvisualized corresponding to history of appendectomy. Vascular/Lymphatic: Moderate aortic atherosclerosis. No aneurysm. No suspicious lymph nodes Reproductive: Post treatment changes in the region of the prostate gland. Other: Negative  for pelvic effusion or free air. Musculoskeletal: Fatty atrophy of the left rectus sheath. No acute osseous abnormality IMPRESSION: 1. Gallbladder distension with multiple stones but no inflammation. Slightly enlarged appearing common bile duct up to 8-9 mm which  may be correlated with LFTs. 2. Otherwise no CT evidence for acute intra-abdominal or pelvic abnormality. Electronically Signed   By: Donavan Foil M.D.   On: 06/24/2021 00:32  ? ?US Abdomen Limited ? ?Result Date: 06/24/2021 ?CLINICAL DATA:  87 year old male with nausea and vomiting. EXAM: ULTRASOUND ABDOMEN LIMITED RIGHT UPPER QUADRANT COMPARISON:  CT abdomen pelvis from earlier the same day FINDINGS: Gallbladder: The gallbladder is distended with layering sludge and gallstones. No gallbladder wall thickening or pericholecystic fluid. No sonographic Murphy sign. Common bile duct: Diameter: 6 mm Liver: No focal lesion identified. No evidence of intrahepatic biliary ductal dilation. Within normal limits in parenchymal echogenicity and echotexture. Portal vein is patent on color Doppler imaging with normal direction of blood flow towards the liver. Other: No perihepatic ascites. IMPRESSION: Distended gallbladder with cholelithiasis and sludge; no sonographic evidence of acute cholecystitis. Ruthann Cancer, MD [facility] Radiology Electronically Signed   By: Ruthann Cancer M.D.   On: 06/24/2021 09:00  ? ?DG CHEST PORT 1 VIEW ? ?Result Date: 06/27/2021 ?CLINICAL DATA:  Lethargy, fever and tachycardia. EXAM: PORTABLE CHEST 1 VIEW COMPARISON:  June 23, 2021 FINDINGS: The heart size and mediastinal contours are within normal limits. Mildly decreased lung volumes are seen with radiopaque surgical clips overlying the medial aspect of the mid and lower left lung. Both lungs are otherwise clear. The visualized skeletal structures are unremarkable. IMPRESSION: No active cardiopulmonary disease. Electronically Signed   By: Virgina Norfolk M.D.   On: 06/27/2021 20:13  ? ?DG Chest Port 1 View ? ?Result Date: 06/23/2021 ?CLINICAL DATA:  Possible sepsis EXAM: PORTABLE CHEST 1 VIEW COMPARISON:  08/24/2020 FINDINGS: Cardiac shadow is stable. Postsurgical changes are noted. No focal  infiltrate or effusion is seen. No bony abnormality is noted. IMPRESSION: No acute abnormality seen. Electronically Signed   By: Inez Catalina M.D.   On: 06/23/2021 22:23  ? ?[place] EKG SITE RITE ? ?Result Date: 06/28/2021 ?If [employer] not attached, placement could not be confirmed due to current cardiac rhythm.  ? ?Assessment  ?87 y.o. male with a history of stroke in the past, splenic vein thrombosis in 2021, DM, HTN, alcohol use, GERD, presenting with N/V, coffee-ground emesis, fever, elevated LFTs and CT abd/pelvis with contrast noting cholelithiasis, dilated CBD up to 8-9 mm without obvious stone. Blood cultures positive for E.coli on 4/11. Admitted with concern for ascending cholangitis with sepsis.  ? ?Coffee-ground emesis: EGD 4/13 with LA Grade B esophagitis, MW tear as source of bleeding, duodenitis, and non-bleeding diverticulum. Hgb 9.6 today but without overt GI bleeding. Admitting Hgb 13.2. H.pylori serology pending.  ? ?Elevated LFTs: bilirubin and transaminases had normalized several days ago but bump noted thereafter, with Tbili peaking at 5.5 yesterday and currently 3.8. AST 78, Alk Phos 269 this morning. Worsening sepsis on 4/15 with spiked fever and mental status changes. Due to difficult duodenal anatomy, we had recommend transfer to [facility] for ERCP. Transfer orders are in place but no bed as of yet. US abdomen this morning with CBD 0.5 cm. LFTs trending down, bilirubin trending down. Afebrile currently. Encephalopathy improved this morning. Nor a candidate for cholecystectomy currently. Surgery on board.  ? ?Awaiting bed in [place]. Limited options here at [facility] as  outlined in multiple notes.  ? ? ? ?Plan / Recommendations  ?Continue antibiotics ?PPI BID ?Follow-up on H.pylori serology ?Anticoagulation on hold ?Needs transfer to [facility], awaiting bed ? ? ? LOS: 5 days  ? ? 06/29/2021, 8:21 AM ? ?Annitta Needs, PhD, ANP-BC ?[facility] Gastroenterology  ? ? ? ?

## 2021-06-29 NOTE — Consult Note (Signed)
? ?                                                                                ?Consultation Note ?Date: 06/29/2021  ? ?Patient Name: Jon Bennett  ?DOB: 20-Dec-1934  MRN: [id number]  Age / Sex: 87 y.o., male  ?PCP: Mertha Baars, MD ?Referring Physician: Murlean Iba, MD ? ?Reason for Consultation: Establishing goals of care ? ?HPI/Patient Profile: 87 y.o. male  with past medical history of diabetes, hypertension, splenic vein thrombosis, alcohol withdrawal, GERD admitted on 06/23/2021 with nausea/vomiting, increased urinary frequency and diagnosed with ascending cholangitis with severe sepsis. Bilirubin is climbing and plans for potential ERCP vs IR perc drain for calculus of gallbladder with acute cholecystitis and obstruction.  ? ?Clinical Assessment and Goals of Care: ?I have reviewed records and notes from hospitalist, GI, surgery. Labs reviewed with noted increasing bilirubin, improved ammonia, improved WBC. I met today with Mr. Dimperio. He is lying in bed and feeding himself lunch. His appetite is fair - he ate ~35% of his meal and wanted no more. Mr. Lincoln is able to tell me that he knows he is waiting for a procedure/test at [facility] but is not clear on his medical issues. From notes he has been confused but his confusion seems to be clearing and he is asking appropriate questions today. I explained his cholangitis/choledocholithiasis how this which has caused a severe infection and stressing his liver. He verbalizes understanding and willingness for transfer and procedure with hopes of improvement.  ? ?Mr. Plitt shares with me that he has 4 sons (1 son he lives with [name], 1 son Clair Gulling who is MD, and twins in [place]). His wife is deceased. Mr. Packard speaks highly and proudly of his children. He verifies that he would trust any of them to make medical decisions for him but especially Clair Gulling since he is a physician and [name] since he is local. Mr. Mcaffee does not really recall having any  conversations with his family about his wishes but is clear that he would trust their decisions. He is unable to articulate any specific wishes or concerns for himself. He is hopeful to proceed with surgery and ultimately be able to return to his home. Noted that family have opted for DNR status.  ? ?I attempted to call and speak with son, [name]. Left voicemail. For now it seems that goals are clear per notes with DNR in place and patient/family in agreement to proceed with transfer for ERCP.  ? ?All questions/concerns addressed. Emotional support provided.   ? ?Primary Decision Maker ?NEXT OF KIN specifically Clair Gulling and [name] (who are both emergency contacts) ?  ? ?SUMMARY OF RECOMMENDATIONS   ?- Goals clear for now with DNR in place and plans for transfer to [facility] for ERCP. He is showing signs of improvement. Palliative to shadow and re-engage as needed.  ? ?Code Status/Advance Care Planning: ?DNR ? ? ?Symptom Management:  ?Per attending, GI.  ? ?Palliative Prophylaxis:  ?Aspiration, Bowel Regimen, and Delirium Protocol ? ?Prognosis:  ?Unable to determine ? ?Discharge Planning: To Be Determined  ? ?  ? ?Primary Diagnoses: ?Present on Admission: ? Intractable nausea and  vomiting ? Thrombocytopenia (HCC) ? GERD (gastroesophageal reflux disease) ? Sepsis from E coli bacteremia  ? Hypomagnesemia ? Lactic acidosis ? Hypercalcemia ? Transaminitis ? GI bleed ? AKI (acute kidney injury) (HCC) ? Hypophosphatemia ? Ascending cholangitis with severe sepsis ? Calculus of gallbladder with acute cholecystitis and obstruction ? Hyperbilirubinemia ? ? ?I have reviewed the medical record, interviewed the patient and family, and examined the patient. The following aspects are pertinent. ? ?Past Medical History:  ?Diagnosis Date  ? Diabetes mellitus without complication (HCC)   ? Patient is not on any medication for this  ? GERD (gastroesophageal reflux disease)   ? Hypertension   ? Pancreatitis, alcoholic, acute   ? Splenic vein  thrombosis   ? ?Social History  ? ?Socioeconomic History  ? Marital status: Widowed  ?  Spouse name: Not on file  ? Number of children: 4  ? Years of education: Not on file  ? Highest education level: Not on file  ?Occupational History  ? Occupation: retired  ?Tobacco Use  ? Smoking status: Never  ? Smokeless tobacco: Never  ?Vaping Use  ? Vaping Use: Never used  ?Substance and Sexual Activity  ? Alcohol use: Not Currently  ?  Alcohol/week: 1.0 standard drink  ?  Types: 1 Shots of liquor per week  ? Drug use: Never  ? Sexual activity: Not Currently  ?Other Topics Concern  ? Not on file  ?Social History Narrative  ? Not on file  ? ?Social Determinants of Health  ? ?Financial Resource Strain: Not on file  ?Food Insecurity: Not on file  ?Transportation Needs: Not on file  ?Physical Activity: Not on file  ?Stress: Not on file  ?Social Connections: Not on file  ? ?Family History  ?Problem Relation Age of Onset  ? Hypertension Other   ? Heart disease Father   ? Healthy Sister   ? Healthy Brother   ? Cancer Maternal Grandfather   ? Healthy Brother   ? Healthy Brother   ? Healthy Sister   ? Healthy Sister   ? Healthy Sister   ? Healthy Brother   ? Healthy Son   ? Healthy Son   ? Healthy Son   ? Healthy Son   ? ?Scheduled Meds: ? Chlorhexidine Gluconate Cloth  6 each Topical Daily  ? folic acid  1 mg Oral Daily  ? lactulose  20 g Oral TID  ? lidocaine  1 patch Transdermal Q24H  ? mouth rinse  15 mL Mouth Rinse BID  ? melatonin  6 mg Oral QHS  ? multivitamin with minerals  1 tablet Oral Daily  ? pantoprazole (PROTONIX) IV  40 mg Intravenous Q12H  ? thiamine  100 mg Oral Daily  ? Or  ? thiamine  100 mg Intravenous Daily  ? ?Continuous Infusions: ? sodium chloride 100 mL/hr at 06/29/21 0405  ? piperacillin-tazobactam (ZOSYN)  IV 3.375 g (06/29/21 0531)  ? potassium chloride 10 mEq (06/29/21 1011)  ? ?PRN Meds:.acetaminophen, diphenhydrAMINE, metoCLOPramide (REGLAN) injection, prochlorperazine ?Allergies  ?Allergen Reactions  ?  Asa [Aspirin] Shortness Of Breath  ? ?Review of Systems  ?Constitutional:  Positive for activity change, appetite change and fatigue.  ?Respiratory:  Negative for shortness of breath.   ?Gastrointestinal:  Negative for abdominal pain, nausea and vomiting.  ?Musculoskeletal:   ?     Neck pain - provided heat pack  ?Neurological:  Positive for weakness.  ? ?Physical Exam ?Vitals and nursing note reviewed.  ?Constitutional:   ?  General: He is not in acute distress. ?   Appearance: He is ill-appearing.  ?Cardiovascular:  ?   Rate and Rhythm: Normal rate.  ?Pulmonary:  ?   Effort: No tachypnea, accessory muscle usage or respiratory distress.  ?Abdominal:  ?   Palpations: Abdomen is soft.  ?   Tenderness: There is no abdominal tenderness.  ?Neurological:  ?   Mental Status: He is alert.  ?   Comments: Oriented to person and place and somewhat to situation  ? ? ?Vital Signs: BP 117/65   Pulse 94   Temp 98.3 ?F (36.8 ?C) (Oral)   Resp (!) 27   Ht 5' 7.5" (1.715 m)   Wt 71.1 kg   SpO2 97%   BMI 24.19 kg/m?  ?Pain Scale: 0-10 ?  ?Pain Score: 3  ? ? ?SpO2: SpO2: 97 % ?O2 Device:SpO2: 97 % ?O2 Flow Rate: .O2 Flow Rate (L/min): 2 L/min ? ?IO: Intake/output summary:  ?Intake/Output Summary (Last 24 hours) at 06/29/2021 1058 ?Last data filed at 06/29/2021 1020 ?Gross per 24 hour  ?Intake 1821.01 ml  ?Output 550 ml  ?Net 1271.01 ml  ? ? ?LBM: Last BM Date : 06/28/21 ?Baseline Weight: Weight: 68 kg ?Most recent weight: Weight: 71.1 kg     ?Palliative Assessment/Data: ? ? ? ? ?Time In: 1100 ? ?Time Total: 55 min ? ?Greater than 50%  of this time was spent counseling and coordinating care related to the above assessment and plan. ? ?Signed by: ?Vinie Sill, NP ?Palliative Medicine Team ?Pager # [phone] (M-F 8a-5p) ?Team Phone # [phone] (Nights/Weekends) ?  ? ? ? ? ? ? ? ? ? ? ? ? ?  ?

## 2021-06-29 NOTE — Progress Notes (Signed)
?PROGRESS NOTE ? ? ?Jon Bennett  ONG:[id number] DOB: Aug 12, 1934 DOA: 06/23/2021 ?PCP: [name], MD  ? ?Chief Complaint  ?Patient presents with  ? Vomiting  ? ?Level of care: Progressive ? ?Brief Admission History:  ?87 y.o. male with medical history significant of type 2 diabetes mellitus, hypertension, splenic vein thrombosis, alcohol withdrawal, GERD who presents to the emergency department due to several days of onset of nausea and vomiting and increased urinary frequency.  He denies any abdominal pain, chest pain, headache, blurry vision, shortness of breath.  Patient states that he has abstained from alcohol for several months. Patient was seen in the ED on 3/23 due to alcoholic gastritis, he was treated in the ED and discharged home.  Apparently, patient had a pint of vodka about 4 days prior to the ED visit.  ?  ?ED Course:  ?In the emergency department, patient was febrile with a temperature of 100.66F, he was tachycardic, tachypneic work-up in the ED showed leukopenia (WBC 3.5), platelets 109, BUN/creatinine 16/1.48 (baseline creatinine at 0.6-1.0), lactic acid 3.3 > 4.5 > 3.5, phosphorus 2.4, magnesium 1.5, gastric occult blood was positive.  CT abdomen and pelvis with contrast showed gallbladder distention with multiple stones, no inflammation. Slightly enlarged appearing common bile duct up to 8-9 mm which may be correlated with LFTs.  Chest x-ray showed no acute abnormality.  Patient was treated with IV cefepime, Flagyl and vancomycin.  He was treated with IV Zofran, IV hydration was provided, magnesium was replenished. ? ?His blood cultures 4/11 positive for E coli. Pharmacy changed abx to ceftriaxone on 4/13.    ? ?06/26/2021 : surgery planning for cholecystectomy in next couple of days. Narrowed antibiotics for E colic bacteremia to cefazolin starting on 4/14.   ? ?06/27/2021: spike in bilirubin to 4.1 today.  GI planning MRCP on 4/17.  Timing of cholecystectomy to be determined after further  testing.   Pt developed worsening sepsis, spiked fever, ammonia elevated, WBC and procalcitonin rising. Transferred to stepdown ICU.  Spoke with son and DNR order confirmed.  Repeated blood cultures. Broadened antibiotics.   ? ?06/28/2021:  Worsening encephalopathy today, platelets down to 52.  WBC and procalcitonin rising.  Conferenced with GI and surgery.  Recommending ERCP but GI reports he is too complex to have done at [facility].  Ordered for transfer to [facility] or [facility] per GI. Dr. Marletta Lor reaching out to GI team in [place].  I spoke with family if they wanted to proceed with ERCP.  I spoke with son Jon Bennett who is agreeable to ERCP understanding that there are significant risks involved.  He was agreeable to transfer to [facility].  ? ?06/29/2021: pt still waiting for transfer to [facility] for ERCP.  Cancelled NPO order and allowed to eat.  Ammonia coming down.  I updated son Jon Bennett about situation with transfer. Pt clinically appears nontoxic.  Bilirubin remains elevated but slightly improved today.   ?  ?Assessment and Plan: ?* Intractable nausea and vomiting ?--continue supportive measures  ?-- no emesis since admission  ? ?Ascending cholangitis with severe sepsis ?--initially improved some but rapidly decompensating ?--GI team recommending ERCP, surgery recommending ERCP or IR percutaneous drainage of biliary tree ?--GI requested transfer to [facility] for ERCP. Dr. Marletta Lor reached out to GI colleagues in [place] regarding need for ERCP.   ?--continue IV antibiotics (broad spectrum Zosyn) ?--follow culture and sensitivities, repeated BC from 4/15 no growth to date ? ?Hypokalemia ?-- IV replacement given, recheck in AM ? ?Hyperbilirubinemia ?--spike  in bilirubin but now down to 3.  GI recalled and recommending ERCP. Transferring to [facility] per GI for ERCP.  Dr. Marletta Lor discussing with his GI colleagues in [place] about ERCP.  ? ?Calculus of gallbladder with acute cholecystitis and obstruction ?--Dr. Lovell Sheehan  recommending ERCP or IR percutaneous drainage of biliary tree.   ? ?Chronic anticoagulation ?--temporarily holding all anticoagulation for 72 hours per GI recommendations but also in anticipation of upcoming procedures and also concern about thrombocytopenia.   ?--monitor for bleeding complications ? ?Hypophosphatemia ?--IV replacement ordered 4/12 and repleted.    ? ?AKI (acute kidney injury) (HCC) ?--RESOLVED after hydrating with IV fluids ? ? ?GI bleed ?--appreciate GI consultation,EGD 4/13 with findings of small mallory weiss tear ?--continue protonix as ordered for GI protection ? ?Transaminitis ?-- LFTs initially trended down with treatments but now rising with bilirubin, GI recalled and planning MRCP on 4/17.   ?--planning for lap chole with Dr. Lovell Sheehan - timing to be determined  ? ?Hypercalcemia ?--resolved after hydration with IV fluid ? ?Lactic acidosis ?-- resolved with IV fluid ? ?Hypomagnesemia ?--IV replacement ordered, recheck Mg is 1.9  ? ?Sepsis from E coli bacteremia  ?--E coli now transitioned to IV Zosyn given deterioration in condition, worsening sepsis.   ?--lactate has normalized  ?--Suspect he has ascending cholangitis and transferring to [facility] for ERCP ?--follow up repeated blood cultures ? ?GERD (gastroesophageal reflux disease) ?--protonix for GI protection  ? ?Thrombocytopenia (HCC) ?-- no active bleeding, platelets down to 62 today ?-- pt may need a platelet transfusion prior to any procedures   ? ? ?DVT prophylaxis: SCDs ?Code Status: Full  ?Family Communication: spoke with son Jon Bennett multiple times on 4/15, 4/16 ?Disposition: Status is: Inpatient ?Remains inpatient appropriate because: IV Fluid, IV antibiotics required.  ?  ?Consultants:  ?Surgery ?GI  ?Procedures:  ? ?Antimicrobials:  ?Cefepime 4/11> ?Vancomycin 4/11-4/12   ?Subjective: ?Pt says he want to eat if he is not having the ERCP today.   ?Objective: ?Vitals:  ? 06/29/21 0900 06/29/21 1000 06/29/21 1100 06/29/21 1110  ?BP:  137/79 115/60 136/77   ?Pulse: 93 89 83 84  ?Resp: (!) 22 (!) 22 20 17   ?Temp:    98.3 ?F (36.8 ?C)  ?TempSrc:    Oral  ?SpO2: 97% 97% 97% 98%  ?Weight:      ?Height:      ? ? ?Intake/Output Summary (Last 24 hours) at 06/29/2021 1433 ?Last data filed at 06/29/2021 1209 ?Gross per 24 hour  ?Intake 1855.08 ml  ?Output 550 ml  ?Net 1305.08 ml  ? ?Filed Weights  ? 06/28/21 0025 06/28/21 0500 06/29/21 0500  ?Weight: 69.6 kg 69.3 kg 71.1 kg  ? ?Examination: ? ?General exam: pt less confused today, he is awake, and able to answer questions, follows simple commands.  ?Respiratory system: shallow BS bilateral no rales heard. Respiratory effort normal. ?Cardiovascular system: normal S1 & S2 heard. No JVD, murmurs, rubs, gallops or clicks. No pedal edema. ?Gastrointestinal system: Abdomen is nondistended, soft and mild RUQ tenderness. No organomegaly or masses felt. Normal bowel sounds heard. ?Central nervous system: somnolent but arousable. No focal neurological deficits. ?Extremities: moving all extremities spontaneously.  ?Skin: bruising seen from around IV site, mild jaundice and scleral icterus seen.  ?Psychiatry: Judgement and insight appear UTD.  Mood & affect appropriate.  ? ?Data Reviewed: I have personally reviewed following labs and imaging studies ? ?CBC: ?Recent Labs  ?Lab 06/25/21 ?0653 06/26/21 ?0255 06/27/21 ?0422 06/28/21 ?0405 06/29/21 ?0331  ?WBC  5.4 4.6 7.7 11.6* 7.5  ?NEUTROABS 4.3 3.3 6.7 9.8* 6.4  ?HGB 11.1* 10.0* 11.4* 10.5* 9.6*  ?HCT 32.0* 30.4* 33.0* 31.8* 27.7*  ?MCV 91.7 92.1 89.2 91.6 91.1  ?PLT 64* 67* 83* 52* 62*  ? ? ?Basic Metabolic Panel: ?Recent Labs  ?Lab 06/24/21 ?0259 06/25/21 ?0653 06/26/21 ?0255 06/27/21 ?0422 06/28/21 ?0405 06/29/21 ?[id number]  ?NA 137 136 135 131* 132* 136  ?K 4.6 3.8 3.9 3.6 3.5 3.4*  ?CL 106 102 101 98 104 108  ?CO2 20* 27 27 23  21* 22  ?GLUCOSE 167* 108* 112* 143* 131* 106*  ?BUN 13 12 11 8 8 12   ?CREATININE 1.29* 0.87 0.79 0.62 0.62 0.61  ?CALCIUM 9.6 8.4* 8.3* 8.4* 7.7*  7.9*  ?MG 1.5* 1.9 1.9 1.9  --  1.9  ?PHOS 2.4*  --   --   --   --   --   ? ? ?CBG: ?Recent Labs  ?Lab 06/24/21 ?1815 06/25/21 ?1158  ?GLUCAP 112* 95  ? ? ?Recent Results (from the past 240 hour(s))  ?Blood Cultur

## 2021-06-30 DIAGNOSIS — Z7901 Long term (current) use of anticoagulants: Secondary | ICD-10-CM | POA: Diagnosis not present

## 2021-06-30 DIAGNOSIS — R112 Nausea with vomiting, unspecified: Secondary | ICD-10-CM | POA: Diagnosis not present

## 2021-06-30 DIAGNOSIS — K8001 Calculus of gallbladder with acute cholecystitis with obstruction: Secondary | ICD-10-CM | POA: Diagnosis not present

## 2021-06-30 DIAGNOSIS — R7401 Elevation of levels of liver transaminase levels: Secondary | ICD-10-CM | POA: Diagnosis not present

## 2021-06-30 DIAGNOSIS — K922 Gastrointestinal hemorrhage, unspecified: Secondary | ICD-10-CM | POA: Diagnosis not present

## 2021-06-30 DIAGNOSIS — K8309 Other cholangitis: Secondary | ICD-10-CM | POA: Diagnosis not present

## 2021-06-30 LAB — COMPREHENSIVE METABOLIC PANEL
ALT: 34 U/L (ref 0–44)
AST: 50 U/L — ABNORMAL HIGH (ref 15–41)
Albumin: 2.2 g/dL — ABNORMAL LOW (ref 3.5–5.0)
Alkaline Phosphatase: 284 U/L — ABNORMAL HIGH (ref 38–126)
Anion gap: 6 (ref 5–15)
BUN: 10 mg/dL (ref 8–23)
CO2: 21 mmol/L — ABNORMAL LOW (ref 22–32)
Calcium: 7.8 mg/dL — ABNORMAL LOW (ref 8.9–10.3)
Chloride: 109 mmol/L (ref 98–111)
Creatinine, Ser: 0.71 mg/dL (ref 0.61–1.24)
GFR, Estimated: >60 mL/min (ref >60)
Glucose, Bld: 114 mg/dL — ABNORMAL HIGH (ref 70–99)
Potassium: 3.4 mmol/L — ABNORMAL LOW (ref 3.5–5.1)
Sodium: 136 mmol/L (ref 135–145)
Total Bilirubin: 2.6 mg/dL — ABNORMAL HIGH (ref 0.3–1.2)
Total Protein: 5.2 g/dL — ABNORMAL LOW (ref 6.5–8.1)

## 2021-06-30 LAB — CBC WITH DIFFERENTIAL/PLATELET
Abs Immature Granulocytes: 0.04 10*3/uL (ref 0.00–0.07)
Basophils Absolute: 0 10*3/uL (ref 0.0–0.1)
Basophils Relative: 1 %
Eosinophils Absolute: 0.2 10*3/uL (ref 0.0–0.5)
Eosinophils Relative: 3 %
HCT: 26 % — ABNORMAL LOW (ref 39.0–52.0)
Hemoglobin: 9.3 g/dL — ABNORMAL LOW (ref 13.0–17.0)
Immature Granulocytes: 1 %
Lymphocytes Relative: 9 %
Lymphs Abs: 0.5 10*3/uL — ABNORMAL LOW (ref 0.7–4.0)
MCH: 32 pg (ref 26.0–34.0)
MCHC: 35.8 g/dL (ref 30.0–36.0)
MCV: 89.3 fL (ref 80.0–100.0)
Monocytes Absolute: 0.4 10*3/uL (ref 0.1–1.0)
Monocytes Relative: 8 %
Neutro Abs: 4.1 10*3/uL (ref 1.7–7.7)
Neutrophils Relative %: 78 %
Platelets: 77 10*3/uL — ABNORMAL LOW (ref 150–400)
RBC: 2.91 MIL/uL — ABNORMAL LOW (ref 4.22–5.81)
RDW: 14.7 % (ref 11.5–15.5)
WBC: 5.2 10*3/uL (ref 4.0–10.5)
nRBC: 0 % (ref 0.0–0.2)

## 2021-06-30 LAB — IRON AND TIBC
Iron: 34 ug/dL — ABNORMAL LOW (ref 45–182)
Saturation Ratios: 16 % — ABNORMAL LOW (ref 17.9–39.5)
TIBC: 207 ug/dL — ABNORMAL LOW (ref 250–450)
UIBC: 173 ug/dL

## 2021-06-30 LAB — FERRITIN: Ferritin: 282 ng/mL (ref 24–336)

## 2021-06-30 LAB — MAGNESIUM: Magnesium: 1.9 mg/dL (ref 1.7–2.4)

## 2021-06-30 MED ORDER — LACTULOSE 10 GM/15ML PO SOLN
10.0000 g | Freq: Every day | ORAL | Status: DC
  Administered 2021-06-30: 10 g via ORAL
  Filled 2021-06-30: qty 30

## 2021-06-30 MED ORDER — OXYCODONE HCL 5 MG PO TABS
5.0000 mg | ORAL_TABLET | Freq: Four times a day (QID) | ORAL | Status: DC | PRN
  Administered 2021-06-30 – 2021-07-06 (×7): 5 mg via ORAL
  Filled 2021-06-30 (×8): qty 1

## 2021-06-30 NOTE — Progress Notes (Signed)
?PROGRESS NOTE ? ? ?Jon Bennett  [id number] DOB: [birth year] DOA: 06/23/2021 ?PCP: Mertha Baars, MD  ? ?Chief Complaint  ?Patient presents with  ? Vomiting  ? ?Level of care: Progressive ? ?Brief Admission History:  ?87 y.o. male with medical history significant of type 2 diabetes mellitus, hypertension, splenic vein thrombosis, alcohol withdrawal, GERD who presents to the emergency department due to several days of onset of nausea and vomiting and increased urinary frequency.  He denies any abdominal pain, chest pain, headache, blurry vision, shortness of breath.  Patient states that he has abstained from alcohol for several months. Patient was seen in the ED on [id number] due to alcoholic gastritis, he was treated in the ED and discharged home.  Apparently, patient had a pint of vodka about 4 days prior to the ED visit.  ?  ?ED Course:  ?In the emergency department, patient was febrile with a temperature of 100.87F, he was tachycardic, tachypneic work-up in the ED showed leukopenia (WBC 3.5), platelets 109, BUN/creatinine 16/1.48 (baseline creatinine at 0.6-1.0), lactic acid 3.3 > 4.5 > 3.5, phosphorus 2.4, magnesium 1.5, gastric occult blood was positive.  CT abdomen and pelvis with contrast showed gallbladder distention with multiple stones, no inflammation. Slightly enlarged appearing common bile duct up to 8-9 mm which may be correlated with LFTs.  Chest x-ray showed no acute abnormality.  Patient was treated with IV cefepime, Flagyl and vancomycin.  He was treated with IV Zofran, IV hydration was provided, magnesium was replenished. ? ?His blood cultures 4/11 positive for E coli. Pharmacy changed abx to ceftriaxone on 4/13.    ? ?06/26/2021 : surgery planning for cholecystectomy in next couple of days. Narrowed antibiotics for E colic bacteremia to cefazolin starting on 4/14.   ? ?06/27/2021: spike in bilirubin to 4.1 today.  GI planning MRCP on 4/17.  Timing of cholecystectomy to be determined after further  testing.   Pt developed worsening sepsis, spiked fever, ammonia elevated, WBC and procalcitonin rising. Transferred to stepdown ICU.  Spoke with son and DNR order confirmed.  Repeated blood cultures. Broadened antibiotics.   ? ?06/28/2021:  Worsening encephalopathy today, platelets down to 52.  WBC and procalcitonin rising.  Conferenced with GI and surgery.  Recommending ERCP but GI reports he is too complex to have done at [facility].  [facility] or [facility]. Dr. Abbey Chatters reaching out to GI team in [place].  I spoke with family if they wanted to proceed with ERCP.  I spoke with son Jon Bennett who is agreeable to ERCP understanding that there are significant risks involved.  He was agreeable to transfer to [facility].  ? ?06/29/2021: pt still waiting for transfer to [facility] for ERCP.  Cancelled NPO order and allowed to eat.  Ammonia coming down.  I updated son Jon Bennett about situation with transfer. Pt clinically appears nontoxic.  Bilirubin remains elevated but slightly improved today.   ? ?06/30/2021: bilirubin down to 2.6 today.  Encephalopathy improved and lactulose discontinued due to loose stools.   ?  ?Assessment and Plan: ?* Intractable nausea and vomiting ?--continue supportive measures  ?-- no emesis since admission  ? ?Ascending cholangitis with severe sepsis ?--initially improved some but rapidly decompensating ?--GI team recommending ERCP, surgery recommending ERCP or IR percutaneous drainage of biliary tree ?--GI requested transfer to [facility] for [facility]. Dr. Abbey Chatters reached out to GI colleagues in [place] regarding need for ERCP.   ?--continue IV antibiotics (broad spectrum Zosyn) ?--follow culture and sensitivities, repeated  BC from 4/15 no growth to date ? ?Hypokalemia ?-- IV replacement given, recheck in AM ? ?Hyperbilirubinemia ?--spike in bilirubin but now down to 3.  GI recalled and recommending ERCP. Transferring to [facility] per GI recommendation for ERCP.  Dr. Abbey Chatters discussed with  his GI colleagues in [place] about ERCP.  ? ?Calculus of gallbladder with acute cholecystitis and obstruction ?--Dr. Arnoldo Morale recommending ERCP or IR percutaneous drainage of biliary tree.   ? ?Chronic anticoagulation ?--temporarily holding all anticoagulation for 72 hours per GI recommendations but also in anticipation of upcoming procedures and also concern about thrombocytopenia.   ?--monitor for bleeding complications ? ?Hypophosphatemia ?--IV replacement ordered 4/12 and repleted.    ? ?AKI (acute kidney injury) ([place]) ?--RESOLVED after hydrating with IV fluids ? ? ?GI bleed ?--appreciate GI consultation, EGD 4/13 with findings of small mallory weiss tear ?--continue protonix as ordered for GI protection ? ?Transaminitis ?-- LFTs initially trended down with treatments but now rising with bilirubin, GI recalled and planning MRCP on 4/17.   ?--planning for lap chole with Dr. Arnoldo Morale - timing to be determined  ? ?Hypercalcemia ?--resolved after hydration with IV fluid ? ?Lactic acidosis ?-- resolved with IV fluid ? ?Hypomagnesemia ?--IV replacement ordered, recheck Mg is 1.9  ? ?Sepsis from E coli bacteremia  ?--E coli now transitioned to IV Zosyn given deterioration in condition, worsening sepsis.   ?--lactate has normalized  ?--Suspect he has ascending cholangitis and transferring to [facility] for ERCP ?--follow up repeated blood cultures ? ?GERD (gastroesophageal reflux disease) ?--protonix for GI protection  ? ?Thrombocytopenia ([place]) ?-- no active bleeding, platelets down to 62 today ?-- pt may need a platelet transfusion prior to any procedures   ? ? ?DVT prophylaxis: SCDs ?Code Status: Full  ?Family Communication: spoke with son Jon Bennett multiple times on 4/15, 4/16, 4/17 ?Disposition: Status is: Inpatient ?Remains inpatient appropriate because: IV Fluid, IV antibiotics required.  ?  ?Consultants:  ?Surgery ?GI  ?Procedures:  ? ?Antimicrobials:  ?Cefepime 4/11> ?Vancomycin 4/11-4/12   ?Subjective: ?Pt says he has  no abdominal pain and he is tolerating his meals, he is having loose stools and lactulose was stopped.   ?Objective: ?Vitals:  ? 06/30/21 1105 06/30/21 1200 06/30/21 1500 06/30/21 1556  ?BP:  (!) 164/95 123/67   ?Pulse:  99 98 96  ?Resp:  20 (!) 24 20  ?Temp: 98.2 ?F (36.8 ?C)   98.9 ?F (37.2 ?C)  ?TempSrc: Oral   Oral  ?SpO2:  98% 96% 97%  ?Weight:      ?Height:      ? ? ?Intake/Output Summary (Last 24 hours) at 06/30/2021 1638 ?Last data filed at 06/30/2021 1200 ?Gross per 24 hour  ?Intake 1673.99 ml  ?Output 1700 ml  ?Net -26.01 ml  ? ?Filed Weights  ? 06/28/21 0500 06/29/21 0500 06/30/21 0500  ?Weight: 69.3 kg 71.1 kg 71.1 kg  ? ?Examination: ? ?General exam: pt less confused, he is awake, and able to answer questions, follows simple commands.  ?Respiratory system: shallow BS bilateral no rales heard. Respiratory effort normal. ?Cardiovascular system: normal S1 & S2 heard. No JVD, murmurs, rubs, gallops or clicks. No pedal edema. ?Gastrointestinal system: Abdomen is nondistended, soft and mild RUQ tenderness. No organomegaly or masses felt. Normal bowel sounds heard. ?Central nervous system: somnolent but arousable. No focal neurological deficits. ?Extremities: moving all extremities spontaneously.  ?Skin: bruising seen from around IV site, mild jaundice and scleral icterus seen.  ?Psychiatry: Judgement and insight appear UTD.  Mood & affect  appropriate.  ? ?Data Reviewed: I have personally reviewed following labs and imaging studies ? ?CBC: ?Recent Labs  ?Lab 06/26/21 ?0255 06/27/21 ?0422 06/28/21 ?0405 06/29/21 ?[id number] 06/30/21 ?[id number]  ?WBC 4.6 7.7 11.6* 7.5 5.2  ?NEUTROABS 3.3 6.7 9.8* 6.4 4.1  ?HGB 10.0* 11.4* 10.5* 9.6* 9.3*  ?HCT 30.4* 33.0* 31.8* 27.7* 26.0*  ?MCV 92.1 89.2 91.6 91.1 89.3  ?PLT 67* 83* 52* 62* 77*  ? ? ?Basic Metabolic Panel: ?Recent Labs  ?Lab 06/24/21 ?[id number] 06/25/21 ?[id number] 06/26/21 ?0255 06/27/21 ?0422 06/28/21 ?0405 06/29/21 ?[id number] 06/30/21 ?[id number]  ?NA 137 136 135 131* 132* 136 136  ?K 4.6 3.8  3.9 3.6 3.5 3.4* 3.4*  ?CL 106 102 101 98 104 108 109  ?CO2 20* 27 27 23  21* 22 21*  ?GLUCOSE 167* 108* 112* 143* 131* 106* 114*  ?BUN 13 12 11 8 8 12 10   ?CREATININE 1.29* 0.87 0.79 0.62 0.62 0.61 0.71  ?C

## 2021-06-30 NOTE — Progress Notes (Addendum)
? ? ?Subjective: ?Sitting up eating breakfast. Reports he is feeling better. Denies abdominal pain, nausea, or vomiting. He is complaining of diarrhea though he is receiving lactulose. Difficult to get a clear understanding of how much diarrhea he is having. Denies brbpr or melena.  ? ?Spoke with nurse. Flexiseal bag is full (1000 cc) which is the first full bag since yesterday. He also had some stool leak out around his flexiseal overnight. No brbpr, melena.  He has not had any nausea or vomiting. ? ?No overt GI bleeding.  ? ?Objective: ?Vital signs in last 24 hours: ?Temp:  [97.7 ?F (36.5 ?C)-98.3 ?F (36.8 ?C)] 98.2 ?F (36.8 ?C) (04/18 0700) ?Pulse Rate:  [48-105] 97 (04/18 0700) ?Resp:  [17-31] 25 (04/18 0700) ?BP: (91-164)/(45-96) 156/75 (04/18 0700) ?SpO2:  [96 %-99 %] 98 % (04/18 0700) ?Weight:  [71.1 kg] 71.1 kg (04/18 0500) ?Last BM Date : 06/29/21 ?General:   Alert and oriented, pleasant ?Head:  Normocephalic and atraumatic. ?Eyes:  No icterus, sclera clear. Conjuctiva pink.  ?Abdomen:  Bowel sounds present, soft, non-tender, non-distended. No HSM or hernias noted. No rebound or guarding. No masses appreciated  ?Msk:  Symmetrical without gross deformities. Normal posture. ?Extremities:  Without edema. ?Neurologic:  Alert and  oriented x4 ?Psych:  Normal mood and affect. ? ?Intake/Output from previous day: ?04/17 0701 - 04/18 0700 ?In: 1967.7 [P.O.:240; I.V.:1560; IV Piggyback:167.7] ?Out: 1250 [Urine:1250] ?Intake/Output this shift: ?No intake/output data recorded. ? ?Lab Results: ?Recent Labs  ?  06/28/21 ?0405 06/29/21 ?0331 06/30/21 ?0610  ?WBC 11.6* 7.5 5.2  ?HGB 10.5* 9.6* 9.3*  ?HCT 31.8* 27.7* 26.0*  ?PLT 52* 62* 77*  ? ?BMET ?Recent Labs  ?  06/28/21 ?0405 06/29/21 ?0331 06/30/21 ?0610  ?NA 132* 136 136  ?K 3.5 3.4* 3.4*  ?CL 104 108 109  ?CO2 21* 22 21*  ?GLUCOSE 131* 106* 114*  ?BUN _0 ?CREATININE 0.62 0.61 0.71  ?CALCIUM 7.7* 7.9* 7.8*  ? ?LFT ?Recent Labs  ?  06/28/21 ?0405 06/29/21 ?0331  06/30/21 ?0610  ?PROT 5.2* 5.1* 5.2*  ?ALBUMIN 2.3* 2.2* 2.2*  ?AST 60* 78* 50*  ?ALT 38 38 34  ?ALKPHOS 280* 269* 284*  ?BILITOT 5.5* 3.8* 2.6*  ? ?PT/INR ?Recent Labs  ?  06/28/21 ?1211  ?LABPROT 14.7  ?INR 1.2  ? ? ?Studies/Results: ?[place] EKG SITE RITE ? ?Result Date: 06/28/2021 ?If [employer] not attached, placement could not be confirmed due to current cardiac rhythm. ? ?US Abdomen Limited RUQ (LIVER/GB) ? ?Result Date: 06/29/2021 ?CLINICAL DATA:  History of nausea and vomiting. Cholangitis with sepsis. Hyperbilirubinemia. EXAM: ULTRASOUND ABDOMEN LIMITED RIGHT UPPER QUADRANT COMPARISON:  Ultrasound 06/24/2021 and CT abdomen 06/23/2021 FINDINGS: Gallbladder: Gallbladder is moderately distended with echogenic stones and echogenic sludge. Largest stone measures 1.5 cm. Borderline gallbladder wall thickening. Reportedly, the patient does not have a sonographic Murphy sign. Common bile duct: Diameter: 0.5 cm Liver: No focal lesion identified. Liver is slightly heterogeneous. Portal vein is patent on color Doppler imaging with normal direction of blood flow towards the liver. Other: None. IMPRESSION: Stable appearance of the gallbladder. Gallbladder is moderately distended with cholelithiasis and sludge. Normal caliber of the common bile duct. No intrahepatic biliary dilatation. Electronically Signed   By: Markus Daft M.D.   On: 06/29/2021 09:46   ? ?Assessment: ?87 y.o. male with a history of stroke in the past, splenic vein thrombosis in 2021, DM, HTN, alcohol use, GERD, who presented to the ED on 4/11 with  N/V, coffee-ground emesis, fever, elevated LFTs and CT abd/pelvis with contrast noting cholelithiasis, dilated CBD up to 8-9 mm without obvious stone. Blood cultures positive for E.coli on 4/11. Admitted with concern for ascending cholangitis with sepsis.  ? ?Coffee-ground emesis: ?EGD 4/13 with LA Grade B esophagitis, MW tear as source of bleeding, duodenitis, and non-bleeding diverticulum. H.pylori IgG  negative. Hgb down slightly to 9.3 today from 9.6 yesterday, but no overt GI bleeding. Admitting Hgb 13.2.  Currently on IV PPI BID.  ? ?Elevated LFTs:  ?Suspected to be secondary to choledocholithiasis/cholangitis in the setting of prominent CBD on CT and cholelithiasis. Bilirubin and transaminases had normalized on 4/15, but bumped again on 4/15 with worsening sepsis, fever, and mental status changes, started on Zosyn and lactulose as ammonia was also found to be elevated.  Repeat blood cultures on 4/15 have been negative thus far.  Due to difficult duodenal anatomy (duodenal diverticulum), Dr. Laural Golden recommended transfer to [facility] for [facility]CP.  Transfer orders are in place, but no bed as of yet.  Ultrasound abdomen yesterday with CBD 0.5 cm. Clinically, patient is improved, afebrile, mentating normally.  Bilirubin down to 2.6 today from a peak of 5.5, 2 days ago.  AST trending down, 50 today, ALT normal, alk phos stable at 284.  WBC normal.  ? ?Will continue to await for bed in [place].  Limited options here at [facility] as he is not a candidate for ERCP or cholecystectomy per general surgery. ? ?He is having quite a bit of diarrhea with current dose of lactulose, requiring flexiseal. Will decrease lactulose to once daily dosing.  ? ? ?Plan: ?Continue antibiotics.  ?Continue PPI BID  ?Continue supportive measures.  ?Continue to hold anticoagulation.  ?Decrease lactulose to once daily and monitor stool output and mental status.  ?Needs transfer to  [facility], awaiting bed.  ? ?Addendum: Dr. Jenetta Downer recommended discontinuing lactulose completely. ? ? ? LOS: 6 days  ? ? 06/30/2021, 9:04 AM ? ? ?Aliene Altes, PA-C ?[facility]  ?

## 2021-06-30 NOTE — Progress Notes (Signed)
Patient with new bed assignment for [facility] 5W- [id number].  ?[facility] called and arranged for transportation to [facility].  ?Handoff given to [place], EMT with [facility]. ?Patient's son [name] called at [phone]  and given an update that his father is transferring to [facility].  ? ?Report called via phone to receiving RN Verdon Cummins on 5W. [phone]. Handoff provided. ? ?[facility] at bedside to pick-up patient. Loaded on stretcher and left [facility] ICU in stable condition.  ? ?

## 2021-06-30 NOTE — Progress Notes (Signed)
Morning medications given at this time as patient was NPO previously.  ?Patient's son Mark/ [phone] called and updated. [name] also able to speak with his father/patient at this time. Per note/plan of care, still pending transfer to [facility] vs [facility] for GI procedure.  ?

## 2021-07-01 ENCOUNTER — Inpatient Hospital Stay (HOSPITAL_COMMUNITY): Payer: Medicare Other

## 2021-07-01 DIAGNOSIS — K922 Gastrointestinal hemorrhage, unspecified: Secondary | ICD-10-CM | POA: Diagnosis not present

## 2021-07-01 DIAGNOSIS — I81 Portal vein thrombosis: Secondary | ICD-10-CM

## 2021-07-01 DIAGNOSIS — R7401 Elevation of levels of liver transaminase levels: Secondary | ICD-10-CM | POA: Diagnosis not present

## 2021-07-01 DIAGNOSIS — K8001 Calculus of gallbladder with acute cholecystitis with obstruction: Secondary | ICD-10-CM | POA: Diagnosis not present

## 2021-07-01 DIAGNOSIS — R112 Nausea with vomiting, unspecified: Secondary | ICD-10-CM | POA: Diagnosis not present

## 2021-07-01 DIAGNOSIS — K8309 Other cholangitis: Secondary | ICD-10-CM | POA: Diagnosis not present

## 2021-07-01 HISTORY — DX: Portal vein thrombosis: I81

## 2021-07-01 LAB — MAGNESIUM: Magnesium: 1.7 mg/dL (ref 1.7–2.4)

## 2021-07-01 MED ORDER — HEPARIN SODIUM (PORCINE) 5000 UNIT/ML IJ SOLN
5000.0000 [IU] | Freq: Three times a day (TID) | INTRAMUSCULAR | Status: DC
  Administered 2021-07-01 – 2021-07-02 (×3): 5000 [IU] via SUBCUTANEOUS
  Filled 2021-07-01 (×3): qty 1

## 2021-07-01 MED ORDER — GADOBUTROL 1 MMOL/ML IV SOLN
7.5000 mL | Freq: Once | INTRAVENOUS | Status: AC | PRN
  Administered 2021-07-01: 7.5 mL via INTRAVENOUS

## 2021-07-01 MED ORDER — POTASSIUM CHLORIDE CRYS ER 20 MEQ PO TBCR
40.0000 meq | EXTENDED_RELEASE_TABLET | Freq: Once | ORAL | Status: AC
  Administered 2021-07-01: 40 meq via ORAL
  Filled 2021-07-01: qty 2

## 2021-07-01 NOTE — Progress Notes (Signed)
?PROGRESS NOTE ? ? ? ?Jon Bennett  AST:[id number] DOB: Jan 02, 1935 DOA: 06/23/2021 ?PCP: [name], MD  ? ? ?Chief Complaint  ?Patient presents with  ? Vomiting  ? ? ?Brief Narrative:  ? ?87 y.o. male with medical history significant of type 2 diabetes mellitus, hypertension, splenic vein thrombosis, alcohol withdrawal, GERD who presents to the emergency department due to several days of onset of nausea and vomiting and increased urinary frequency.  He denies any abdominal pain, chest pain, headache, blurry vision, shortness of breath.  Patient states that he has abstained from alcohol for several months. Patient was seen in the ED on 3/23 due to alcoholic gastritis, he was treated in the ED and discharged home.  Apparently, patient had a pint of vodka about 4 days prior to the ED visit.  ?  ?ED Course:  ?In the emergency department, patient was febrile with a temperature of 100.52F, he was tachycardic, tachypneic work-up in the ED showed leukopenia (WBC 3.5), platelets 109, BUN/creatinine 16/1.48 (baseline creatinine at 0.6-1.0), lactic acid 3.3 > 4.5 > 3.5, phosphorus 2.4, magnesium 1.5, gastric occult blood was positive.  CT abdomen and pelvis with contrast showed gallbladder distention with multiple stones, no inflammation. Slightly enlarged appearing common bile duct up to 8-9 mm which may be correlated with LFTs.  Chest x-ray showed no acute abnormality.  Patient was treated with IV cefepime, Flagyl and vancomycin.  He was treated with IV Zofran, IV hydration was provided, magnesium was replenished. ?  ?His blood cultures 4/11 positive for E coli. Pharmacy changed abx to ceftriaxone on 4/13.    ?  ?06/26/2021 : surgery planning for cholecystectomy in next couple of days. Narrowed antibiotics for E colic bacteremia to cefazolin starting on 4/14.   ?  ?06/27/2021: spike in bilirubin to 4.1 today.  GI planning MRCP on 4/17.  Timing of cholecystectomy to be determined after further testing.   Pt developed  worsening sepsis, spiked fever, ammonia elevated, WBC and procalcitonin rising. Transferred to stepdown ICU.  Spoke with son and DNR order confirmed.  Repeated blood cultures. Broadened antibiotics.   ?  ?06/28/2021:  Worsening encephalopathy today, platelets down to 52.  WBC and procalcitonin rising.  Conferenced with GI and surgery.  Recommending ERCP but GI reports he is too complex to have done at [facility].  Ordered for transfer to [facility] or [facility] per GI. Dr. Marletta Lor reaching out to GI team in [place].  I spoke with family if they wanted to proceed with ERCP.  I spoke with son [name] who is agreeable to ERCP understanding that there are significant risks involved.  He was agreeable to transfer to [facility].  ?  ?06/29/2021: pt still waiting for transfer to [facility] for ERCP.  Cancelled NPO order and allowed to eat.  Ammonia coming down.  I updated son [name] about situation with transfer. Pt clinically appears nontoxic.  Bilirubin remains elevated but slightly improved today.   ?  ?06/30/2021: bilirubin down to 2.6 today.  Encephalopathy improved and lactulose discontinued due to loose stools.   ? ? ?Assessment & Plan: ?  ?Principal Problem: ?  Intractable nausea and vomiting ?Active Problems: ?  Thrombocytopenia (H[facility]) ?  GERD (gastroesophageal reflux disease) ?  Sepsis from E coli bacteremia  ?  Hypomagnesemia ?  Lactic acidosis ?  Hypercalcemia ?  Transaminitis ?  GI bleed ?  AKI (acute kidney injury) (HCC) ?  Hypophosphatemia ?  Ascending cholangitis with severe sepsis ?  Chronic anticoagulation ?  Calculus of gallbladder with acute cholecystitis and obstruction ?  Hyperbilirubinemia ?  Hypokalemia ? ?Intractable nausea and vomiting ?--continue supportive measures  ?-- no emesis since admission  ?  ?Ascending cholangitis with severe sepsis ?--initially improved some but rapidly decompensating ?-Continue with IV Zosyn ?-Enzymes appears to be improving, [facility] showing no  biliary dilation or choledocholithiasis. ?-GI input greatly appreciated, plan for MRCP to evaluate for CBD stone to determine need for ERCP. ?- will need surgical consultation after MRCP. ?  ?Hypokalemia ?-- repleted  ?  ?Hyperbilirubinemia ?--spike in bilirubin but now down to 3.  GI recalled and recommending ERCP. Transferring to [facility] per GI recommendation for ERCP.  Dr. Marletta Lorarver discussed with his GI colleagues in [place] about ERCP.  ?  ?Calculus of gallbladder with acute cholecystitis and obstruction ?-Please see above discussion ?  ?Chronic anticoagulation ?--temporarily holding all anticoagulation for 72 hours per GI recommendations but also in anticipation of upcoming procedures and also concern about thrombocytopenia.   ?--monitor for bleeding complications ?-DVT prophylaxis meanwhile. ?  ?Hypophosphatemia ?--IV replacement ordered 4/12 and repleted.    ?  ?AKI (acute kidney injury) (HCC) ?--RESOLVED after hydrating with IV fluids ?  ?  ?GI bleed ?--appreciate GI consultation, EGD 4/13 with findings of small mallory weiss tear ?--continue protonix as ordered for GI protection ?  ?Transaminitis ?-- LFTs initially trended down with treatments but now rising with bilirubin, GI recalled and planning MRCP on 4/17.   ?--planning for lap chole with Dr. Lovell SheehanJenkins - timing to be determined  ?  ?Hypercalcemia ?--resolved after hydration with IV fluid ?  ?Lactic acidosis ?-- resolved with IV fluid ?  ?Hypomagnesemia ?--IV replacement ordered, recheck Mg is 1.9  ?  ?Sepsis from E coli bacteremia  ?--E coli now transitioned to IV Zosyn given deterioration in condition, worsening sepsis.   ?--lactate has normalized  ?--Suspect he has ascending cholangitis and transferring to [facility] for ERCP ?--follow up repeated blood cultures ?  ?GERD (gastroesophageal reflux disease) ?--protonix for GI protection  ?  ?Thrombocytopenia (HCC) ?-- no active bleeding, platelets down to 62 today ?-- pt may need a platelet transfusion prior  to any procedures   ?  ?  ? ? ? ?DVT prophylaxis: Heparin ?Code Status: DNR ?Family Communication: None at bedside ?Disposition:  ? ?Status is: Inpatient ? ?  ?Consultants:  ?GI  ?[place] ?[facility] ? ? ?Subjective: ? ?Patient reports generalized weakness, fatigue, and discomfort in right abdomen. ? ?Objective: ?Vitals:  ? 07/01/21 0815 07/01/21 0830 07/01/21 0845 07/01/21 1112  ?BP:   (!) 161/77 (!) 155/77  ?Pulse: (!) 116 (!) 110 (!) 104 (!) 103  ?Resp: (!) 24 19 18  (!) 24  ?Temp:   98.9 ?F (37.2 ?C) 98.1 ?F (36.7 ?C)  ?TempSrc:   Oral Oral  ?SpO2: 100% 96% 97% 94%  ?Weight:      ?Height:      ? ? ?Intake/Output Summary (Last 24 hours) at 07/01/2021 1522 ?Last data filed at 07/01/[phone] ?Gross per 24 hour  ?Intake 503.22 ml  ?Output 1450 ml  ?Net -946.78 ml  ? ?Filed Weights  ? 06/28/21 0500 06/29/21 0500 06/30/21 0500  ?Weight: 69.3 kg 71.1 kg 71.1 kg  ? ? ?Examination: ? ?Awake Alert, Oriented X 3, No new F.N deficits, Normal affect ?Symmetrical Chest wall movement, Good air movement bilaterally, CTAB ?RRR,No Gallops,Rubs or new Murmurs, No Parasternal Heave ?+ve B.Sounds, Abd Soft, mild right upper quadrant tenderness,  No rebound - guarding or rigidity. ?No Cyanosis, Clubbing or edema, No new Rash or bruise   ? ? ? ?Data Reviewed: I have personally reviewed following labs and imaging studies ? ?CBC: ?Recent Labs  ?Lab 06/26/21 ?0255 06/27/21 ?0422 06/28/21 ?0405 06/29/21 ?4081 06/30/21 ?4481  ?WBC 4.6 7.7 11.6* 7.5 5.2  ?NEUTROABS 3.3 6.7 9.8* 6.4 4.1  ?HGB 10.0* 11.4* 10.5* 9.6* 9.3*  ?HCT 30.4* 33.0* 31.8* 27.7* 26.0*  ?MCV 92.1 89.2 91.6 91.1 89.3  ?PLT 67* 83* 52* 62* 77*  ? ? ?Basic Metabolic Panel: ?Recent Labs  ?Lab 06/25/21 ?8563 06/26/21 ?0255 06/27/21 ?0422 06/28/21 ?0405 06/29/21 ?1497 06/30/21 ?0263  ?NA 136 135 131* 132* 136 136  ?K 3.8 3.9 3.6 3.5 3.4* 3.4*  ?CL 102 101 98 104 108 109  ?CO2 27 27 23  21* 22 21*  ?GLUCOSE 108* 112* 143* 131* 106* 114*  ?BUN 12 11 8 8 12 10    ?CREATININE 0.87 0.79 0.62 0.62 0.61 0.71  ?CALCIUM 8.4* 8.3* 8.4* 7.7* 7.9* 7.8*  ?MG 1.9 1.9 1.9  --  1.9 1.9  ? ? ?GFR: ?Estimated Creatinine Clearance: 65.4 mL/min (by C-G formula based on SCr of 0.71 mg/dL)

## 2021-07-01 NOTE — Consult Note (Signed)
? ?Referring Provider: TRH ?Primary Care Physician:  Mertha Baars, MD ?Primary Gastroenterologist:  Dr. Laural Golden ? ?Reason for Consultation:  Cholangitis ? ?HPI: Jon Bennett is a 87 y.o. male with history of stroke years ago (per son), type 2 diabetes, HTN, splenic vein thrombosis, alcohol use disorder (patient reports no ETOH in several months), GERD, who presented to the ED on 4/11 with chief complaint of nausea and vomiting x3 weeks as well as increased urinary frequency.  LFTs were elelvated and he was found to have blood cultures positive for E. coli and gallbladder was found to be full of stones so plan was for surgery.  He then spiked a fever with increasing white blood cell count and there was concern for cholangitis.  There was some altered mental status and platelet count decreased so it was decided that he needed to be transferred to [facility] for ERCP.  Labs/LFTs are actually trending down. ? ?Notably, he was previously seen in the emergency room on 3/23 after having 1/2 pint of Vodka 4 days prior and subsequently had developed vomiting since that time.  He was found to have elevated LFTs at that time with AST 120, ALT 79, alk phos 200, total bilirubin 6.1.  Lipase normal.  WBC count was elevated at 10.8.  No imaging pursued.  He was diagnosed with acute alcoholic gastritis and started on Protonix daily and Zofran as needed. ? ?Had CGE and underwent EGD as follows: ? ?EGD 06/25/2021: ? ?- Normal hypopharynx. ?- Normal proximal esophagus and mid esophagus. ?- LA Grade B reflux esophagitis with no bleeding. ?- Z-line regular, 30 cm from the incisors. ?- 10 cm hiatal hernia. ?- Mallory-Weiss tear. Mallory-Weiss tear located just below the GE junction along the lesser ?curvature. ?- Duodenitis. ?- Non-bleeding duodenal diverticulum. ?- No specimens collected. ? ?Multiple imaging studies including ultrasound 06/29/2021: ? ?IMPRESSION: ?Stable appearance of the gallbladder. Gallbladder is  moderately ?distended with cholelithiasis and sludge. ?  ?Normal caliber of the common bile duct. No intrahepatic biliary ?dilatation. ? ?CT scan of the abdomen and pelvis with contrast on 06/24/2021: ? ?IMPRESSION: ?1. Gallbladder distension with multiple stones but no inflammation. ?Slightly enlarged appearing common bile duct up to 8-9 mm which may ?be correlated with LFTs. ?2. Otherwise no CT evidence for acute intra-abdominal or pelvic ?abnormality. ? ?Today labs show AST of 15 down from 78, ALT 34, alk phos 284 down from 269, total bili 2.6 down from 3.8.  White blood cell count has normalized at 5.2.  Hemoglobin 9.3 g.  Platelets up to 77 from 62. ? ?The patient tells me that he has had no further vomiting.  Minimal discomfort in the right upper quadrant. No family was at bedside during my visit. ? ?Past Medical History:  ?Diagnosis Date  ? Diabetes mellitus without complication ([place])   ? Patient is not on any medication for this  ? GERD (gastroesophageal reflux disease)   ? Hypertension   ? Pancreatitis, alcoholic, acute   ? Splenic vein thrombosis   ? ? ?Past Surgical History:  ?Procedure Laterality Date  ? APPENDECTOMY    ? ESOPHAGOGASTRODUODENOSCOPY (EGD) WITH PROPOFOL N/A 06/25/2021  ? Procedure: ESOPHAGOGASTRODUODENOSCOPY (EGD) WITH PROPOFOL;  Surgeon: Rogene Houston, MD;  Location: [facility]ITE;  Service: Endoscopy;  Laterality: N/A;  ? VASCULAR SURGERY    ? ? ?Prior to Admission medications   ?Medication Sig Start Date End Date Taking? Authorizing Provider  ?acamprosate (CAMPRAL) 333 MG tablet Take 666 mg by mouth 3 (three)  times daily. 06/10/21  Yes [provider]  ?acetaminophen (TYLENOL) 500 MG tablet Take 500 mg by mouth daily as needed for mild pain or moderate pain.   Yes [provider]  ?busPIRone (BUSPAR) 10 MG tablet Take 1 tablet (10 mg total) by mouth 2 (two) times daily. 08/27/20  Yes Shah, Pratik D, DO  ?doxepin (SINEQUAN) 25 MG capsule Take 25 mg by mouth at bedtime.  06/04/19  Yes [provider]  ?ELIQUIS 5 MG TABS tablet TAKE 1 TABLET BY MOUTH TWICE A DAY 05/20/21  Yes Derek Jack, MD  ?famotidine (PEPCID) 20 MG tablet Take 1 tablet (20 mg total) by mouth at bedtime. 06/30/19  Yes Barton Dubois, MD  ?metoprolol tartrate (LOPRESSOR) 25 MG tablet Take 1 tablet (25 mg total) by mouth 2 (two) times daily. 08/18/18  Yes Tat, Shanon Brow, MD  ?pantoprazole (PROTONIX) 40 MG tablet Take 1 tablet (40 mg total) by mouth daily. 06/04/21  Yes Truddie Hidden, MD  ?sertraline (ZOLOFT) 100 MG tablet Take 100 mg by mouth daily. 08/27/19  Yes [provider]  ?tamsulosin (FLOMAX) 0.4 MG CAPS capsule Take 0.4 mg by mouth daily. 06/09/21  Yes [provider]  ?diclofenac Sodium (VOLTAREN) 1 % GEL Apply 2 g topically 4 (four) times daily. ?Patient not taking: Reported on 06/24/2021 02/03/21   [provider]  ?HYDROcodone-acetaminophen (NORCO/VICODIN) 5-325 MG tablet Take 2 tablets by mouth every 4 (four) hours as needed. ?Patient not taking: Reported on 06/24/2021 08/27/20   Heath Lark D, DO  ?ondansetron (ZOFRAN-ODT) 4 MG disintegrating tablet Take 1 tablet (4 mg total) by mouth every 8 (eight) hours as needed for nausea or vomiting. ?Patient not taking: Reported on 06/24/2021 06/04/21   Truddie Hidden, MD  ?thiamine 100 MG tablet Take 1 tablet (100 mg total) by mouth daily. ?Patient not taking: Reported on 06/24/2021 08/28/20   Heath Lark D, DO  ?vitamin B-12 (CYANOCOBALAMIN) 1000 MCG tablet Take 1 tablet (1,000 mcg total) by mouth daily. ?Patient not taking: Reported on 06/24/2021 06/30/19   Barton Dubois, MD  ?Vitamin D, Ergocalciferol, (DRISDOL) 1.25 MG (50000 UNIT) CAPS capsule Take 50,000 Units by mouth every 7 (seven) days. ?Patient not taking: Reported on 06/24/2021    [provider]  ? ? ?Current Facility-Administered Medications  ?Medication Dose Route Frequency Provider Last Rate Last Admin  ? 0.9 %  sodium chloride infusion   Intravenous  Continuous Wynetta Emery, Clanford L, MD 50 mL/hr at 06/30/21 1651 Infusion Verify at 06/30/21 1651  ? acetaminophen (TYLENOL) tablet 650 mg  650 mg Oral Q6H PRN Wynetta Emery, Clanford L, MD   650 mg at 06/30/21 0641  ? Chlorhexidine Gluconate Cloth 2 % PADS 6 each  6 each Topical Daily Murlean Iba, MD   6 each at 07/01/21 [phone]  ? diphenhydrAMINE (BENADRYL) injection 25 mg  25 mg Intravenous Q6H PRN Adefeso, Oladapo, DO   25 mg at 06/24/21 1534  ? folic acid (FOLVITE) tablet 1 mg  1 mg Oral Daily Johnson, Clanford L, MD   1 mg at 07/01/21 0835  ? lidocaine (LIDODERM) 5 % 1 patch  1 patch Transdermal Q24H Zierle-Ghosh, Asia B, DO   1 patch at 06/30/21 2116  ? MEDLINE mouth rinse  15 mL Mouth Rinse BID Johnson, Clanford L, MD   15 mL at 07/01/21 0837  ? melatonin tablet 6 mg  6 mg Oral QHS Zierle-Ghosh, Asia B, DO   6 mg at 06/30/21 2115  ? metoCLOPramide (REGLAN) injection  10 mg  10 mg Intravenous Q6H PRN Adefeso, Oladapo, DO   10 mg at 06/24/21 1536  ? multivitamin with minerals tablet 1 tablet  1 tablet Oral Daily Wynetta Emery, Clanford L, MD   1 tablet at 07/01/21 0835  ? oxyCODONE (Oxy IR/ROXICODONE) immediate release tablet 5 mg  5 mg Oral Q6H PRN Wynetta Emery, Clanford L, MD   5 mg at 07/01/21 0841  ? pantoprazole (PROTONIX) injection 40 mg  40 mg Intravenous Q12H Erenest Rasher, PA-C   40 mg at 07/01/21 1552  ? piperacillin-tazobactam (ZOSYN) IVPB 3.375 g  3.375 g Intravenous Q8H Aviva Signs, MD 12.5 mL/hr at 07/01/21 0518 3.375 g at 07/01/21 0518  ? prochlorperazine (COMPAZINE) tablet 10 mg  10 mg Oral Q6H PRN Adefeso, Oladapo, DO   10 mg at 06/26/21 2245  ? thiamine tablet 100 mg  100 mg Oral Daily Johnson, Clanford L, MD   100 mg at 07/01/21 0835  ? Or  ? thiamine (B-1) injection 100 mg  100 mg Intravenous Daily Johnson, Clanford L, MD   100 mg at 06/30/21 1055  ? ? ?Allergies as of 06/23/2021 - Review Complete 06/23/2021  ?Allergen Reaction Noted  ? Asa [aspirin] Shortness Of Breath 08/10/2018  ? ? ?Family History   ?Problem Relation Age of Onset  ? Hypertension Other   ? Heart disease Father   ? Healthy Sister   ? Healthy Brother   ? Cancer Maternal Grandfather   ? Healthy Brother   ? Healthy Brother   ? Healthy Sister   ? Healthy

## 2021-07-02 ENCOUNTER — Inpatient Hospital Stay (HOSPITAL_COMMUNITY): Payer: Medicare Other

## 2021-07-02 DIAGNOSIS — D696 Thrombocytopenia, unspecified: Secondary | ICD-10-CM | POA: Diagnosis not present

## 2021-07-02 DIAGNOSIS — R748 Abnormal levels of other serum enzymes: Secondary | ICD-10-CM | POA: Diagnosis not present

## 2021-07-02 DIAGNOSIS — K922 Gastrointestinal hemorrhage, unspecified: Secondary | ICD-10-CM | POA: Diagnosis not present

## 2021-07-02 DIAGNOSIS — K8050 Calculus of bile duct without cholangitis or cholecystitis without obstruction: Secondary | ICD-10-CM

## 2021-07-02 DIAGNOSIS — K8309 Other cholangitis: Secondary | ICD-10-CM | POA: Diagnosis not present

## 2021-07-02 DIAGNOSIS — K8001 Calculus of gallbladder with acute cholecystitis with obstruction: Secondary | ICD-10-CM | POA: Diagnosis not present

## 2021-07-02 LAB — CULTURE, BLOOD (ROUTINE X 2)
Culture: NO GROWTH
Culture: NO GROWTH
Special Requests: ADEQUATE
Special Requests: ADEQUATE

## 2021-07-02 LAB — CBC
HCT: 26.4 % — ABNORMAL LOW (ref 39.0–52.0)
Hemoglobin: 8.8 g/dL — ABNORMAL LOW (ref 13.0–17.0)
MCH: 30.7 pg (ref 26.0–34.0)
MCHC: 33.3 g/dL (ref 30.0–36.0)
MCV: 92 fL (ref 80.0–100.0)
Platelets: 117 10*3/uL — ABNORMAL LOW (ref 150–400)
RBC: 2.87 MIL/uL — ABNORMAL LOW (ref 4.22–5.81)
RDW: 14.7 % (ref 11.5–15.5)
WBC: 8.1 10*3/uL (ref 4.0–10.5)
nRBC: 0 % (ref 0.0–0.2)

## 2021-07-02 LAB — COMPREHENSIVE METABOLIC PANEL
ALT: 18 U/L (ref 0–44)
AST: 18 U/L (ref 15–41)
Albumin: 2 g/dL — ABNORMAL LOW (ref 3.5–5.0)
Alkaline Phosphatase: 195 U/L — ABNORMAL HIGH (ref 38–126)
Anion gap: 9 (ref 5–15)
BUN: 6 mg/dL — ABNORMAL LOW (ref 8–23)
CO2: 20 mmol/L — ABNORMAL LOW (ref 22–32)
Calcium: 7.9 mg/dL — ABNORMAL LOW (ref 8.9–10.3)
Chloride: 99 mmol/L (ref 98–111)
Creatinine, Ser: 0.77 mg/dL (ref 0.61–1.24)
GFR, Estimated: >60 mL/min (ref >60)
Glucose, Bld: 112 mg/dL — ABNORMAL HIGH (ref 70–99)
Potassium: 3.5 mmol/L (ref 3.5–5.1)
Sodium: 128 mmol/L — ABNORMAL LOW (ref 135–145)
Total Bilirubin: 2.3 mg/dL — ABNORMAL HIGH (ref 0.3–1.2)
Total Protein: 5.1 g/dL — ABNORMAL LOW (ref 6.5–8.1)

## 2021-07-02 MED ORDER — SODIUM CHLORIDE 0.9% FLUSH: 10.0000 mL | INTRAVENOUS | Status: DC | PRN

## 2021-07-02 MED ORDER — DICLOFENAC SUPPOSITORY 100 MG
100.0000 mg | Freq: Once | RECTAL | Status: DC
Start: 2021-07-02 — End: 2021-07-06
  Filled 2021-07-02: qty 1

## 2021-07-02 MED ORDER — ENSURE ENLIVE PO LIQD
237.0000 mL | Freq: Three times a day (TID) | ORAL | Status: DC
  Administered 2021-07-02 – 2021-07-07 (×10): 237 mL via ORAL

## 2021-07-02 MED ORDER — LACTATED RINGERS IV SOLN: INTRAVENOUS | Status: DC

## 2021-07-02 MED ORDER — HEPARIN SODIUM (PORCINE) 5000 UNIT/ML IJ SOLN
5000.0000 [IU] | Freq: Three times a day (TID) | INTRAMUSCULAR | Status: AC
  Administered 2021-07-02 (×2): 5000 [IU] via SUBCUTANEOUS
  Filled 2021-07-02 (×2): qty 1

## 2021-07-02 MED ORDER — CHLORHEXIDINE GLUCONATE CLOTH 2 % EX PADS: 6.0000 | MEDICATED_PAD | Freq: Once | CUTANEOUS | Status: AC

## 2021-07-02 MED ORDER — CHLORHEXIDINE GLUCONATE CLOTH 2 % EX PADS
6.0000 | MEDICATED_PAD | Freq: Once | CUTANEOUS | Status: AC
  Administered 2021-07-02: 6 via TOPICAL

## 2021-07-02 NOTE — H&P (View-Only) (Signed)
? ? ? ?[facility] Progress Note ? ?CC:  CBD stones ? ?Subjective:  MRCP showed CBD stones.  Patient denies pain.  Seems to understand and be clearer more today. ? ? ?Objective:  ?Vital signs in last 24 hours: ?Temp:  [98.1 ?F (36.7 ?C)-99 ?F (37.2 ?C)] 98.8 ?F (37.1 ?C) (04/20 0750) ?Pulse Rate:  [94-103] 95 (04/20 0750) ?Resp:  [20-31] 20 (04/20 0750) ?BP: (139-158)/(71-86) 142/71 (04/20 0750) ?SpO2:  [94 %-97 %] 96 % (04/20 0750) ?Last BM Date : 06/30/21 ?General:  Alert, elderly, in NAD ?Heart:  Regular rate and rhythm; no murmurs ?Pulm:  CTAB.  No W/R/R. ?Abdomen:  Soft, non-distended.  BS present.  Non-tender. ?Extremities:  Without edema. ? ?Intake/Output from previous day: ?04/19 0701 - 04/20 0700 ?In: 52.6 [I.V.:2.6; IV Piggyback:50] ?Out: [id number] [Urine:1450; [id number] ? ?Lab Results: ?Recent Labs  ?  06/30/21 ?0610 07/02/21 ?[id number]  ?WBC 5.2 8.1  ?HGB 9.3* 8.8*  ?HCT 26.0* 26.4*  ?PLT 77* 117*  ? ?BMET ?Recent Labs  ?  06/30/21 ?0610 07/02/21 ?0633  ?NA 136 128*  ?K 3.4* 3.5  ?CL 109 99  ?CO2 21* 20*  ?GLUCOSE 114* 112*  ?BUN 10 6*  ?CREATININE 0.71 0.77  ?CALCIUM 7.8* 7.9*  ? ?LFT ?Recent Labs  ?  07/02/21 ?0633  ?PROT 5.1*  ?ALBUMIN 2.0*  ?AST 18  ?ALT 18  ?ALKPHOS 195*  ?BILITOT 2.3*  ? ?MR ABDOMEN MRCP W WO CONTAST ? ?Result Date: 07/01/2021 ?CLINICAL DATA:  Nausea and vomiting. Jaundice. Elevated liver function tests. Biliary duct dilatation. History of diabetes. Alcohol withdrawal. Splenic vein thrombosis. Gastroesophageal reflux disease. EXAM: MRI ABDOMEN WITHOUT AND WITH CONTRAST (INCLUDING MRCP) TECHNIQUE: Multiplanar multisequence MR imaging of the abdomen was performed both before and after the administration of intravenous contrast. Heavily T2-weighted images of the biliary and pancreatic ducts were obtained, and three-dimensional MRCP images were rendered by post processing. CONTRAST:  7.25mL GADAVIST GADOBUTROL 1 MMOL/ML IV SOLN COMPARISON:  Right upper quadrant ultrasound  06/29/2021. CT 06/24/2021. FINDINGS: Portions of exam are moderately motion degraded. Lower chest: Moderate hiatal hernia. Trace, right larger than left pleural effusions. Normal heart size. Hepatobiliary: No cirrhosis or focal liver lesion. Multiple gallstones including up to 8 mm. No specific evidence of acute cholecystitis. Borderline to mild gallbladder distension at 9.3 cm. No intrahepatic biliary duct dilatation. Although the common duct is normal in caliber for age at 7 mm on 25/3,a distal common duct stone measures on the order of 7 mm, including on 22/4, 23/3. Suspect smaller dependent stones within the more proximal common duct including on 18/4 and 82/6. Of note, MRCP images are significantly motion degraded. Pancreas:  Markedly atrophic. Spleen:  Normal in size, without focal abnormality. Adrenals/Urinary Tract: Normal adrenal glands. Mild renal cortical thinning bilaterally. Bilateral small renal cysts, without suspicious renal lesion or hydronephrosis. Stomach/Bowel: Normal distal most stomach and abdominal bowel loops. Vascular/Lymphatic: Aortic atherosclerosis. Interval development of left portal vein thrombus, including on 45/1102. Concurrent edema including on 12/05. The splenic vein is insufficient and likely chronically thrombosed with resultant collaterals in a gastroepiploic distribution. No abdominal adenopathy. Other:  No ascites. Musculoskeletal: No acute osseous abnormality. IMPRESSION: 1. Moderately motion degraded exam. 2. Choledocholithiasis. Distal common duct stone of approximately 7 mm. Possible smaller more proximal common duct stones. No significant biliary duct dilatation. 3. Development of left portal vein thrombosis since 06/23/2021 CT. 4. Cholelithiasis without acute cholecystitis. Borderline to mild gallbladder distension is chronic. 5. Pancreatic atrophy, likely related to prior episodes of  pancreatitis. 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  These results will be called to the ordering clinician or representative by the Radiologist Assistant, and communication documented in the PACS or [employer]. Electronically Signed   By: Abigail Miyamoto M.D.   On: 07/01/2021 18:48   ? ?Assessment / Plan: ?*87 year old male who presented to [facility] with complaints of nausea and vomiting on 4/11.  Found to have blood cultures positive for E. coli and gallbladder was found to be full of stones so plan was for surgery.  He then spiked a fever with increasing white blood cell count and there was concern for cholangitis.  There was some altered mental status and platelet count decreased so it was decided that he needed to be transferred to [facility] for ERCP.   MRCP did show choledocholithiasis with a distal common duct stone approximately 7 mm and possibly smaller more proximal common duct stones without significant biliary dilatation.  LFTs continue to trend down. ?*History of splenic vein thrombosis: Was on Eliquis for this at home, but this has been on hold for procedures and surgery.  It actually does not look like he has received it at all since his admission 8 days ago. ?*New left portal vein thrombosis seen on MRCP ?*History of alcohol use/abuse:  No cirrhosis by imaging.  INR okay.  Platelets really just down this admission so likely due to sepsis liver disease.  One-time ammonia elevation also possibly due to to sepsis. ?*Hyponatremia:  Na+ 128 today. ? ?-Planning for ERCP, but not able to be done until 4/21 at 3 pm. ?-Continue abx, on Zosyn. ?-Trend labs. ?-Will allow him to eat today then NPO after midnight again. ? ? LOS: 8 days  ? ?Laban Emperor. Lars Jeziorski  07/02/2021, 8:53 AM ? ?  ?

## 2021-07-02 NOTE — Progress Notes (Addendum)
VAST RN following up previous note left by VAST RN on night shift 4/19; "PICC line from [facility] but with no tip confimation noted in the patient record. Recommend PCXR to confirm PICC tip for continued use. Primary RN notified". This RN could not find where a chest x-ray was ordered today. Contacted Elgergawy, MD via SecureChat to request order for chest x-ray to confirm PICC tip placement. Elgergawy, MD confirmed he would order chest x-ray. ?

## 2021-07-02 NOTE — Consult Note (Signed)
? ?  [facility] CM Inpatient Consult ? ? ?07/02/2021 ? ?Jon Bennett ?21-Sep-1934 ?[id number] ? ?[facility] Customer service manager [THN]  Accountable Care Organization [ACO] Patient: [name] ? ?Primary Care Provider:  [name], MD, showing as an [facility], which is not a [facility] provider ? ?Patient screened for hospitalization with noted transfer from [facility].  Review of patient's medical record reveals patient is at [facility] for ongoing testing. ? ? ?Plan:  Follow up on to check if PCP is up-to-date. ? ?For questions contact:  ? ?Charlesetta Shanks, RN BSN CCM ?[facility] [employer] Liaison ? [phone] business mobile phone ?Toll free office [phone]  ?Fax number: [phone] ?[email or web address] ?[email or web address]  ? ? ? ?

## 2021-07-02 NOTE — Evaluation (Signed)
Physical Therapy Evaluation ?Patient Details ?Name: Jon Bennett ?MRN: [id number] ?DOB: [birth year] ?Today's Date: 07/02/2021 ? ?History of Present Illness ? 87 y.o. male who presents to the emergency department due to several days of onset of nausea and vomiting and increased urinary frequency. +ascending cholangitis; transfer to [facility] from [facility] 4/19; +encephalopathy;  PMH significant of type 2 diabetes mellitus, hypertension, splenic vein thrombosis, alcohol withdrawal, GERD  ?Clinical Impression ?  ?Pt admitted secondary to problem above with deficits below. Patient mildly confused and unable to reach son by phone to obtain patient's prior functional status and home set-up. Pt currently unable to tolerate moving to sit EOB due to extreme pain in left knee (+edema and warmth).  Attempted two methods with each too painful. Currently recommending SNF, however this may change if left knee pain decreases and pt is able to mobilize and has near 24 hour family assistance. Anticipate patient will benefit from PT to address problems listed below.Will continue to follow acutely to maximize functional mobility independence and safety.   ?   ?   ? ?Recommendations for follow up therapy are one component of a multi-disciplinary discharge planning process, led by the attending physician.  Recommendations may be updated based on patient status, additional functional criteria and insurance authorization. ? ?Follow Up Recommendations Skilled nursing-short term rehab (<3 hours/day) ? ?  ?Assistance Recommended at Discharge Frequent or constant Supervision/Assistance  ?Patient can return home with the following ? Two people to help with walking and/or transfers;Two people to help with bathing/dressing/bathroom;Assistance with cooking/housework;Direct supervision/assist for medications management;Direct supervision/assist for financial management;Assist for transportation;Help with stairs or ramp for entrance ? ?  ?Equipment  Recommendations Other (comment) (TBD)  ?Recommendations for Other Services ? OT consult  ?  ?Functional Status Assessment Patient has had a recent decline in their functional status and demonstrates the ability to make significant improvements in function in a reasonable and predictable amount of time.  ? ?  ?Precautions / Restrictions Precautions ?Precautions: Fall;Other (comment) ?Precaution Comments: painful, swollen left knee ?Restrictions ?Weight Bearing Restrictions: No  ? ?  ? ?Mobility ? Bed Mobility ?Overal bed mobility: Needs Assistance ?Bed Mobility: Rolling ?Rolling: Max assist ?  ?  ?  ?  ?General bed mobility comments: only tolerated ~1/2 roll to right with pt using rail (limited by left knee pain); attempted supine to sit with HOB elevated and pt could not tolerate moving LLE toward/over EOB; scoot to HOB +2 total assist ?  ? ?Transfers ?  ?  ?  ?  ?  ?  ?  ?  ?  ?General transfer comment: unable due to left knee pain and could not get to sitting EOB ?  ? ?Ambulation/Gait ?  ?  ?  ?  ?  ?  ?  ?  ? ?Stairs ?  ?  ?  ?  ?  ? ?Wheelchair Mobility ?  ? ?Modified Rankin (Stroke Patients Only) ?  ? ?  ? ?Balance   ?  ?  ?  ?  ?  ?  ?  ?  ?  ?  ?  ?  ?  ?  ?  ?  ?  ?  ?   ? ? ? ?Pertinent Vitals/Pain Pain Assessment ?Pain Assessment: Faces ?Faces Pain Scale: Hurts whole lot ?Pain Location: left knee ?Pain Descriptors / Indicators: Discomfort, Grimacing, Guarding ?Pain Intervention(s): Limited activity within patient's tolerance, Monitored during session, Repositioned  ? ? ?Home Living Family/patient expects to be discharged to::  Private residence ?Living Arrangements: Children (son) ?  ?  ?  ?  ?  ?  ?  ?  ?Additional Comments: pt confused and son not answering phone  ?  ?Prior Function   ?  ?  ?  ?  ?  ?  ?  ?ADLs Comments: unsure; pt confused ?  ? ? ?Hand Dominance  ?   ? ?  ?Extremity/Trunk Assessment  ? Upper Extremity Assessment ?Upper Extremity Assessment: Defer to OT evaluation ?  ? ?Lower Extremity  Assessment ?Lower Extremity Assessment: RLE deficits/detail;LLE deficits/detail ?RLE Deficits / Details: hip/knee flexion 45 degrees (supine) and resists further due to rectal pain; grossly 4/5 ?LLE Deficits / Details: left knee swollen and warm to touch; pt doesn't tolerate flexion or even moving leg while keeping knee straight ?LLE: Unable to fully assess due to pain ?  ? ?Cervical / Trunk Assessment ?Cervical / Trunk Assessment: Kyphotic  ?Communication  ? Communication: No difficulties  ?Cognition Arousal/Alertness: Awake/alert ?Behavior During Therapy: [facility] for tasks assessed/performed ?Overall Cognitive Status: No family/caregiver present to determine baseline cognitive functioning ?  ?  ?  ?  ?  ?  ?  ?  ?  ?  ?  ?  ?  ?  ?  ?  ?General Comments: pt not oriented to location and could not recall after 2 minutes; otherwise NT ?  ?  ? ?  ?General Comments   ? ?  ?Exercises General Exercises - Lower Extremity ?Heel Slides: AROM, Right, 5 reps, Supine  ? ?Assessment/Plan  ?  ?PT Assessment Patient needs continued PT services  ?PT Problem List Decreased strength;Decreased range of motion;Decreased activity tolerance;Decreased balance;Decreased mobility;Decreased cognition;Decreased knowledge of use of DME;Decreased safety awareness;Pain ? ?   ?  ?PT Treatment Interventions DME instruction;Gait training;Stair training;Functional mobility training;Therapeutic activities;Therapeutic exercise;Balance training;Cognitive remediation;Patient/family education   ? ?PT Goals (Current goals can be found in the Care Plan section)  ?Acute Rehab PT Goals ?Patient Stated Goal: unable to state ?PT Goal Formulation: Patient unable to participate in goal setting ?Time For Goal Achievement: 07/16/21 ?Potential to Achieve Goals: Fair ? ?  ?Frequency Min 3X/week (until SNF confirmed) ?  ? ? ?Co-evaluation   ?  ?  ?  ?  ? ? ?  ?AM-PAC PT "6 Clicks" Mobility  ?Outcome Measure Help needed turning from your back to your side while in a flat  bed without using bedrails?: Total ?Help needed moving from lying on your back to sitting on the side of a flat bed without using bedrails?: Total ?Help needed moving to and from a bed to a chair (including a wheelchair)?: Total ?Help needed standing up from a chair using your arms (e.g., wheelchair or bedside chair)?: Total ?Help needed to walk in hospital room?: Total ?Help needed climbing 3-5 steps with a railing? : Total ?6 Click Score: 6 ? ?  ?End of Session   ?Activity Tolerance: Patient limited by pain ?Patient left: in bed;with call bell/phone within reach;with bed alarm set ?Nurse Communication: Mobility status;Need for lift equipment;Other (comment) (warm, swollen left knee) ?PT Visit Diagnosis: Muscle weakness (generalized) (M62.81);Pain ?Pain - Right/Left: Left ?Pain - part of body: Knee ?  ? ?Time: [id number] ?PT Time Calculation (min) (ACUTE ONLY): 28 min ? ? ?Charges:   PT Evaluation ?$PT Eval Low Complexity: 1 Low ?PT Treatments ?$Therapeutic Activity: 8-22 mins ?  ?   ? ? ? ?Arby Barrette, PT ?Acute Rehabilitation Services  ?Pager [phone] ?Office [phone] ? ? ?Jeani Hawking P  Tiago Humphrey ?07/02/2021, 4:20 PM ? ?

## 2021-07-02 NOTE — Progress Notes (Signed)
? ? ? ?[facility] Progress Note ? ?CC:  CBD stones ? ?Subjective:  MRCP showed CBD stones.  Patient denies pain.  Seems to understand and be clearer more today. ? ? ?Objective:  ?Vital signs in last 24 hours: ?Temp:  [98.1 ?F (36.7 ?C)-99 ?F (37.2 ?C)] 98.8 ?F (37.1 ?C) (04/20 0750) ?Pulse Rate:  [94-103] 95 (04/20 0750) ?Resp:  [20-31] 20 (04/20 0750) ?BP: (139-158)/(71-86) 142/71 (04/20 0750) ?SpO2:  [94 %-97 %] 96 % (04/20 0750) ?Last BM Date : 06/30/21 ?General:  Alert, elderly, in NAD ?Heart:  Regular rate and rhythm; no murmurs ?Pulm:  CTAB.  No W/R/R. ?Abdomen:  Soft, non-distended.  BS present.  Non-tender. ?Extremities:  Without edema. ? ?Intake/Output from previous day: ?04/19 0701 - 04/20 0700 ?In: 52.6 [I.V.:2.6; IV Piggyback:50] ?Out: 1750 [Urine:1450; Stool:300] ? ?Lab Results: ?Recent Labs  ?  06/30/21 ?0610 07/02/21 ?0633  ?WBC 5.2 8.1  ?HGB 9.3* 8.8*  ?HCT 26.0* 26.4*  ?PLT 77* 117*  ? ?BMET ?Recent Labs  ?  06/30/21 ?0610 07/02/21 ?0633  ?NA 136 128*  ?K 3.4* 3.5  ?CL 109 99  ?CO2 21* 20*  ?GLUCOSE 114* 112*  ?BUN 10 6*  ?CREATININE 0.71 0.77  ?CALCIUM 7.8* 7.9*  ? ?LFT ?Recent Labs  ?  07/02/21 ?0633  ?PROT 5.1*  ?ALBUMIN 2.0*  ?AST 18  ?ALT 18  ?ALKPHOS 195*  ?BILITOT 2.3*  ? ?MR ABDOMEN MRCP W WO CONTAST ? ?Result Date: 07/01/2021 ?CLINICAL DATA:  Nausea and vomiting. Jaundice. Elevated liver function tests. Biliary duct dilatation. History of diabetes. Alcohol withdrawal. Splenic vein thrombosis. Gastroesophageal reflux disease. EXAM: MRI ABDOMEN WITHOUT AND WITH CONTRAST (INCLUDING MRCP) TECHNIQUE: Multiplanar multisequence MR imaging of the abdomen was performed both before and after the administration of intravenous contrast. Heavily T2-weighted images of the biliary and pancreatic ducts were obtained, and three-dimensional MRCP images were rendered by post processing. CONTRAST:  7.5mL GADAVIST GADOBUTROL 1 MMOL/ML IV SOLN COMPARISON:  Right upper quadrant ultrasound  06/29/2021. CT 06/24/2021. FINDINGS: Portions of exam are moderately motion degraded. Lower chest: Moderate hiatal hernia. Trace, right larger than left pleural effusions. Normal heart size. Hepatobiliary: No cirrhosis or focal liver lesion. Multiple gallstones including up to 8 mm. No specific evidence of acute cholecystitis. Borderline to mild gallbladder distension at 9.3 cm. No intrahepatic biliary duct dilatation. Although the common duct is normal in caliber for age at 7 mm on 25/3,a distal common duct stone measures on the order of 7 mm, including on 22/4, 23/3. Suspect smaller dependent stones within the more proximal common duct including on 18/4 and 82/6. Of note, MRCP images are significantly motion degraded. Pancreas:  Markedly atrophic. Spleen:  Normal in size, without focal abnormality. Adrenals/Urinary Tract: Normal adrenal glands. Mild renal cortical thinning bilaterally. Bilateral small renal cysts, without suspicious renal lesion or hydronephrosis. Stomach/Bowel: Normal distal most stomach and abdominal bowel loops. Vascular/Lymphatic: Aortic atherosclerosis. Interval development of left portal vein thrombus, including on 45/1102. Concurrent edema including on 12/05. The splenic vein is insufficient and likely chronically thrombosed with resultant collaterals in a gastroepiploic distribution. No abdominal adenopathy. Other:  No ascites. Musculoskeletal: No acute osseous abnormality. IMPRESSION: 1. Moderately motion degraded exam. 2. Choledocholithiasis. Distal common duct stone of approximately 7 mm. Possible smaller more proximal common duct stones. No significant biliary duct dilatation. 3. Development of left portal vein thrombosis since 06/23/2021 CT. 4. Cholelithiasis without acute cholecystitis. Borderline to mild gallbladder distension is chronic. 5. Pancreatic atrophy, likely related to prior episodes of   pancreatitis. 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  These results will be called to the ordering clinician or representative by the Radiologist Assistant, and communication documented in the PACS or [facility] Dashboard. Electronically Signed   By: Kyle  Talbot M.D.   On: 07/01/2021 18:48   ? ?Assessment / Plan: ?*87-year-old male who presented to  Hospital with complaints of nausea and vomiting on 4/11.  Found to have blood cultures positive for E. coli and gallbladder was found to be full of stones so plan was for surgery.  He then spiked a fever with increasing white blood cell count and there was concern for cholangitis.  There was some altered mental status and platelet count decreased so it was decided that he needed to be transferred to [place] for ERCP.   MRCP did show choledocholithiasis with a distal common duct stone approximately 7 mm and possibly smaller more proximal common duct stones without significant biliary dilatation.  LFTs continue to trend down. ?*History of splenic vein thrombosis: Was on Eliquis for this at home, but this has been on hold for procedures and surgery.  It actually does not look like he has received it at all since his admission 8 days ago. ?*New left portal vein thrombosis seen on MRCP ?*History of alcohol use/abuse:  No cirrhosis by imaging.  INR okay.  Platelets really just down this admission so likely due to sepsis liver disease.  One-time ammonia elevation also possibly due to to sepsis. ?*Hyponatremia:  Na+ 128 today. ? ?-Planning for ERCP, but not able to be done until 4/21 at 3 pm. ?-Continue abx, on Zosyn. ?-Trend labs. ?-Will allow him to eat today then NPO after midnight again. ? ? LOS: 8 days  ? ?Pattricia Weiher D. Corie Allis  07/02/2021, 8:53 AM ? ?  ?

## 2021-07-02 NOTE — Progress Notes (Signed)
Consult for DBIV from PICC line. PICC line from [facility] but with no tip confimation noted in the patient record. Recommend PCXR to confirm PICC tip for continued use. Primary RN notified.  ?

## 2021-07-02 NOTE — Progress Notes (Addendum)
?PROGRESS NOTE ? ? ? ?Jon Bennett  UGQ:[id number] DOB: 01-07-1935 DOA: 06/23/2021 ?PCP: [name], MD  ? ? ?Chief Complaint  ?Patient presents with  ? Vomiting  ? ? ?Brief Narrative:  ? ?87 y.o. male with medical history significant of type 2 diabetes mellitus, hypertension, splenic vein thrombosis, alcohol withdrawal, GERD who presents to the emergency department due to several days of onset of nausea and vomiting and increased urinary frequency.  He denies any abdominal pain, chest pain, headache, blurry vision, shortness of breath.  Patient states that he has abstained from alcohol for several months. Patient was seen in the ED on 3/23 due to alcoholic gastritis, he was treated in the ED and discharged home.  Apparently, patient had a pint of vodka about 4 days prior to the ED visit.  ?-He was noted to be febrile, tachypneic and tachycardic, work-up significant for ascending cholangitis, was admitted to [facility], but he was transferred to [facility] 07/01/2021 for further management per GI. ?    ?His blood cultures 4/11 positive for E coli. Pharmacy changed abx to ceftriaxone on 4/13.    ?  ?06/26/2021 : surgery planning for cholecystectomy in next couple of days. Narrowed antibiotics for E colic bacteremia to cefazolin starting on 4/14.   ?  ?06/27/2021: spike in bilirubin to 4.1 today.  GI planning MRCP on 4/17.  Timing of cholecystectomy to be determined after further testing.   Pt developed worsening sepsis, spiked fever, ammonia elevated, WBC and procalcitonin rising. Transferred to stepdown ICU.  Spoke with son and DNR order confirmed.  Repeated blood cultures. Broadened antibiotics.   ?  ?06/28/2021:  Worsening encephalopathy today, platelets down to 52.  WBC and procalcitonin rising.  Conferenced with GI and surgery.  Recommending ERCP but GI reports he is too complex to have done at [facility].  Ordered for transfer to [facility] or [facility] per GI. Dr. Marletta Lor reaching out to GI team in  [place].  I spoke with family if they wanted to proceed with ERCP.  I spoke with son [name] who is agreeable to ERCP understanding that there are significant risks involved.  He was agreeable to transfer to [facility].  ?  ?06/29/2021: pt still waiting for transfer to [facility] for ERCP.  Cancelled NPO order and allowed to eat.  Ammonia coming down.  I updated son [name] about situation with transfer. Pt clinically appears nontoxic.  Bilirubin remains elevated but slightly improved today.   ?  ?06/30/2021: bilirubin down to 2.6 today.  Encephalopathy improved and lactulose discontinued due to loose stools.   ? ?07/02/2021: MRCP significant for cholelithiasis, plan for ERCP tomorrow. ? ? ?Assessment & Plan: ?  ?Principal Problem: ?  Intractable nausea and vomiting ?Active Problems: ?  Thrombocytopenia (HCC) ?  GERD (gastroesophageal reflux disease) ?  Sepsis from E coli bacteremia  ?  Hypomagnesemia ?  Lactic acidosis ?  Hypercalcemia ?  Transaminitis ?  GI bleed ?  AKI (acute kidney injury) (HCC) ?  Hypophosphatemia ?  Ascending cholangitis with severe sepsis ?  Chronic anticoagulation ?  Calculus of gallbladder with acute cholecystitis and obstruction ?  Hyperbilirubinemia ?  Hypokalemia ? ? ?Ascending cholangitis with severe sepsis ?-Enzymes appears to be improving, ultrasound prior to transfer from [facility] showing no biliary dilation or choledocholithiasis. ?-Continue with IV Zosyn ?- MRCP did show choledocholithiasis with a distal common duct stone approximately 7 mm and possibly smaller more proximal common duct stones without significant biliary dilatation.  LFTs continue to trend down. ?-For ERCP tomorrow. ?- will need surgical consultation after ERCP. ?  ?Calculus of gallbladder with acute cholecystitis and obstruction ?-Please see above discussion ? ?Sepsis from E coli bacteremia  ?-Due to above, continue with IV Zosyn. ? ?Intractable nausea and vomiting ?- due to above, resolved ?  ?Hypokalemia ?--  repleted  ?  ?Hyperbilirubinemia ?-Due to above, trending down ?  ?History of splenic vein thrombosis ?-Coagulation which has been held since admission anticipation for procedures and surgery ? ?New left portal vein thrombosis seen on MRCP ?-Resume anticoagulation once no further procedures are anticipated ? ?Hypophosphatemia ?-Repleted ?  ?AKI (acute kidney injury) (HCC) ?-Resolved with hydration ?  ? GI bleed ?--appreciate GI consultation, EGD 4/13 with findings of small mallory weiss tear ?--continue protonix as ordered for GI protection ?  ?Hypercalcemia ?--resolved after hydration with IV fluid ?  ?Lactic acidosis ?-- resolved with IV fluid ?  ?Hypomagnesemia ?- repleted  ? ?GERD (gastroesophageal reflux disease) ?--protonix for GI protection  ?  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 ?Hyponatremia ?- Sodium of 128 today, will encourage oral intake ? ?  ? ? ?DVT prophylaxis: Heparin ?Code Status: DNR ?Family Communication: None at bedside, D/W son by phone ?Disposition:  ? ?Status is: Inpatient ? ?  ?Consultants:  ?GI  ?[place]ia[place] ?[facility] at [facility] ? ? ?Subjective: ? ?Reports generalized weakness, fatigue, denies nausea, vomiting or abdominal pain. ? ?Objective: ?Vitals:  ? 07/02/21 0021 07/02/21 0436 07/02/21 0500 07/02/21 0750  ?BP: 139/75 (!) 158/86  (!) 142/71  ?Pulse: 99 96 94 95  ?Resp: (!) 24 (!) 28 (!) 31 20  ?Temp: 98.5 ?F (36.9 ?C) 99 ?F (37.2 ?C)  98.8 ?F (37.1 ?C)  ?TempSrc: Oral Oral  Oral  ?SpO2: 97% 97% 95% 96%  ?Weight:      ?Height:      ? ? ?Intake/Output Summary (Last 24 hours) at 07/02/2021 1134 ?Last data filed at 07/02/2021 0931 ?Gross per 24 hour  ?Intake 52.59 ml  ?Output 1700 ml  ?Net -1647.41 ml  ? ? ?Filed Weights  ? 06/28/21 0500 06/29/21 0500 06/30/21 0500  ?Weight: 69.3 kg 71.1 kg 71.1 kg  ? ? ?Examination: ? ?Awake Alert, Oriented X 3, frail, chronically ill-appearing. ?Symmetrical Chest wall movement, Good air movement bilaterally,  CTAB ?RRR,No Gallops,Rubs or new Murmurs, No Parasternal Heave ?+ve B.Sounds, Abd Soft, No tenderness, No rebound - guarding or rigidity. ?No Cyanosis, Clubbing or edema, No new Rash or bruise   ? ? ? ? ?Data Reviewed: I have personally reviewed following labs and imaging studies ? ?CBC: ?Recent Labs  ?Lab 06/26/21 ?0255 06/27/21 ?0422 06/28/21 ?0405 06/29/21 ?[id number] 06/30/21 ?[id number] 07/02/21 ?[id number]  ?WBC 4.6 7.7 11.6* 7.5 5.2 8.1  ?NEUTROABS 3.3 6.7 9.8* 6.4 4.1  --   ?HGB 10.0* 11.4* 10.5* 9.6* 9.3* 8.8*  ?HCT 30.4* 33.0* 31.8* 27.7* 26.0* 26.4*  ?MCV 92.1 89.2 91.6 91.1 89.3 92.0  ?PLT 67* 83* 52* 62* 77* 117*  ? ? ? ?Basic Metabolic Panel: ?Recent Labs  ?Lab 06/26/21 ?0255 06/27/21 ?0422 06/28/21 ?0405 06/29/21 ?[id number] 06/30/21 ?[id number] 07/01/21 ?1110 07/02/21 ?[id number]  ?NA 135 131* 132* 136 136  --  128*  ?K 3.9 3.6 3.5 3.4* 3.4*  --  3.5  ?CL 101 98 104 108 109  --  99  ?CO2 27 23 21* 22 21*  --  20*  ?GLUCOSE 112* 143* 131* 106* 114*  --  112*  ?BUN 11  8 8 12 10   --  6*  ?CREATININE 0.79 0.62 0.62 0.61 0.71  --  0.77  ?CALCIUM 8.3* 8.4* 7.7* 7.9* 7.8*  --  7.9*  ?MG 1.9 1.9  --  1.9 1.9 1.7  --   ? ? ? ?GFR: ?Estimated Creatinine Clearance: 65.4 mL/min (by C-G formula based on SCr of 0.77 mg/dL). ? ?Liver Function Tests: ?Recent Labs  ?Lab 06/27/21 ?0422 06/28/21 ?0405 06/29/21 ?07/01/21 06/30/21 ?07/02/21 07/02/21 ?07/04/21  ?AST 132* 60* 78* 50* 18  ?ALT 56* 38 38 34 18  ?ALKPHOS 343* 280* 269* 284* 195*  ?BILITOT 4.1* 5.5* 3.8* 2.6* 2.3*  ?PROT 6.3* 5.2* 5.1* 5.2* 5.1*  ?ALBUMIN 2.9* 2.3* 2.2* 2.2* 2.0*  ? ? ? ?CBG: ?Recent Labs  ?Lab 06/25/21 ?1158  ?GLUCAP 95  ? ? ? ? ?Recent Results (from the past 240 hour(s))  ?Blood Culture (routine x 2)     Status: Abnormal  ? Collection Time: 06/23/21  8:45 PM  ? Specimen: BLOOD RIGHT FOREARM  ?Result Value Ref Range Status  ? Specimen Description   Final  ?  BLOOD RIGHT FOREARM ?Performed at [facility], [address], [place], [place] ?  ? Special Requests   Final  ?  BOTTLES  DRAWN AEROBIC AND ANAEROBIC Blood Culture adequate volume ?Performed at [facility], [address] [place], [place] ?  ? Culture  Setup Time   Final  ?  GRAM NEGATIVE RODS IN BOTH AEROBIC AND ANAEROBIC

## 2021-07-03 ENCOUNTER — Inpatient Hospital Stay (HOSPITAL_COMMUNITY): Payer: Medicare Other

## 2021-07-03 ENCOUNTER — Encounter (HOSPITAL_COMMUNITY)
Admission: EM | Disposition: A | Discharge status: Skilled Nursing Facility | Payer: Self-pay | Source: Home / Self Care | Attending: Internal Medicine

## 2021-07-03 ENCOUNTER — Inpatient Hospital Stay (HOSPITAL_COMMUNITY): Payer: Medicare Other | Admitting: Certified Registered"

## 2021-07-03 ENCOUNTER — Encounter (HOSPITAL_COMMUNITY): Payer: Self-pay | Admitting: Family Medicine

## 2021-07-03 DIAGNOSIS — Z7901 Long term (current) use of anticoagulants: Secondary | ICD-10-CM | POA: Diagnosis not present

## 2021-07-03 DIAGNOSIS — D696 Thrombocytopenia, unspecified: Secondary | ICD-10-CM | POA: Diagnosis not present

## 2021-07-03 DIAGNOSIS — K8050 Calculus of bile duct without cholangitis or cholecystitis without obstruction: Secondary | ICD-10-CM

## 2021-07-03 DIAGNOSIS — K8309 Other cholangitis: Secondary | ICD-10-CM | POA: Diagnosis not present

## 2021-07-03 DIAGNOSIS — K8001 Calculus of gallbladder with acute cholecystitis with obstruction: Secondary | ICD-10-CM | POA: Diagnosis not present

## 2021-07-03 DIAGNOSIS — M25462 Effusion, left knee: Secondary | ICD-10-CM

## 2021-07-03 HISTORY — PX: SPHINCTEROTOMY: SHX5544

## 2021-07-03 HISTORY — PX: REMOVAL OF STONES: SHX5545

## 2021-07-03 HISTORY — PX: ERCP: SHX5425

## 2021-07-03 LAB — CBC
HCT: 25.6 % — ABNORMAL LOW (ref 39.0–52.0)
Hemoglobin: 8.7 g/dL — ABNORMAL LOW (ref 13.0–17.0)
MCH: 31.2 pg (ref 26.0–34.0)
MCHC: 34 g/dL (ref 30.0–36.0)
MCV: 91.8 fL (ref 80.0–100.0)
Platelets: 136 10*3/uL — ABNORMAL LOW (ref 150–400)
RBC: 2.79 MIL/uL — ABNORMAL LOW (ref 4.22–5.81)
RDW: 14.7 % (ref 11.5–15.5)
WBC: 8.4 10*3/uL (ref 4.0–10.5)
nRBC: 0 % (ref 0.0–0.2)

## 2021-07-03 LAB — COMPREHENSIVE METABOLIC PANEL
ALT: 16 U/L (ref 0–44)
AST: 17 U/L (ref 15–41)
Albumin: 1.9 g/dL — ABNORMAL LOW (ref 3.5–5.0)
Alkaline Phosphatase: 182 U/L — ABNORMAL HIGH (ref 38–126)
Anion gap: 7 (ref 5–15)
BUN: 8 mg/dL (ref 8–23)
CO2: 24 mmol/L (ref 22–32)
Calcium: 8.2 mg/dL — ABNORMAL LOW (ref 8.9–10.3)
Chloride: 102 mmol/L (ref 98–111)
Creatinine, Ser: 0.83 mg/dL (ref 0.61–1.24)
GFR, Estimated: >60 mL/min (ref >60)
Glucose, Bld: 112 mg/dL — ABNORMAL HIGH (ref 70–99)
Potassium: 3.5 mmol/L (ref 3.5–5.1)
Sodium: 133 mmol/L — ABNORMAL LOW (ref 135–145)
Total Bilirubin: 1.7 mg/dL — ABNORMAL HIGH (ref 0.3–1.2)
Total Protein: 5.3 g/dL — ABNORMAL LOW (ref 6.5–8.1)

## 2021-07-03 LAB — GLUCOSE, CAPILLARY: Glucose-Capillary: 137 mg/dL — ABNORMAL HIGH (ref 70–99)

## 2021-07-03 SURGERY — ERCP, WITH INTERVENTION IF INDICATED
Anesthesia: General

## 2021-07-03 MED ORDER — PHENYLEPHRINE HCL-NACL 20-0.9 MG/250ML-% IV SOLN
INTRAVENOUS | Status: DC | PRN
  Administered 2021-07-03: 25 ug/min via INTRAVENOUS

## 2021-07-03 MED ORDER — PROPOFOL 10 MG/ML IV BOLUS
INTRAVENOUS | Status: DC | PRN
  Administered 2021-07-03: 70 mg via INTRAVENOUS
  Administered 2021-07-03: 10 mg via INTRAVENOUS

## 2021-07-03 MED ORDER — BUPIVACAINE HCL (PF) 0.5 % IJ SOLN
10.0000 mL | Freq: Once | INTRAMUSCULAR | Status: DC
  Filled 2021-07-03: qty 10

## 2021-07-03 MED ORDER — DICLOFENAC SUPPOSITORY 100 MG
RECTAL | Status: AC
  Filled 2021-07-03: qty 1

## 2021-07-03 MED ORDER — FENTANYL CITRATE (PF) 100 MCG/2ML IJ SOLN
INTRAMUSCULAR | Status: AC
  Filled 2021-07-03: qty 2

## 2021-07-03 MED ORDER — FENTANYL CITRATE (PF) 100 MCG/2ML IJ SOLN
INTRAMUSCULAR | Status: DC | PRN
  Administered 2021-07-03: 25 ug via INTRAVENOUS
  Administered 2021-07-03: 50 ug via INTRAVENOUS

## 2021-07-03 MED ORDER — ROCURONIUM BROMIDE 10 MG/ML (PF) SYRINGE
PREFILLED_SYRINGE | INTRAVENOUS | Status: DC | PRN
  Administered 2021-07-03: 20 mg via INTRAVENOUS
  Administered 2021-07-03: 50 mg via INTRAVENOUS

## 2021-07-03 MED ORDER — SODIUM CHLORIDE 0.9 % IV SOLN
INTRAVENOUS | Status: DC | PRN
  Administered 2021-07-03: 110 mL

## 2021-07-03 MED ORDER — DEXAMETHASONE SODIUM PHOSPHATE 10 MG/ML IJ SOLN
INTRAMUSCULAR | Status: DC | PRN
Start: 2021-07-03 — End: 2021-07-03
  Administered 2021-07-03: 4 mg via INTRAVENOUS

## 2021-07-03 MED ORDER — PHENYLEPHRINE 80 MCG/ML (10ML) SYRINGE FOR IV PUSH (FOR BLOOD PRESSURE SUPPORT)
PREFILLED_SYRINGE | INTRAVENOUS | Status: DC | PRN
  Administered 2021-07-03 (×2): 80 ug via INTRAVENOUS
  Administered 2021-07-03 (×3): 160 ug via INTRAVENOUS
  Administered 2021-07-03 (×3): 80 ug via INTRAVENOUS

## 2021-07-03 MED ORDER — METHYLPREDNISOLONE ACETATE 40 MG/ML IJ SUSP
40.0000 mg | Freq: Once | INTRAMUSCULAR | Status: DC
  Filled 2021-07-03: qty 1

## 2021-07-03 MED ORDER — ONDANSETRON HCL 4 MG/2ML IJ SOLN
INTRAMUSCULAR | Status: DC | PRN
  Administered 2021-07-03: 4 mg via INTRAVENOUS

## 2021-07-03 MED ORDER — GLUCAGON HCL RDNA (DIAGNOSTIC) 1 MG IJ SOLR
INTRAMUSCULAR | Status: DC | PRN
  Administered 2021-07-03 (×2): .25 mg via INTRAVENOUS

## 2021-07-03 MED ORDER — SUGAMMADEX SODIUM 200 MG/2ML IV SOLN
INTRAVENOUS | Status: DC | PRN
  Administered 2021-07-03: 200 mg via INTRAVENOUS

## 2021-07-03 MED ORDER — GLUCAGON HCL RDNA (DIAGNOSTIC) 1 MG IJ SOLR
INTRAMUSCULAR | Status: AC
  Filled 2021-07-03: qty 1

## 2021-07-03 MED ORDER — CIPROFLOXACIN IN D5W 400 MG/200ML IV SOLN
INTRAVENOUS | Status: AC
  Filled 2021-07-03: qty 200

## 2021-07-03 MED ORDER — DICLOFENAC SUPPOSITORY 100 MG
RECTAL | Status: DC | PRN
  Administered 2021-07-03: 100 mg via RECTAL

## 2021-07-03 MED ORDER — LIDOCAINE 2% (20 MG/ML) 5 ML SYRINGE
INTRAMUSCULAR | Status: DC | PRN
  Administered 2021-07-03: 80 mg via INTRAVENOUS

## 2021-07-03 NOTE — Consult Note (Signed)
[facility] ?Consult Note ? Jon Bennett ?24-Feb-1935  ?[id number].   ? ?Requesting MD: Huey Bienenstock ?Chief Complaint/Reason for Consult: cholangitis  ? ?HPI:  ?Jon Bennett  is an 87yo male PMH Splenic vein thrombosis on eliquis, DM, HTN, hx CVA who was transferred from [facility] to [facility] for ERCP. Patient presented to the ED on 06/23/21 with 3 weeks of nausea and vomiting. LFTs were elelvated and he was found to have blood cultures positive for E. Coli. Gallbladder on imaging was found to be full of stones so initial plan was for surgery.  Patient then spiked a fever with increasing white blood cell count and there was concern for cholangitis. MRCP positive for choledocholithiasis. There was some altered mental status and platelet count decreased so it was decided that he needed to be transferred to [facility] for ERCP. He is scheduled for ERCP today. General surgery asked to see for consideration of cholecystectomy. ? ?Today he complains of hunger as he is NPO for ERCP and of left knee pain but otherwise no complaints. No abdominal pain nausea or emesis. Ate dysphagia 2 diet yesterday without any pain, nausea, emesis ? ?He was taking his eliquis as prescribed leading up to admission ?Abdominal surgical history: open appendectomy at 87 years old ?Nonsmoker ?H/o heavy alcohol use, denies heavy use in several months ?Denies illicit drug use ? ?Son is bedside ? ?Review of Systems  ?Constitutional:  Negative for chills and fever.  ?Respiratory:  Negative for cough and shortness of breath.   ?Cardiovascular:  Negative for chest pain and palpitations.  ?Gastrointestinal:  Negative for abdominal pain, diarrhea, nausea and vomiting.  ?Genitourinary: Negative.   ? ? ?Family History  ?Problem Relation Age of Onset  ? Hypertension Other   ? Heart disease Father   ? Healthy Sister   ? Healthy Brother   ? Cancer Maternal Grandfather   ? Healthy Brother   ? Healthy Brother   ? Healthy Sister   ? Healthy Sister   ?  Healthy Sister   ? Healthy Brother   ? Healthy Son   ? Healthy Son   ? Healthy Son   ? Healthy Son   ? ? ?Past Medical History:  ?Diagnosis Date  ? Diabetes mellitus without complication (HCC)   ? Patient is not on any medication for this  ? GERD (gastroesophageal reflux disease)   ? Hypertension   ? Pancreatitis, alcoholic, acute   ? Splenic vein thrombosis   ? ? ?Past Surgical History:  ?Procedure Laterality Date  ? APPENDECTOMY    ? ESOPHAGOGASTRODUODENOSCOPY (EGD) WITH PROPOFOL N/A 06/25/2021  ? Procedure: ESOPHAGOGASTRODUODENOSCOPY (EGD) WITH PROPOFOL;  Surgeon: Malissa Hippo, MD;  Location: [facility];  Service: Endoscopy;  Laterality: N/A;  ? VASCULAR SURGERY    ? ? ?Social History:  reports that he has never smoked. He has never used smokeless tobacco. He reports that he does not currently use alcohol after a past usage of about 1.0 standard drink per week. He reports that he does not use drugs. ? ?Allergies:  ?Allergies  ?Allergen Reactions  ? Asa [Aspirin] Shortness Of Breath  ? ? ?Medications Prior to Admission  ?Medication Sig Dispense Refill  ? acamprosate (CAMPRAL) 333 MG tablet Take 666 mg by mouth 3 (three) times daily.    ? acetaminophen (TYLENOL) 500 MG tablet Take 500 mg by mouth daily as needed for mild pain or moderate pain.    ? busPIRone (BUSPAR) 10 MG tablet Take 1 tablet (  10 mg total) by mouth 2 (two) times daily. 10 tablet 0  ? doxepin (SINEQUAN) 25 MG capsule Take 25 mg by mouth at bedtime.    ? ELIQUIS 5 MG TABS tablet TAKE 1 TABLET BY MOUTH TWICE A DAY 60 tablet 2  ? famotidine (PEPCID) 20 MG tablet Take 1 tablet (20 mg total) by mouth at bedtime.    ? metoprolol tartrate (LOPRESSOR) 25 MG tablet Take 1 tablet (25 mg total) by mouth 2 (two) times daily. 60 tablet 1  ? pantoprazole (PROTONIX) 40 MG tablet Take 1 tablet (40 mg total) by mouth daily. 30 tablet 0  ? sertraline (ZOLOFT) 100 MG tablet Take 100 mg by mouth daily.    ? tamsulosin (FLOMAX) 0.4 MG CAPS capsule Take 0.4 mg by  mouth daily.    ? diclofenac Sodium (VOLTAREN) 1 % GEL Apply 2 g topically 4 (four) times daily. (Patient not taking: Reported on 06/24/2021)    ? HYDROcodone-acetaminophen (NORCO/VICODIN) 5-325 MG tablet Take 2 tablets by mouth every 4 (four) hours as needed. (Patient not taking: Reported on 06/24/2021) 6 tablet 0  ? ondansetron (ZOFRAN-ODT) 4 MG disintegrating tablet Take 1 tablet (4 mg total) by mouth every 8 (eight) hours as needed for nausea or vomiting. (Patient not taking: Reported on 06/24/2021) 20 tablet 0  ? thiamine 100 MG tablet Take 1 tablet (100 mg total) by mouth daily. (Patient not taking: Reported on 06/24/2021) 30 tablet 0  ? vitamin B-12 (CYANOCOBALAMIN) 1000 MCG tablet Take 1 tablet (1,000 mcg total) by mouth daily. (Patient not taking: Reported on 06/24/2021) 30 tablet 2  ? Vitamin D, Ergocalciferol, (DRISDOL) 1.25 MG (50000 UNIT) CAPS capsule Take 50,000 Units by mouth every 7 (seven) days. (Patient not taking: Reported on 06/24/2021)    ? ? ?Prior to Admission medications   ?Medication Sig Start Date End Date Taking? Authorizing Provider  ?acamprosate (CAMPRAL) 333 MG tablet Take 666 mg by mouth 3 (three) times daily. 06/10/21  Yes [provider]  ?acetaminophen (TYLENOL) 500 MG tablet Take 500 mg by mouth daily as needed for mild pain or moderate pain.   Yes [provider]  ?busPIRone (BUSPAR) 10 MG tablet Take 1 tablet (10 mg total) by mouth 2 (two) times daily. 08/27/20  Yes Shah, Pratik D, DO  ?doxepin (SINEQUAN) 25 MG capsule Take 25 mg by mouth at bedtime. 06/04/19  Yes [provider]  ?ELIQUIS 5 MG TABS tablet TAKE 1 TABLET BY MOUTH TWICE A DAY 05/20/21  Yes Doreatha MassedKatragadda, Sreedhar, MD  ?famotidine (PEPCID) 20 MG tablet Take 1 tablet (20 mg total) by mouth at bedtime. 06/30/19  Yes Vassie LollMadera, Carlos, MD  ?metoprolol tartrate (LOPRESSOR) 25 MG tablet Take 1 tablet (25 mg total) by mouth 2 (two) times daily. 08/18/18  Yes Tat, Onalee Huaavid, MD  ?pantoprazole (PROTONIX) 40 MG tablet  Take 1 tablet (40 mg total) by mouth daily. 06/04/21  Yes Pollyann SavoySheldon, Charles B, MD  ?sertraline (ZOLOFT) 100 MG tablet Take 100 mg by mouth daily. 08/27/19  Yes [provider]  ?tamsulosin (FLOMAX) 0.4 MG CAPS capsule Take 0.4 mg by mouth daily. 06/09/21  Yes [provider]  ?diclofenac Sodium (VOLTAREN) 1 % GEL Apply 2 g topically 4 (four) times daily. ?Patient not taking: Reported on 06/24/2021 02/03/21   [provider]  ?HYDROcodone-acetaminophen (NORCO/VICODIN) 5-325 MG tablet Take 2 tablets by mouth every 4 (four) hours as needed. ?Patient not taking: Reported on 06/24/2021 08/27/20   Maurilio LovelyShah, Pratik D, DO  ?ondansetron (ZOFRAN-ODT)  4 MG disintegrating tablet Take 1 tablet (4 mg total) by mouth every 8 (eight) hours as needed for nausea or vomiting. ?Patient not taking: Reported on 06/24/2021 06/04/21   Pollyann Savoy, MD  ?thiamine 100 MG tablet Take 1 tablet (100 mg total) by mouth daily. ?Patient not taking: Reported on 06/24/2021 08/28/20   Maurilio Lovely D, DO  ?vitamin B-12 (CYANOCOBALAMIN) 1000 MCG tablet Take 1 tablet (1,000 mcg total) by mouth daily. ?Patient not taking: Reported on 06/24/2021 06/30/19   Vassie Loll, MD  ?Vitamin D, Ergocalciferol, (DRISDOL) 1.25 MG (50000 UNIT) CAPS capsule Take 50,000 Units by mouth every 7 (seven) days. ?Patient not taking: Reported on 06/24/2021    [provider]  ? ? ?Blood pressure 136/78, pulse 95, temperature 99.8 ?F (37.7 ?C), temperature source Oral, resp. rate 18, height 5' 7.5" (1.715 m), weight 71.1 kg, SpO2 96 %. ?Physical Exam: ?General: pleasant, WD/WN male who is laying in bed in NAD ?HEENT: head is normocephalic, atraumatic.  Sclera are noninjected.  .  Ears and nose without any masses or lesions.  Mouth is pink and moist. edentulous ?Heart: regular, rate, and rhythm.  Normal s1,s2. No obvious murmurs, gallops, or rubs noted.  Palpable pedal pulses bilaterally  ?Lungs: CTAB, no wheezes, rhonchi, or rales noted.  Respiratory  effort nonlabored ?Abd: soft, NT/ND, +BS, no masses, hernias, or organomegaly. Negative Murphy's sign. Well healed RLQ surgical scar ?MS: no BUE/BLE edema, calves soft and nontender. Right knee is warm, TTP

## 2021-07-03 NOTE — Anesthesia Procedure Notes (Signed)
Procedure Name: Intubation ?Date/Time: 07/03/2021 3:31 PM ?Performed by: Genelle Bal, CRNA ?Pre-anesthesia Checklist: Patient identified, Emergency Drugs available, Suction available and Patient being monitored ?Patient Re-evaluated:Patient Re-evaluated prior to induction ?Oxygen Delivery Method: Circle system utilized ?Preoxygenation: Pre-oxygenation with 100% oxygen ?Induction Type: IV induction ?Ventilation: Mask ventilation without difficulty ?Laryngoscope Size: Sabra Heck and 2 ?Grade View: Grade I ?Tube type: Oral ?Tube size: 7.5 mm ?Number of attempts: 1 ?Airway Equipment and Method: Stylet and Oral airway ?Placement Confirmation: ETT inserted through vocal cords under direct vision, positive ETCO2 and breath sounds checked- equal and bilateral ?Secured at: 22 cm ?Tube secured with: Tape ?Dental Injury: Teeth and Oropharynx as per pre-operative assessment  ? ? ? ? ?

## 2021-07-03 NOTE — Progress Notes (Signed)
POST ERCP NOTE: ? ?Pt doing well.  ? ?No abdominal pain or nausea or melena. Vitals stable. Abdominal Exam: soft, nontender. ? ?Proceed with clear liquid diet. ?Discussed with son [name]) ? ?[address] ? ?

## 2021-07-03 NOTE — Progress Notes (Signed)
?PROGRESS NOTE ? ? ? ?Jon Bennett  ZOX:[id number] DOB: 12/14/1934 DOA: 06/23/2021 ?PCP: Julieanne CottonPaul, [name], MD  ? ? ?Chief Complaint  ?Patient presents with  ? Vomiting  ? ? ?Brief Narrative:  ? ?87 y.o. male with medical history significant of type 2 diabetes mellitus, hypertension, splenic vein thrombosis, alcohol withdrawal, GERD who presents to the emergency department due to several days of onset of nausea and vomiting and increased urinary frequency.  He denies any abdominal pain, chest pain, headache, blurry vision, shortness of breath.  Patient states that he has abstained from alcohol for several months. Patient was seen in the ED on 3/23 due to alcoholic gastritis, he was treated in the ED and discharged home.  Apparently, patient had a pint of vodka about 4 days prior to the ED visit.  ?-He was noted to be febrile, tachypneic and tachycardic, work-up significant for ascending cholangitis, [facility], but [facility] was [facility] for further management per GI. ?    ?His blood cultures 4/11 positive for E coli. Pharmacy changed abx to ceftriaxone on 4/13.    ?  ?06/26/2021 : surgery planning for cholecystectomy in next couple of days. Narrowed antibiotics for E colic bacteremia to cefazolin starting on 4/14.   ?  ?06/27/2021: spike in bilirubin to 4.1 today.  GI planning MRCP on 4/17.  Timing of cholecystectomy to be determined after further testing.   Pt developed worsening sepsis, spiked fever, ammonia elevated, WBC and procalcitonin rising. Transferred to stepdown ICU.  Spoke with son and DNR order confirmed.  Repeated blood cultures. Broadened antibiotics.   ?  ?06/28/2021:  Worsening encephalopathy today, platelets down to 52.  WBC and procalcitonin rising.  Conferenced with GI and surgery.  Recommending ERCP but GI reports he is too complex to have done [facility].  [facility] or [facility] per GI. Dr. Marletta Lorarver reaching out to GI team in  [place].  I spoke with family if they wanted to proceed with ERCP.  I spoke with son Loraine LericheMark who is agreeable to ERCP understanding that there are significant risks involved.  He was agreeable to [facility].  ?  ?06/29/2021: pt still [facility].  Cancelled NPO order and allowed to eat.  Ammonia coming down.  I updated son Loraine LericheMark about situation with transfer. Pt clinically appears nontoxic.  Bilirubin remains elevated but slightly improved today.   ?  ?06/30/2021: bilirubin down to 2.6 today.  Encephalopathy improved and lactulose discontinued due to loose stools.   ? ?07/02/2021: MRCP significant for cholelithiasis, plan for ERCP  ? ? ?Assessment & Plan: ?  ?Principal Problem: ?  Intractable nausea and vomiting ?Active Problems: ?  Thrombocytopenia (HCC) ?  GERD (gastroesophageal reflux disease) ?  Sepsis from E coli bacteremia  ?  Hypomagnesemia ?  Lactic acidosis ?  Hypercalcemia ?  Transaminitis ?  GI bleed ?  AKI (acute kidney injury) (HCC) ?  Hypophosphatemia ?  Ascending cholangitis with severe sepsis ?  Chronic anticoagulation ?  Calculus of gallbladder with acute cholecystitis and obstruction ?  Hyperbilirubinemia ?  Hypokalemia ? ? ?Ascending cholangitis with severe sepsis ?-Enzymes appears to be improving, ultrasound [facility] showing no biliary dilation or choledocholithiasis. ?-Continue with IV Zosyn ?- MRCP did show choledocholithiasis with a distal common duct stone approximately 7 mm and possibly smaller more proximal common duct stones without significant biliary dilatation.  LFTs continue to trend down. ?-Plan for ERCP today ?-Surgery consulted, for lap cholecystectomy after his ERCP today. ?  ?Calculus of gallbladder with acute cholecystitis and obstruction ?-Plan for ERCP today, surgery consulted for lap Coley after procedure ? ?Sepsis from E coli bacteremia  ?-Due to above, continue with IV Zosyn. ? ?Intractable nausea and  vomiting ?- due to above, resolved ?  ?Hypokalemia ?-- repleted  ?  ?Hyperbilirubinemia ?-Due to above, trending down ?  ?History of splenic vein thrombosis ?-Coagulation which has been held since admission anticipation for procedures and surgery ? ?New left portal vein thrombosis seen on MRCP ?-Resume anticoagulation once no further procedures are anticipated ? ?Hypophosphatemia ?-Repleted ?  ?AKI (acute kidney injury) (HCC) ?-Resolved with hydration ?  ? GI bleed ?--appreciate GI consultation, EGD 4/13 with findings of small mallory weiss tear ?--continue protonix as ordered for GI protection ?  ?Hypercalcemia ?--resolved after hydration with IV fluid ?  ?Lactic acidosis ?-- resolved with IV fluid ?  ?Hypomagnesemia ?- repleted  ? ?GERD (gastroesophageal reflux disease) ?--protonix for GI protection  ?  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 ?Hyponatremia ?-Improving ? ?Left Knee effusion ?-Patient complains of pain, left knee is swollen, tender to palpation, discussed with Ortho who will attempt arthrocentesis today ? ?It was encouraged with incentive spirometer secondary to atelectasis evident on his x-ray. ? ?  ? ? ?DVT prophylaxis: Heparin ?Code Status: DNR ?Family Communication: None at bedside, D/W son by phone ?Disposition:  ? ?Status is: Inpatient ? ?  ?Consultants:  ?GI  ?Palliaitve ?General surgery  ?orthopedic ? ? ?Subjective: ? ?Generalized weakness, fatigue, no nausea, no vomiting, still having diarrhea, left knee pain has improved. ? ?Objective: ?Vitals:  ? 07/03/21 0105 07/03/21 0400 07/03/21 0846 07/03/21 1224  ?BP: (!) 129/57 123/66 136/78 (!) 143/78  ?Pulse: 97 90 95 92  ?Resp: 16 20 18 18   ?Temp: 99 ?F (37.2 ?C) 98 ?F (36.7 ?C) 99.8 ?F (37.7 ?C) 98.4 ?F (36.9 ?C)  ?TempSrc: Oral Oral Oral Oral  ?SpO2: 95% 95% 96% 98%  ?Weight:      ?Height:      ? ?No intake or output data in the 24 hours ending 07/03/21 1353 ? ?Filed Weights  ? 06/28/21 0500 06/29/21 0500  06/30/21 0500  ?Weight: 69.3 kg 71.1 kg 71.1 kg  ? ? ?Examination: ? ?Awake Alert, Oriented X 3, frail, chronically ill-appearing, deconditioned ?Symmetrical Chest wall movement, Good air movement bilaterally, CTAB ?RRR,No Gallops,Rubs or new Murmurs, No Parasternal Heave ?+ve B.Sounds, Abd Soft, No tenderness, No rebound - guarding or rigidity. ?No Cyanosis, Clubbing or edema, No new Rash or bruise, left knee swelling ? ? ? ? ? ?Data Reviewed: I have personally reviewed following labs and imaging studies ? ?CBC: ?Recent Labs  ?Lab 06/27/21 ?0422 06/28/21 ?0405 06/29/21 ?07/01/21 06/30/21 ?07/02/21 07/02/21 ?07/04/21 07/03/21 ?0105  ?WBC 7.7 11.6* 7.5 5.2 8.1 8.4  ?NEUTROABS 6.7 9.8* 6.4 4.1  --   --   ?HGB 11.4* 10.5* 9.6* 9.3* 8.8* 8.7*  ?HCT 33.0* 31.8* 27.7* 26.0* 26.4* 25.6*  ?MCV 89.2 91.6 91.1 89.3 92.0 91.8  ?PLT 83* 52* 62* 77* 117* 136*  ? ? ? ?Basic Metabolic Panel: ?Recent Labs  ?Lab 06/27/21 ?0422 06/28/21 ?0405 06/29/21 ?07/01/21 06/30/21 ?07/02/21 07/01/21 ?1110 07/02/21 ?07/04/21 07/03/21 ?0105  ?NA 131* 132* 136 136  --  128* 133*  ?K 3.6 3.5 3.4* 3.4*  --  3.5 3.5  ?CL 98 104 108 109  --  99 102  ?CO2 23 21* 22 21*  --  20* 24  ?GLUCOSE 143* 131* 106* 114*  --  112* 112*  ?BUN 8 8 12 10   --  6* 8  ?CREATININE 0.62 0.62 0.61 0.71  --  0.77 0.83  ?CALCIUM 8.4* 7.7* 7.9* 7.8*  --  7.9* 8.2*  ?MG 1.9  --  1.9 1.9 1.7  --   --   ? ? ? ?GFR: ?Estimated Creatinine Clearance: 63.1 mL/min (by C-G formula based on SCr of 0.83 mg/dL). ? ?Liver Function Tests: ?Recent Labs  ?Lab 06/28/21 ?0405 06/29/21 ?0331 06/30/21 ?07/02/21 07/02/21 ?07/04/21 07/03/21 ?0105  ?AST 60* 78* 50* 18 17  ?ALT 38 38 34 18 16  ?ALKPHOS 280* 269* 284* 195* 182*  ?BILITOT 5.5* 3.8* 2.6* 2.3* 1.7*  ?PROT 5.2* 5.1* 5.2* 5.1* 5.3*  ?ALBUMIN 2.3* 2.2* 2.2* 2.0* 1.9*  ? ? ? ?CBG: ?No results for input(s): GLUCAP in the last 168 hours. ? ? ? ?Recent Results (from the past 240 hour(s))  ?Blood Culture (routine x 2)     Status: Abnormal  ? Collection Time: 06/23/21  8:45  PM  ? Specimen: BLOOD RIGHT FOREARM  ?Result Value Ref Range Status  ? Specimen Description   Final  ?  BLOOD RIGHT FOREARM ?Performed at [facility], [address], [place], [place] ?  ? Special Requests

## 2021-07-03 NOTE — Anesthesia Preprocedure Evaluation (Addendum)
Anesthesia Evaluation  ?Patient identified by MRN, date of birth, ID band ?Patient awake ? ? ? ?Reviewed: ?Allergy & Precautions, NPO status , Patient's Chart, lab work & pertinent test results ? ?Airway ?Mallampati: II ? ?TM Distance: >3 FB ?Neck ROM: Full ? ? ? Dental ? ?(+) Upper Dentures, Lower Dentures ?  ?Pulmonary ?neg pulmonary ROS,  ?  ?Pulmonary exam normal ? ? ? ? ? ? ? Cardiovascular ?hypertension, Pt. on medications and Pt. on home beta blockers ? ?Rhythm:Regular Rate:Normal ? ? ?  ?Neuro/Psych ?negative neurological ROS ? negative psych ROS  ? GI/Hepatic ?Neg liver ROS, GERD  Medicated,Cholangitis  ?  ?Endo/Other  ?diabetes, Well Controlled ? Renal/GU ?  ?negative genitourinary ?  ?Musculoskeletal ?negative musculoskeletal ROS ?(+)  ? Abdominal ?Normal abdominal exam  (+)   ?Peds ? Hematology ? ?(+) Blood dyscrasia, anemia ,   ?Anesthesia Other Findings ? ? Reproductive/Obstetrics ? ?  ? ? ? ? ? ? ? ? ? ? ? ? ? ?  ?  ? ? ? ? ? ? ? ?Anesthesia Physical ?Anesthesia Plan ? ?ASA: 2 ? ?Anesthesia Plan: General  ? ?Post-op Pain Management:   ? ?Induction: Intravenous ? ?PONV Risk Score and Plan: 2 and Ondansetron, Dexamethasone and Treatment may vary due to age or medical condition ? ?Airway Management Planned: Mask and Oral ETT ? ?Additional Equipment: None ? ?Intra-op Plan:  ? ?Post-operative Plan: Extubation in OR ? ?Informed Consent: I have reviewed the patients History and Physical, chart, labs and discussed the procedure including the risks, benefits and alternatives for the proposed anesthesia with the patient or authorized representative who has indicated his/her understanding and acceptance.  ? ? ? ?Dental advisory given ? ?Plan Discussed with: CRNA ? ?Anesthesia Plan Comments: (Lab Results ?     Component                Value               Date                 ?     WBC                      8.4                 07/03/2021           ?     HGB                      8.7  (L)             07/03/2021           ?     HCT                      25.6 (L)            07/03/2021           ?     MCV                      91.8                07/03/2021           ?     PLT                      136 (L)  07/03/2021           ?Lab Results ?     Component                Value               Date                 ?     NA                       133 (L)             07/03/2021           ?     K                        3.5                 07/03/2021           ?     CO2                      24                  07/03/2021           ?     GLUCOSE                  112 (H)             07/03/2021           ?     BUN                      8                   07/03/2021           ?     CREATININE               0.83                07/03/2021           ?     CALCIUM                  8.2 (L)             07/03/2021           ?     GFRNONAA                 >60                 07/03/2021          )  ? ? ? ? ? ? ?Anesthesia Quick Evaluation ? ?

## 2021-07-03 NOTE — TOC Progression Note (Signed)
Transition of Care (TOC) - Progression Note  ? ? ?Patient Details  ?Name: Jon Bennett ?MRN: [id number] ?Date of Birth: June 12, 1934 ? ?Transition of Care (TOC) CM/SW Contact  ?Mearl Latin, LCSW ?Phone Number: ?07/03/2021, 2:12 PM ? ?Clinical Narrative:    ?CSW received consult for possible SNF placement at time of discharge. CSW spoke with patient. Patient reported that he ives with his son and patient's son is currently unable to care for patient at their home given patient?s current physical needs and fall risk. Patient expressed understanding of PT recommendation and is agreeable to SNF placement at time of discharge. Patient reports preference for near [place] if possible. CSW discussed insurance authorization process and will provide Medicare SNF ratings list. Patient has received COVID vaccines. CSW will send out referrals for review and present offers to patient and his son. No further questions reported at this time.  ? ?Skilled Nursing Rehab Facilities-   [email or web address]   Ratings out of 5 possible   ?Name Address  Phone # Quality Care Staffing Health Inspection Overall  ?[facility] [address] [place] [phone] 4 5 2 3   ?[facility]  [address], [place] [phone] 3 2 5 5   ?[facility] [address] [place], [address] [place] 3 1 1 1   ?[facility] [address] [facility] 3 2 4 4   ?[facility] [address], [phone] 1 1 2 1   ?[facility] [address], [place] [phone] 2 1 4 3   ?[facility] [address], [address] [place] 5 2 3 4   ?[facility] [address] [facility] [place] 5 2 2 3   ?[place] ([facility]) [address], [facility] 5 1 2 2   ?[facility] [address], [address] [phone] 4 1 2 1   ?[facility] [address] [place] [phone] 4 1 2 1   ?[facility]  ([place]) [address] , [place] Otto [phone] 4 1 1 1   ? [address] [place], [place] [phone] 3 2 4 4   ?        ?[facility] [address], [facility] [place]      ?[facility] [address] [phone] 4 2 3 3   ?[facility] [address] [phone] 4 1 5 4   ?[facility] [place] [address], [facility] 2 1 1 1   ?[facility] [address] [phone] 2 1 3 2   ?        ?[address] (no [facility]) [address], [phone] 4 5 5 5   ?[facility] (No [employer]) [address] [place], [place] 3 1 4 3   ?[facility] (No [employer]) [address] [place] [phone] 5 5 5 5   ?[facility] [address] [place] [phone] 3 2 4 4   ?[facility] [address] [place] [address] [facility] 1 1 2 1   ?[facility] [address], [place] [phone] 2 1 1 1   ?[facility] [address] [place] [phone] 5 2 4 5   ?[facility] [address] [place] 3 1 1 1   ?[facility] [address] [place], [place] [employer] 2 1 2 1   ?        ?[facility] [address] [place] 1 1 1 1   ?[phone] [address], [place]  [phone] 2 4 2 2   ?[facility] [address], [phone]  5 2 3 4   ?[facility] [place] [address], [place] [phone] 2 1 1 1   ?[facility] (No [employer]) [address], [address] 2 1 3 2   ?[facility] [address] [phone] 3 1 1 1   ?        ?[facility] [address] [place], [facility] 5 4 5 5   ?[facility]  [address] [phone] 2 2 3 3   ?[facility] [address] [place], [address] [place] 3 2 4 4   ?[facility] [address] [facility] [place] 4 3 4 4   ?[address] [place], [facility]  [place] 3 3 1 1   ?[facility]) [address] [place] [phone] 2 2 4 4   ? ? ? ? ?Expected Discharge Plan: Skilled Nursing Facility ?Barriers to Discharge: Continued Medical Work up, [place], SNF Pending bed offer ? ?Expected Discharge Plan and Services ?Expected Discharge Plan: Skilled Nursing Facility ?In-house Referral: Clinical Social Work ?  ?  ?Living arrangements for the past 2 months: Single Family Home ?                ?  ?  ?  ?  ?  ?  ?  ?  ?  ?  ? ? ?Social Determinants of Health (SDOH) Interventions ?  ? ?Readmission Risk Interventions ? ?  07/03/2021  ?  2:10 PM 06/26/2021  ?  3:09 PM 08/27/2020  ? 11:33 AM  ?Readmission Risk Prevention Plan  ?Transportation Screening Complete Complete Complete  ?PCP or Specialist Appt within 5-7 Days Complete  Complete  ?Home Care Screening Complete  Complete  ?Medication Review (RN CM) Complete  Complete  ?HRI or Home Care Consult  Complete   ?Palliative Care Screening  Not Applicable   ?Medication Review [facility])  Complete   ? ? ?

## 2021-07-03 NOTE — Progress Notes (Signed)
Patient ID: Jon Bennett, male   DOB: 1935-02-12, 87 y.o.   MRN: [id number] ? ? ? Progress Note ? ? Subjective  ? Day # 9 ?CC; acute cholangitis/E. coli bacteremia ? ?On Zosyn ? ?Labs today, WBC 8.4/hemoglobin 8.7/hematocrit 25.6 ?Potassium 3.5/BUN 8/creatinine 0.83 ?T. bili 1.7/alk phos 182/AST 17/ALT 16 much improved over the past 4 to 5 days ? ?he has no complaint of abdominal pain, no nausea or vomiting has been afebrile ?He has been having diarrhea, Flexi-Seal in place with yellowish liquid stool ? ? Objective  ? ?Vital signs in last 24 hours: ?Temp:  [98 ?F (36.7 ?C)-99.8 ?F (37.7 ?C)] 99.8 ?F (37.7 ?C) (04/21 4628) ?Pulse Rate:  [90-97] 95 (04/21 0846) ?Resp:  [16-20] 18 (04/21 0846) ?BP: (123-145)/(57-78) 136/78 (04/21 0846) ?SpO2:  [95 %-96 %] 96 % (04/21 0846) ?Last BM Date : 07/02/21 ?General:   Elderly white male in no acute distress ?Heart:  Regular rate and rhythm; no murmurs ?Lungs: Respirations even and unlabored, lungs CTA bilaterally ?Abdomen:  Soft, nontender and nondistended. Normal bowel sounds. ?Extremities:  Without edema. ?Neurologic:  Alert and oriented,  grossly normal neurologically. ?Psych:  Cooperative. Normal mood and affect. ? ?Intake/Output from previous day: ?04/20 0701 - 04/21 0700 ?In: -  ?Out: 800 [Urine:800] ?Intake/Output this shift: ?No intake/output data recorded. ? ?Lab Results: ?Recent Labs  ?  07/02/21 ?0633 07/03/21 ?0105  ?WBC 8.1 8.4  ?HGB 8.8* 8.7*  ?HCT 26.4* 25.6*  ?PLT 117* 136*  ? ?BMET ?Recent Labs  ?  07/02/21 ?0633 07/03/21 ?0105  ?NA 128* 133*  ?K 3.5 3.5  ?CL 99 102  ?CO2 20* 24  ?GLUCOSE 112* 112*  ?BUN 6* 8  ?CREATININE 0.77 0.83  ?CALCIUM 7.9* 8.2*  ? ?LFT ?Recent Labs  ?  07/03/21 ?0105  ?PROT 5.3*  ?ALBUMIN 1.9*  ?AST 17  ?ALT 16  ?ALKPHOS 182*  ?BILITOT 1.7*  ? ?PT/INR ?No results for input(s): LABPROT, INR in the last 72 hours. ? ?Studies/Results: ?DG Chest Port 1 View ? ?Result Date: 07/02/2021 ?CLINICAL DATA:  PICC placement EXAM: PORTABLE CHEST 1 VIEW  COMPARISON:  06/27/2021 FINDINGS: Single frontal view of the chest demonstrates interval placement of right-sided PICC, tip overlying the atriocaval junction. The cardiac silhouette is stable. Patchy consolidation has developed at the left lung base, which may reflect hypoventilatory change. No effusion or pneumothorax. Hiatal hernia unchanged. No acute bony abnormalities. IMPRESSION: 1. Right-sided PICC tip overlying atriocaval junction. 2. Hiatal hernia. 3. Developing consolidation at the left lung base, favor atelectasis. Electronically Signed   By: Randa Ngo M.D.   On: 07/02/2021 19:52  ? ?DG Knee Left Port ? ?Result Date: 07/02/2021 ?CLINICAL DATA:  Left knee edema EXAM: PORTABLE LEFT KNEE - 1-2 VIEW COMPARISON:  None. FINDINGS: Frontal, bilateral oblique, and cross-table lateral views of the left knee are obtained. No acute fracture, subluxation, or dislocation. There is mild 3 compartmental osteoarthritis. Large joint effusion. Soft tissues are otherwise unremarkable. IMPRESSION: 1. Large suprapatellar joint effusion. 2. Mild 3 compartmental osteoarthritis. 3. No acute fracture. Electronically Signed   By: Randa Ngo M.D.   On: 07/02/2021 19:50  ? ?MR ABDOMEN MRCP W WO CONTAST ? ?Result Date: 07/01/2021 ?CLINICAL DATA:  Nausea and vomiting. Jaundice. Elevated liver function tests. Biliary duct dilatation. History of diabetes. Alcohol withdrawal. Splenic vein thrombosis. Gastroesophageal reflux disease. EXAM: MRI ABDOMEN WITHOUT AND WITH CONTRAST (INCLUDING MRCP) TECHNIQUE: Multiplanar multisequence MR imaging of the abdomen was performed both before and after the administration  of intravenous contrast. Heavily T2-weighted images of the biliary and pancreatic ducts were obtained, and three-dimensional MRCP images were rendered by post processing. CONTRAST:  7.3mL GADAVIST GADOBUTROL 1 MMOL/ML IV SOLN COMPARISON:  Right upper quadrant ultrasound 06/29/2021. CT 06/24/2021. FINDINGS: Portions of exam are  moderately motion degraded. Lower chest: Moderate hiatal hernia. Trace, right larger than left pleural effusions. Normal heart size. Hepatobiliary: No cirrhosis or focal liver lesion. Multiple gallstones including up to 8 mm. No specific evidence of acute cholecystitis. Borderline to mild gallbladder distension at 9.3 cm. No intrahepatic biliary duct dilatation. Although the common duct is normal in caliber for age at 7 mm on 25/3,a distal common duct stone measures on the order of 7 mm, including on 22/4, 23/3. Suspect smaller dependent stones within the more proximal common duct including on 18/4 and 82/6. Of note, MRCP images are significantly motion degraded. Pancreas:  Markedly atrophic. Spleen:  Normal in size, without focal abnormality. Adrenals/Urinary Tract: Normal adrenal glands. Mild renal cortical thinning bilaterally. Bilateral small renal cysts, without suspicious renal lesion or hydronephrosis. Stomach/Bowel: Normal distal most stomach and abdominal bowel loops. Vascular/Lymphatic: Aortic atherosclerosis. Interval development of left portal vein thrombus, including on 45/1102. Concurrent edema including on 12/05. The splenic vein is insufficient and likely chronically thrombosed with resultant collaterals in a gastroepiploic distribution. No abdominal adenopathy. Other:  No ascites. Musculoskeletal: No acute osseous abnormality. IMPRESSION: 1. Moderately motion degraded exam. 2. Choledocholithiasis. Distal common duct stone of approximately 7 mm. Possible smaller more proximal common duct stones. No significant biliary duct dilatation. 3. Development of left portal vein thrombosis since 06/23/2021 CT. 4. Cholelithiasis without acute cholecystitis. Borderline to mild gallbladder distension is chronic. 5. Pancreatic atrophy, likely related to prior episodes of pancreatitis. 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 These results will be called to the ordering clinician or  representative by the Radiologist Assistant, and communication documented in the PACS or [employer]. Electronically Signed   By: Abigail Miyamoto M.D.   On: 07/01/2021 18:48   ? ? ? ? Assessment / Plan:   ? ?#56 87 year old white male initially admitted at [facility] on 06/23/2021 with abdominal pain nausea vomiting.  Found to have acute cholangitis/E. coli bacteremia, and cholelithiasis. ? ?He is currently on Zosyn, ?MRCP since transfer here did reveal choledocholithiasis with a distal common bile duct stone approximately 7 mm and possibly smaller filling defects in the proximal duct ? ?LFTs have continued to trend down ? ?#2 history of splenic vein thrombosis-been on Eliquis as an outpatient ?#3 history of EtOH abuse-no evidence of cirrhosis by imaging ?#4 diarrhea-Flexi-Seal in place ?Likely antibiotic related, rule out C. Difficile ? ?Plan; patient is for ERCP this afternoon with Dr. Lyndel Safe ?Continue Zosyn ?Stool for C. difficile ? ? ? ? ?Principal Problem: ?  Intractable nausea and vomiting ?Active Problems: ?  Thrombocytopenia ([place]) ?  GERD (gastroesophageal reflux disease) ?  Sepsis from E coli bacteremia  ?  Hypomagnesemia ?  Lactic acidosis ?  Hypercalcemia ?  Transaminitis ?  GI bleed ?  AKI (acute kidney injury) ([place]) ?  Hypophosphatemia ?  Ascending cholangitis with severe sepsis ?  Chronic anticoagulation ?  Calculus of gallbladder with acute cholecystitis and obstruction ?  Hyperbilirubinemia ?  Hypokalemia ? ? ? ? LOS: 9 days  ? ?Danford Tat PA-C 07/03/2021, 10:19 AM ?  ?

## 2021-07-03 NOTE — Interval H&P Note (Signed)
History and Physical Interval Note: ? ?07/03/2021 ?3:16 PM ? ?Jon Bennett  has presented today for surgery, with the diagnosis of cholangitis, CBD stones.  The various methods of treatment have been discussed with the patient and family. After consideration of risks, benefits and other options for treatment, the patient has consented to  Procedure(s): ?ENDOSCOPIC RETROGRADE CHOLANGIOPANCREATOGRAPHY (ERCP) (N/A) as a surgical intervention.  The patient's history has been reviewed, patient examined, no change in status, stable for surgery.  I have reviewed the patient's chart and labs.  Questions were answered to the patient's satisfaction.   ? ? ?Lynann Bologna ? ? ?

## 2021-07-03 NOTE — Op Note (Signed)
[facility] ?Patient Name: Jon Bennett ?Procedure Date : 07/03/2021 ?MRN: [id number] ?Attending MD: Jackquline Denmark , MD ?Date of Birth: 1934-04-13 ?CSN: [id number] ?Age: 87 ?Admit Type: Inpatient ?Procedure:                ERCP ?Indications:              Bile duct stone(s) on MRCP. Recent ascending  ?                          cholangitis, better with Zosyn ?Providers:                Jackquline Denmark, MD, Dulcy Fanny, [facility],  ?                          Technician ?Referring MD:              ?Medicines:                General Anesthesia ?Complications:            No immediate complications. ?Estimated Blood Loss:     Estimated blood loss was minimal. ?Procedure:                Pre-Anesthesia Assessment: ?                          - Prior to the procedure, a History and Physical  ?                          was performed, and patient medications and  ?                          allergies were reviewed. The patient's tolerance of  ?                          previous anesthesia was also reviewed. The risks  ?                          and benefits of the procedure and the sedation  ?                          options and risks were discussed with the patient.  ?                          All questions were answered, and informed consent  ?                          was obtained. Prior Anticoagulants: The patient has  ?                          taken Eliquis (apixaban), last dose was 7 days  ?                          prior to procedure. ASA Grade Assessment: III - A  ?  patient with severe systemic disease. After  ?                          reviewing the risks and benefits, the patient was  ?                          deemed in satisfactory condition to undergo the  ?                          procedure. ?                          After obtaining informed consent, the scope was  ?                          passed under direct vision. Throughout the  ?                          procedure,  the patient's blood pressure, pulse, and  ?                          oxygen saturations were monitored continuously. The  ?                          [id number]-[id number] ([id number]) Olympus duodenoscope was  ?                          introduced through the mouth, and used to inject  ?                          contrast into and used to inject contrast into the  ?                          bile duct. The ERCP was unusually difficult due to  ?                          challenging cannulation because of abnormal  ?                          anatomy. Initially intubation was performed the  ?                          left lateral position, thereafter the position was  ?                          changed to prone position and scope reinserted. The  ?                          patient tolerated the procedure well. ?Scope In: ?Scope Out: ?Findings: ?     The scout film was normal. The ERCP scope was passed with some  ?     difficulty to the second portion of the duodenum d/t moderate size  ?     hiatal hernia. ?     The major papilla was located entirely  within a large diverticulum  ?     towards the right rim. This distorted the anatomy of CBD significantly  ?     and gave angulated course. The CBD was mildly dilated at 8 mm with  ?     multiple filling defects. A stone was visualized impacting lower end of  ?     CBD. A 10 mm biliary sphincterotomy was made with a monofilament  ?     traction (standard) sphincterotome using ERBE electrocautery on endocut  ?     mode. Minor oozing was noted which stopped on its own. A small black  ?     stone spontaneously passed after sphincterotomy. The biliary tree was  ?     swept with a 10 mm balloon starting at the middle third of the main bile  ?     duct. Multiple black and brown stones (about 5) were swept from the duct  ?     along with sludge. See endoscopic pictures. Multiple filling defects  ?     were also noted in the gallbladder consistent with cholelithiasis. ?     Postocclusion  cholangiogram did not reveal any obvious residual stones.  ?     However, few air bubbles were noted. The wire and balloon got dislodged  ?     towards the end of postocclusion cholangiogram. We did not reinsert the  ?     sphincterotome. Bile was green. No pus. ?     Pancreatic duct was not intentionally cannulated or injected. Indocin  ?     suppositories were given ?Impression:               - The major papilla was located entirely within a  ?                          diverticulum. ?                          - Choledocholithiasis s/p biliary sphincterotomy  ?                          with stone extraction. ?                          - Cholelithiasis with patent cystic duct ?                          - Moderate size hiatal hernia. ?Recommendation:           - Return patient to hospital ward for ongoing care. ?                          - Watch for pancreatitis, bleeding, perforation,  ?                          and cholangitis. ?                          - Clear liquid diet today. Then advance gradually  ?                          to low-fat diet in AM ?                          -  I do recommend cholecystectomy. ?                          - Patient is at a higher risk for recurrent  ?                          choledocholithiasis d/t anatomy and diverticulum ?                          - Trend CBC, LFTs. ?                          - Continue antibiotics x 5 days more. ?                          - Timing of Eliquis to be determined by surgery.  ?                          From GI standpoint, at least 72 hours after  ?                          sphincterotomy. ?                          - He should follow-up in GI clinic on discharge in  ?                          4 to 6 weeks ?                          - The findings and recommendations were discussed  ?                          with the patient's family. ?Procedure Code(s):        --- Professional --- ?                          [phone], Endoscopic retrograde  ?                           cholangiopancreatography (ERCP); with removal of  ?                          calculi/debris from biliary/pancreatic duct(s) ?                          [id number], Endoscopic retrograde  ?                          cholangiopancreatography (ERCP); with  ?                          sphincterotomy/papillotomy ?Diagnosis Code(s):        --- Professional --- ?                          K80.50, Calculus of bile duct without cholangitis  ?  or cholecystitis without obstruction ?CPT copyright 2019 American Medical Association. All rights reserved. ?The codes documented in this report are preliminary and upon coder review may  ?be revised to meet current compliance requirements. ?Jackquline Denmark, MD ?07/03/2021 6:01:35 PM ?This report has been signed electronically. ?Number of Addenda: 0 ?

## 2021-07-03 NOTE — Transfer of Care (Signed)
Immediate Anesthesia Transfer of Care Note ? ?Patient: Jon Bennett ? ?Procedure(s) Performed: ENDOSCOPIC RETROGRADE CHOLANGIOPANCREATOGRAPHY (ERCP) ?SPHINCTEROTOMY ?REMOVAL OF STONES ? ?Patient Location: PACU ? ?Anesthesia Type:General ? ?Level of Consciousness: drowsy, patient cooperative and responds to stimulation ? ?Airway & Oxygen Therapy: Patient Spontanous Breathing ? ?Post-op Assessment: Report given to RN, Post -op Vital signs reviewed and stable and Patient moving all extremities X 4 ? ?Post vital signs: Reviewed and stable ? ?Last Vitals:  ?Vitals Value Taken Time  ?BP 122/59 07/03/21 1745  ?Temp    ?Pulse 92 07/03/21 1747  ?Resp 27 07/03/21 1747  ?SpO2 95 % 07/03/21 1747  ?Vitals shown include unvalidated device data. ? ?Last Pain:  ?Vitals:  ? 07/03/21 1441  ?TempSrc: Temporal  ?PainSc: 0-No pain  ?   ? ?Patients Stated Pain Goal: 4 (07/02/21 1643) ? ?Complications: No notable events documented. ?

## 2021-07-03 NOTE — NC FL2 (Signed)
?[place] MEDICAID FL2 LEVEL OF CARE SCREENING TOOL  ?  ? ?IDENTIFICATION  ?Patient Name: ?Jon Bennett Birthdate: 15-Jul-1934 Sex: male Admission Date (Current Location): ?06/23/2021  ?[place] Number: ? [place] ?  Facility and Address:  ?[facility], [address], [place], [place] [address] ?     Provider Number: ?[id number]  ?Attending Physician Name and Address:  ?Elgergawy, Leana Roe, MD ? Relative Name and Phone Number:  ?Yamir Carignan [facility]) [phone] ?   ?Current Level of Care: ?Hospital Recommended Level of Care: ?Skilled Nursing Facility Prior Approval Number: ?  ? ?Date Approved/Denied: ?  PASRR Number: ?[id number] ? ?Discharge Plan: ?SNF ?  ? ?Current Diagnoses: ?Patient Active Problem List  ? Diagnosis Date Noted  ? Hypokalemia 06/29/2021  ? Hyperbilirubinemia 06/27/2021  ? Calculus of gallbladder with acute cholecystitis and obstruction   ? Intractable nausea and vomiting 06/24/2021  ? Sepsis from E coli bacteremia  06/24/2021  ? Hypomagnesemia 06/24/2021  ? Lactic acidosis 06/24/2021  ? Hypercalcemia 06/24/2021  ? Transaminitis 06/24/2021  ? GI bleed 06/24/2021  ? AKI (acute kidney injury) (HCC) 06/24/2021  ? Hypophosphatemia 06/24/2021  ? Ascending cholangitis with severe sepsis 06/24/2021  ? Chronic anticoagulation 06/24/2021  ? Hyponatremia 08/24/2020  ? 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 Dyskinesia   ? Absolute anemia   ? Akathisia 06/29/2019  ? Nausea and vomiting 06/29/2019  ? Acathisia 06/29/2019  ? Acute hyperactive alcohol withdrawal delirium (HCC) 04/24/2019  ? Type 2 diabetes mellitus (HCC)   ? Hypertension   ? GERD (gastroesophageal reflux disease)   ? Thrombocytopenia (HCC) 08/16/2018  ? Alcohol withdrawal (HCC) 08/13/2018  ? ? ?Orientation RESPIRATION BLADDER Height & Weight   ?  ?Self ? Normal Incontinent, External catheter Weight: 156 lb 12 oz (71.1 kg) ?Height:  5' 7.5" (171.5 cm)  ?BEHAVIORAL SYMPTOMS/MOOD NEUROLOGICAL BOWEL NUTRITION  STATUS  ?    Continent  (See Dc Summary)  ?AMBULATORY STATUS COMMUNICATION OF NEEDS Skin   ?Extensive Assist Verbally Normal ?  ?  ?  ?    ?     ?     ? ? ?Personal Care Assistance Level of Assistance  ?Bathing, Feeding, Dressing Bathing Assistance: Maximum assistance ?Feeding assistance: Limited assistance ?Dressing Assistance: Maximum assistance ?   ? ?Functional Limitations Info  ?Sight, Hearing, Speech Sight Info: Adequate ?Hearing Info: Adequate ?Speech Info: Adequate  ? ? ?SPECIAL CARE FACTORS FREQUENCY  ?PT (By licensed PT), OT (By licensed OT)   ?  ?PT Frequency: 5x per week ?OT Frequency: 5x per week ?  ?  ?  ?   ? ? ?Contractures Contractures Info: Not present  ? ? ?Additional Factors Info  ?Code Status, Allergies Code Status Info: DNR ?Allergies Info: Asa (Aspirin) ?  ?  ?  ?   ? ?Current Medications (07/03/2021):  This is the current hospital active medication list ?Current Facility-Administered Medications  ?Medication Dose Route Frequency Provider Last Rate Last Admin  ? 0.9 %  sodium chloride infusion   Intravenous Continuous Laural Benes, Clanford L, MD 50 mL/hr at 07/03/21 1232 New Bag at 07/03/21 1232  ? [MAR Hold] acetaminophen (TYLENOL) tablet 650 mg  650 mg Oral Q6H PRN Laural Benes, Clanford L, MD   650 mg at 07/02/21 1643  ? [MAR Hold] bupivacaine(PF) (MARCAINE) 0.5 % injection 10 mL  10 mL Infiltration Once Freeman Caldron, PA-C      ? [MAR Hold] Chlorhexidine Gluconate Cloth 2 % PADS 6 each  6 each Topical Daily Cleora Fleet, MD   6 each at 07/03/21 2542  ? [MAR Hold] diclofenac suppository 100 mg  100 mg Rectal Once Esterwood, Amy S, PA-C      ? [MAR Hold] diphenhydrAMINE (BENADRYL) injection 25 mg  25 mg Intravenous Q6H PRN Adefeso, Oladapo, DO   25 mg at 06/24/21 1534  ? [MAR Hold] feeding supplement (ENSURE ENLIVE / ENSURE PLUS) liquid 237 mL  237 mL Oral TID BM Elgergawy, Leana Roe, MD   237 mL at 07/02/21 1955  ? [MAR Hold] folic acid (FOLVITE) tablet 1 mg  1 mg Oral Daily Johnson,  Clanford L, MD   1 mg at 07/02/21 0849  ? lactated ringers infusion   Intravenous Continuous Molli Barrows, MD      ? [facility] Hold] lidocaine (LIDODERM) 5 % 1 patch  1 patch Transdermal Q24H Zierle-Ghosh, Asia B, DO   1 patch at 07/02/21 2120  ? [facility] Hold] MEDLINE mouth rinse  15 mL Mouth Rinse BID Laural Benes, Clanford L, MD   15 mL at 07/02/21 2135  ? [MAR Hold] melatonin tablet 6 mg  6 mg Oral QHS Zierle-Ghosh, Asia B, DO   6 mg at 07/02/21 2119  ? [MAR Hold] methylPREDNISolone acetate (DEPO-MEDROL) injection 40 mg  40 mg Intra-articular Once Freeman Caldron, PA-C      ? [MAR Hold] metoCLOPramide (REGLAN) injection 10 mg  10 mg Intravenous Q6H PRN Adefeso, Oladapo, DO   10 mg at 06/24/21 1536  ? [MAR Hold] multivitamin with minerals tablet 1 tablet  1 tablet Oral Daily Laural Benes, Clanford L, MD   1 tablet at 07/02/21 0848  ? [MAR Hold] oxyCODONE (Oxy IR/ROXICODONE) immediate release tablet 5 mg  5 mg Oral Q6H PRN Laural Benes, Clanford L, MD   5 mg at 07/02/21 1241  ? [MAR Hold] pantoprazole (PROTONIX) injection 40 mg  40 mg Intravenous Q12H Letta Median, PA-C   40 mg at 07/03/21 7062  ? [MAR Hold] piperacillin-tazobactam (ZOSYN) IVPB 3.375 g  3.375 g Intravenous Q8H Franky Macho, MD 12.5 mL/hr at 07/03/21 1231 3.375 g at 07/03/21 1231  ? [MAR Hold] prochlorperazine (COMPAZINE) tablet 10 mg  10 mg Oral Q6H PRN Adefeso, Oladapo, DO   10 mg at 06/26/21 2245  ? [MAR Hold] sodium chloride flush (NS) 0.9 % injection 10-40 mL  10-40 mL Intracatheter PRN Elgergawy, Leana Roe, MD      ? [facility] Hold] thiamine tablet 100 mg  100 mg Oral Daily Johnson, Clanford L, MD   100 mg at 07/02/21 0849  ? Or  ? [MAR Hold] thiamine (B-1) injection 100 mg  100 mg Intravenous Daily Johnson, Clanford L, MD   100 mg at 07/03/21 3762  ? ? ? ?Discharge Medications: ?Please see discharge summary for a list of discharge medications. ? ?Relevant Imaging Results: ? ?Relevant Lab Results: ? ? ?Additional Information ?SS# [id number]. Moderna  COVID-19 Vaccine 06/20/2019 , 05/17/2019 ? ?Renne Crigler Wyett Narine, LCSW ? ? ? ? ?

## 2021-07-03 NOTE — Evaluation (Signed)
Occupational Therapy Evaluation ?Patient Details ?Name: Jon Bennett ?MRN: [id number] ?DOB: 1935-01-16 ?Today's Date: 07/03/2021 ? ? ?History of Present Illness 87 y.o. male who presents to the emergency department due to several days of onset of nausea and vomiting and increased urinary frequency. +ascending cholangitis; transfer to [facility] from [facility] 4/19; +encephalopathy;  PMH significant of type 2 diabetes mellitus, hypertension, splenic vein thrombosis, alcohol withdrawal, GERD  ? ?Clinical Impression ?  ?Pt admitted with the above diagnosis and the deficits outlined below. Pt would benefit from cont OT to increase independence with basic adls and adl transfers so he can return home to live with his son. Pt is very limited today with L knee  pain not allowing pt to mobilize.  Will recommend SNF at this time due to pt's immobility but if knee pain subsides, feel pt may be able to go home with HHOT.  Will continue with efforts to get pt up walking. ? ?   ? ?Recommendations for follow up therapy are one component of a multi-disciplinary discharge planning process, led by the attending physician.  Recommendations may be updated based on patient status, additional functional criteria and insurance authorization.  ? ?Follow Up Recommendations ? Skilled nursing-short term rehab (<3 hours/day)  ?  ?Assistance Recommended at Discharge Frequent or constant Supervision/Assistance  ?Patient can return home with the following A lot of help with walking and/or transfers;A lot of help with bathing/dressing/bathroom;Assistance with cooking/housework;Help with stairs or ramp for entrance ? ?  ?Functional Status Assessment ? Patient has had a recent decline in their functional status and demonstrates the ability to make significant improvements in function in a reasonable and predictable amount of time.  ?Equipment Recommendations ? BSC/3in1  ?  ?Recommendations for Other Services   ? ? ?  ?Precautions / Restrictions  Precautions ?Precautions: Fall;Other (comment) (L knee pain) ?Precaution Comments: painful, swollen left knee ?Restrictions ?Weight Bearing Restrictions: No  ? ?  ? ?Mobility Bed Mobility ?Overal bed mobility: Needs Assistance ?Bed Mobility: Rolling ?Rolling: Max assist ?  ?  ?  ?  ?General bed mobility comments: only tolerated ~1/2 roll to right with pt using rail (limited by left knee pain); attempted supine to sit with HOB elevated and pt could not tolerate moving LLE toward/over EOB; scoot to HOB +2 total assist ?  ? ?Transfers ?  ?  ?  ?  ?  ?  ?  ?  ?  ?General transfer comment: unable due to left knee pain and could not get to sitting EOB ?  ? ?  ?Balance   ?  ?  ?  ?  ?  ?  ?  ?  ?  ?  ?  ?  ?  ?  ?  ?  ?  ?  ?   ? ?ADL either performed or assessed with clinical judgement  ? ?ADL Overall ADL's : Needs assistance/impaired ?Eating/Feeding: Independent;Sitting ?  ?Grooming: Wash/dry hands;Wash/dry face;Oral care;Set up;Sitting ?  ?Upper Body Bathing: Set up;Sitting ?  ?Lower Body Bathing: Moderate assistance;Bed level ?Lower Body Bathing Details (indicate cue type and reason): bed level due to knee pain ?Upper Body Dressing : Set up;Sitting ?  ?Lower Body Dressing: Maximal assistance;Bed level ?Lower Body Dressing Details (indicate cue type and reason): due to L knee pain ?  ?Toilet Transfer Details (indicate cue type and reason): Pt unable to transfer at this time due to L knee pain with any movement. ?Toileting- Clothing Manipulation and Hygiene: Bed level;Maximal assistance ?  ?  ?  Tub/Shower Transfer Details (indicate cue type and reason): uanble ?Functional mobility during ADLs: Moderate assistance ?General ADL Comments: Pt's biggest limitation is inabiilty to move bc of L knee pain. Pt to go for stone removal surgery today and had shot in knee today. Will encourage mobilization despite pain after procedure.  ? ? ? ?Vision Baseline Vision/History: 0 No visual deficits ?Ability to See in Adequate Light: 0  Adequate ?Patient Visual Report: No change from baseline ?Vision Assessment?: No apparent visual deficits  ?   ?Perception   ?  ?Praxis   ?  ? ?Pertinent Vitals/Pain Pain Assessment ?Pain Assessment: Faces ?Faces Pain Scale: Hurts whole lot ?Pain Location: left knee ?Pain Descriptors / Indicators: Discomfort, Grimacing, Guarding ?Pain Intervention(s): Limited activity within patient's tolerance, Monitored during session, Premedicated before session, Repositioned  ? ? ? ?Hand Dominance Left ?  ?Extremity/Trunk Assessment Upper Extremity Assessment ?Upper Extremity Assessment: Overall WFL for tasks assessed ?  ?Lower Extremity Assessment ?Lower Extremity Assessment: Defer to PT evaluation ?  ?Cervical / Trunk Assessment ?Cervical / Trunk Assessment: Kyphotic ?  ?Communication Communication ?Communication: No difficulties ?  ?Cognition Arousal/Alertness: Awake/alert ?Behavior During Therapy: [facility] for tasks assessed/performed ?Overall Cognitive Status: No family/caregiver present to determine baseline cognitive functioning ?  ?  ?  ?  ?  ?  ?  ?  ?  ?  ?  ?  ?  ?  ?  ?  ?General Comments: Pt oriented to place, self, date, and sitation today.  Described home set up and living situation.  Will check accuracy with family. ?  ?  ?General Comments  Pt moves well overall outside of L knee which doesnt allow pt to get up at this time. ? ?  ?Exercises   ?  ?Shoulder Instructions    ? ? ?Home Living Family/patient expects to be discharged to:: Private residence ?Living Arrangements: Children ?Available Help at Discharge: Family;Available 24 hours/day ?Type of Home: House ?Home Access: Stairs to enter;Ramped entrance ?  ?  ?Home Layout: Able to live on main level with bedroom/bathroom;Full bath on main level;Two level ?  ?  ?Bathroom Shower/Tub: Tub/shower unit;Walk-in shower ?  ?Bathroom Toilet: Standard ?  ?  ?Home Equipment: Agricultural consultantolling Walker (2 wheels) ?  ?Additional Comments: Pt states wife died appx 3 years ago and has walker  from her.  Seemed more appropriate today ?  ? ?  ?Prior Functioning/Environment Prior Level of Function : Independent/Modified Independent ?  ?  ?  ?  ?  ?  ?Mobility Comments: walked without assistive device PTA ?ADLs Comments: Pt states he is independent with all adls except cooking. Pt states he drives ?  ? ?  ?  ?OT Problem List: Decreased activity tolerance;Impaired balance (sitting and/or standing);Decreased knowledge of use of DME or AE;Pain ?  ?   ?OT Treatment/Interventions: Self-care/ADL training;Balance training;Therapeutic activities  ?  ?OT Goals(Current goals can be found in the care plan section) Acute Rehab OT Goals ?Patient Stated Goal: to get rid of knee pain ?OT Goal Formulation: With patient ?Time For Goal Achievement: 07/17/21 ?Potential to Achieve Goals: Good ?ADL Goals ?Pt Will Perform Grooming: with supervision;standing ?Pt Will Perform Lower Body Bathing: sit to/from stand;with supervision ?Pt Will Perform Lower Body Dressing: with supervision;sit to/from stand ?Pt Will Transfer to Toilet: with supervision;ambulating;bedside commode;regular height toilet;grab bars ?Pt Will Perform Toileting - Clothing Manipulation and hygiene: with supervision;sit to/from stand ?Pt Will Perform Tub/Shower Transfer: Shower transfer;shower seat;ambulating;with supervision;rolling walker  ?OT Frequency: Min 2X/week ?  ? ?  Co-evaluation   ?  ?  ?  ?  ? ?  ?AM-PAC OT "6 Clicks" Daily Activity     ?Outcome Measure Help from another person eating meals?: None ?Help from another person taking care of personal grooming?: None ?Help from another person toileting, which includes using toliet, bedpan, or urinal?: A Lot ?Help from another person bathing (including washing, rinsing, drying)?: A Lot ?Help from another person to put on and taking off regular upper body clothing?: A Lot ?Help from another person to put on and taking off regular lower body clothing?: A Lot ?6 Click Score: 16 ?  ?End of Session Nurse  Communication: Mobility status ? ?Activity Tolerance: Patient limited by pain ?Patient left: in chair;with call bell/phone within reach ? ?OT Visit Diagnosis: Other abnormalities of gait and mobility (R26.89)  ?              ?Time: 1

## 2021-07-04 DIAGNOSIS — K8001 Calculus of gallbladder with acute cholecystitis with obstruction: Secondary | ICD-10-CM | POA: Diagnosis not present

## 2021-07-04 DIAGNOSIS — K8050 Calculus of bile duct without cholangitis or cholecystitis without obstruction: Secondary | ICD-10-CM | POA: Diagnosis not present

## 2021-07-04 DIAGNOSIS — Z7901 Long term (current) use of anticoagulants: Secondary | ICD-10-CM | POA: Diagnosis not present

## 2021-07-04 DIAGNOSIS — K8309 Other cholangitis: Secondary | ICD-10-CM | POA: Diagnosis not present

## 2021-07-04 DIAGNOSIS — L8990 Pressure ulcer of unspecified site, unspecified stage: Secondary | ICD-10-CM | POA: Insufficient documentation

## 2021-07-04 LAB — COMPREHENSIVE METABOLIC PANEL
ALT: 13 U/L (ref 0–44)
AST: 18 U/L (ref 15–41)
Albumin: 1.8 g/dL — ABNORMAL LOW (ref 3.5–5.0)
Alkaline Phosphatase: 140 U/L — ABNORMAL HIGH (ref 38–126)
Anion gap: 8 (ref 5–15)
BUN: 16 mg/dL (ref 8–23)
CO2: 24 mmol/L (ref 22–32)
Calcium: 8.2 mg/dL — ABNORMAL LOW (ref 8.9–10.3)
Chloride: 103 mmol/L (ref 98–111)
Creatinine, Ser: 0.84 mg/dL (ref 0.61–1.24)
GFR, Estimated: >60 mL/min (ref >60)
Glucose, Bld: 173 mg/dL — ABNORMAL HIGH (ref 70–99)
Potassium: 3.8 mmol/L (ref 3.5–5.1)
Sodium: 135 mmol/L (ref 135–145)
Total Bilirubin: 1.6 mg/dL — ABNORMAL HIGH (ref 0.3–1.2)
Total Protein: 5.2 g/dL — ABNORMAL LOW (ref 6.5–8.1)

## 2021-07-04 LAB — CBC
HCT: 24.8 % — ABNORMAL LOW (ref 39.0–52.0)
Hemoglobin: 8.5 g/dL — ABNORMAL LOW (ref 13.0–17.0)
MCH: 31.8 pg (ref 26.0–34.0)
MCHC: 34.3 g/dL (ref 30.0–36.0)
MCV: 92.9 fL (ref 80.0–100.0)
Platelets: 161 10*3/uL (ref 150–400)
RBC: 2.67 MIL/uL — ABNORMAL LOW (ref 4.22–5.81)
RDW: 15 % (ref 11.5–15.5)
WBC: 6.4 10*3/uL (ref 4.0–10.5)
nRBC: 0 % (ref 0.0–0.2)

## 2021-07-04 MED ORDER — MIDODRINE HCL 5 MG PO TABS
5.0000 mg | ORAL_TABLET | Freq: Two times a day (BID) | ORAL | Status: DC
  Administered 2021-07-04 – 2021-07-05 (×4): 5 mg via ORAL
  Filled 2021-07-04 (×6): qty 1

## 2021-07-04 MED ORDER — ALBUMIN HUMAN 25 % IV SOLN
25.0000 g | Freq: Four times a day (QID) | INTRAVENOUS | Status: AC
  Administered 2021-07-04 – 2021-07-05 (×4): 25 g via INTRAVENOUS
  Filled 2021-07-04 (×4): qty 100

## 2021-07-04 MED ORDER — HEPARIN SODIUM (PORCINE) 5000 UNIT/ML IJ SOLN
5000.0000 [IU] | Freq: Three times a day (TID) | INTRAMUSCULAR | Status: DC
  Administered 2021-07-04 – 2021-07-05 (×3): 5000 [IU] via SUBCUTANEOUS
  Filled 2021-07-04 (×3): qty 1

## 2021-07-04 NOTE — Anesthesia Postprocedure Evaluation (Signed)
Anesthesia Post Note ? ?Patient: MACARI ZALESKY ? ?Procedure(s) Performed: ENDOSCOPIC RETROGRADE CHOLANGIOPANCREATOGRAPHY (ERCP) ?SPHINCTEROTOMY ?REMOVAL OF STONES ? ?  ? ?Patient location during evaluation: PACU ?Anesthesia Type: General ?Level of consciousness: awake ?Pain management: pain level controlled ?Vital Signs Assessment: post-procedure vital signs reviewed and stable ?Respiratory status: spontaneous breathing, nonlabored ventilation, respiratory function stable and patient connected to nasal cannula oxygen ?Cardiovascular status: blood pressure returned to baseline and stable ?Postop Assessment: no apparent nausea or vomiting ?Anesthetic complications: no ? ? ?No notable events documented. ? ?Last Vitals:  ?Vitals:  ? 07/04/21 0300 07/04/21 0314  ?BP:  (!) 95/58  ?Pulse: 93 89  ?Resp: 20 17  ?Temp:  36.8 ?C  ?SpO2: 96% 96%  ?  ?Last Pain:  ?Vitals:  ? 07/04/21 0314  ?TempSrc: Oral  ?PainSc:   ? ? ?  ?  ?  ?  ?  ?  ? ?Sidhant Helderman P Makinze Jani ? ? ? ? ?

## 2021-07-04 NOTE — Progress Notes (Signed)
1 Day Post-Op  ? ?Subjective/Chief Complaint: ?C/o indigestion and belching but denies abdominal pain ? ? ?Objective: ?Vital signs in last 24 hours: ?Temp:  [97.6 ?F (36.4 ?C)-98.4 ?F (36.9 ?C)] 98.3 ?F (36.8 ?C) (04/22 0755) ?Pulse Rate:  [79-95] 90 (04/22 0755) ?Resp:  [17-25] 20 (04/22 0755) ?BP: (95-143)/(57-85) 114/85 (04/22 0755) ?SpO2:  [93 %-98 %] 96 % (04/22 0755) ?Last BM Date : 07/02/21 ? ?Intake/Output from previous day: ?04/21 0701 - 04/22 0700 ?In: 2519.2 [I.V.:2469.2; IV Piggyback:50] ?Out: 600 [Urine:600] ?Intake/Output this shift: ?No intake/output data recorded. ? ?A&O x 3, ?Unlabored respirations ?Abdomen soft, mildly distended, nontender. Well healed rlq appendectomy scar ? ?Lab Results:  ?Recent Labs  ?  07/03/21 ?0105 07/04/21 ?[id number]  ?WBC 8.4 6.4  ?HGB 8.7* 8.5*  ?HCT 25.6* 24.8*  ?PLT 136* 161  ? ?BMET ?Recent Labs  ?  07/03/21 ?0105 07/04/21 ?0412  ?NA 133* 135  ?K 3.5 3.8  ?CL 102 103  ?CO2 24 24  ?GLUCOSE 112* 173*  ?BUN 8 16  ?CREATININE 0.83 0.84  ?CALCIUM 8.2* 8.2*  ? ?PT/INR ?No results for input(s): LABPROT, INR in the last 72 hours. ?ABG ?No results for input(s): PHART, HCO3 in the last 72 hours. ? ?Invalid input(s): PCO2, PO2 ? ?Studies/Results: ?DG Chest Port 1 View ? ?Result Date: 07/02/2021 ?CLINICAL DATA:  PICC placement EXAM: PORTABLE CHEST 1 VIEW COMPARISON:  06/27/2021 FINDINGS: Single frontal view of the chest demonstrates interval placement of right-sided PICC, tip overlying the atriocaval junction. The cardiac silhouette is stable. Patchy consolidation has developed at the left lung base, which may reflect hypoventilatory change. No effusion or pneumothorax. Hiatal hernia unchanged. No acute bony abnormalities. IMPRESSION: 1. Right-sided PICC tip overlying atriocaval junction. 2. Hiatal hernia. 3. Developing consolidation at the left lung base, favor atelectasis. Electronically Signed   By: Randa Ngo M.D.   On: 07/02/2021 19:52  ? ?DG ERCP ? ?Result Date:  07/04/2021 ?CLINICAL DATA:  87 year old male with a history jaundice EXAM: ERCP TECHNIQUE: Multiple spot images obtained with the fluoroscopic device and submitted for interpretation post-procedure. FLUOROSCOPY: Radiation Exposure Index (as provided by the fluoroscopic device): 189.8 mGy Kerma COMPARISON:  None. FINDINGS: Limited intraoperative fluoroscopic spot images during ERCP. Initial image demonstrates endoscope projecting over the upper abdomen. Subsequently there is cannulation of the ampulla and retrograde infusion of contrast. Final image demonstrates partial opacification of the extrahepatic biliary ducts with endoscope and safety wire in place. IMPRESSION: Limited intraoperative fluoroscopic spot images during ERCP, as above. Please refer to the dictated operative report for full details of intraoperative findings and procedure. Electronically Signed   By: Corrie Mckusick D.O.   On: 07/04/2021 08:00  ? ?DG Knee Left Port ? ?Result Date: 07/02/2021 ?CLINICAL DATA:  Left knee edema EXAM: PORTABLE LEFT KNEE - 1-2 VIEW COMPARISON:  None. FINDINGS: Frontal, bilateral oblique, and cross-table lateral views of the left knee are obtained. No acute fracture, subluxation, or dislocation. There is mild 3 compartmental osteoarthritis. Large joint effusion. Soft tissues are otherwise unremarkable. IMPRESSION: 1. Large suprapatellar joint effusion. 2. Mild 3 compartmental osteoarthritis. 3. No acute fracture. Electronically Signed   By: Randa Ngo M.D.   On: 07/02/2021 19:50   ? ?Anti-infectives: ?Anti-infectives (From admission, onward)  ? ? Start     Dose/Rate Route Frequency Ordered Stop  ? 06/28/21 1215  piperacillin-tazobactam (ZOSYN) IVPB 3.375 g       ? 3.375 g ?12.5 mL/hr over 240 Minutes Intravenous Every 8 hours 06/28/21 1120    ?  06/27/21 1945  cefTRIAXone (ROCEPHIN) 2 g in sodium chloride 0.9 % 100 mL IVPB  Status:  Discontinued       ? 2 g ?200 mL/hr over 30 Minutes Intravenous Every 24 hours 06/27/21  1847 06/28/21 1120  ? 06/27/21 0800  ceFAZolin (ANCEF) IVPB 2g/100 mL premix  Status:  Discontinued       ? 2 g ?200 mL/hr over 30 Minutes Intravenous Every 8 hours 06/26/21 1520 06/27/21 1847  ? 06/25/21 1000  vancomycin (VANCOREADY) IVPB 1250 mg/250 mL  Status:  Discontinued       ? 1,250 mg ?166.7 mL/hr over 90 Minutes Intravenous Every 36 hours 06/23/21 2131 06/24/21 1537  ? 06/25/21 1000  cefTRIAXone (ROCEPHIN) 2 g in sodium chloride 0.9 % 100 mL IVPB  Status:  Discontinued       ? 2 g ?200 mL/hr over 30 Minutes Intravenous Every 24 hours 06/25/21 0909 06/26/21 1520  ? 06/24/21 1000  ceFEPIme (MAXIPIME) 2 g in sodium chloride 0.9 % 100 mL IVPB  Status:  Discontinued       ? 2 g ?200 mL/hr over 30 Minutes Intravenous Every 12 hours 06/23/21 2131 06/25/21 0909  ? 06/23/21 2045  vancomycin (VANCOREADY) IVPB 1250 mg/250 mL       ? 1,250 mg ?166.7 mL/hr over 90 Minutes Intravenous  Once 06/23/21 2037 06/23/21 2338  ? 06/23/21 2030  ceFEPIme (MAXIPIME) 2 g in sodium chloride 0.9 % 100 mL IVPB       ? 2 g ?200 mL/hr over 30 Minutes Intravenous  Once 06/23/21 2030 06/23/21 2129  ? 06/23/21 2030  metroNIDAZOLE (FLAGYL) IVPB 500 mg       ? 500 mg ?100 mL/hr over 60 Minutes Intravenous  Once 06/23/21 2030 06/23/21 2227  ? 06/23/21 2030  vancomycin (VANCOCIN) IVPB 1000 mg/200 mL premix  Status:  Discontinued       ? 1,000 mg ?200 mL/hr over 60 Minutes Intravenous  Once 06/23/21 2030 06/23/21 2037  ? ?  ? ? ?Assessment/Plan: ?s/p Procedure(s): ?ENDOSCOPIC RETROGRADE CHOLANGIOPANCREATOGRAPHY (ERCP) (N/A) ?SPHINCTEROTOMY ?REMOVAL OF STONES ?Patient fairly agreeable to proceeding cholecystectomy, but has just finished a full tray of clears for breakfast and wishes to confer with his son before deciding. Discussed risks of surgery including bleeding, pain, scarring, intraabdominal injury specifically to the common bile duct and sequelae, bile leak, conversion to open surgery, subtotal cholecystectomy, blood clot, pneumonia,  heart attack, stroke, failure to resolve symptoms, etc. Questions welcomed and answered. Will make npo after midnight in case we can proceed tomorrow.  ? LOS: 10 days  ? ? ?Shamekia Tippets Rich Brave ?07/04/2021 ? ?

## 2021-07-04 NOTE — Progress Notes (Signed)
?PROGRESS NOTE ? ? ? ?Jon Bennett  ZES:[id number] DOB: [birth year] DOA: 06/23/2021 ?PCP: [name], MD  ? ? ?Chief Complaint  ?Patient presents with  ? Vomiting  ? ? ?Brief Narrative:  ? ?87 y.o. male with medical history significant of type 2 diabetes mellitus, hypertension, splenic vein thrombosis, alcohol withdrawal, GERD who presents to the emergency department due to several days of onset of nausea and vomiting and increased urinary frequency.  He denies any abdominal pain, chest pain, headache, blurry vision, shortness of breath.  Patient states that he has abstained from alcohol for several months. Patient was seen in the ED on 3/23 due to alcoholic gastritis, he was treated in the ED and discharged home.  Apparently, patient had a pint of vodka about 4 days prior to the ED visit.  ?-He was noted to be febrile, tachypneic and tachycardic, work-up significant for ascending cholangitis, was admitted to [facility], but he was transferred to [facility] 07/01/2021 for further management per GI. ?    ?His blood cultures 4/11 positive for E coli. Pharmacy changed abx to ceftriaxone on 4/13.    ?  ?06/26/2021 : surgery planning for cholecystectomy in next couple of days. Narrowed antibiotics for E colic bacteremia to cefazolin starting on 4/14.   ?  ?06/27/2021: spike in bilirubin to 4.1 today.  GI planning MRCP on 4/17.  Timing of cholecystectomy to be determined after further testing.   Pt developed worsening sepsis, spiked fever, ammonia elevated, WBC and procalcitonin rising. Transferred to stepdown ICU.  Spoke with son and DNR order confirmed.  Repeated blood cultures. Broadened antibiotics.   ?  ?06/28/2021:  Worsening encephalopathy today, platelets down to 52.  WBC and procalcitonin rising.  Conferenced with GI and surgery.  Recommending ERCP but GI reports he is too complex to have done at [facility].  Ordered for transfer to [facility] or [facility] per GI. Dr. Marletta Lor reaching out to GI team in  [place].  I spoke with family if they wanted to proceed with ERCP.  I spoke with son Jon Bennett who is agreeable to ERCP understanding that there are significant risks involved.  He was agreeable to transfer to [facility].  ?  ?06/29/2021: pt still waiting for transfer to [facility] for ERCP.  Cancelled NPO order and allowed to eat.  Ammonia coming down.  I updated son Jon Bennett about situation with transfer. Pt clinically appears nontoxic.  Bilirubin remains elevated but slightly improved today.   ?  ?06/30/2021: bilirubin down to 2.6 today.  Encephalopathy improved and lactulose discontinued due to loose stools.   ? ?07/02/2021: MRCP significant for cholelithiasis, plan for ERCP  ? ?07/03/2021: Status post ERCP with Dr. Chales Abrahams showed: ?- The major papilla was located entirely within a diverticulum. ?- Choledocholithiasis s/p biliary sphincterotomy with stone extraction. ?- Cholelithiasis with patent cystic duct ?- Moderate size hiatal hernia. ? ? ?Assessment & Plan: ?  ?Principal Problem: ?  Intractable nausea and vomiting ?Active Problems: ?  Thrombocytopenia (HCC) ?  GERD (gastroesophageal reflux disease) ?  Sepsis from E coli bacteremia  ?  Hypomagnesemia ?  Lactic acidosis ?  Hypercalcemia ?  Transaminitis ?  GI bleed ?  AKI (acute kidney injury) (HCC) ?  Hypophosphatemia ?  Ascending cholangitis with severe sepsis ?  Chronic anticoagulation ?  Calculus of gallbladder with acute cholecystitis and obstruction ?  Hyperbilirubinemia ?  Hypokalemia ?  Pressure injury of skin ? ? ?Ascending cholangitis with severe sepsis ?-Enzymes appears to be  improving, [facility] showing no biliary dilation or choledocholithiasis. ?-Continue with IV Zosyn for another 5 days after ERCP 4/21 per GI recommendation ?- MRCP did show choledocholithiasis with a distal common duct stone approximately 7 mm and possibly smaller more proximal common duct stones without significant biliary dilatation.  LFTs  continue to trend down. ?-ERCP 4/21 status post biliary sphincterectomy with stone extraction ?-Surgery consulted, for lap cholecystectomy tomorrow. ?  ?Calculus of gallbladder with acute cholecystitis and obstruction ?-Plan for ERCP today, surgery consulted, plan for lap cholecystectomy tomorrow ? ?Sepsis from E coli bacteremia  ?-Due to above, continue with IV Zosyn. ? ?Intractable nausea and vomiting ?- due to above, resolved ?  ?Hypokalemia ?-- repleted  ?  ?Hyperbilirubinemia ?-Due to above, trending down ?  ?History of splenic vein thrombosis ?-Coagulation which has been held since admission anticipation for procedures and surgery ? 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 ? ?Hypophosphatemia ?-Repleted ?  ?AKI (acute kidney injury) (HCC) ?-Resolved with hydration ?  ? GI bleed ?--appreciate GI consultation, EGD 4/13 with findings of small mallory weiss tear ?--continue protonix as ordered for GI protection ?-Continue to monitor CBC closely and transfuse as needed ?  ?Hypercalcemia ?--resolved after hydration with IV fluid ?  ?Lactic acidosis ?-- resolved with IV fluid ?  ?Hypomagnesemia ?- repleted  ? ?GERD (gastroesophageal reflux disease) ?--protonix for GI protection  ?  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 ?Hyponatremia ?-Improving ? ?Left Knee effusion ?-Patient complains of pain, left knee is swollen, tender to palpation, has significantly improved over last 48 hours, Ortho input greatly appreciated, at this point no indication for arthrocentesis, this can be reconsidered if pain/swelling or reoccurs.   ? ? ?  ? ? ?DVT prophylaxis: [place] Heparin, resume Eliquis once cleared by GI and general surgery ?Code Status: DNR ?Family  Communication: None at bedside, D/W son Rosanne AshingJim by phone, left [employer] a voicemail. ?Disposition:  ? ?Status is: Inpatient ? ?  ?Consultants:  ?GI  ?Palliaitve ?General surgery  ?orthopedic ? ? ?Subjective: ? ?No abdominal pain, no nausea, no vomiting, reports left knee pain has resolved ? ?Objective: ?Vitals:  ? 07/04/21 0300 07/04/21 0314 07/04/21 0755 07/04/21 1202  ?BP:  (!) 95/58 114/85 106/78  ?Pulse: 93 89 90 85  ?Resp: 20 17 20  (!) 21  ?Temp:  98.3 ?F (36.8 ?C) 98.3 ?F (36.8 ?C) 98.3 ?F (36.8 ?C)  ?TempSrc:  Oral Oral Oral  ?SpO2: 96% 96% 96% 96%  ?Weight:      ?Height:      ? ? ?Intake/Output Summary (Last 24 hours) at 07/04/2021 1238 ?Last data filed at 07/04/2021 0300 ?Gross per 24 hour  ?Intake 2519.23 ml  ?Output 600 ml  ?Net 1919.23 ml  ? ? ?Filed Weights  ? 06/28/21 0500 06/29/21 0500 06/30/21 0500  ?Weight: 69.3 kg 71.1 kg 71.1 kg  ? ? ?Examination: ? ?Awake Alert, Oriented X 3, frail, chronically ill-appearing, deconditioned ?Symmetrical Chest wall movement, Good air movement bilaterally, CTAB ?RRR,No Gallops,Rubs or new Murmurs, No Parasternal Heave ?+ve B.Sounds, Abd Soft, No tenderness, No rebound - guarding or rigidity. ?No Cyanosis, Clubbing or edema, No new Rash or bruise, left knee swelling has significantly subsided ? ? ? ? ?Data Reviewed: I have personally reviewed following labs  and imaging studies ? ?CBC: ?Recent Labs  ?Lab 06/28/21 ?0405 06/29/21 ?3818 06/30/21 ?2993 07/02/21 ?7169 07/03/21 ?0105 07/04/21 ?6789  ?WBC 11.6* 7.5 5.2 8.1 8.4 6.4  ?NEUTROABS 9.8* 6.4 4.1  --   --   --   ?HGB 10.5* 9.6* 9.3* 8.8* 8.7* 8.5*  ?HCT 31.8* 27.7* 26.0* 26.4* 25.6* 24.8*  ?MCV 91.6 91.1 89.3 92.0 91.8 92.9  ?PLT 52* 62* 77* 117* 136* 161  ? ? ? ?Basic Metabolic Panel: ?Recent Labs  ?Lab 06/29/21 ?0331 06/30/21 ?3810 07/01/21 ?1110 07/02/21 ?1751 07/03/21 ?0105 07/04/21 ?0258  ?NA 136 136  --  128* 133* 135  ?K 3.4* 3.4*  --  3.5 3.5 3.8  ?CL 108 109  --  99 102 103  ?CO2 22 21*  --  20* 24 24  ?GLUCOSE 106*  114*  --  112* 112* 173*  ?BUN 12 10  --  6* 8 16  ?CREATININE 0.61 0.71  --  0.77 0.83 0.84  ?CALCIUM 7.9* 7.8*  --  7.9* 8.2* 8.2*  ?MG 1.9 1.9 1.7  --   --   --   ? ? ? ?GFR: ?Estimated Creatinine Clearance: 62.3

## 2021-07-04 NOTE — Consult Note (Signed)
Orthopaedic Trauma Service (OTS) Consult  ? ?Patient ID: ?Jon Bennett ?MRN: [id number] ?DOB/AGE: 12-12-1934 87 y.o. ? ?Reason for Consult: Left knee effusion ?Referring Physician: Huey Bienenstock, MD ? ?HPI: Jon Bennett is an 87 y.o. male  with medical history significant of type 2 diabetes mellitus, hypertension, splenic vein thrombosis, alcohol withdrawal, GERD being seen in consultation at the request of Dr. Randol Kern for evaluation of left knee effusion.  Patient noted to have swollen tender left knee on physical exam 07/03/2021.  Orthopedics was consulted for evaluation and management of possible aspiration/arthrocentesis. ? ?I evaluated patient this morning on 5W.  Notes that his knee pain is much improved.  Notes that the swelling has also improved significantly from yesterday.  Is moving the knee much better.  Patient does not feel that he needs any intervention at this time.  Denies any fever or chills.  Other than some known osteoarthritis in the knee, patient with no previous injury or surgery to the left lower extremity that he is aware of ? ?Past Medical History:  ?Diagnosis Date  ? Diabetes mellitus without complication (HCC)   ? Patient is not on any medication for this  ? GERD (gastroesophageal reflux disease)   ? Hypertension   ? Pancreatitis, alcoholic, acute   ? Splenic vein thrombosis   ? ? ?Past Surgical History:  ?Procedure Laterality Date  ? APPENDECTOMY    ? ESOPHAGOGASTRODUODENOSCOPY (EGD) WITH PROPOFOL N/A 06/25/2021  ? Procedure: ESOPHAGOGASTRODUODENOSCOPY (EGD) WITH PROPOFOL;  Surgeon: Malissa Hippo, MD;  Location: [facility];  Service: Endoscopy;  Laterality: N/A;  ? VASCULAR SURGERY    ? ? ?Family History  ?Problem Relation Age of Onset  ? Hypertension Other   ? Heart disease Father   ? Healthy Sister   ? Healthy Brother   ? Cancer Maternal Grandfather   ? Healthy Brother   ? Healthy Brother   ? Healthy Sister   ? Healthy Sister   ? Healthy Sister   ? Healthy Brother   ?  Healthy Son   ? Healthy Son   ? Healthy Son   ? Healthy Son   ? ? ?Social History:  reports that he has never smoked. He has never used smokeless tobacco. He reports that he does not currently use alcohol after a past usage of about 1.0 standard drink per week. He reports that he does not use drugs. ? ?Allergies:  ?Allergies  ?Allergen Reactions  ? Asa [Aspirin] Shortness Of Breath  ? ? ?Medications: I have reviewed the patient's current medications. ?Prior to Admission:  ?Medications Prior to Admission  ?Medication Sig Dispense Refill Last Dose  ? acamprosate (CAMPRAL) 333 MG tablet Take 666 mg by mouth 3 (three) times daily.   06/23/2021  ? acetaminophen (TYLENOL) 500 MG tablet Take 500 mg by mouth daily as needed for mild pain or moderate pain.   unk  ? busPIRone (BUSPAR) 10 MG tablet Take 1 tablet (10 mg total) by mouth 2 (two) times daily. 10 tablet 0 06/23/2021  ? doxepin (SINEQUAN) 25 MG capsule Take 25 mg by mouth at bedtime.   Past Week  ? ELIQUIS 5 MG TABS tablet TAKE 1 TABLET BY MOUTH TWICE A DAY 60 tablet 2 06/23/2021 at 0900  ? famotidine (PEPCID) 20 MG tablet Take 1 tablet (20 mg total) by mouth at bedtime.   06/23/2021  ? metoprolol tartrate (LOPRESSOR) 25 MG tablet Take 1 tablet (25 mg total) by mouth 2 (two) times daily. 60 tablet  1 06/23/2021 at 0900  ? pantoprazole (PROTONIX) 40 MG tablet Take 1 tablet (40 mg total) by mouth daily. 30 tablet 0 06/23/2021  ? sertraline (ZOLOFT) 100 MG tablet Take 100 mg by mouth daily.   06/23/2021  ? tamsulosin (FLOMAX) 0.4 MG CAPS capsule Take 0.4 mg by mouth daily.   06/23/2021  ? diclofenac Sodium (VOLTAREN) 1 % GEL Apply 2 g topically 4 (four) times daily. (Patient not taking: Reported on 06/24/2021)   Not Taking  ? HYDROcodone-acetaminophen (NORCO/VICODIN) 5-325 MG tablet Take 2 tablets by mouth every 4 (four) hours as needed. (Patient not taking: Reported on 06/24/2021) 6 tablet 0 Not Taking  ? ondansetron (ZOFRAN-ODT) 4 MG disintegrating tablet Take 1 tablet (4 mg  total) by mouth every 8 (eight) hours as needed for nausea or vomiting. (Patient not taking: Reported on 06/24/2021) 20 tablet 0 Not Taking  ? thiamine 100 MG tablet Take 1 tablet (100 mg total) by mouth daily. (Patient not taking: Reported on 06/24/2021) 30 tablet 0 Not Taking  ? vitamin B-12 (CYANOCOBALAMIN) 1000 MCG tablet Take 1 tablet (1,000 mcg total) by mouth daily. (Patient not taking: Reported on 06/24/2021) 30 tablet 2 Not Taking  ? Vitamin D, Ergocalciferol, (DRISDOL) 1.25 MG (50000 UNIT) CAPS capsule Take 50,000 Units by mouth every 7 (seven) days. (Patient not taking: Reported on 06/24/2021)   Not Taking  ? ? ?ROS: Constitutional: No fever or chills ?Vision: No changes in vision ?ENT: No difficulty swallowing ?CV: No chest pain ?Pulm: No SOB or wheezing ?GI: No nausea or vomiting ?GU: No urgency or inability to hold urine ?Skin: No poor wound healing ?Neurologic: No numbness or tingling ?Psychiatric: No depression or anxiety ?Heme: No bruising ?Allergic: No reaction to medications or food ? ? ?Exam: ?Blood pressure 114/85, pulse 90, temperature 98.3 ?F (36.8 ?C), temperature source Oral, resp. rate 20, height 5' 7.5" (1.715 m), weight 71.1 kg, SpO2 96 %. ?General: Sitting up in bed, no acute distress ?Orientation: Alert and oriented x4 ?Mood and Affect: Mood and affect appropriate, pleasant and cooperative ?Coordination and balance: Within normal limits ? ?Left lower extremity: Knee with minimal to mild effusion.  No surrounding redness.  No tenderness with palpation over the suprapatellar pouch or throughout the anterior knee.  Does have some joint line tenderness related to known osteoarthritis.  Is able to flex the knee to at least 60 degrees without any notable pain.  Able to get full knee extension.  Endorses sensation throughout extremity.  Is neurovascularly intact ? ?Right lower extremity: Skin without lesions. No tenderness to palpation. Full painless ROM, full strength in each muscle groups  without evidence of instability. ? ? ?Medical Decision Making: ?Data: ?Imaging: [facility] and lateral views of the left knee performed on 07/02/2021 have been reviewed which showed mild tricompartmental osteoarthritis.  Large knee effusion noted at that time. ? ?Labs:  ?Results for orders placed or performed during the hospital encounter of 06/23/21 (from the past 24 hour(s))  ?Glucose, capillary     Status: Abnormal  ? Collection Time: 07/03/21  6:00 PM  ?Result Value Ref Range  ? Glucose-Capillary 137 (H) 70 - 99 mg/dL  ?Comprehensive metabolic panel     Status: Abnormal  ? Collection Time: 07/04/21  4:12 AM  ?Result Value Ref Range  ? Sodium 135 135 - 145 mmol/L  ? Potassium 3.8 3.5 - 5.1 mmol/L  ? Chloride 103 98 - 111 mmol/L  ? CO2 24 22 - 32 mmol/L  ? Glucose, Bld 173 (  H) 70 - 99 mg/dL  ? BUN 16 8 - 23 mg/dL  ? Creatinine, Ser 0.84 0.61 - 1.24 mg/dL  ? Calcium 8.2 (L) 8.9 - 10.3 mg/dL  ? Total Protein 5.2 (L) 6.5 - 8.1 g/dL  ? Albumin 1.8 (L) 3.5 - 5.0 g/dL  ? AST 18 15 - 41 U/L  ? ALT 13 0 - 44 U/L  ? Alkaline Phosphatase 140 (H) 38 - 126 U/L  ? Total Bilirubin 1.6 (H) 0.3 - 1.2 mg/dL  ? GFR, Estimated >60 >60 mL/min  ? Anion gap 8 5 - 15  ?CBC     Status: Abnormal  ? Collection Time: 07/04/21  4:12 AM  ?Result Value Ref Range  ? WBC 6.4 4.0 - 10.5 K/uL  ? RBC 2.67 (L) 4.22 - 5.81 MIL/uL  ? Hemoglobin 8.5 (L) 13.0 - 17.0 g/dL  ? HCT 24.8 (L) 39.0 - 52.0 %  ? MCV 92.9 80.0 - 100.0 fL  ? MCH 31.8 26.0 - 34.0 pg  ? MCHC 34.3 30.0 - 36.0 g/dL  ? RDW 15.0 11.5 - 15.5 %  ? Platelets 161 150 - 400 K/uL  ? nRBC 0.0 0.0 - 0.2 %  ? ? ? ?Assessment/Plan: ?87 year old male with left knee pain ? ?Patient not currently having any significant pain in the knee, notes that it is much improved from yesterday's exam.  He has no tenderness with palpation on exam.  He is able to flex and extend the knee without issues.  Notes that the swelling has improved from 24 hours ago.  I did discuss with patient that we could aspirate the knee to  evaluate for any infection but based on clinical exam today, I do not feel that this is indicated.  Patient is in agreement.  Please reconsult orthopedics if knee effusion worsens and aspiration of knee is felt to

## 2021-07-04 NOTE — Progress Notes (Signed)
Patient ID: Jon Bennett, male   DOB: 28-Mar-1934, 87 y.o.   MRN: [id number] ? ? ? Progress Note ? ? Subjective  ?Day # 10 ?CC: acute cholangitis/E. coli bacteremia with choledocholithiasis ? ?ERCP 07/03/20 with Dr. Chales Abrahams showed: ?- The major papilla was located entirely within a diverticulum. ?- Choledocholithiasis s/p biliary sphincterotomy with stone extraction. ?- Cholelithiasis with patent cystic duct ?- Moderate size hiatal hernia. ? ?Continues on Zosyn ? ?GI ROS is negative today. Tolerating clear liquid breakfast without complaint. No family present at the time of my evaluation.  ? ? ? Objective  ? ?Vital signs in last 24 hours: ?Temp:  [97.6 ?F (36.4 ?C)-98.4 ?F (36.9 ?C)] 98.3 ?F (36.8 ?C) (04/22 0755) ?Pulse Rate:  [79-95] 90 (04/22 0755) ?Resp:  [17-25] 20 (04/22 0755) ?BP: (95-143)/(57-85) 114/85 (04/22 0755) ?SpO2:  [93 %-98 %] 96 % (04/22 0755) ?Last BM Date : 07/02/21 ?General:   Elderly white male in no acute distress ?Heart:  Regular rate and rhythm; no murmurs ?Lungs: Respirations even and unlabored, lungs CTA bilaterally ?Abdomen:  Soft, nontender and nondistended. Normal bowel sounds. ?Extremities:  Without edema. ?Neurologic:  Alert and oriented,  grossly normal neurologically. ?Psych:  Cooperative. Normal mood and affect. ? ?Intake/Output from previous day: ?04/21 0701 - 04/22 0700 ?In: 2519.2 [I.V.:2469.2; IV Piggyback:50] ?Out: 600 [Urine:600] ?Intake/Output this shift: ?No intake/output data recorded. ? ?Lab Results: ?Recent Labs  ?  07/02/21 ?0633 07/03/21 ?0105 07/04/21 ?0412  ?WBC 8.1 8.4 6.4  ?HGB 8.8* 8.7* 8.5*  ?HCT 26.4* 25.6* 24.8*  ?PLT 117* 136* 161  ? ?BMET ?Recent Labs  ?  07/02/21 ?0633 07/03/21 ?0105 07/04/21 ?0412  ?NA 128* 133* 135  ?K 3.5 3.5 3.8  ?CL 99 102 103  ?CO2 20* 24 24  ?GLUCOSE 112* 112* 173*  ?BUN 6* 8 16  ?CREATININE 0.77 0.83 0.84  ?CALCIUM 7.9* 8.2* 8.2*  ? ?LFT ?Recent Labs  ?  07/04/21 ?0412  ?PROT 5.2*  ?ALBUMIN 1.8*  ?AST 18  ?ALT 13  ?ALKPHOS 140*  ?BILITOT  1.6*  ? ?PT/INR ?No results for input(s): LABPROT, INR in the last 72 hours. ? ?Studies/Results: ?DG Chest Port 1 View ? ?Result Date: 07/02/2021 ?CLINICAL DATA:  PICC placement EXAM: PORTABLE CHEST 1 VIEW COMPARISON:  06/27/2021 FINDINGS: Single frontal view of the chest demonstrates interval placement of right-sided PICC, tip overlying the atriocaval junction. The cardiac silhouette is stable. Patchy consolidation has developed at the left lung base, which may reflect hypoventilatory change. No effusion or pneumothorax. Hiatal hernia unchanged. No acute bony abnormalities. IMPRESSION: 1. Right-sided PICC tip overlying atriocaval junction. 2. Hiatal hernia. 3. Developing consolidation at the left lung base, favor atelectasis. Electronically Signed   By: Sharlet Salina M.D.   On: 07/02/2021 19:52  ? ?DG ERCP ? ?Result Date: 07/04/2021 ?CLINICAL DATA:  87 year old male with a history jaundice EXAM: ERCP TECHNIQUE: Multiple spot images obtained with the fluoroscopic device and submitted for interpretation post-procedure. FLUOROSCOPY: Radiation Exposure Index (as provided by the fluoroscopic device): 189.8 mGy Kerma COMPARISON:  None. FINDINGS: Limited intraoperative fluoroscopic spot images during ERCP. Initial image demonstrates endoscope projecting over the upper abdomen. Subsequently there is cannulation of the ampulla and retrograde infusion of contrast. Final image demonstrates partial opacification of the extrahepatic biliary ducts with endoscope and safety wire in place. IMPRESSION: Limited intraoperative fluoroscopic spot images during ERCP, as above. Please refer to the dictated operative report for full details of intraoperative findings and procedure. Electronically Signed   By: Marijean Niemann  Loreta Ave D.O.   On: 07/04/2021 08:00  ? ?DG Knee Left Port ? ?Result Date: 07/02/2021 ?CLINICAL DATA:  Left knee edema EXAM: PORTABLE LEFT KNEE - 1-2 VIEW COMPARISON:  None. FINDINGS: Frontal, bilateral oblique, and cross-table  lateral views of the left knee are obtained. No acute fracture, subluxation, or dislocation. There is mild 3 compartmental osteoarthritis. Large joint effusion. Soft tissues are otherwise unremarkable. IMPRESSION: 1. Large suprapatellar joint effusion. 2. Mild 3 compartmental osteoarthritis. 3. No acute fracture. Electronically Signed   By: Sharlet Salina M.D.   On: 07/02/2021 19:50   ? ? ? ? Assessment / Plan:   ? ?#70 87 year old white male initially admitted at [facility] on 06/23/2021 with abdominal pain nausea vomiting.  Found to have acute cholangitis, E. coli bacteremia, and choledocholithiasis. Successful removal of stones on ERCP yesterday. No GI symptoms today. Continue antibiotics for 5 more days. Liver enzymes continue to trend down. Surgery considering timing of cholecystectomy. Defer diet recommendations to them. Would hold Eliquis at least 72 hours following ERCP, but, would clarify surgical plans prior to resuming.  He will need GI follow-up with Dr. Karilyn Cota or Dr. Marletta Lor in 4.6 weeks.  ? ?#2 history of splenic vein thrombosis-been on Eliquis as an outpatient ?#3 history of EtOH abuse-no evidence of cirrhosis by imaging ?#4 diarrhea-Flexi-Seal in place ?Likely antibiotic related, rule out C. Difficile ? ? ?Recommendations: ?- Advance gradually to low-fat diet if approved by surgery ?- Trend CBC, LFTs. ?- Continue antibiotics x 5 days more. ?- Timing of Eliquis to be determined by surgery. From GI standpoint, hold for at least 72 hours after sphincterotomy 07/03/21. ?- He should follow-up in GI clinic with Dr. Karilyn Cota or Dr. Marletta Lor on discharge in 4 to 6 weeks ? ?The inpatient GI team will move to stand-by. Please call the on-call gastroenterologist with any additional questions or concerns during this hospitalization.  ? ? ?Principal Problem: ?  Intractable nausea and vomiting ?Active Problems: ?  Thrombocytopenia (HCC) ?  GERD (gastroesophageal reflux disease) ?  Sepsis from E coli bacteremia  ?   Hypomagnesemia ?  Lactic acidosis ?  Hypercalcemia ?  Transaminitis ?  GI bleed ?  AKI (acute kidney injury) (HCC) ?  Hypophosphatemia ?  Ascending cholangitis with severe sepsis ?  Chronic anticoagulation ?  Calculus of gallbladder with acute cholecystitis and obstruction ?  Hyperbilirubinemia ?  Hypokalemia ?  Pressure injury of skin ? ? ? ? LOS: 10 days  ? ?Tressia Danas PA-C 07/04/2021, 9:41 AM ?  ?

## 2021-07-05 ENCOUNTER — Encounter (HOSPITAL_COMMUNITY)
Admission: EM | Disposition: A | Discharge status: Skilled Nursing Facility | Payer: Self-pay | Source: Home / Self Care | Attending: Internal Medicine

## 2021-07-05 DIAGNOSIS — K8309 Other cholangitis: Secondary | ICD-10-CM | POA: Diagnosis not present

## 2021-07-05 DIAGNOSIS — K8001 Calculus of gallbladder with acute cholecystitis with obstruction: Secondary | ICD-10-CM | POA: Diagnosis not present

## 2021-07-05 DIAGNOSIS — A419 Sepsis, unspecified organism: Secondary | ICD-10-CM | POA: Diagnosis not present

## 2021-07-05 DIAGNOSIS — K922 Gastrointestinal hemorrhage, unspecified: Secondary | ICD-10-CM | POA: Diagnosis not present

## 2021-07-05 DIAGNOSIS — D649 Anemia, unspecified: Secondary | ICD-10-CM

## 2021-07-05 LAB — CBC
HCT: 21.6 % — ABNORMAL LOW (ref 39.0–52.0)
Hemoglobin: 7.1 g/dL — ABNORMAL LOW (ref 13.0–17.0)
MCH: 31.4 pg (ref 26.0–34.0)
MCHC: 32.9 g/dL (ref 30.0–36.0)
MCV: 95.6 fL (ref 80.0–100.0)
Platelets: 169 10*3/uL (ref 150–400)
RBC: 2.26 MIL/uL — ABNORMAL LOW (ref 4.22–5.81)
RDW: 14.9 % (ref 11.5–15.5)
WBC: 5.1 10*3/uL (ref 4.0–10.5)
nRBC: 0 % (ref 0.0–0.2)

## 2021-07-05 LAB — COMPREHENSIVE METABOLIC PANEL
ALT: 13 U/L (ref 0–44)
AST: 20 U/L (ref 15–41)
Albumin: 2.7 g/dL — ABNORMAL LOW (ref 3.5–5.0)
Alkaline Phosphatase: 89 U/L (ref 38–126)
Anion gap: 7 (ref 5–15)
BUN: 17 mg/dL (ref 8–23)
CO2: 26 mmol/L (ref 22–32)
Calcium: 8.3 mg/dL — ABNORMAL LOW (ref 8.9–10.3)
Chloride: 103 mmol/L (ref 98–111)
Creatinine, Ser: 0.81 mg/dL (ref 0.61–1.24)
GFR, Estimated: >60 mL/min (ref >60)
Glucose, Bld: 108 mg/dL — ABNORMAL HIGH (ref 70–99)
Potassium: 3.2 mmol/L — ABNORMAL LOW (ref 3.5–5.1)
Sodium: 136 mmol/L (ref 135–145)
Total Bilirubin: 0.9 mg/dL (ref 0.3–1.2)
Total Protein: 5 g/dL — ABNORMAL LOW (ref 6.5–8.1)

## 2021-07-05 LAB — PHOSPHORUS: Phosphorus: 2.4 mg/dL — ABNORMAL LOW (ref 2.5–4.6)

## 2021-07-05 LAB — PREPARE RBC (CROSSMATCH)

## 2021-07-05 LAB — ABO/RH: ABO/RH(D): A NEG

## 2021-07-05 LAB — MAGNESIUM: Magnesium: 1.9 mg/dL (ref 1.7–2.4)

## 2021-07-05 SURGERY — LAPAROSCOPIC CHOLECYSTECTOMY
Anesthesia: General

## 2021-07-05 MED ORDER — SODIUM CHLORIDE 0.9% IV SOLUTION: Freq: Once | INTRAVENOUS | Status: AC

## 2021-07-05 MED ORDER — POTASSIUM CHLORIDE CRYS ER 20 MEQ PO TBCR
30.0000 meq | EXTENDED_RELEASE_TABLET | Freq: Two times a day (BID) | ORAL | Status: AC
  Administered 2021-07-05 (×2): 30 meq via ORAL
  Filled 2021-07-05 (×2): qty 1

## 2021-07-05 MED ORDER — POTASSIUM PHOSPHATES 15 MMOLE/5ML IV SOLN
15.0000 mmol | Freq: Once | INTRAVENOUS | Status: AC
  Administered 2021-07-05: 15 mmol via INTRAVENOUS
  Filled 2021-07-05: qty 5

## 2021-07-05 MED ORDER — SODIUM PHOSPHATES 45 MMOLE/15ML IV SOLN: 15.0000 mmol | Freq: Once | INTRAVENOUS | Status: DC

## 2021-07-05 MED ORDER — HEPARIN SODIUM (PORCINE) 5000 UNIT/ML IJ SOLN
5000.0000 [IU] | Freq: Three times a day (TID) | INTRAMUSCULAR | Status: DC
  Administered 2021-07-05 – 2021-07-07 (×6): 5000 [IU] via SUBCUTANEOUS
  Filled 2021-07-05 (×7): qty 1

## 2021-07-05 MED ORDER — LOPERAMIDE HCL 2 MG PO CAPS
4.0000 mg | ORAL_CAPSULE | Freq: Once | ORAL | Status: AC
Start: 2021-07-05 — End: 2021-07-05
  Administered 2021-07-05: 4 mg via ORAL
  Filled 2021-07-05: qty 2

## 2021-07-05 NOTE — Progress Notes (Addendum)
?PROGRESS NOTE ? ? ? ?Jon RainwaterJames D Bennett  ZOX:[id number] DOB: 07/18/1934 DOA: 06/23/2021 ?PCP: Julieanne CottonPaul, [name], Bennett  ? ? ?Chief Complaint  ?Patient presents with  ? Vomiting  ? ? ?Brief Narrative:  ? ?87 y.o. male with medical history significant of type 2 diabetes mellitus, hypertension, splenic vein thrombosis, alcohol withdrawal, GERD who presents to the emergency department due to several days of onset of nausea and vomiting and increased urinary frequency.  He denies any abdominal pain, chest pain, headache, blurry vision, shortness of breath.  Patient states that he has abstained from alcohol for several months. Patient was seen in the ED on 3/23 due to alcoholic gastritis, he was treated in the ED and discharged home.  Apparently, patient had a pint of vodka about 4 days prior to the ED visit.  ?-He was noted to be febrile, tachypneic and tachycardic, work-up significant for ascending cholangitis, was admitted to [facility], but he was [facility] 07/[facility]/2021 for further management per GI. ?    ?His blood cultures 4/11 positive for E coli. Pharmacy changed abx to ceftriaxone on 4/13.    ?  ?06/26/2021 : surgery planning for cholecystectomy in next couple of days. Narrowed antibiotics for E colic bacteremia to cefazolin starting on 4/14.   ?  ?06/27/2021: spike in bilirubin to 4.1 today.  GI planning MRCP on 4/17.  Timing of cholecystectomy to be determined after further testing.   Pt developed worsening sepsis, spiked fever, ammonia elevated, WBC and procalcitonin rising. Transferred to stepdown ICU.  Spoke with son and DNR order confirmed.  Repeated blood cultures. Broadened antibiotics.   ?  ?06/28/2021:  Worsening encephalopathy today, platelets down to 52.  WBC and procalcitonin rising.  Conferenced with GI and surgery.  Recommending ERCP but GI reports he is too complex to have done at [facility].  [facility] or [facility] per GI. Dr. Marletta Lorarver reaching out to [place] team in  [place].  I spoke with family if they wanted to proceed with ERCP.  I spoke with son Jon Bennett who is agreeable to ERCP understanding that there are significant risks involved.  He was agreeable to [place] [facility].  ?  ?06/29/2021: pt still waiting for [facility].  Cancelled NPO order and allowed to eat.  Ammonia coming down.  I updated son Jon Bennett about situation with transfer. Pt clinically appears nontoxic.  Bilirubin remains elevated but slightly improved today.   ?  ?06/30/2021: bilirubin down to 2.6 today.  Encephalopathy improved and lactulose discontinued due to loose stools.   ? ?07/02/2021: MRCP significant for cholelithiasis, plan for ERCP  ? ?07/03/2021: Status post ERCP with Dr. Chales AbrahamsGupta showed: ?- The major papilla was located entirely within a diverticulum. ?- Choledocholithiasis s/p biliary sphincterotomy with stone extraction. ?- Cholelithiasis with patent cystic duct ?- Moderate size hiatal hernia. ? ?07/05/2021: Hemoglobin low at 7.1,  received 2 units PRBC, no evidence of melena or bright red blood per rectum. ? ? ? ?Assessment & Plan: ?  ?Principal Problem: ?  Intractable nausea and vomiting ?Active Problems: ?  Thrombocytopenia (HCC) ?  GERD (gastroesophageal reflux disease) ?  Sepsis from E coli bacteremia  ?  Hypomagnesemia ?  Lactic acidosis ?  Hypercalcemia ?  Transaminitis ?  GI bleed ?  AKI (acute kidney injury) (HCC) ?  Hypophosphatemia ?  Ascending cholangitis with severe sepsis ?  Chronic anticoagulation ?  Calculus of gallbladder with acute cholecystitis and obstruction ?  Hyperbilirubinemia ?  Hypokalemia ?  Pressure injury of skin ? ? ?Ascending cholangitis with severe sepsis ?-Enzymes appears to be improving, ultrasound prior to transfer from [facility] showing no biliary dilation or choledocholithiasis. ?-Continue with IV Zosyn for another 5 days after ERCP 4/21 per GI recommendation ?- MRCP did show choledocholithiasis with a distal common duct stone  approximately 7 mm and possibly smaller more proximal common duct stones without significant biliary dilatation.  LFTs continue to trend down. ?-ERCP 4/21 status post biliary sphincterectomy with stone extraction ?-Surgery consulted, lap cholecystectomy has been postponed today, likely will be done tomorrow as discussed with general surgery. ?- continue with IV Zosyn until 07/08/2021 ?  ?Calculus of gallbladder with acute cholecystitis and obstruction ?-Status post-ERCP with stone extraction, surgery consulted,  ? ?Sepsis from E coli bacteremia  ?-Due to above, continue with IV Zosyn 07/08/2021 ? ?Intractable nausea and vomiting ?- due to above, resolved ?  ?Hypokalemia ?-- repleted  ?  ?Hyperbilirubinemia ?-Due to above, trending down ?  ?History of splenic vein thrombosis ?-Coagulation which has been held since admission anticipation for procedures and surgery ? 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 ?  ?AKI (acute kidney injury) (HCC) ?-Resolved with hydration ?  ? GI bleed ?--appreciate GI consultation, EGD 4/13 with findings of small mallory weiss tear ?--continue protonix as ordered for GI protection ?-Continue to monitor CBC closely and transfuse as needed ?  ?Hypercalcemia ?--resolved after hydration with IV fluid ?  ?Lactic acidosis ?-- resolved with IV fluid ?  ?Hypomagnesemia ?- repleted  ? ?GERD (gastroesophageal reflux disease) ?--protonix for GI protection  ?  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 ?Hyponatremia ?-Improving ? ?Anemia of chronic illness and acute blood loss ?-With Mallory-Weiss tear initially, hemoglobin continues to trend down most likely in the setting of acute illness, was transfused 2  units PRBC for hemoglobin of 7.1 yesterday, good response with hemoglobin 9.6 today. ? ?Left Knee effusion ?-Patient complains of pain, left knee is swollen, tender to palpation, has significantly improved over last 48 hours, Ortho input greatly appreciated, at this point no indication for arthrocentesis, this can be reconsidered if pain/swelling or reoccurs.   ? ? ?  ? ? ?DVT prophylaxis:  Heparin, resume Eliquis tomorrow ?Code Status: DNR ?Family Communication: Cussed with his son Dr. Rosanne Ashing by phone daily ?Disposition:  ? ?Status is: Inpatient ? ?  ?Consultants:  ?GI  ?Palliaitve ?General surgery  ?orthopedic ? ? ?Subjective: ? ?Minimal pain, no nausea, no vomiting, no significant events overnight. ? ?Objective: ?Vitals:  ? 07/05/21 0322 07/05/21 0400 07/05/21 0500 07/05/21 0754  ?BP: (!) 90/51   (!) 100/52  ?Pulse: 68 76 72 70  ?Resp: 17 20 14 18   ?Temp: 98.3 ?F (36.8 ?C)   97.9 ?F (36.6 ?C)  ?TempSrc: Oral   Oral  ?SpO2: 94% 96% 95% 96%  ?Weight:      ?Height:      ? ?No intake or output data in the 24 hours ending 07/05/21 1225 ? ?Filed Weights  ? 06/28/21 0500 06/29/21 0500 06/30/21 0500  ?Weight: 69.3 kg 71.1 kg 71.1 kg  ? ? ?Examination: ? ?Awake Alert, Oriented X 3, No new F.N deficits, Normal affect, frail, deconditioned ?Symmetrical Chest wall movement, Good air movement  bilaterally, CTAB ?RRR,No Gallops,Rubs or new Murmurs, No Parasternal Heave ?+ve B.Sounds, Abd Soft, No tenderness, No rebound - guarding or rigidity. ?No Cyanosis, Clubbing or edema, left knee swelling and pain is minimal. ? ? ? ? ? ? ?Data Reviewed: I have personally reviewed following labs and imaging studies ? ?CBC: ?Recent Labs  ?Lab 06/29/21 ?6073 06/30/21 ?7106 07/02/21 ?2694 07/03/21 ?0105 07/04/21 ?8546 07/05/21 ?2703  ?WBC 7.5 5.2 8.1 8.4 6.4 5.1  ?NEUTROABS 6.4 4.1  --   --   --   --   ?HGB 9.6* 9.3* 8.8* 8.7* 8.5* 7.1*  ?HCT 27.7* 26.0* 26.4* 25.6* 24.8* 21.6*  ?MCV 91.1 89.3 92.0 91.8 92.9 95.6  ?PLT 62* 77* 117* 136* 161 169   ? ? ? ?Basic Metabolic Panel: ?Recent Labs  ?Lab 06/29/21 ?5009 06/30/21 ?3818 07/01/21 ?1110 07/02/21 ?2993 07/03/21 ?0105 07/04/21 ?7169 07/05/21 ?6789  ?NA 136 136  --  128* 133* 135 136  ?K 3.4* 3.4*  --  3.5 3.5 3.

## 2021-07-05 NOTE — Progress Notes (Addendum)
2 Days Post-Op  ? ?Subjective/Chief Complaint: ? Feels miserable this morning, fatigued, states he can't even roll himself over in the bed. No abdominal pain. Feels hungry. He does not want to have surgery today, "I beg you not to" ? ? ?Objective: ?Vital signs in last 24 hours: ?Temp:  [97.3 ?F (36.3 ?C)-98.3 ?F (36.8 ?C)] 98.3 ?F (36.8 ?C) (04/23 0322) ?Pulse Rate:  [66-112] 72 (04/23 0500) ?Resp:  [14-21] 14 (04/23 0500) ?BP: (90-127)/(48-78) 90/51 (04/23 0322) ?SpO2:  [93 %-98 %] 95 % (04/23 0500) ?Last BM Date : 07/04/21 ? ?Intake/Output from previous day: ?No intake/output data recorded. ?Intake/Output this shift: ?No intake/output data recorded. ? ?A&O x 3, ?Unlabored respirations ?Abdomen soft, mildly distended, nontender. Well healed rlq appendectomy scar ? ?Lab Results:  ?Recent Labs  ?  07/04/21 ?0412 07/05/21 ?0346  ?WBC 6.4 5.1  ?HGB 8.5* 7.1*  ?HCT 24.8* 21.6*  ?PLT 161 169  ? ? ?BMET ?Recent Labs  ?  07/04/21 ?0412 07/05/21 ?0346  ?NA 135 136  ?K 3.8 3.2*  ?CL 103 103  ?CO2 24 26  ?GLUCOSE 173* 108*  ?BUN 16 17  ?CREATININE 0.84 0.81  ?CALCIUM 8.2* 8.3*  ? ? ?PT/INR ?No results for input(s): LABPROT, INR in the last 72 hours. ?ABG ?No results for input(s): PHART, HCO3 in the last 72 hours. ? ?Invalid input(s): PCO2, PO2 ? ?Studies/Results: ?DG ERCP ? ?Result Date: 07/04/2021 ?CLINICAL DATA:  87 year old male with a history jaundice EXAM: ERCP TECHNIQUE: Multiple spot images obtained with the fluoroscopic device and submitted for interpretation post-procedure. FLUOROSCOPY: Radiation Exposure Index (as provided by the fluoroscopic device): 189.8 mGy Kerma COMPARISON:  None. FINDINGS: Limited intraoperative fluoroscopic spot images during ERCP. Initial image demonstrates endoscope projecting over the upper abdomen. Subsequently there is cannulation of the ampulla and retrograde infusion of contrast. Final image demonstrates partial opacification of the extrahepatic biliary ducts with endoscope and safety  wire in place. IMPRESSION: Limited intraoperative fluoroscopic spot images during ERCP, as above. Please refer to the dictated operative report for full details of intraoperative findings and procedure. Electronically Signed   By: Gilmer Mor D.O.   On: 07/04/2021 08:00   ? ?Anti-infectives: ?Anti-infectives (From admission, onward)  ? ? Start     Dose/Rate Route Frequency Ordered Stop  ? 06/28/21 1215  piperacillin-tazobactam (ZOSYN) IVPB 3.375 g       ? 3.375 g ?12.5 mL/hr over 240 Minutes Intravenous Every 8 hours 06/28/21 1120 07/08/21 2359  ? 06/27/21 1945  cefTRIAXone (ROCEPHIN) 2 g in sodium chloride 0.9 % 100 mL IVPB  Status:  Discontinued       ? 2 g ?200 mL/hr over 30 Minutes Intravenous Every 24 hours 06/27/21 1847 06/28/21 1120  ? 06/27/21 0800  ceFAZolin (ANCEF) IVPB 2g/100 mL premix  Status:  Discontinued       ? 2 g ?200 mL/hr over 30 Minutes Intravenous Every 8 hours 06/26/21 1520 06/27/21 1847  ? 06/25/21 1000  vancomycin (VANCOREADY) IVPB 1250 mg/250 mL  Status:  Discontinued       ? 1,250 mg ?166.7 mL/hr over 90 Minutes Intravenous Every 36 hours 06/23/21 2131 06/24/21 1537  ? 06/25/21 1000  cefTRIAXone (ROCEPHIN) 2 g in sodium chloride 0.9 % 100 mL IVPB  Status:  Discontinued       ? 2 g ?200 mL/hr over 30 Minutes Intravenous Every 24 hours 06/25/21 0909 06/26/21 1520  ? 06/24/21 1000  ceFEPIme (MAXIPIME) 2 g in sodium chloride 0.9 % 100 mL IVPB  Status:  Discontinued       ? 2 g ?200 mL/hr over 30 Minutes Intravenous Every 12 hours 06/23/21 2131 06/25/21 0909  ? 06/23/21 2045  vancomycin (VANCOREADY) IVPB 1250 mg/250 mL       ? 1,250 mg ?166.7 mL/hr over 90 Minutes Intravenous  Once 06/23/21 2037 06/23/21 2338  ? 06/23/21 2030  ceFEPIme (MAXIPIME) 2 g in sodium chloride 0.9 % 100 mL IVPB       ? 2 g ?200 mL/hr over 30 Minutes Intravenous  Once 06/23/21 2030 06/23/21 2129  ? 06/23/21 2030  metroNIDAZOLE (FLAGYL) IVPB 500 mg       ? 500 mg ?100 mL/hr over 60 Minutes Intravenous  Once 06/23/21  2030 06/23/21 2227  ? 06/23/21 2030  vancomycin (VANCOCIN) IVPB 1000 mg/200 mL premix  Status:  Discontinued       ? 1,000 mg ?200 mL/hr over 60 Minutes Intravenous  Once 06/23/21 2030 06/23/21 2037  ? ?  ? ? ?Assessment/Plan: ?s/p Procedure(s): ?ENDOSCOPIC RETROGRADE CHOLANGIOPANCREATOGRAPHY (ERCP) (N/A) ?SPHINCTEROTOMY ?REMOVAL OF STONES ?We again discussed indication and typical timeline recommendation for cholecystectomy in setting of choledocholithiasis. He understands the risk of recurrence if he keeps his gallbladder. In general today he is feeling poorly and looks it too. I am going to cancel his surgery today and let him eat. If he becomes medically ready for discharge and still has not had his gallbladder out, we can certainly have him follow up as an outpatient with his surgeon of choice; I do however think that the best course of action would be to proceed with cholecystectomy this admission. ? ? LOS: 11 days  ? ? ?Valeska Haislip Lollie Sails ?07/05/2021 ? ?

## 2021-07-05 NOTE — Plan of Care (Signed)
?  Problem: Nutrition: Goal: Adequate nutrition will be maintained Outcome: Progressing   Problem: Education: Goal: Knowledge of General Education information will improve Description: Including pain rating scale, medication(s)/side effects and non-pharmacologic comfort measures Outcome: Progressing   

## 2021-07-06 DIAGNOSIS — K8001 Calculus of gallbladder with acute cholecystitis with obstruction: Secondary | ICD-10-CM | POA: Diagnosis not present

## 2021-07-06 DIAGNOSIS — K8309 Other cholangitis: Secondary | ICD-10-CM | POA: Diagnosis not present

## 2021-07-06 LAB — COMPREHENSIVE METABOLIC PANEL
ALT: 13 U/L (ref 0–44)
AST: 18 U/L (ref 15–41)
Albumin: 2.7 g/dL — ABNORMAL LOW (ref 3.5–5.0)
Alkaline Phosphatase: 85 U/L (ref 38–126)
Anion gap: 6 (ref 5–15)
BUN: 11 mg/dL (ref 8–23)
CO2: 26 mmol/L (ref 22–32)
Calcium: 8.5 mg/dL — ABNORMAL LOW (ref 8.9–10.3)
Chloride: 106 mmol/L (ref 98–111)
Creatinine, Ser: 0.78 mg/dL (ref 0.61–1.24)
GFR, Estimated: >60 mL/min (ref >60)
Glucose, Bld: 119 mg/dL — ABNORMAL HIGH (ref 70–99)
Potassium: 4.3 mmol/L (ref 3.5–5.1)
Sodium: 138 mmol/L (ref 135–145)
Total Bilirubin: 1.2 mg/dL (ref 0.3–1.2)
Total Protein: 5.3 g/dL — ABNORMAL LOW (ref 6.5–8.1)

## 2021-07-06 LAB — TYPE AND SCREEN
ABO/RH(D): A NEG
Antibody Screen: NEGATIVE
Unit division: 0
Unit division: 0

## 2021-07-06 LAB — CBC
HCT: 29.5 % — ABNORMAL LOW (ref 39.0–52.0)
Hemoglobin: 9.6 g/dL — ABNORMAL LOW (ref 13.0–17.0)
MCH: 30.5 pg (ref 26.0–34.0)
MCHC: 32.5 g/dL (ref 30.0–36.0)
MCV: 93.7 fL (ref 80.0–100.0)
Platelets: 195 10*3/uL (ref 150–400)
RBC: 3.15 MIL/uL — ABNORMAL LOW (ref 4.22–5.81)
RDW: 15.1 % (ref 11.5–15.5)
WBC: 6.2 10*3/uL (ref 4.0–10.5)
nRBC: 0 % (ref 0.0–0.2)

## 2021-07-06 LAB — BPAM RBC
Blood Product Expiration Date: 202305182359
Blood Product Expiration Date: 202305182359
ISSUE DATE / TIME: 202304231256
ISSUE DATE / TIME: 202304231819
Unit Type and Rh: 600
Unit Type and Rh: 600

## 2021-07-06 LAB — AMMONIA: Ammonia: 26 umol/L (ref 9–35)

## 2021-07-06 LAB — PHOSPHORUS: Phosphorus: 3.2 mg/dL (ref 2.5–4.6)

## 2021-07-06 LAB — MAGNESIUM: Magnesium: 1.9 mg/dL (ref 1.7–2.4)

## 2021-07-06 MED ORDER — LOPERAMIDE HCL 2 MG PO CAPS
4.0000 mg | ORAL_CAPSULE | Freq: Once | ORAL | Status: AC
  Administered 2021-07-06: 4 mg via ORAL
  Filled 2021-07-06: qty 2

## 2021-07-06 MED ORDER — LOPERAMIDE HCL 2 MG PO CAPS
2.0000 mg | ORAL_CAPSULE | ORAL | Status: DC | PRN
  Administered 2021-07-08: 2 mg via ORAL
  Filled 2021-07-06: qty 1

## 2021-07-06 NOTE — TOC Progression Note (Addendum)
Transition of Care (TOC) - Progression Note  ? ? ?Patient Details  ?Name: Jon Bennett ?MRN: [id number] ?Date of Birth: 31-Oct-1934 ? ?Transition of Care (TOC) CM/SW Contact  ?Mearl Latin, LCSW ?Phone Number: ?07/06/2021, 9:57 AM ? ?Clinical Narrative:    ?9:57am-CSW reached out to [facility] again as there are currently no responses from them.  ? ?2:40pm-CSW left voicemail for patient's son [name] to go over SNF bed offers.  ? ?2:45pm-CSW provided bed offers to [facility] and he is requesting [facility] since patient has been there last year. CSW left vm for [name] in admissions there to review referral.  ? ? ?Expected Discharge Plan: Skilled Nursing Facility ?Barriers to Discharge: Continued Medical Work up, English as a second language teacher, SNF Pending bed offer ? ?Expected Discharge Plan and Services ?Expected Discharge Plan: Skilled Nursing Facility ?In-house Referral: Clinical Social Work ?  ?  ?Living arrangements for the past 2 months: Single Family Home ?                ?  ?  ?  ?  ?  ?  ?  ?  ?  ?  ? ? ?Social Determinants of Health (SDOH) Interventions ?  ? ?Readmission Risk Interventions ? ?  07/03/2021  ?  2:10 PM 06/26/2021  ?  3:09 PM 08/27/2020  ? 11:33 AM  ?Readmission Risk Prevention Plan  ?Transportation Screening Complete Complete Complete  ?PCP or Specialist Appt within 5-7 Days Complete  Complete  ?Home Care Screening Complete  Complete  ?Medication Review (RN CM) Complete  Complete  ?HRI or Home Care Consult  Complete   ?Palliative Care Screening  Not Applicable   ?Medication Review Oceanographer)  Complete   ? ? ?

## 2021-07-06 NOTE — Progress Notes (Signed)
Pt. First dsg change today. Upon removing dressing, noticed line was at 1cm. After dressing change, it was documented that at insertion, line was at 3cm. CXR from 4 days ago shows tip at correct placement, therefore, left line at 1cm. ?

## 2021-07-06 NOTE — Progress Notes (Signed)
OOB to chair attempted by this RN and NT Ayana. Patient able to stand at bedside for about 10 secs. He complains of left knee pain, refusing PRN meds and requesting heat packs.  ?Unable to convince patient to stand another time to pivot to chair. Patient repositioned back in bed. Bed in chair position. ?

## 2021-07-06 NOTE — Progress Notes (Addendum)
?PROGRESS NOTE ? ? ? ?Jon RainwaterJames D Bennett  AVW:[id number] DOB: 04/12/1934 DOA: 06/23/2021 ?PCP: Jon Bennett  ? ? ?Chief Complaint  ?Patient presents with  ? Vomiting  ? ? ?Brief Narrative:  ? ?87 y.o. male with medical history significant of type 2 diabetes mellitus, hypertension, splenic vein thrombosis, alcohol withdrawal, GERD who presents to the emergency department due to several days of onset of nausea and vomiting and increased urinary frequency.  He denies any abdominal pain, chest pain, headache, blurry vision, shortness of breath.  Patient states that he has abstained from alcohol for several months. Patient was seen in the ED on 3/23 due to alcoholic gastritis, he was treated in the ED and discharged home.  Apparently, patient had a pint of vodka about 4 days prior to the ED visit.  ?-He was noted to be febrile, tachypneic and tachycardic, work-up significant for ascending cholangitis, was admitted [facility], but he was [facility] for further management per GI. ?    ?His blood cultures 4/11 positive for E coli. Pharmacy changed abx to ceftriaxone on 4/13.    ?  ?06/26/2021 : surgery planning for cholecystectomy in next couple of days. Narrowed antibiotics for E colic bacteremia to cefazolin starting on 4/14.   ?  ?06/27/2021: spike in bilirubin to 4.1 today.  GI planning MRCP on 4/17.  Timing of cholecystectomy to be determined after further testing.   Pt developed worsening sepsis, spiked fever, ammonia elevated, WBC and procalcitonin rising. Transferred to stepdown ICU.  Spoke with son and DNR order confirmed.  Repeated blood cultures. Broadened antibiotics.   ?  ?06/28/2021:  Worsening encephalopathy today, platelets down to 52.  WBC and procalcitonin rising.  Conferenced with GI and surgery.  Recommending ERCP but GI reports he is too complex to have done [facility].  Ordered for [facility] or [facility] per GI. Jon Bennett [place].  I spoke with family if they wanted to proceed with ERCP.  I spoke with son Jon Bennett who is agreeable to ERCP understanding that there are significant risks involved.  He was agreeable to [place] [facility].  ?  ?06/29/2021: [facility].  Cancelled NPO order and allowed to eat.  Ammonia coming down.  I updated son Jon Bennett about situation with transfer. Pt clinically appears nontoxic.  Bilirubin remains elevated but slightly improved today.   ?  ?06/30/2021: bilirubin down to 2.6 today.  Encephalopathy improved and lactulose discontinued due to loose stools.   ? ?07/02/2021: MRCP significant for cholelithiasis, plan for ERCP  ? ?07/03/2021: Status post ERCP with Dr. Chales Bennett showed: ?- The major papilla was located entirely within a diverticulum. ?- Choledocholithiasis s/p biliary sphincterotomy with stone extraction. ?- Cholelithiasis with patent cystic duct ?- Moderate size hiatal hernia. ? ?07/05/2021: Hemoglobin low at 7.1,  received 2 units PRBC, no evidence of melena or bright red blood per rectum. ? ?07/06/2021: Hemoglobin is stable, Decision has been made by patient/surgical team/family not to pursue laparoscopic cholecystectomy during hospital stay, it can be done as an outpatient. ? ? ?Assessment & Plan: ?  ?Principal Problem: ?  Intractable nausea and vomiting ?Active Problems: ?  Thrombocytopenia (HCC) ?  GERD (gastroesophageal reflux disease) ?  Sepsis from E coli bacteremia  ?  Hypomagnesemia ?  Lactic acidosis ?  Hypercalcemia ?  Transaminitis ?  GI bleed ?  AKI (acute kidney injury) (  HCC) ?  Hypophosphatemia ?  Ascending cholangitis with severe sepsis ?  Chronic anticoagulation ?  Calculus of gallbladder with acute cholecystitis and obstruction ?  Hyperbilirubinemia ?  Hypokalemia ?  Pressure injury of skin ? ? ?Ascending cholangitis with severe sepsis ?-Enzymes appears to be improving, ultrasound prior to transfer from [facility] showing no biliary  dilation or choledocholithiasis. ?-Continue with IV Zosyn for another 5 days after ERCP 4/21 per GI recommendation ?- MRCP did show choledocholithiasis with a distal common duct stone approximately 7 mm and possibly smaller more proximal common duct stones without significant biliary dilatation.  LFTs continue to trend down. ?-ERCP 4/21 status post biliary sphincterectomy with stone extraction ?-Surgery consulted, lap cholecystectomy, surgery has been scheduled initially, but has been postponed upon patient request as he would like to have it done as an outpatient when he feels stronger, which seemed appropriate by general surgery as well.  ?- continue with IV Zosyn until 07/08/2021 ?  ?Calculus of gallbladder with acute cholecystitis and obstruction ?-Status post-ERCP with stone extraction, surgery consulted, ?-Patient would like to defer surgery until he is feeling better. ? ?Sepsis from E coli bacteremia  ?-Due to above, continue with IV Zosyn 07/08/2021 ? ?Intractable nausea and vomiting ?- due to above, resolved ?  ?Hypokalemia ?-- repleted  ?  ?Hyperbilirubinemia ?-Due to above, trending down ?  ?History of splenic vein thrombosis ?-antiCoagulation which has been held since admission anticipation for procedures and surgery ?-Now no further surgery is planned he will be resumed on his Eliquis tomorrow. 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 ? ?Hypophosphatemia ?-Repleted, recheck level again and replete as needed, he is high risk for refeeding syndrome. ?  ?AKI (acute kidney injury) (HCC) ?-Resolved with hydration ?  ? GI bleed ?--appreciate GI consultation, EGD 4/13 with findings of small mallory weiss tear ?--continue protonix as ordered for GI protection ?-Continue to monitor CBC  closely and transfuse as needed ?  ?Hypercalcemia ?--resolved after hydration with IV fluid ?  ?Lactic acidosis ?-- resolved with IV fluid ?  ?Hypomagnesemia ?- repleted  ? ?GERD (gastroesophageal reflux disease) ?--protonix for GI protection  ?  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 ?Hyponatremia ?-Improving ? ?Anemia of chronic illness and acute blood loss ?-With Mallory-Weiss tear initially, hemoglobin continues to trend down most likely in the setting of acute illness, was transfused 2 units PRBC for hemoglobin of 7.1 , good response with hemoglobin remained stable posttransfusion. ? ?Left Knee effusion ?-Patient complains of pain, left knee is swollen, tender to palpation, has significantly improved over last 48 hours, Ortho input greatly appreciated, at this point no indication for arthrocentesis, this can be reconsidered if pain/swelling or reoccurs.   ? ? ?  ? ? ?DVT prophylaxis: [place] Heparin, resume Eliquis this evening ?Code Status: DNR ?Family Communication: Discussed with his son Dr. Rosanne Ashing by phone daily ?Disposition:  ? ?Status is: Inpatient ? ?  ?Consultants:  ?GI  ?Palliaitve ?General surgery  ?orthopedic ? ? ?Subjective: ? ?No nausea, no vomiting, no further diarrhea, appetite has improved, left knee pain is minimal. ? ?Objective: ?Vitals:  ? 07/06/21 0500 07/06/21 0600 07/06/21 0815 07/06/21 1223  ?BP:   (!) 148/81 (!) 149/88  ?Pulse: 63 66 69 92  ?Resp: 18 17 20  (!) 30  ?  Temp:   97.8 ?F (36.6 ?C) 98 ?F (36.7 ?C)  ?TempSrc:   Oral Oral  ?SpO2: 94% 93% 93%   ?Weight:      ?Height:      ? ? ?Intake/Output Summary (Last 24 hours) at 07/06/2021 1346 ?Last data filed at 07/05/2021 2115 ?Gross per 24 hour  ?Intake 286 ml  ?Output 800 ml  ?Net -514 ml  ? ? ?Filed Weights  ? 06/28/21 0500 06/29/21 0500 06/30/21 0500  ?Weight: 69.3 kg 71.1 kg 71.1 kg  ? ? ?Examination: ? ?Awake Alert, Oriented X 3, No new F.N deficits, Normal affect, frail, deconditioned ?Symmetrical Chest wall  movement, Good air movement bilaterally, CTAB ?RRR,No Gallops,Rubs or new Murmurs, No Parasternal Heave ?+ve B.Sounds, Abd Soft, No tenderness, No rebound - guarding or rigidity. ?No Cyanosis, Clubbing or edema, left kn

## 2021-07-06 NOTE — Progress Notes (Signed)
Physical Therapy Treatment ?Patient Details ?Name: Jon Bennett ?MRN: [id number] ?DOB: 10/08/1934 ?Today's Date: 07/06/2021 ? ? ?History of Present Illness 87 y.o. male who presents to the emergency department due to several days of onset of nausea and vomiting and increased urinary frequency. +ascending cholangitis; transfer to [facility] 4/19; +encephalopathy; +swollen/warm left knee; ortho consulted and did not feel aspiration needed   PMH significant of type 2 diabetes mellitus, hypertension, splenic vein thrombosis, alcohol withdrawal, GERD ? ?  ?PT Comments  ? ? Patient moving LLE better today although remains painful. In sitting EOB able to flex knee to ~80 degrees. In standing, he shifts majority of his weight over his RLE. Unable to advance to pre-gait activities and returned to bed due to incontinence and needing to be cleaned. Nursing made aware.  ?   ?Recommendations for follow up therapy are one component of a multi-disciplinary discharge planning process, led by the attending physician.  Recommendations may be updated based on patient status, additional functional criteria and insurance authorization. ? ?Follow Up Recommendations ? Skilled nursing-short term rehab (<3 hours/day) ?  ?  ?Assistance Recommended at Discharge Frequent or constant Supervision/Assistance  ?Patient can return home with the following Two people to help with walking and/or transfers;Two people to help with bathing/dressing/bathroom;Assistance with cooking/housework;Direct supervision/assist for medications management;Direct supervision/assist for financial management;Assist for transportation;Help with stairs or ramp for entrance ?  ?Equipment Recommendations ? Other (comment) (TBD)  ?  ?Recommendations for Other Services   ? ? ?  ?Precautions / Restrictions Precautions ?Precautions: Fall;Other (comment) (L knee pain) ?Precaution Comments: painful, swollen left knee ?Restrictions ?Weight Bearing Restrictions: No  ?  ? ?Mobility ?  Bed Mobility ?Overal bed mobility: Needs Assistance ?Bed Mobility: Supine to Sit, Sit to Supine ?  ?  ?Supine to sit: Mod assist, HOB elevated ?Sit to supine: Mod assist, +2 for physical assistance ?  ?General bed mobility comments: pt able to move legs over EOB and assist to raise torso; to supine needed assist to control trunk and lift legs onto bed ?  ? ?Transfers ?Overall transfer level: Needs assistance ?Equipment used: Rolling walker (2 wheels) ?Transfers: Sit to/from Stand ?Sit to Stand: Mod assist, +2 physical assistance ?  ?  ?  ?  ? Lateral/Scoot Transfers:  (attempted along side of bed and pt unable) ?General transfer comment: 1st attempt only 1/2 standing with pt fearful of falling; 2nd attempt fully stood and maintained most of his weight on RLE for up to 90 seconds ?  ? ?Ambulation/Gait ?  ?  ?  ?  ?  ?  ?  ?  ? ? ?Stairs ?  ?  ?  ?  ?  ? ? ?Wheelchair Mobility ?  ? ?Modified Rankin (Stroke Patients Only) ?  ? ? ?  ?Balance Overall balance assessment: Needs assistance ?Sitting-balance support: Bilateral upper extremity supported, Feet supported ?Sitting balance-Leahy Scale: Poor ?  ?  ?Standing balance support: Bilateral upper extremity supported, Reliant on assistive device for balance ?Standing balance-Leahy Scale: Poor ?  ?  ?  ?  ?  ?  ?  ?  ?  ?  ?  ?  ?  ? ?  ?Cognition Arousal/Alertness: Awake/alert ?Behavior During Therapy: [facility] for tasks assessed/performed ?Overall Cognitive Status: No family/caregiver present to determine baseline cognitive functioning ?  ?  ?  ?  ?  ?  ?  ?  ?  ?  ?  ?  ?  ?  ?  ?  ?  General Comments: Pt oriented to place, self, and sitation today. ?  ?  ? ?  ?Exercises   ? ?  ?General Comments   ?  ?  ? ?Pertinent Vitals/Pain Pain Assessment ?Pain Assessment: Faces ?Faces Pain Scale: Hurts whole lot ?Pain Location: left knee ?Pain Descriptors / Indicators: Discomfort, Grimacing, Guarding ?Pain Intervention(s): Limited activity within patient's tolerance, Monitored during  session, Repositioned  ? ? ?Home Living   ?  ?  ?  ?  ?  ?  ?  ?  ?  ?   ?  ?Prior Function    ?  ?  ?   ? ?PT Goals (current goals can now be found in the care plan section) Acute Rehab PT Goals ?Patient Stated Goal: wants to go to rehab to get stronger ?Time For Goal Achievement: 07/16/21 ?Potential to Achieve Goals: Fair ?Progress towards PT goals: Progressing toward goals ? ?  ?Frequency ? ? ? Min 3X/week (until SNF confirmed) ? ? ? ?  ?PT Plan Current plan remains appropriate  ? ? ?Co-evaluation PT/OT/SLP Co-Evaluation/Treatment: Yes ?Reason for Co-Treatment: For patient/therapist safety;To address functional/ADL transfers ?PT goals addressed during session: Mobility/safety with mobility;Balance;Proper use of DME;Strengthening/ROM ?  ?  ? ?  ?AM-PAC PT "6 Clicks" Mobility   ?Outcome Measure ? Help needed turning from your back to your side while in a flat bed without using bedrails?: A Lot ?Help needed moving from lying on your back to sitting on the side of a flat bed without using bedrails?: A Lot ?Help needed moving to and from a bed to a chair (including a wheelchair)?: Total ?Help needed standing up from a chair using your arms (e.g., wheelchair or bedside chair)?: Total ?Help needed to walk in hospital room?: Total ?Help needed climbing 3-5 steps with a railing? : Total ?6 Click Score: 8 ? ?  ?End of Session   ?Activity Tolerance: Patient limited by pain ?Patient left: in bed;with call bell/phone within reach;with bed alarm set ?Nurse Communication: Mobility status;Other (comment) (was incontinent of bowels and needs assist to clean up) ?PT Visit Diagnosis: Muscle weakness (generalized) (M62.81);Pain ?Pain - Right/Left: Left ?Pain - part of body: Knee ?  ? ? ?Time: 1000-1031 ?PT Time Calculation (min) (ACUTE ONLY): 31 min ? ?Charges:  $Therapeutic Activity: 8-22 mins          ?          ? ? ?Arby Barrette, PT ?Acute Rehabilitation Services  ?Pager [phone] ?Office [phone] ? ? ? ?Jon Bennett  Jon Bennett ?07/06/2021, 10:48 AM ? ?

## 2021-07-06 NOTE — Progress Notes (Signed)
[facility] ?Progress Note ? ?3 Days Post-Op  ?Subjective: ?CC-  ?Patient still tired and hungry. States that he does not want surgery while he is here. Would prefer to go to rehab and follow up outpatient to discuss cholecystectomy. ?Denies abdominal pain. Tolerating diet. ? ?Objective: ?Vital signs in last 24 hours: ?Temp:  [97.8 ?F (36.6 ?C)-98.7 ?F (37.1 ?C)] 97.8 ?F (36.6 ?C) (04/24 0815) ?Pulse Rate:  [60-98] 69 (04/24 0815) ?Resp:  [15-21] 20 (04/24 0815) ?BP: (107-148)/(51-81) 148/81 (04/24 0815) ?SpO2:  [90 %-95 %] 93 % (04/24 0815) ?Last BM Date : 07/05/21 ? ?Intake/Output from previous day: ?04/23 0701 - 04/24 0700 ?In: 286 [I.V.:50; Blood:236] ?Out: 950 [Urine:800; Stool:150] ?Intake/Output this shift: ?No intake/output data recorded. ? ?PE: ?Gen:  Alert, NAD, pleasant ?Abd: soft, mild distension, nontender ? ?Lab Results:  ?Recent Labs  ?  07/05/21 ?0346 07/06/21 ?0345  ?WBC 5.1 6.2  ?HGB 7.1* 9.6*  ?HCT 21.6* 29.5*  ?PLT 169 195  ? ?BMET ?Recent Labs  ?  07/05/21 ?0346 07/06/21 ?0345  ?NA 136 138  ?K 3.2* 4.3  ?CL 103 106  ?CO2 26 26  ?GLUCOSE 108* 119*  ?BUN 17 11  ?CREATININE 0.81 0.78  ?CALCIUM 8.3* 8.5*  ? ?PT/INR ?No results for input(s): LABPROT, INR in the last 72 hours. ?CMP  ?   ?Component Value Date/Time  ? NA 138 07/06/2021 0345  ? K 4.3 07/06/2021 0345  ? CL 106 07/06/2021 0345  ? CO2 26 07/06/2021 0345  ? GLUCOSE 119 (H) 07/06/2021 0345  ? BUN 11 07/06/2021 0345  ? CREATININE 0.78 07/06/2021 0345  ? CALCIUM 8.5 (L) 07/06/2021 0345  ? PROT 5.3 (L) 07/06/2021 0345  ? ALBUMIN 2.7 (L) 07/06/2021 0345  ? AST 18 07/06/2021 0345  ? ALT 13 07/06/2021 0345  ? ALKPHOS 85 07/06/2021 0345  ? BILITOT 1.2 07/06/2021 0345  ? GFRNONAA >60 07/06/2021 0345  ? GFRAA >60 10/29/2019 0445  ? ?Lipase  ?   ?Component Value Date/Time  ? LIPASE 22 06/04/2021 0357  ? ? ? ? ? ?Studies/Results: ?No results found. ? ?Anti-infectives: ?Anti-infectives (From admission, onward)  ? ? Start     Dose/Rate Route  Frequency Ordered Stop  ? 06/28/21 1215  piperacillin-tazobactam (ZOSYN) IVPB 3.375 g       ? 3.375 g ?12.5 mL/hr over 240 Minutes Intravenous Every 8 hours 06/28/21 1120 07/08/21 2359  ? 06/27/21 1945  cefTRIAXone (ROCEPHIN) 2 g in sodium chloride 0.9 % 100 mL IVPB  Status:  Discontinued       ? 2 g ?200 mL/hr over 30 Minutes Intravenous Every 24 hours 06/27/21 1847 06/28/21 1120  ? 06/27/21 0800  ceFAZolin (ANCEF) IVPB 2g/100 mL premix  Status:  Discontinued       ? 2 g ?200 mL/hr over 30 Minutes Intravenous Every 8 hours 06/26/21 1520 06/27/21 1847  ? 06/25/21 1000  vancomycin (VANCOREADY) IVPB 1250 mg/250 mL  Status:  Discontinued       ? 1,250 mg ?166.7 mL/hr over 90 Minutes Intravenous Every 36 hours 06/23/21 2131 06/24/21 1537  ? 06/25/21 1000  cefTRIAXone (ROCEPHIN) 2 g in sodium chloride 0.9 % 100 mL IVPB  Status:  Discontinued       ? 2 g ?200 mL/hr over 30 Minutes Intravenous Every 24 hours 06/25/21 0909 06/26/21 1520  ? 06/24/21 1000  ceFEPIme (MAXIPIME) 2 g in sodium chloride 0.9 % 100 mL IVPB  Status:  Discontinued       ?  2 g ?200 mL/hr over 30 Minutes Intravenous Every 12 hours 06/23/21 2131 06/25/21 0909  ? 06/23/21 2045  vancomycin (VANCOREADY) IVPB 1250 mg/250 mL       ? 1,250 mg ?166.7 mL/hr over 90 Minutes Intravenous  Once 06/23/21 2037 06/23/21 2338  ? 06/23/21 2030  ceFEPIme (MAXIPIME) 2 g in sodium chloride 0.9 % 100 mL IVPB       ? 2 g ?200 mL/hr over 30 Minutes Intravenous  Once 06/23/21 2030 06/23/21 2129  ? 06/23/21 2030  metroNIDAZOLE (FLAGYL) IVPB 500 mg       ? 500 mg ?100 mL/hr over 60 Minutes Intravenous  Once 06/23/21 2030 06/23/21 2227  ? 06/23/21 2030  vancomycin (VANCOCIN) IVPB 1000 mg/200 mL premix  Status:  Discontinued       ? 1,000 mg ?200 mL/hr over 60 Minutes Intravenous  Once 06/23/21 2030 06/23/21 2037  ? ?  ? ? ? ?Assessment/Plan ?Cholangitis ?E coli bacteremia ?Cholelithiasis ?- MRCP 4/19 positive for choledocholithiasis with distal common duct stone of approximately 7  mm, possible smaller more proximal common duct stones ?- s/p ERCP 4/21: Choledocholithiasis s/p biliary sphincterotomy with stone extraction, cholelithiasis with patent cystic duct ?- I discussed with the patient and his son, Chanetta Marshall (on the phone), the risks and benefits surgery this admission vs waiting until after discharge to consider cholecystectomy. He understands the risk of recurrence without cholecystectomy. He would prefer to go to rehab and get stronger, then follow up in the office to discuss cholecystectomy. He can follow up with one of our surgeons or with Dr. Lovell Sheehan - patient will discuss with his other son, [name] (we could not reach him on the phone), who they would prefer to follow up with given that [name] helps take him to his appointments.  ?  ?ID - zosyn 4/16>> ?VTE - sq heparin ?FEN - IVF, regular diet ?Foley - none ?  ?GI bleed - EGD 4/13 with findings of small mallory weiss tear ?Splenic vein thrombosis on eliquis ?DM ?HTN ?Hx CVA ?Prior h/o alcohol abuse ?Thrombocytopenia ?Code status DNR ? ?I reviewed hospitalist notes, last 24 h vitals and pain scores, last 48 h intake and output, last 24 h labs and trends (CBC, CMP). ? ? LOS: 12 days  ? ? ?Franne Forts, PA-C ?[facility] ?07/06/2021, 8:51 AM ?Please see Amion for pager number during day hours 7:00am-4:30pm ? ?

## 2021-07-06 NOTE — Progress Notes (Signed)
Occupational Therapy Treatment ?Patient Details ?Name: Jon Bennett ?MRN: [id number] ?DOB: 12/22/1934 ?Today's Date: 07/06/2021 ? ? ?History of present illness 87 y.o. male who presents to the emergency department due to several days of onset of nausea and vomiting and increased urinary frequency. +ascending cholangitis; transfer to [facility] from [facility] 4/19; +encephalopathy; +swollen/warm left knee; ortho consulted and did not feel aspiration needed   PMH significant of type 2 diabetes mellitus, hypertension, splenic vein thrombosis, alcohol withdrawal, GERD ?  ?OT comments ? Patient progressing and showed improved ability to tolerate 1 full stand from EOB with Moderate assist of 2 people, in preparation for more OOB ADLs, compared to previous session. Pt also showed improved tolerance to LT knee flexion to allow for EOB sitting for several minutes, but balance remains poor with need of BUE support.  Patient remains limited by LT knee pain, generalized weakness and decreased activity tolerance, uncontrolled loose stools with need of total assist for hygiene, along with deficits noted below. Pt continues to demonstrate good rehab potential and would benefit from continued skilled OT to increase safety and independence with ADLs and functional transfers to allow pt to return home safely and reduce caregiver burden and fall risk. ?  ? ?Recommendations for follow up therapy are one component of a multi-disciplinary discharge planning process, led by the attending physician.  Recommendations may be updated based on patient status, additional functional criteria and insurance authorization. ?   ?Follow Up Recommendations ? Skilled nursing-short term rehab (<3 hours/day)  ?  ?Assistance Recommended at Discharge Frequent or constant Supervision/Assistance  ?Patient can return home with the following ? A lot of help with walking and/or transfers;A lot of help with bathing/dressing/bathroom;Assistance with cooking/housework;Help with  stairs or ramp for entrance ?  ?Equipment Recommendations ? BSC/3in1  ?  ?Recommendations for Other Services   ? ?  ?Precautions / Restrictions Precautions ?Precautions: Fall;Other (comment) ?Precaution Comments: painful, swollen left knee ?Restrictions ?Weight Bearing Restrictions: No  ? ? ?  ? ?Mobility Bed Mobility ?Overal bed mobility: Needs Assistance ?Bed Mobility: Supine to Sit, Sit to Supine ?  ?  ?Supine to sit: Mod assist, HOB elevated ?Sit to supine: Mod assist, +2 for physical assistance ?  ?General bed mobility comments: pt able to move legs over EOB and assist to raise torso; to supine needed assist to control trunk and lift legs onto bed ?  ? ?Transfers ?  ?  ?  ?  ?  ?  ?  ?  ?  ?  ?  ?  ?Balance Overall balance assessment: Needs assistance ?Sitting-balance support: Bilateral upper extremity supported, Feet supported ?Sitting balance-Leahy Scale: Poor ?  ?  ?Standing balance support: Bilateral upper extremity supported, Reliant on assistive device for balance ?Standing balance-Leahy Scale: Poor ?  ?  ?  ?  ?  ?  ?  ?  ?  ?  ?  ?  ?   ? ?ADL either performed or assessed with clinical judgement  ? ?ADL Overall ADL's : Needs assistance/impaired ?  ?  ?Grooming: Wash/dry hands;Wash/dry face;Sitting;Set up ?Grooming Details (indicate cue type and reason): Pt washed hands and face with setup of washcloth while pt in recliner. Pt applied lotion to hands with setup and cues to initiate. ?  ?  ?  ?Lower Body Bathing Details (indicate cue type and reason): Please see toileting below. ?Upper Body Dressing : Minimal assistance;Sitting ?Upper Body Dressing Details (indicate cue type and reason): Min As to don posterior gown while  EOB. ?  ?  ?Toilet Transfer: +2 for physical assistance;Moderate assistance;Cueing for sequencing;Cueing for safety ?Toilet Transfer Details (indicate cue type and reason): Pt performed 1st stand from EOB with Mod As of 2, verbalized fear of falling and initiated sit to EOB prior to  coming fully upright. Pt educated on safety and favoring RT side to manage LT LE pain. Pt stood 2nd time from elevated EOB to RW with Mod As of 2, and able to come fully upright.  Pt unable to take any steps with RW and was lowered back to EOB with Mod As of 2 for safety. ?Toileting- Architect and Hygiene: Total assistance;+2 for physical assistance;Sit to/from stand ?Toileting - Clothing Manipulation Details (indicate cue type and reason): Once pt stood from EOB, RN in room and noted pt having bowel movement.  Pt required assist of 2 people for safety with standing and BUE support on RW which RN provided Total Assist for posteior peri hygiene as well as hygiene to buttocks and upper legs. . ?  ?  ?  ?  ?  ? ?Extremity/Trunk Assessment Upper Extremity Assessment ?Upper Extremity Assessment: Generalized weakness ?  ?Lower Extremity Assessment ?Lower Extremity Assessment: Defer to PT evaluation ?  ?Cervical / Trunk Assessment ?Cervical / Trunk Assessment: Kyphotic ?  ? ?Vision   ?Vision Assessment?: No apparent visual deficits ?  ?Perception   ?  ?Praxis   ?  ? ?Cognition Arousal/Alertness: Awake/alert ?Behavior During Therapy: [facility] for tasks assessed/performed ?Overall Cognitive Status: No family/caregiver present to determine baseline cognitive functioning ?  ?  ?  ?  ?  ?  ?  ?  ?  ?  ?  ?  ?  ?  ?  ?  ?General Comments: Pt oriented to place, self, month/year and sitation today. ?  ?  ?   ?Exercises   ? ?  ?Shoulder Instructions   ? ? ?  ?General Comments Pt attempted lateral scooting along EOB but unable to advance despite pt pushing with BUEs.  ? ? ?Pertinent Vitals/ Pain       Pain Assessment ?Pain Assessment: Faces ?Faces Pain Scale: Hurts whole lot ?Pain Location: left knee ?Pain Descriptors / Indicators: Discomfort, Grimacing, Guarding ?Pain Intervention(s): Limited activity within patient's tolerance, Monitored during session, Repositioned ? ?Home Living   ?  ?  ?  ?  ?  ?  ?  ?  ?  ?  ?  ?  ?  ?   ?  ?  ?  ?  ? ?  ?Prior Functioning/Environment    ?  ?  ?  ?   ? ?Frequency ? Min 2X/week  ? ? ? ? ?  ?Progress Toward Goals ? ?OT Goals(current goals can now be found in the care plan section) ? Progress towards OT goals: Progressing toward goals ? ?Acute Rehab OT Goals ?Patient Stated Goal: LT knee pain managed ?OT Goal Formulation: With patient ?Time For Goal Achievement: 07/17/21 ?Potential to Achieve Goals: Good  ?Plan Discharge plan remains appropriate   ? ?Co-evaluation ? ? ? PT/OT/SLP Co-Evaluation/Treatment: Yes ?Reason for Co-Treatment: For patient/therapist safety;To address functional/ADL transfers ?PT goals addressed during session: Mobility/safety with mobility ?OT goals addressed during session: ADL's and self-care ?  ? ?  ?AM-PAC OT "6 Clicks" Daily Activity     ?Outcome Measure ? ? Help from another person eating meals?: None ?Help from another person taking care of personal grooming?: None ?Help from another person toileting, which includes using toliet,  bedpan, or urinal?: Total ?Help from another person bathing (including washing, rinsing, drying)?: A Lot ?Help from another person to put on and taking off regular upper body clothing?: A Little ?Help from another person to put on and taking off regular lower body clothing?: A Lot ?6 Click Score: 16 ? ?  ?End of Session Equipment Utilized During Treatment: Gait belt;Rolling walker (2 wheels) ? ?OT Visit Diagnosis: Other abnormalities of gait and mobility (R26.89) ?  ?Activity Tolerance Patient limited by pain ?  ?Patient Left in bed;with call bell/phone within reach;with bed alarm set ?  ?Nurse Communication Mobility status ?  ? ?   ? ?Time: [id number] ?OT Time Calculation (min): 24 min ? ?Charges: OT General Charges ?$OT Visit: 1 Visit ?OT Treatments ?$Self Care/Home Management : 8-22 mins ? ?Victorino DikeJennifer, OT ?Acute Rehab Services ?Office: [phone] ?07/06/2021 ?Theodoro ClockJennifer  Paulette Lynch ?07/06/2021, 12:22 PM ?

## 2021-07-06 NOTE — Progress Notes (Signed)
Both midodrine doses held today due to appropriate BP. See flowsheet vitals. ?

## 2021-07-07 ENCOUNTER — Encounter (HOSPITAL_COMMUNITY): Payer: Self-pay | Admitting: Gastroenterology

## 2021-07-07 DIAGNOSIS — K8309 Other cholangitis: Secondary | ICD-10-CM | POA: Diagnosis not present

## 2021-07-07 DIAGNOSIS — K8001 Calculus of gallbladder with acute cholecystitis with obstruction: Secondary | ICD-10-CM | POA: Diagnosis not present

## 2021-07-07 LAB — COMPREHENSIVE METABOLIC PANEL
ALT: 12 U/L (ref 0–44)
AST: 18 U/L (ref 15–41)
Albumin: 2.7 g/dL — ABNORMAL LOW (ref 3.5–5.0)
Alkaline Phosphatase: 93 U/L (ref 38–126)
Anion gap: 6 (ref 5–15)
BUN: 5 mg/dL — ABNORMAL LOW (ref 8–23)
CO2: 24 mmol/L (ref 22–32)
Calcium: 8.7 mg/dL — ABNORMAL LOW (ref 8.9–10.3)
Chloride: 105 mmol/L (ref 98–111)
Creatinine, Ser: 0.82 mg/dL (ref 0.61–1.24)
GFR, Estimated: >60 mL/min (ref >60)
Glucose, Bld: 111 mg/dL — ABNORMAL HIGH (ref 70–99)
Potassium: 4 mmol/L (ref 3.5–5.1)
Sodium: 135 mmol/L (ref 135–145)
Total Bilirubin: 1.4 mg/dL — ABNORMAL HIGH (ref 0.3–1.2)
Total Protein: 5.8 g/dL — ABNORMAL LOW (ref 6.5–8.1)

## 2021-07-07 LAB — CBC
HCT: 31.1 % — ABNORMAL LOW (ref 39.0–52.0)
Hemoglobin: 10.5 g/dL — ABNORMAL LOW (ref 13.0–17.0)
MCH: 31.4 pg (ref 26.0–34.0)
MCHC: 33.8 g/dL (ref 30.0–36.0)
MCV: 93.1 fL (ref 80.0–100.0)
Platelets: 195 10*3/uL (ref 150–400)
RBC: 3.34 MIL/uL — ABNORMAL LOW (ref 4.22–5.81)
RDW: 14.6 % (ref 11.5–15.5)
WBC: 6.6 10*3/uL (ref 4.0–10.5)
nRBC: 0 % (ref 0.0–0.2)

## 2021-07-07 LAB — MAGNESIUM: Magnesium: 1.9 mg/dL (ref 1.7–2.4)

## 2021-07-07 LAB — PHOSPHORUS: Phosphorus: 2.6 mg/dL (ref 2.5–4.6)

## 2021-07-07 MED ORDER — LOPERAMIDE HCL 2 MG PO CAPS
2.0000 mg | ORAL_CAPSULE | Freq: Once | ORAL | Status: AC
  Administered 2021-07-07: 2 mg via ORAL
  Filled 2021-07-07: qty 1

## 2021-07-07 MED ORDER — MIDODRINE HCL 5 MG PO TABS
2.5000 mg | ORAL_TABLET | Freq: Two times a day (BID) | ORAL | Status: DC
  Administered 2021-07-07: 2.5 mg via ORAL
  Filled 2021-07-07 (×2): qty 1

## 2021-07-07 MED ORDER — APIXABAN 5 MG PO TABS
5.0000 mg | ORAL_TABLET | Freq: Two times a day (BID) | ORAL | Status: DC
Start: 2021-07-07 — End: 2021-07-09
  Administered 2021-07-07 – 2021-07-08 (×2): 5 mg via ORAL
  Filled 2021-07-07 (×2): qty 1

## 2021-07-07 NOTE — TOC Progression Note (Addendum)
Transition of Care (TOC) - Progression Note  ? ? ?Patient Details  ?Name: Jon Bennett ?MRN: [id number] ?Date of Birth: [birth year] ? ?Transition of Care (TOC) CM/SW Contact  ?Benard Halsted, LCSW ?Phone Number: ?07/07/2021, 1:24 PM ? ?Clinical Narrative:    ?10am-CSW spoke with patient's son, Jon Bennett, and made him aware that [facility] is unable to offer on patient. CSW emailed him other bed offers and submitted insurance authorization pending a bed choice.  ? ?3pm-Mark has selected [facility]. CSW confirmed they have a private room. CSW will update insurance.  ? ? ?Expected Discharge Plan: [place] ?Barriers to Discharge: Continued Medical Work up, Ship broker, SNF Pending bed offer ? ?Expected Discharge Plan and Services ?Expected Discharge Plan: [place] ?In-house Referral: Clinical Social Work ?  ?  ?Living arrangements for the past 2 months: [place] ?                ?  ?  ?  ?  ?  ?  ?  ?  ?  ?  ? ? ?Social Determinants of Health (SDOH) Interventions ?  ? ?Readmission Risk Interventions ? ?  07/03/2021  ?  2:10 PM 06/26/2021  ?  3:09 PM 08/27/2020  ? 11:33 AM  ?Readmission Risk Prevention Plan  ?Transportation Screening Complete Complete Complete  ?PCP or Specialist Appt within 5-7 Days Complete  Complete  ?Home Care Screening Complete  Complete  ?Medication Review (RN CM) Complete  Complete  ?[place] or Home Care Consult  Complete   ?Palliative Care Screening  Not Applicable   ?Medication Review Press photographer)  Complete   ? ? ?

## 2021-07-07 NOTE — Progress Notes (Signed)
?PROGRESS NOTE ? ? ? ?Jon RainwaterJames D Bennett  [id number] DOB: 09/14/1934 DOA: 06/23/2021 ?PCP: Julieanne CottonPaul, [name], MD  ? ? ?Chief Complaint  ?Patient presents with  ? Vomiting  ? ? ?Brief Narrative:  ? ?87 y.o. male with medical history significant of type 2 diabetes mellitus, hypertension, splenic vein thrombosis, alcohol withdrawal, GERD who presents to the emergency department due to several days of onset of nausea and vomiting and increased urinary frequency.  He denies any abdominal pain, chest pain, headache, blurry vision, shortness of breath.  Patient states that he has abstained from alcohol for several months. Patient was seen in the ED on 3/23 due to alcoholic gastritis, he was treated in the ED and discharged home.  Apparently, patient had a pint of vodka about 4 days prior to the ED visit.  ?-He was noted to be febrile, tachypneic and tachycardic, work-up significant for ascending cholangitis, was admitted to [facility], but he was [facility] 07/[facility] for further management per GI. ?    ?His blood cultures 4/11 positive for E coli. Pharmacy changed abx to ceftriaxone on 4/13.    ?  ?06/26/2021 : surgery planning for cholecystectomy in next couple of days. Narrowed antibiotics for E colic bacteremia to cefazolin starting on 4/14.   ?  ?06/27/2021: spike in bilirubin to 4.1 today.  GI planning MRCP on 4/17.  Timing of cholecystectomy to be determined after further testing.   Pt developed worsening sepsis, spiked fever, ammonia elevated, WBC and procalcitonin rising. Transferred to stepdown ICU.  Spoke with son and DNR order confirmed.  Repeated blood cultures. Broadened antibiotics.   ?  ?06/28/2021:  Worsening encephalopathy today, platelets down to 52.  WBC and procalcitonin rising.  Conferenced with GI and surgery.  Recommending ERCP but GI reports he is too complex to have done at [facility].  Ordered for [facility] or [facility] per GI. Dr. Marletta Lorarver reaching out to GI team in  [place].  I spoke with family if they wanted to proceed with ERCP.  I spoke with son Loraine LericheMark who is agreeable to ERCP understanding that there are significant risks involved.  He was agreeable to [facility].  ?  ?06/29/2021: pt [facility].  Cancelled NPO order and allowed to eat.  Ammonia coming down.  I updated son Loraine LericheMark about situation with transfer. Pt clinically appears nontoxic.  Bilirubin remains elevated but slightly improved today.   ?  ?06/30/2021: bilirubin down to 2.6 today.  Encephalopathy improved and lactulose discontinued due to loose stools.   ? ?07/02/2021: MRCP significant for cholelithiasis, plan for ERCP  ? ?07/03/2021: Status post ERCP with Dr. Chales AbrahamsGupta showed: ?- The major papilla was located entirely within a diverticulum. ?- Choledocholithiasis s/p biliary sphincterotomy with stone extraction. ?- Cholelithiasis with patent cystic duct ?- Moderate size hiatal hernia. ? ?07/05/2021: Hemoglobin low at 7.1,  received 2 units PRBC, no evidence of melena or bright red blood per rectum. ? ?07/06/2021: Hemoglobin is stable, Decision has been made by patient/surgical team/family not to pursue laparoscopic cholecystectomy during hospital stay, it can be done as an outpatient. ? ? ?Assessment & Plan: ?  ?Principal Problem: ?  Intractable nausea and vomiting ?Active Problems: ?  Thrombocytopenia (HCC) ?  GERD (gastroesophageal reflux disease) ?  Sepsis from E coli bacteremia  ?  Hypomagnesemia ?  Lactic acidosis ?  Hypercalcemia ?  Transaminitis ?  GI bleed ?  AKI (acute kidney injury) (  HCC) ?  Hypophosphatemia ?  Ascending cholangitis with severe sepsis ?  Chronic anticoagulation ?  Calculus of gallbladder with acute cholecystitis and obstruction ?  Hyperbilirubinemia ?  Hypokalemia ?  Pressure injury of skin ? ? ?Ascending cholangitis with severe sepsis ?-Enzymes appears to be improving, ultrasound prior to transfer from [facility] showing no biliary  dilation or choledocholithiasis. ?-Continue with IV Zosyn for another 5 days after ERCP 4/21 per GI recommendation ?- MRCP did show choledocholithiasis with a distal common duct stone approximately 7 mm and possibly smaller more proximal common duct stones without significant biliary dilatation.  LFTs continue to trend down. ?-ERCP 4/21 status post biliary sphincterectomy with stone extraction ?-Surgery consulted, lap cholecystectomy, surgery has been scheduled initially, but has been postponed upon patient request as he would like to have it done as an outpatient when he feels stronger, which seemed appropriate by general surgery as well.  ?- continue with IV Zosyn until 07/08/2021 ?  ?Calculus of gallbladder with acute cholecystitis and obstruction ?-Status post-ERCP with stone extraction, surgery consulted, ?-Patient would like to defer surgery until he is feeling better. ? ?Sepsis from E coli bacteremia  ?-Due to above, continue with IV Zosyn 07/08/2021 ? ?Intractable nausea and vomiting ?- due to above, resolved ?  ?Hypokalemia ?-- repleted  ?  ?Hyperbilirubinemia ?-Due to above, trending down ?  ?History of splenic vein thrombosis ?-antiCoagulation which has been held since admission anticipation for procedures and surgery ?-Now no further surgery is planned he will be resumed on his Eliquis today. ? 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   ? ?Hypophosphatemia ?-Repleted, recheck level again and replete as needed, he is high risk for refeeding syndrome. ?  ?AKI (acute kidney injury) (HCC) ?-Resolved with hydration ?  ? GI bleed ?--appreciate GI consultation, EGD 4/13 with findings of small mallory weiss tear ?--continue protonix as ordered for GI protection ?-Continue to monitor CBC  closely and transfuse as needed ?  ?Hypercalcemia ?--resolved after hydration with IV fluid ?  ?Lactic acidosis ?-- resolved with IV fluid ?  ?Hypomagnesemia ?- repleted  ? ?GERD (gastroesophageal reflux disease) ?--protonix for GI protection  ?  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 ?Hyponatremia ?-Improving ? ?Anemia of chronic illness and acute blood loss ?-With Mallory-Weiss tear initially, hemoglobin continues to trend down most likely in the setting of acute illness, was transfused 2 units PRBC for hemoglobin of 7.1 , good response with hemoglobin remained stable posttransfusion. ? ?Left Knee effusion ?-Patient complains of pain, left knee is swollen, tender to palpation, has significantly improved over last 48 hours, Ortho input greatly appreciated, at this point no indication for arthrocentesis, this can be reconsidered if pain/swelling or reoccurs.   ? ? ?  ? ? ?DVT prophylaxis: [place] Heparin, resume Eliquis this evening ?Code Status: DNR ?Family Communication: Discussed with his son Dr. Rosanne Ashing by phone daily ?Disposition:  ? ?Status is: Inpatient ? ?  ?Consultants:  ?GI  ?Palliaitve ?General surgery  ?orthopedic ? ? ?Subjective: ? ?No nausea, no vomiting, no further diarrhea, appetite has improved, left knee pain is minimal. ? ?Objective: ?Vitals:  ? 07/07/21 0600 07/07/21 0800 07/07/21 1201 07/07/21 1546  ?BP:  (!) 151/70 (!) 173/84 113/63  ?Pulse:  70 75 91  ?Resp: 14 (!)  21 18 (!) 22  ?Temp:  98.4 ?F (36.9 ?C) 97.9 ?F (36.6 ?C) 97.9 ?F (36.6 ?C)  ?TempSrc:  Oral Oral Oral  ?SpO2: 98%     ?Weight:      ?Height:      ? ? ?Intake/Output Summary (Last 24 hours) at 07/07/2021 1658 ?Last data filed at 07/07/2021 1328 ?Gross per 24 hour  ?Intake 1550.58 ml  ?Output 1700 ml  ?Net -149.42 ml  ? ? ?Filed Weights  ? 06/28/21 0500 06/29/21 0500 06/30/21 0500  ?Weight: 69.3 kg 71.1 kg 71.1 kg  ? ? ?Examination: ? ?Awake Alert, Oriented X 3, No new F.N deficits, Normal affect, frail,  deconditioned ?Symmetrical Chest wall movement, Good air movement bilaterally, CTAB ?RRR,No Gallops,Rubs or new Murmurs, No Parasternal Heave ?+ve B.Sounds, Abd Soft, No tenderness, No rebound - guarding or rigidity. ?N

## 2021-07-07 NOTE — Progress Notes (Signed)
Palliative: ? ?Discussed with Dr. Waldron Labs. Medical team has been discussing with family plans. They are working on discharge plans now. I would recommend consideration of outpatient palliative to follow. No further needs for acute palliative care. Please re-consult as needed.  ? ?No charge ? ?Vinie Sill, NP ?Palliative Medicine Team ?Pager [phone] (Please see [email or web address] for schedule) ?Team Phone [phone]  ? ?

## 2021-07-07 NOTE — Progress Notes (Signed)
Patient states that he would prefer to follow up with surgery closer to home rather than in [place]. He saw Dr. Arnoldo Morale at [facility], and may follow up as outpatient with him to discuss cholecystectomy. ? ?Wellington Hampshire, PA-C ?[facility] ?07/07/2021, 1:46 PM ?Please see Amion for pager number during day hours 7:00am-4:30pm ? ?

## 2021-07-08 DIAGNOSIS — R41841 Cognitive communication deficit: Secondary | ICD-10-CM | POA: Diagnosis not present

## 2021-07-08 DIAGNOSIS — K8001 Calculus of gallbladder with acute cholecystitis with obstruction: Secondary | ICD-10-CM | POA: Diagnosis not present

## 2021-07-08 DIAGNOSIS — M6281 Muscle weakness (generalized): Secondary | ICD-10-CM | POA: Diagnosis not present

## 2021-07-08 DIAGNOSIS — Z743 Need for continuous supervision: Secondary | ICD-10-CM | POA: Diagnosis not present

## 2021-07-08 DIAGNOSIS — R1311 Dysphagia, oral phase: Secondary | ICD-10-CM | POA: Diagnosis not present

## 2021-07-08 DIAGNOSIS — N179 Acute kidney failure, unspecified: Secondary | ICD-10-CM | POA: Diagnosis not present

## 2021-07-08 DIAGNOSIS — E440 Moderate protein-calorie malnutrition: Secondary | ICD-10-CM | POA: Diagnosis not present

## 2021-07-08 DIAGNOSIS — K922 Gastrointestinal hemorrhage, unspecified: Secondary | ICD-10-CM | POA: Diagnosis not present

## 2021-07-08 DIAGNOSIS — R000 Tachycardia, unspecified: Secondary | ICD-10-CM | POA: Diagnosis not present

## 2021-07-08 DIAGNOSIS — R112 Nausea with vomiting, unspecified: Secondary | ICD-10-CM | POA: Diagnosis not present

## 2021-07-08 DIAGNOSIS — Z741 Need for assistance with personal care: Secondary | ICD-10-CM | POA: Diagnosis not present

## 2021-07-08 DIAGNOSIS — M25562 Pain in left knee: Secondary | ICD-10-CM | POA: Diagnosis not present

## 2021-07-08 DIAGNOSIS — D649 Anemia, unspecified: Secondary | ICD-10-CM | POA: Diagnosis not present

## 2021-07-08 DIAGNOSIS — K8309 Other cholangitis: Secondary | ICD-10-CM | POA: Diagnosis not present

## 2021-07-08 DIAGNOSIS — R7401 Elevation of levels of liver transaminase levels: Secondary | ICD-10-CM | POA: Diagnosis not present

## 2021-07-08 DIAGNOSIS — A4151 Sepsis due to Escherichia coli [E. coli]: Secondary | ICD-10-CM | POA: Diagnosis not present

## 2021-07-08 DIAGNOSIS — R2681 Unsteadiness on feet: Secondary | ICD-10-CM | POA: Diagnosis not present

## 2021-07-08 DIAGNOSIS — E119 Type 2 diabetes mellitus without complications: Secondary | ICD-10-CM | POA: Diagnosis not present

## 2021-07-08 DIAGNOSIS — R279 Unspecified lack of coordination: Secondary | ICD-10-CM | POA: Diagnosis not present

## 2021-07-08 DIAGNOSIS — I82890 Acute embolism and thrombosis of other specified veins: Secondary | ICD-10-CM | POA: Diagnosis not present

## 2021-07-08 LAB — CBC
HCT: 31.8 % — ABNORMAL LOW (ref 39.0–52.0)
Hemoglobin: 10.7 g/dL — ABNORMAL LOW (ref 13.0–17.0)
MCH: 31.4 pg (ref 26.0–34.0)
MCHC: 33.6 g/dL (ref 30.0–36.0)
MCV: 93.3 fL (ref 80.0–100.0)
Platelets: 165 10*3/uL (ref 150–400)
RBC: 3.41 MIL/uL — ABNORMAL LOW (ref 4.22–5.81)
RDW: 14.4 % (ref 11.5–15.5)
WBC: 6.5 10*3/uL (ref 4.0–10.5)
nRBC: 0 % (ref 0.0–0.2)

## 2021-07-08 LAB — PHOSPHORUS: Phosphorus: 2.6 mg/dL (ref 2.5–4.6)

## 2021-07-08 LAB — COMPREHENSIVE METABOLIC PANEL
ALT: 12 U/L (ref 0–44)
AST: 16 U/L (ref 15–41)
Albumin: 2.6 g/dL — ABNORMAL LOW (ref 3.5–5.0)
Alkaline Phosphatase: 89 U/L (ref 38–126)
Anion gap: 6 (ref 5–15)
BUN: 5 mg/dL — ABNORMAL LOW (ref 8–23)
CO2: 25 mmol/L (ref 22–32)
Calcium: 8.9 mg/dL (ref 8.9–10.3)
Chloride: 106 mmol/L (ref 98–111)
Creatinine, Ser: 1.02 mg/dL (ref 0.61–1.24)
GFR, Estimated: >60 mL/min (ref >60)
Glucose, Bld: 110 mg/dL — ABNORMAL HIGH (ref 70–99)
Potassium: 4 mmol/L (ref 3.5–5.1)
Sodium: 137 mmol/L (ref 135–145)
Total Bilirubin: 1.4 mg/dL — ABNORMAL HIGH (ref 0.3–1.2)
Total Protein: 5.9 g/dL — ABNORMAL LOW (ref 6.5–8.1)

## 2021-07-08 LAB — MAGNESIUM: Magnesium: 2 mg/dL (ref 1.7–2.4)

## 2021-07-08 MED ORDER — LIDOCAINE 5 % EX PTCH: 1.0000 | MEDICATED_PATCH | CUTANEOUS | 0 refills | Status: DC

## 2021-07-08 MED ORDER — FOLIC ACID 1 MG PO TABS: 1.0000 mg | ORAL_TABLET | Freq: Every day | ORAL | Status: AC

## 2021-07-08 MED ORDER — ENSURE ENLIVE PO LIQD: 237.0000 mL | Freq: Three times a day (TID) | ORAL | 12 refills | Status: DC

## 2021-07-08 MED ORDER — KETOROLAC TROMETHAMINE 15 MG/ML IJ SOLN
INTRAMUSCULAR | Status: AC
  Filled 2021-07-08: qty 1

## 2021-07-08 MED ORDER — PANTOPRAZOLE SODIUM 40 MG PO TBEC: 40.0000 mg | DELAYED_RELEASE_TABLET | Freq: Two times a day (BID) | ORAL | 0 refills | Status: DC

## 2021-07-08 MED ORDER — ADULT MULTIVITAMIN W/MINERALS CH: 1.0000 | ORAL_TABLET | Freq: Every day | ORAL | Status: AC

## 2021-07-08 NOTE — Discharge Summary (Signed)
? ?PATIENT DETAILS ?Name: Jon Bennett ?Age: 87 y.o. ?Sex: male ?Date of Birth: [birth year] ?MRN: [id number]. ?Admitting Physician: Murlean Iba, MD ?[id number], Christia Reading, MD ? ?Admit Date: 06/23/2021 ?Discharge date: 07/08/2021 ? ?Recommendations for Outpatient Follow-up:  ?Follow up with PCP in 1-2 weeks ?Please obtain CMP/CBC in one week ?Please ensure follow-up with general surgery for elective outpatient cholecystectomy. ?Please ensure follow-up with gastroenterology ? ?Admitted From:  ?Home ? ?Disposition: ?Skilled nursing facility ?  ?Discharge Condition: ?good ? ?CODE STATUS: ?  Code Status: DNR  ? ?Diet recommendation:  ?Diet Order   ? ?       ?  Diet - low sodium heart healthy       ?  ?  Diet regular Room service appropriate? Yes; Fluid consistency: Thin  Diet effective now       ?  ? ?  ?  ? ?  ?  ? ?Brief Summary: ?87 year old with a history of DM-2, HTN, splenic vein thrombosis-who presented to the hospital with sepsis due to cholangitis and E. coli bacteremia-managed with broad-spectrum IV antimicrobial therapy-evaluated by GI underwent ERCP on 4/21.  After extensive discussion with patient/family-plans are for outpatient elective cholecystectomy.  See below for further details. ? ? ?Brief Hospital Course: ?Severe sepsis due to E. coli bacteremia and ascending cholangitis: Managed with supportive care-IV antibiotics-GI/general surgery followed closely.  MRCP did not show choledocholithiasis-subsequently underwent ERCP with sphincterotomy and stone extraction.  Initially inpatient cholecystectomy was planned-however after extensive discussion with patient/family by general surgery-it is now postponed until patient's debility improves further.  Please ensure follow-up with general surgery-Dr. Arnoldo Morale in [place].  He will complete IV Zosyn on 4/26-he probably does not require any further antimicrobial therapy on discharge. ? ?Intractable nausea/vomiting: Due to above-resolved. ? ?Upper GI bleeding:  Cardiac GI consulted-small Mallory-Weiss tear seen on EGD.  CBC now stable-continue Protonix. ? ?AKI: Hemodynamically mediated-resolved with supportive care. ? 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 ? ?Left knee effusion: Ortho consulted-no indication for arthrocentesis-exam appears benign. ? 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 ? ?Pressure Ulcer: ?Pressure Injury 07/04/21 Sacrum Right;Left;Medial Stage 1 -  Intact skin with non-blanchable redness of a localized area usually over a bony prominence. (Active)  ?07/04/21   ?Location: Sacrum  ?Location Orientation: Right;Left;Medial  ?Staging: Stage 1 -  Intact skin with non-blanchable redness of a localized area usually over a bony prominence.  ?Wound Description (Comments):   ?Present on Admission: No  ?Dressing Type Foam - Lift dressing to assess site every shift 07/07/21 2000  ? ?BMI: ?Estimated body mass index is 24.19 kg/m? as calculated from the following: ?  Height as of this encounter: 5' 7.5" (1.715 m). ?  Weight as of this encounter: 71.1 kg.  ? ?RN pressure injury documentation: ?Pressure Injury 07/04/21 Sacrum Right;Left;Medial Stage 1 -  Intact skin with non-blanchable redness of a localized area usually over a bony prominence. (Active)  ?07/04/21   ?Location: Sacrum  ?Location Orientation: Right;Left;Medial  ?Staging: Stage 1 -  Intact skin with non-blanchable redness of a localized area usually over a bony prominence.  ?Wound Description (Comments):   ?Present on Admission: No  ?Dressing Type Foam - Lift dressing to assess site every shift 07/07/21 2000  ? ? ?Discharge Diagnoses:  ?Principal Problem: ?  Intractable nausea and vomiting ?Active Problems: ?  Thrombocytopenia ([place]) ?  GERD (gastroesophageal reflux disease) ?  Sepsis from E coli  bacteremia  ?  Hypomagnesemia ?  Lactic acidosis ?  Hypercalcemia ?   Transaminitis ?  GI bleed ?  AKI (acute kidney injury) ([place]) ?  Hypophosphatemia ?  Ascending cholangitis with severe sepsis ?  Chronic anticoagulation ?  Calculus of gallbladder with acute cholecystitis and obstruction ?  Hyperbilirubinemia ?  Hypokalemia ?  Pressure injury of skin ? ? ?Discharge Instructions: ? ?Activity:  ?As tolerated with Full fall precautions use walker/cane & assistance as needed ? ? ?Discharge Instructions   ? ? Call MD for:  persistant nausea and vomiting   Complete by: As directed ?  ? Call MD for:  severe uncontrolled pain   Complete by: As directed ?  ? Diet - low sodium heart healthy   Complete by: As directed ?  ? Discharge instructions   Complete by: As directed ?  ? Follow with Primary MD  Mertha Baars, MD in 1-2 weeks ? ?Please follow-up with general surgeon-Dr. Arnoldo Morale in the next 1-2 weeks. ? ?Follow-up with GI clinic (Dr. Pennelope Bracken) in the next 2-3 weeks. ?Please get a complete blood count and chemistry panel checked by your Primary MD at your next visit, and again as instructed by your Primary MD. ? ?Get Medicines reviewed and adjusted: ?Please take all your medications with you for your next visit with your Primary MD ? ?Laboratory/radiological data: ?Please request your Primary MD to go over all hospital tests and procedure/radiological results at the follow up, please ask your Primary MD to get all Hospital records sent to his/her office. ? ?In some cases, they will be blood work, cultures and biopsy results pending at the time of your discharge. Please request that your primary care M.D. follows up on these results. ? ?Also Note the following: ?If you experience worsening of your admission symptoms, develop shortness of breath, life threatening emergency, suicidal or homicidal thoughts you must seek medical attention immediately by calling 911 or calling your MD immediately  if symptoms less severe. ? ?You must read complete instructions/literature along with all the  possible adverse reactions/side effects for all the Medicines you take and that have been prescribed to you. Take any new Medicines after you have completely understood and accpet all the possible adverse reactions/side effects.  ? ?Do not drive when taking Pain medications or sleeping medications (Benzodaizepines) ? ?Do not take more than prescribed Pain, Sleep and Anxiety Medications. It is not advisable to combine anxiety,sleep and pain medications without talking with your primary care practitioner ? ?Special Instructions: If you have smoked or chewed Tobacco  in the last 2 yrs please stop smoking, stop any regular Alcohol  and or any Recreational drug use. ? ?Wear Seat belts while driving. ? ?Please note: ?You were cared for by a hospitalist during your hospital stay. Once you are discharged, your primary care physician will handle any further medical issues. Please note that NO REFILLS for any discharge medications will be authorized once you are discharged, as it is imperative that you return to your primary care physician (or establish a relationship with a primary care physician if you do not have one) for your post hospital discharge needs so that they can reassess your need for medications and monitor your lab values.  ? Increase activity slowly   Complete by: As directed ?  ? No wound care   Complete by: As directed ?  ? ?  ? ?Allergies as of 07/08/2021   ? ?   Reactions  ? Asa [aspirin] Shortness  Of Breath  ? ?  ? ?  ?Medication List  ?  ? ?STOP taking these medications   ? ?acamprosate 333 MG tablet ?Commonly known as: CAMPRAL ?  ?busPIRone 10 MG tablet ?Commonly known as: BUSPAR ?  ?diclofenac Sodium 1 % Gel ?Commonly known as: VOLTAREN ?  ?doxepin 25 MG capsule ?Commonly known as: SINEQUAN ?  ?famotidine 20 MG tablet ?Commonly known as: PEPCID ?  ?HYDROcodone-acetaminophen 5-325 MG tablet ?Commonly known as: NORCO/VICODIN ?  ?metoprolol tartrate 25 MG tablet ?Commonly known as: LOPRESSOR ?   ?ondansetron 4 MG disintegrating tablet ?Commonly known as: ZOFRAN-ODT ?  ?sertraline 100 MG tablet ?Commonly known as: ZOLOFT ?  ?vitamin B-12 1000 MCG tablet ?Commonly known as: CYANOCOBALAMIN ?  ?Vitamin D (Er

## 2021-07-08 NOTE — TOC Progression Note (Signed)
Transition of Care (TOC) - Progression Note  ? ? ?Patient Details  ?Name: Jon Bennett ?MRN: [id number] ?Date of Birth: 1934/04/08 ? ?Transition of Care (TOC) CM/SW Contact  ?Mearl Latin, LCSW ?Phone Number: ?07/08/2021, 10:40 AM ? ?Clinical Narrative:    ?Insurance approval received for [facility], [id number], effective  ?07/08/2021-07/10/2021. ? ? ?Expected Discharge Plan: Skilled Nursing Facility ?Barriers to Discharge: Continued Medical Work up ? ?Expected Discharge Plan and Services ?Expected Discharge Plan: Skilled Nursing Facility ?In-house Referral: Clinical Social Work ?  ?  ?Living arrangements for the past 2 months: Single Family Home ?                ?  ?  ?  ?  ?  ?  ?  ?  ?  ?  ? ? ?Social Determinants of Health (SDOH) Interventions ?  ? ?Readmission Risk Interventions ? ?  07/03/2021  ?  2:10 PM 06/26/2021  ?  3:09 PM 08/27/2020  ? 11:33 AM  ?Readmission Risk Prevention Plan  ?Transportation Screening Complete Complete Complete  ?PCP or Specialist Appt within 5-7 Days Complete  Complete  ?Home Care Screening Complete  Complete  ?Medication Review (RN CM) Complete  Complete  ?HRI or Home Care Consult  Complete   ?Palliative Care Screening  Not Applicable   ?Medication Review Oceanographer)  Complete   ? ? ?

## 2021-07-08 NOTE — Progress Notes (Signed)
Report given to Technical brewer at [facility].  ?

## 2021-07-08 NOTE — TOC Transition Note (Signed)
Transition of Care (TOC) - CM/SW Discharge Note ? ? ?Patient Details  ?Name: Jon Bennett ?MRN: [id number] ?Date of Birth: 1934-06-29 ? ?Transition of Care (TOC) CM/SW Contact:  ?Mearl Latin, LCSW ?Phone Number: ?07/08/2021, 1:23 PM ? ? ?Clinical Narrative:    ?Patient will DC to: [facility] ?Anticipated DC date: 07/08/21 ?Family notified: [name], Son ?Transport by: Sharin Mons ? ? ?Per MD patient ready for DC to [facility] ([place] RN to call report prior to discharge [phone] room [phone]). RN, patient, patient's family, and facility notified of DC. Discharge Summary and FL2 sent to facility. DC packet on chart. Ambulance transport requested for patient.  ? ?CSW will sign off for now as social work intervention is no longer needed. Please consult [place] again if new needs arise. ? ? ? ? ?Final next level of care: Skilled Nursing Facility ?Barriers to Discharge: Barriers Resolved ? ? ?Patient Goals and CMS Choice ?Patient states their goals for this hospitalization and ongoing recovery are:: Rehab ?CMS Medicare.gov Compare Post Acute Care list provided to:: Patient Represenative (must comment) ?Choice offered to / list presented to : Adult Children ? ?Discharge Placement ?  ?Existing PASRR number confirmed : 07/08/21          ?Patient chooses bed at: [facility] ?Patient to be transferred to facility by: [facility] ?Name of family member notified: [name] ?Patient and family notified of of transfer: 07/08/21 ? ?Discharge Plan and Services ?In-house Referral: Clinical Social Work ?  ?           ?  ?  ?  ?  ?  ?  ?  ?  ?  ?  ? ?Social Determinants of Health (SDOH) Interventions ?  ? ? ?Readmission Risk Interventions ? ?  07/03/2021  ?  2:10 PM 06/26/2021  ?  3:09 PM 08/27/2020  ? 11:33 AM  ?Readmission Risk Prevention Plan  ?Transportation Screening Complete Complete Complete  ?PCP or Specialist Appt within 5-7 Days Complete  Complete  ?Home Care Screening Complete  Complete  ?Medication Review (RN CM) Complete  Complete  ?HRI or Home  Care Consult  Complete   ?Palliative Care Screening  Not Applicable   ?Medication Review Oceanographer)  Complete   ? ? ? ? ? ?

## 2021-07-08 NOTE — Progress Notes (Signed)
Called [facility] to give report. Nurse unavailable to take report at the time. Will call back.  ?

## 2021-07-08 NOTE — Progress Notes (Signed)
[facility] has arrived to transport pt to [place]. Discharge paperwork given to EMTs. Report has been called.  ?

## 2021-07-08 NOTE — Progress Notes (Signed)
Physical Therapy Treatment ?Patient Details ?Name: Jon Bennett ?MRN: [id number] ?DOB: Nov 23, 1934 ?Today's Date: 07/08/2021 ? ? ?History of Present Illness 87 y.o. male who presents to the emergency department due to several days of onset of nausea and vomiting and increased urinary frequency. +ascending cholangitis; transfer to [facility] from [facility] 4/19; +encephalopathy; +swollen/warm left knee; ortho consulted and did not feel aspiration needed   PMH significant of type 2 diabetes mellitus, hypertension, splenic vein thrombosis, alcohol withdrawal, GERD ? ?  ?PT Comments  ? ? Patient reports knee pain about the same, but during exercises and bed mobility able to actively flex knee much better. Session limited by diarrhea and unable to progress to standing.   ?Recommendations for follow up therapy are one component of a multi-disciplinary discharge planning process, led by the attending physician.  Recommendations may be updated based on patient status, additional functional criteria and insurance authorization. ? ?Follow Up Recommendations ? Skilled nursing-short term rehab (<3 hours/day) ?  ?  ?Assistance Recommended at Discharge Frequent or constant Supervision/Assistance  ?Patient can return home with the following Two people to help with walking and/or transfers;Two people to help with bathing/dressing/bathroom;Assistance with cooking/housework;Direct supervision/assist for medications management;Direct supervision/assist for financial management;Assist for transportation;Help with stairs or ramp for entrance ?  ?Equipment Recommendations ? Other (comment) (TBD)  ?  ?Recommendations for Other Services   ? ? ?  ?Precautions / Restrictions Precautions ?Precautions: Fall;Other (comment) ?Precaution Comments: painful, swollen left knee ?Restrictions ?Weight Bearing Restrictions: No  ?  ? ?Mobility ? Bed Mobility ?Overal bed mobility: Needs Assistance ?Bed Mobility: Rolling ?Rolling: Min guard ?  ?  ?  ?  ?General bed  mobility comments: rolling rt and lt x 2 each for pericare and linen change (due to incontinent of BM/diarrhea on arrival) ?  ? ?Transfers ?  ?  ?  ?  ?  ?  ?  ?  ?  ?General transfer comment: deferred due to ongoing diarrhea; RN made aware ?  ? ?Ambulation/Gait ?  ?  ?  ?  ?  ?  ?  ?  ? ? ?Stairs ?  ?  ?  ?  ?  ? ? ?Wheelchair Mobility ?  ? ?Modified Rankin (Stroke Patients Only) ?  ? ? ?  ?Balance   ?  ?  ?  ?  ?  ?  ?  ?  ?  ?  ?  ?  ?  ?  ?  ?  ?  ?  ?  ? ?  ?Cognition Arousal/Alertness: Awake/alert ?Behavior During Therapy: Jon Bennett for tasks assessed/performed ?Overall Cognitive Status: No family/caregiver present to determine baseline cognitive functioning ?  ?  ?  ?  ?  ?  ?  ?  ?  ?  ?  ?  ?  ?  ?  ?  ?General Comments: Pt oriented to place, self, month/year and sitation today. Demonstrates poor memory with repeating same questions multiple times ?  ?  ? ?  ?Exercises General Exercises - Lower Extremity ?Ankle Circles/Pumps: AROM, Both, 10 reps ?Heel Slides: AROM, Supine, Both, 10 reps (resisted extension on rt) ?Hip ABduction/ADduction: AROM, Right, AAROM, Left, 10 reps, Supine ? ?  ?General Comments   ?  ?  ? ?Pertinent Vitals/Pain Pain Assessment ?Pain Assessment: Faces ?Faces Pain Scale: Hurts whole lot ?Pain Location: left knee ?Pain Descriptors / Indicators: Discomfort, Grimacing, Guarding ?Pain Intervention(s): Limited activity within patient's tolerance, Monitored during session  ? ? ?Home Living   ?  ?  ?  ?  ?  ?  ?  ?  ?  ?   ?  ?  Prior Function    ?  ?  ?   ? ?PT Goals (current goals can now be found in the care plan section) Acute Rehab PT Goals ?Patient Stated Goal: wants to go to rehab to get stronger ?Time For Goal Achievement: 07/16/21 ?Potential to Achieve Goals: Fair ?Progress towards PT goals: Not progressing toward goals - comment (limited by diarrhea) ? ?  ?Frequency ? ? ? Min 3X/week (until SNF confirmed) ? ? ? ?  ?PT Plan Current plan remains appropriate  ? ? ?Co-evaluation   ?  ?  ?  ?   ? ?  ?AM-PAC PT "6 Clicks" Mobility   ?Outcome Measure ? Help needed turning from your back to your side while in a flat bed without using bedrails?: A Lot ?Help needed moving from lying on your back to sitting on the side of a flat bed without using bedrails?: A Lot ?Help needed moving to and from a bed to a chair (including a wheelchair)?: Total ?Help needed standing up from a chair using your arms (e.g., wheelchair or bedside chair)?: Total ?Help needed to walk in Bennett room?: Total ?Help needed climbing 3-5 steps with a railing? : Total ?6 Click Score: 8 ? ?  ?End of Session   ?Activity Tolerance: Treatment limited secondary to medical complications (Comment) (diarrhea) ?Patient left: in bed;with call bell/phone within reach;with bed alarm set ?Nurse Communication: Other (comment) (was incontinent of bowels and needs new primofit and sacral pad) ?PT Visit Diagnosis: Muscle weakness (generalized) (M62.81);Pain ?Pain - Right/Left: Left ?Pain - part of body: Knee ?  ? ? ?Time: 9233-0076 ?PT Time Calculation (min) (ACUTE ONLY): 30 min ? ?Charges:  $Therapeutic Exercise: 8-22 mins ?$Therapeutic Activity: 8-22 mins          ?          ? ? ?Jon Bennett, PT ?Acute Rehabilitation Services  ?Pager [phone] ?Office [phone] ? ? ? ?Jon Bennett ?07/08/2021, 10:25 AM ? ?

## 2021-07-09 ENCOUNTER — Encounter: Payer: Self-pay | Admitting: Adult Health

## 2021-07-09 ENCOUNTER — Non-Acute Institutional Stay (SKILLED_NURSING_FACILITY): Payer: Medicare Other | Admitting: Adult Health

## 2021-07-09 DIAGNOSIS — K922 Gastrointestinal hemorrhage, unspecified: Secondary | ICD-10-CM | POA: Diagnosis not present

## 2021-07-09 DIAGNOSIS — M25562 Pain in left knee: Secondary | ICD-10-CM | POA: Diagnosis not present

## 2021-07-09 DIAGNOSIS — R000 Tachycardia, unspecified: Secondary | ICD-10-CM | POA: Diagnosis not present

## 2021-07-09 DIAGNOSIS — D696 Thrombocytopenia, unspecified: Secondary | ICD-10-CM

## 2021-07-09 DIAGNOSIS — N179 Acute kidney failure, unspecified: Secondary | ICD-10-CM

## 2021-07-09 DIAGNOSIS — R6521 Severe sepsis with septic shock: Secondary | ICD-10-CM

## 2021-07-09 DIAGNOSIS — A4151 Sepsis due to Escherichia coli [E. coli]: Secondary | ICD-10-CM | POA: Diagnosis not present

## 2021-07-09 NOTE — Progress Notes (Signed)
? ?Location:  [facility] ?Nursing Home Room Number: 312-A ?Place of Service:  SNF (31) ?Provider:  Kenard Gower, DNP, FNP-BC ? ?Patient Care Team: ?[name], MD as PCP - General (Family Medicine) ? ?Extended Emergency Contact Information ?Primary Emergency Contact: Oldfield,Mark ?         [place], [place] [address] [place] ?Home Phone: [phone] ?Mobile Phone: [phone] ?Relation: Son ?Secondary Emergency Contact: Limehouse,Jim ?Home Phone: [phone] ?Mobile Phone: [phone] ?Relation: Son ?Interpreter needed? No ? ?Code Status:  DNR ? ?Goals of care: Advanced Directive information ? ?  07/09/2021  ? 11:46 AM  ?Advanced Directives  ?Does Patient Have a Medical Advance Directive? Yes  ?Type of Advance Directive Out of facility DNR (pink MOST or yellow form)  ?Does patient want to make changes to medical advance directive? No - Patient declined  ? ? ? ?Chief Complaint  ?Patient presents with  ? Hospitalization Follow-up  ? ? ?HPI:  ?Pt is a 87 y.o. male who was admitted to [facility] on 07/08/21 post hospital admission 06/23/2021 to 07/08/2021.  He has a PMH of type 2 diabetes mellitus, hypertension and splenic vein thrombosis.  He presented to the hospital with cellulitis.  E. coli bacteremia.  He was given broad-spectrum IV antimicrobial therapy and evaluated by GI.  He had a ERCP on 4/21, after discussion with patient/family, plans are for outpatient elective cholecystectomy. ? ?He was seen in the room today He was tachycardic with HRs in the 150s with BP 100/68. He was noted to be anxious and stated that he feels funny. He denies chest pains. He was given Metoprolol 25 mg PO X 1 and Midodrine 5 mg PO X 1. He was given 0.9 NS IV fluid @ 70 ml/hour X 1L. ? ? ?Past Surgical History:  ?Procedure Laterality Date  ? APPENDECTOMY    ? ERCP N/A 07/03/2021  ? Procedure: ENDOSCOPIC RETROGRADE CHOLANGIOPANCREATOGRAPHY (ERCP);  Surgeon: Lynann Bologna, MD;  Location:  [facility] ENDOSCOPY;  Service: Gastroenterology;  Laterality: N/A;  ? ESOPHAGOGASTRODUODENOSCOPY (EGD) WITH PROPOFOL N/A 06/25/2021  ? Procedure: ESOPHAGOGASTRODUODENOSCOPY (EGD) WITH PROPOFOL;  Surgeon: Malissa Hippo, MD;  [facility]O [facility];  Service: Endoscopy;  Laterality: N/A;  ? REMOVAL OF STONES  07/03/2021  ? Procedure: REMOVAL OF STONES;  Surgeon: Lynann Bologna, MD;  Location: [facility] ENDOSCOPY;  Service: Gastroenterology;;  ? SPHINCTEROTOMY  07/03/2021  ? Procedure: SPHINCTEROTOMY;  Surgeon: Lynann Bologna, MD;  Location: [facility] ENDOSCOPY;  Service: Gastroenterology;;  ? VASCULAR SURGERY    ? ? ?Allergies  ?Allergen Reactions  ? Asa [Aspirin] Shortness Of Breath  ? ? ?Outpatient Encounter Medications as of 07/09/2021  ?Medication Sig  ? acetaminophen (TYLENOL) 500 MG tablet Take 500 mg by mouth daily as needed for mild pain or moderate pain.  ? bisacodyl (DULCOLAX) 10 MG suppository If not relieved by MOM, give 10 mg Bisacodyl suppositiory rectally X 1 dose in 24 hours as needed  ? ELIQUIS 5 MG TABS tablet TAKE 1 TABLET BY MOUTH TWICE A DAY  ? feeding supplement (ENSURE ENLIVE / ENSURE PLUS) LIQD Take 237 mLs by mouth 3 (three) times daily between meals.  ? folic acid (FOLVITE) 1 MG tablet Take 1 tablet (1 mg total) by mouth daily.  ? lidocaine (LIDODERM) 5 % Place 1 patch onto the skin daily. Remove & Discard patch within 12 hours or as directed by MD  ? Magnesium Hydroxide (MILK OF MAGNESIA PO) If no BM in 3 days, give 30 cc Milk of  Magnesium p.o. x 1 dose in 24 hours as needed  ? Multiple Vitamin (MULTIVITAMIN WITH MINERALS) TABS tablet Take 1 tablet by mouth daily.  ? pantoprazole (PROTONIX) 40 MG tablet Take 1 tablet (40 mg total) by mouth 2 (two) times daily.  ? Sodium Phosphates (RA SALINE ENEMA RE) If not relieved by Biscodyl suppository, give disposable Saline Enema rectally X 1 dose/24 hrs as needed  ? tamsulosin (FLOMAX) 0.4 MG CAPS capsule Take 0.4 mg by mouth daily.  ? thiamine 100 MG tablet Take 1 tablet  (100 mg total) by mouth daily.  ? ?No facility-administered encounter medications on file as of 07/09/2021.  ? ? ?Review of Systems  ?Constitutional:  Negative for activity change, appetite change and fever.  ?HENT:  Negative for sore throat.   ?Eyes: Negative.   ?Cardiovascular:  Positive for palpitations. Negative for chest pain and leg swelling.  ?Gastrointestinal:  Negative for abdominal distention, diarrhea and vomiting.  ?Genitourinary:  Negative for dysuria, frequency and urgency.  ?Skin:  Negative for color change.  ?Neurological:  Negative for dizziness and headaches.  ?Psychiatric/Behavioral:  Negative for behavioral problems and sleep disturbance. The patient is not nervous/anxious.    ? ? ? ?Immunization History  ?Administered Date(s) Administered  ? Influenza-Unspecified 12/16/2009  ? Moderna Sars-Covid-2 Vaccination 05/17/2019, 06/20/2019  ? Pneumococcal Polysaccharide-23 12/16/2009  ? Tdap 03/05/2011, 08/10/2018  ? ?Pertinent  Health Maintenance Due  ?Topic Date Due  ? FOOT EXAM  Never done  ? OPHTHALMOLOGY EXAM  Never done  ? URINE MICROALBUMIN  Never done  ? HEMOGLOBIN A1C  12/30/2019  ? INFLUENZA VACCINE  10/13/2021  ? ? ?  07/06/2021  ?  8:00 AM 07/06/2021  ?  8:00 PM 07/07/2021  ?  9:15 AM 07/07/2021  ?  8:00 PM 07/08/2021  ?  8:45 AM  ?Fall Risk  ?Patient Fall Risk Level High fall risk High fall risk High fall risk High fall risk High fall risk  ? ? ? ?Vitals:  ? 07/09/21 1142  ?BP: 137/85  ?Pulse: 91  ?Resp: 18  ?Temp: 97.7 ?F (36.5 ?C)  ? ?There is no height or weight on file to calculate BMI. ? ?Physical Exam ?Constitutional:   ?   Appearance: Normal appearance.  ?HENT:  ?   Head: Normocephalic and atraumatic.  ?   Mouth/Throat:  ?   Mouth: Mucous membranes are moist.  ?Eyes:  ?   Conjunctiva/sclera: Conjunctivae normal.  ?Cardiovascular:  ?   Rate and Rhythm: Regular rhythm. Tachycardia present.  ?   Pulses: Normal pulses.  ?   Heart sounds: Normal heart sounds.  ?Pulmonary:  ?   Effort: Pulmonary  effort is normal.  ?   Breath sounds: Normal breath sounds.  ?Abdominal:  ?   General: Bowel sounds are normal.  ?   Palpations: Abdomen is soft.  ?Musculoskeletal:     ?   General: No swelling. Normal range of motion.  ?   Cervical back: Normal range of motion.  ?Skin: ?   General: Skin is warm and dry.  ?Neurological:  ?   General: No focal deficit present.  ?   Mental Status: He is alert and oriented to person, place, and time.  ?Psychiatric:     ?   Mood and Affect: Mood normal.     ?   Behavior: Behavior normal.     ?   Thought Content: Thought content normal.     ?   Judgment: Judgment normal.  ?  ? ? ? ?  Labs reviewed: ?Recent Labs  ?  07/06/21 ?0345 07/07/21 ?[id number] 07/08/21 ?0410  ?NA 138 135 137  ?K 4.3 4.0 4.0  ?CL 106 105 106  ?CO2 26 24 25   ?GLUCOSE 119* 111* 110*  ?BUN 11 5* 5*  ?CREATININE 0.78 0.82 1.02  ?CALCIUM 8.5* 8.7* 8.9  ?MG 1.9 1.9 2.0  ?PHOS 3.2 2.6 2.6  ? ?Recent Labs  ?  07/06/21 ?0345 07/07/21 ?[id number] 07/08/21 ?0410  ?AST 18 18 16   ?ALT 13 12 12   ?ALKPHOS 85 93 89  ?BILITOT 1.2 1.4* 1.4*  ?PROT 5.3* 5.8* 5.9*  ?ALBUMIN 2.7* 2.7* 2.6*  ? ?Recent Labs  ?  06/28/21 ?0405 06/29/21 ?0331 06/30/21 ?0610 07/02/21 ?[id number] 07/06/21 ?0345 07/07/21 ?[id number] 07/08/21 ?0410  ?WBC 11.6* 7.5 5.2   < > 6.2 6.6 6.5  ?NEUTROABS 9.8* 6.4 4.1  --   --   --   --   ?HGB 10.5* 9.6* 9.3*   < > 9.6* 10.5* 10.7*  ?HCT 31.8* 27.7* 26.0*   < > 29.5* 31.1* 31.8*  ?MCV 91.6 91.1 89.3   < > 93.7 93.1 93.3  ?PLT 52* 62* 77*   < > 195 195 165  ? < > = values in this interval not displayed.  ? ?Lab Results  ?Component Value Date  ? TSH 0.542 08/24/2020  ? ?Lab Results  ?Component Value Date  ? HGBA1C 6.1 (H) 06/30/2019  ? ?No results found for: CHOL, HDL, LDLCALC, LDLDIRECT, TRIG, CHOLHDL ? ?Significant Diagnostic Results in last 30 days:  ?CT ABDOMEN PELVIS W CONTRAST ? ?Result Date: 06/24/2021 ?CLINICAL DATA:  Abdomen pain nausea vomiting EXAM: CT ABDOMEN AND PELVIS WITH CONTRAST TECHNIQUE: Multidetector CT imaging of the abdomen  and pelvis was performed using the standard protocol following bolus administration of intravenous contrast. RADIATION DOSE REDUCTION: This exam was performed according to the departmental dose-optimi

## 2021-07-10 ENCOUNTER — Encounter: Payer: Self-pay | Admitting: Internal Medicine

## 2021-07-10 ENCOUNTER — Non-Acute Institutional Stay (SKILLED_NURSING_FACILITY): Payer: Medicare Other | Admitting: Internal Medicine

## 2021-07-10 DIAGNOSIS — A4151 Sepsis due to Escherichia coli [E. coli]: Secondary | ICD-10-CM | POA: Diagnosis not present

## 2021-07-10 DIAGNOSIS — N179 Acute kidney failure, unspecified: Secondary | ICD-10-CM | POA: Diagnosis not present

## 2021-07-10 DIAGNOSIS — R7401 Elevation of levels of liver transaminase levels: Secondary | ICD-10-CM | POA: Diagnosis not present

## 2021-07-10 DIAGNOSIS — R6521 Severe sepsis with septic shock: Secondary | ICD-10-CM

## 2021-07-10 DIAGNOSIS — K8309 Other cholangitis: Secondary | ICD-10-CM | POA: Diagnosis not present

## 2021-07-10 DIAGNOSIS — L89151 Pressure ulcer of sacral region, stage 1: Secondary | ICD-10-CM

## 2021-07-10 NOTE — Assessment & Plan Note (Signed)
He denies any active GI symptoms or abdominal pain. ?

## 2021-07-10 NOTE — Patient Instructions (Signed)
See assessment and plan under each diagnosis in the problem list and acutely for this visit 

## 2021-07-10 NOTE — Assessment & Plan Note (Signed)
Afebrile and essentially asymptomatic. ?

## 2021-07-10 NOTE — Assessment & Plan Note (Signed)
Wound care nurse at SNF to follow. ?

## 2021-07-10 NOTE — Assessment & Plan Note (Signed)
No scleral icterus or jaundice present.  Abdomen nontender. ?

## 2021-07-10 NOTE — Progress Notes (Signed)
? ?NURSING HOME LOCATION:  [place] ?ROOM NUMBER:  312 ? ?CODE STATUS: DNR ? ?PCP: Mertha Baars, MD ? ?This is a comprehensive admission note to this SNFperformed on this date less than 30 days from date of admission. ?Included are preadmission medical/surgical history; reconciled medication list; family history; social history and comprehensive review of systems.  ?Corrections and additions to the records were documented. Comprehensive physical exam was also performed. Additionally a clinical summary was entered for each active diagnosis pertinent to this admission in the Problem List to enhance continuity of care. ? ?HPI: He was hospitalized 4/11 - 07/08/2021 admitted from home with sepsis due to cholangitis and E. coli bacteremia.  This was associated with intractable nausea and vomiting.  Additionally upper GI bleeding was clinically present.  Broad-spectrum IV antibiotic therapy was initiated. CT revealed multiple stones and enlarged CBD without inflammation. ?On 4/13 EGD was performed by Dr. Laural Golden revealing small Mallory-Weiss tear.  PPI was continued. ?On 4/21 ERCP, sphincterotomy, and stone removal were performed by Dr. Lyndel Safe. ?Course was complicated by AKI.  Creatinine was 1.48 and GFR 46 indicating low stage IIIa CKD.  This did resolve and at discharge creatinine was 1.02 and GFR greater than 60.   ?Transaminitis was present with AST of 158, ALT 55, and total bilirubin of 4.  These resolved and at discharge AST was 16 and ALT 12.   ?Also thrombocytopenia was present which was clinically felt to be related to prior alcohol use.  Nadir platelet count was 52,000; at discharge it was 165,000.   ?H/H at admission was 13.2/37.9; nadir H/H was 7.1/21.6.  At discharge H/H 10.7/31.8. ?He has a history of splenic vein thrombosis; new left portal vein thrombosis was present and Eliquis was initiated. ?Stage I right sacral ulcer with intact skin with non blanchable redness was present at  admission.  Wound care consulted & recommended foam dressing. ?IV Zosyn was to be completed as of 4/26. ?Orthopedics consulted for left knee effusion; no clinical indication for arthrocentesis was felt to be present. ?Post rehab; outpatient elective cholecystectomy is to be pursued. ? ?Past medical and surgical history: Includes GERD, essential hypertension, history of alcohol-related pancreatitis, history of splenic vein thrombosis, diabetes with vascular disease. ?Surgeries and procedures include prior history of vascular surgery. ? ?Social history: Not currently drinking alcohol.  No history of smoking.  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  1 son apparently is a Stage manager in the [place] area. ? ?Family history: Reviewed; noncontributory due to advanced age. ?  ?Review of systems: He gave the date as Friday, July 09, 2021.  He had some difficulty recalling the word radiologist, stating that his son "did x-rays". ?Despite a BIMS score of 12 out of 15 he actually has good comprehension of why he was in the hospital.  He states that he had "stones in my duct, 6 or 7 taken out".  He states he did not have a cholecystectomy because "my blood count went haywire".  He did not realize it was the low platelet count which deferred the cholecystectomy. ?He denies any active GI symptoms.  Speech therapy says that he is eating well. ?He states he has pain in the upper neck/lower occiput with neck extension which he relates as sequellae to years riding horses.  He has no associated neuromuscular deficits with this.  He also describes intermittent pain in the left knee. ? ?Constitutional: No fever, significant weight change ?Eyes: No  redness, discharge, pain, vision change ?ENT/mouth: No nasal congestion, purulent discharge, earache, change in hearing, sore throat  ?Cardiovascular: No chest pain, palpitations, paroxysmal nocturnal dyspnea, claudication, edema  ?Respiratory: No cough,  sputum production, hemoptysis, DOE, significant snoring, apnea  ?Gastrointestinal: No heartburn, dysphagia, abdominal pain, nausea /vomiting, rectal bleeding, melena, change in bowels ?Genitourinary: No dysuria, hematuria, pyuria, incontinence, nocturia ?Dermatologic: No rash, pruritus, change in appearance of skin ?Neurologic: No dizziness, headache, syncope, seizures, numbness, tingling ?Psychiatric: No significant anxiety, depression, insomnia, anorexia ?Endocrine: No change in hair/skin/nails, excessive thirst, excessive hunger, excessive urination  ?Hematologic/lymphatic: No significant bruising, lymphadenopathy, abnormal bleeding ?Allergy/immunology: No itchy/watery eyes, significant sneezing, urticaria, angioedema ? ?Physical exam:  ?Pertinent or positive findings: Appears somewhat chronically ill and suboptimally nourished.  Pattern alopecia is present.  Hair is disheveled.  Eyes are somewhat sunken.  He is Geneticist, molecular.  He is wearing only the upper plate.  He exhibits a slight tachycardia at rest with increased S1 and S2.  Breath sounds are decreased.  Bowel sounds are also decreased.  Pedal pulses are nonpalpable.  Ankles are dressed.  The limbs are thin and he has interosseous wasting.  There is scarring at the left elbow with some vitiliginous change.  He also has bruising over the dorsum of the left hand. ? ?General appearance: no acute distress, increased work of breathing is present.   ?Lymphatic: No lymphadenopathy about the head, neck, axilla. ?Eyes: No conjunctival inflammation or lid edema is present. There is no scleral icterus. ?Ears:  External ear exam shows no significant lesions or deformities.   ?Nose:  External nasal examination shows no deformity or inflammation. Nasal mucosa are pink and moist without lesions, exudates ?Neck:  No thyromegaly, masses, tenderness noted.    ?Heart:  No murmur, click, rub.  ?Lungs: without wheezes, rhonchi, rales, rubs. ?Abdomen: Abdomen is soft and nontender  with no organomegaly, hernias, masses. ?GU: Deferred  ?Extremities:  No cyanosis, clubbing, edema. ?Neurologic exam: Balance, Rhomberg, finger to nose testing could not be completed due to clinical state ?Skin: Warm & dry w/o tenting. ?No significant  rash. ? ?See clinical summary under each active problem in the Problem List with associated updated therapeutic plan ? ?

## 2021-07-10 NOTE — Assessment & Plan Note (Signed)
Medication list reviewed; no indication for change in meds or dosages. ?

## 2021-07-15 LAB — BASIC METABOLIC PANEL
BUN: 12 (ref 4–21)
CO2: 24 — AB (ref 13–22)
Chloride: 97 — AB (ref 99–108)
Creatinine: 0.9 (ref 0.6–1.3)
Glucose: 119
Potassium: 4.3 mEq/L (ref 3.5–5.1)
Sodium: 136 — AB (ref 137–147)

## 2021-07-15 LAB — COMPREHENSIVE METABOLIC PANEL
Calcium: 10.1 (ref 8.7–10.7)
eGFR: 85

## 2021-07-15 LAB — TSH: TSH: 1.03 (ref 0.41–5.90)

## 2021-07-15 LAB — CBC AND DIFFERENTIAL
HCT: 41 (ref 41–53)
Hemoglobin: 13.6 (ref 13.5–17.5)
Platelets: 298 10*3/uL (ref 150–400)
WBC: 8.7

## 2021-07-15 LAB — CBC: RBC: 4.37 (ref 3.87–5.11)

## 2021-07-19 ENCOUNTER — Encounter: Payer: Self-pay | Admitting: Adult Health

## 2021-07-20 ENCOUNTER — Non-Acute Institutional Stay (SKILLED_NURSING_FACILITY): Payer: Medicare Other | Admitting: Adult Health

## 2021-07-20 ENCOUNTER — Encounter: Payer: Self-pay | Admitting: Adult Health

## 2021-07-20 DIAGNOSIS — K8309 Other cholangitis: Secondary | ICD-10-CM

## 2021-07-20 DIAGNOSIS — I82890 Acute embolism and thrombosis of other specified veins: Secondary | ICD-10-CM | POA: Diagnosis not present

## 2021-07-20 DIAGNOSIS — I81 Portal vein thrombosis: Secondary | ICD-10-CM

## 2021-07-20 DIAGNOSIS — M25562 Pain in left knee: Secondary | ICD-10-CM

## 2021-07-20 DIAGNOSIS — K922 Gastrointestinal hemorrhage, unspecified: Secondary | ICD-10-CM

## 2021-07-20 DIAGNOSIS — R000 Tachycardia, unspecified: Secondary | ICD-10-CM

## 2021-07-20 NOTE — Progress Notes (Signed)
? ?Location:  [facility] ?Nursing Home Room Number: [id number] ?Place of Service:  SNF (31) ?Provider:  Durenda Age, DNP, FNP-BC ? ?Patient Care Team: ?Mertha Baars, MD as PCP - General (Family Medicine) ? ?Extended Emergency Contact Information ?Primary Emergency Contact: Ocasio,Mark ?         [place], [place] [address] [place] ?Home Phone: [phone] ?Mobile Phone: [phone] ?Relation: Son ?Secondary Emergency Contact: Weingarten,Jim ?Home Phone: [phone] ?Mobile Phone: [phone] ?Relation: Son ?Interpreter needed? No ? ?Code Status:  DNR ? ?Goals of care: Advanced Directive information ? ?  07/20/2021  ?  2:32 PM  ?Advanced Directives  ?Does Patient Have a Medical Advance Directive? Yes  ?Type of Advance Directive Out of facility DNR (pink MOST or yellow form)  ?Does patient want to make changes to medical advance directive? No - Patient declined  ?Pre-existing out of facility DNR order (yellow form or pink MOST form) Yellow form placed in chart (order not valid for inpatient use)  ? ? ? ?Chief Complaint  ?Patient presents with  ? Acute Visit  ?  Short term rehabilitation   ? ? ?HPI:  ?Pt is a 87 y.o. male seen today for short-term rehabilitation visit. He is currently having PT, OT and ST. He was recently started on Meloxicam 7.5 mg daily and Diclofenac 4% gel topically to left knee BID. He stated that his left knee pain improved,now 2/10. He stated that he moves his bowels at least once or twice a day. He completed IV antibiotics for E. coli bacteremia and ascending cholangitis. Cholecystectomy was planned to be done when patient's debility improves. No reported coughing out of blood. He takes Pantoprazole 40 mg BID for upper GI bleed. He takes Eliquis 5 mg BID for  history of splenic vein thrombosis and new left portal vein thrombosis. ? ? ?Past Medical History:  ?Diagnosis Date  ? Diabetes mellitus without complication ([place])   ? Patient is not on any medication for this  ? GERD  (gastroesophageal reflux disease)   ? Hypertension   ? Pancreatitis, alcoholic, acute   ? Splenic vein thrombosis   ? ?Past Surgical History:  ?Procedure Laterality Date  ? APPENDECTOMY    ? ERCP N/A 07/03/2021  ? Procedure: ENDOSCOPIC RETROGRADE CHOLANGIOPANCREATOGRAPHY (ERCP);  Surgeon: Jackquline Denmark, MD;  [facility] ENDOSCOPY;  Service: Gastroenterology;  Laterality: N/A;  ? ESOPHAGOGASTRODUODENOSCOPY (EGD) WITH PROPOFOL N/A 06/25/2021  ? Procedure: ESOPHAGOGASTRODUODENOSCOPY (EGD) WITH PROPOFOL;  Surgeon: Rogene Houston, MD;  [facility];  Service: Endoscopy;  Laterality: N/A;  ? REMOVAL OF STONES  07/03/2021  ? Procedure: REMOVAL OF STONES;  Surgeon: Jackquline Denmark, MD;  Location: [facility];  Service: Gastroenterology;;  ? SPHINCTEROTOMY  07/03/2021  ? Procedure: SPHINCTEROTOMY;  Surgeon: Jackquline Denmark, MD;  Location: [facility] ENDOSCOPY;  Service: Gastroenterology;;  ? VASCULAR SURGERY    ? ? ?Allergies  ?Allergen Reactions  ? Asa [Aspirin] Shortness Of Breath  ? ? ?Outpatient Encounter Medications as of 07/20/2021  ?Medication Sig  ? acetaminophen (TYLENOL) 500 MG tablet Take 500 mg by mouth daily as needed for mild pain or moderate pain.  ? bisacodyl (DULCOLAX) 10 MG suppository If not relieved by MOM, give 10 mg Bisacodyl suppositiory rectally X 1 dose in 24 hours as needed  ? diclofenac Sodium (VOLTAREN) 1 % GEL APPLY 4 GRAMS TOPICALLY TO LEFT KNEE TWICE A DAY FOR ARTHRITIS  ? ELIQUIS 5 MG TABS tablet TAKE 1 TABLET BY MOUTH TWICE A DAY  ? feeding supplement (ENSURE  ENLIVE / ENSURE PLUS) LIQD Take 237 mLs by mouth 3 (three) times daily between meals.  ? folic acid (FOLVITE) 1 MG tablet Take 1 tablet (1 mg total) by mouth daily.  ? Magnesium Hydroxide (MILK OF MAGNESIA PO) If no BM in 3 days, give 30 cc Milk of Magnesium p.o. x 1 dose in 24 hours as needed  ? meloxicam (MOBIC) 7.5 MG tablet Take 7.5 mg by mouth daily.  ? metoprolol tartrate (LOPRESSOR) 25 MG tablet Take 25 mg by mouth 2 (two) times  daily.  ? midodrine (PROAMATINE) 5 MG tablet Take 5 mg by mouth daily. AS NEEDED FOR SBP</=100  ? Multiple Vitamin (MULTIVITAMIN WITH MINERALS) TABS tablet Take 1 tablet by mouth daily.  ? NON FORMULARY Diet:regular  ? pantoprazole (PROTONIX) 40 MG tablet Take 1 tablet (40 mg total) by mouth 2 (two) times daily.  ? Sodium Phosphates (RA SALINE ENEMA RE) If not relieved by Biscodyl suppository, give disposable Saline Enema rectally X 1 dose/24 hrs as needed  ? tamsulosin (FLOMAX) 0.4 MG CAPS capsule Take 0.4 mg by mouth daily.  ? thiamine 100 MG tablet Take 1 tablet (100 mg total) by mouth daily.  ? [DISCONTINUED] lidocaine (LIDODERM) 5 % Place 1 patch onto the skin daily. Remove & Discard patch within 12 hours or as directed by MD  ? ?No facility-administered encounter medications on file as of 07/20/2021.  ? ? ?Review of Systems  ?Constitutional:  Negative for activity change, appetite change and fever.  ?HENT:  Negative for sore throat.   ?Eyes: Negative.   ?Cardiovascular:  Negative for chest pain and leg swelling.  ?Gastrointestinal:  Negative for abdominal distention, diarrhea and vomiting.  ?Genitourinary:  Negative for dysuria, frequency and urgency.  ?Skin:  Negative for color change.  ?Neurological:  Negative for dizziness and headaches.  ?Psychiatric/Behavioral:  Negative for behavioral problems and sleep disturbance. The patient is not nervous/anxious.    ? ? ? ?Immunization History  ?Administered Date(s) Administered  ? Influenza-Unspecified 12/16/2009  ? Moderna Sars-Covid-2 Vaccination 05/17/2019, 06/20/2019  ? Pneumococcal Polysaccharide-23 12/16/2009  ? Tdap 03/05/2011, 08/10/2018  ? ?Pertinent  Health Maintenance Due  ?Topic Date Due  ? FOOT EXAM  Never done  ? OPHTHALMOLOGY EXAM  Never done  ? URINE MICROALBUMIN  Never done  ? HEMOGLOBIN A1C  12/30/2019  ? INFLUENZA VACCINE  10/13/2021  ? ? ?  07/06/2021  ?  8:00 AM 07/06/2021  ?  8:00 PM 07/07/2021  ?  9:15 AM 07/07/2021  ?  8:00 PM 07/08/2021  ?  8:45  AM  ?Fall Risk  ?Patient Fall Risk Level High fall risk High fall risk High fall risk High fall risk High fall risk  ? ? ? ?Vitals:  ? 07/20/21 1420  ?BP: 117/68  ?Pulse: 77  ?Resp: 18  ?Temp: (!) 97.5 ?F (36.4 ?C)  ?SpO2: 96%  ?Weight: 143 lb 6.4 oz (65 kg)  ?Height: 5' 7.5" (1.715 m)  ? ?Body mass index is 22.13 kg/m?. ? ?Physical Exam ?Constitutional:   ?   Appearance: Normal appearance.  ?HENT:  ?   Head: Normocephalic and atraumatic.  ?   Mouth/Throat:  ?   Mouth: Mucous membranes are moist.  ?Eyes:  ?   Conjunctiva/sclera: Conjunctivae normal.  ?Cardiovascular:  ?   Rate and Rhythm: Normal rate and regular rhythm.  ?   Pulses: Normal pulses.  ?   Heart sounds: Normal heart sounds.  ?Pulmonary:  ?   Effort: Pulmonary effort is normal.  ?  Breath sounds: Normal breath sounds.  ?Abdominal:  ?   General: Bowel sounds are normal.  ?   Palpations: Abdomen is soft.  ?Musculoskeletal:     ?   General: No swelling. Normal range of motion.  ?   Cervical back: Normal range of motion.  ?Skin: ?   General: Skin is warm and dry.  ?Neurological:  ?   General: No focal deficit present.  ?   Mental Status: He is alert and oriented to person, place, and time.  ?Psychiatric:     ?   Mood and Affect: Mood normal.     ?   Behavior: Behavior normal.     ?   Thought Content: Thought content normal.     ?   Judgment: Judgment normal.  ?  ? ?Labs reviewed: ?Recent Labs  ?  07/06/21 ?0345 07/07/21 ?0346 07/08/21 ?0410 07/15/21 ?0000  ?NA 138 135 137 136*  ?K 4.3 4.0 4.0 4.3  ?CL 106 105 106 97*  ?CO2 26 24 25  24*  ?GLUCOSE 119* 111* 110*  --   ?BUN 11 5* 5* 12  ?CREATININE 0.78 0.82 1.02 0.9  ?CALCIUM 8.5* 8.7* 8.9 10.1  ?MG 1.9 1.9 2.0  --   ?PHOS 3.2 2.6 2.6  --   ? ?Recent Labs  ?  07/06/21 ?0345 07/07/21 ?[id number] 07/08/21 ?0410  ?AST 18 18 16   ?ALT 13 12 12   ?ALKPHOS 85 93 89  ?BILITOT 1.2 1.4* 1.4*  ?PROT 5.3* 5.8* 5.9*  ?ALBUMIN 2.7* 2.7* 2.6*  ? ?Recent Labs  ?  06/28/21 ?0405 06/29/21 ?0331 06/30/21 ?0610 07/02/21 ?[id number]  07/06/21 ?0345 07/07/21 ?0346 07/08/21 ?0410 07/15/21 ?0000  ?WBC 11.6* 7.5 5.2   < > 6.2 6.6 6.5 8.7  ?NEUTROABS 9.8* 6.4 4.1  --   --   --   --   --   ?HGB 10.5* 9.6* 9.3*   < > 9.6* 10.5* 10.7* 13.6  ?HCT 31.8* 2

## 2021-07-20 NOTE — Telephone Encounter (Signed)
Forwarded to Dr. Alwyn Ren and Avanell Shackleton, [facility] patient.  ?

## 2021-07-21 ENCOUNTER — Encounter: Payer: Self-pay | Admitting: Internal Medicine

## 2021-07-21 DIAGNOSIS — M25562 Pain in left knee: Secondary | ICD-10-CM | POA: Diagnosis not present

## 2021-07-23 ENCOUNTER — Encounter: Payer: Self-pay | Admitting: Internal Medicine

## 2021-07-23 ENCOUNTER — Non-Acute Institutional Stay (SKILLED_NURSING_FACILITY): Payer: Medicare Other | Admitting: Internal Medicine

## 2021-07-23 DIAGNOSIS — E440 Moderate protein-calorie malnutrition: Secondary | ICD-10-CM

## 2021-07-23 DIAGNOSIS — E46 Unspecified protein-calorie malnutrition: Secondary | ICD-10-CM | POA: Insufficient documentation

## 2021-07-23 DIAGNOSIS — M25562 Pain in left knee: Secondary | ICD-10-CM | POA: Diagnosis not present

## 2021-07-23 DIAGNOSIS — K8309 Other cholangitis: Secondary | ICD-10-CM | POA: Diagnosis not present

## 2021-07-23 NOTE — Patient Instructions (Signed)
See assessment and plan under each diagnosis in the problem list and acutely for this visit 

## 2021-07-23 NOTE — Assessment & Plan Note (Signed)
Current total protein 5.9 and albumin 2.6.  Limb atrophy present.  Because of debility cholecystectomy has been deferred.  PT/OT reports significant progress in mobility, balance, and strength. ? ?

## 2021-07-23 NOTE — Assessment & Plan Note (Signed)
Patient confirms knee pain is not significant and is responding to topical anti-inflammatory agents.  He describes more discomfort due to cervical DDD. ?

## 2021-07-23 NOTE — Progress Notes (Addendum)
? ?NURSING HOME LOCATION:  [place] ?ROOM NUMBER:  [id number] ? ?CODE STATUS:  DNR ? ?PCP:  Mertha Baars MD ? ?This is a nursing facility follow up visit for specific acute issue of possible abdominal pain. ? ?Interim medical record and care since last SNF visit was updated with review of diagnostic studies and change in clinical status since last visit were documented. ? ?HPI: His son Jon Bennett called, concerned that the patient was having ongoing abdominal pain associated with diarrhea.  He feared that his father may have recurrent sepsis if cholecystectomy is not expedited.  This was deferred because of clinical debility for which she was discharged here.  Dr. Arnoldo Morale is to perform elective surgery in the future. ?Additionally there was concerned about lack of diagnosis for persistent knee pain associated with an effusion.  The latter was actually clarified 5/9 when he saw Dr. 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 ? ?Review of systems: At this time he states that he continues to improve.  He states that he only has some abdominal discomfort for a few seconds-minute manifested as a sharp, burning sensation before  a bowel movement in the left mid to lateral abdomen.  He does describe some dyspepsia.  He also has "a little diarrhea" which is actually loose stool for which a probiotic has been prescribed. ?His knee pain has responded to topical anti-inflammatory gel.  He continues to have chronic cervical pain. ?Physical therapy states that he is doing extremely well even climbing the stairs in the department. ? ?Constitutional: No fever, significant weight change ?Cardiovascular: No chest pain, palpitations, paroxysmal nocturnal dyspnea, claudication, edema  ?Respiratory: No cough, sputum production, hemoptysis, DOE ?Gastrointestinal: No dysphagia,  nausea /vomiting, rectal bleeding, melena ?Genitourinary: No dysuria, hematuria, pyuria, incontinence,  nocturia ?Dermatologic: No rash, pruritus, change in appearance of skin ?Neurologic: No dizziness, headache, numbness, tingling ?Psychiatric: No significant anxiety, depression, insomnia, anorexia ?Endocrine: No change in hair/skin/nails, excessive thirst, excessive hunger, excessive urination  ?Hematologic/lymphatic: No significant bruising, lymphadenopathy, abnormal bleeding ? ?Physical exam:  ?Pertinent or positive findings: He appears somewhat chronically, but not acutely ill.  Pattern alopecia is noted.  Orbits are somewhat sunken.  He is not wearing his dentures today.  There is slight increase in the first and second heart sounds.  He has insignificant minor low-grade rhonchi diffusely.  There is no tenderness to deep palpation of the abdomen.  Pedal pulses are decreased.  Limbs are atrophic and he has interosseous wasting. ? ?General appearance:  no acute distress, increased work of breathing is present.   ?Lymphatic: No lymphadenopathy about the head, neck, axilla. ?Eyes: No conjunctival inflammation or lid edema is present. There is no scleral icterus. ?Ears:  External ear exam shows no significant lesions or deformities.   ?Nose:  External nasal examination shows no deformity or inflammation. Nasal mucosa are pink and moist without lesions, exudates ?Oral exam:  There is no oropharyngeal erythema or exudate. ?Neck:  No thyromegaly, masses, tenderness noted.    ?Heart:  Normal rate and regular rhythm without gallop, murmur, click, rub .  ?Lungs:  without wheezes, rales, rubs. ?Abdomen: Bowel sounds are normal;no organomegaly, hernias, masses. ?GU: Deferred  ?Extremities:  No cyanosis, clubbing, edema  ?Skin: Warm & dry w/o tenting. ?No significant rash. ? ?See summary under each active problem in the Problem List with associated updated therapeutic plan ? ?07/25/2021 apparently insurance appeal for additional rehab denied & son ,Jon Bennett , will take him home. I texted  him list of his father's meds requesting  notice of any needed new Rxs or refills @ [facility]. ?He is to see Dr Arnoldo Morale ASAP to guide scheduling elective cholecystectomy if stable clinically. ?CMET pending. Copy of this report to Dr Arnoldo Morale & Dr Lyndel Safe, GI. ?

## 2021-07-23 NOTE — Assessment & Plan Note (Addendum)
Jon Bennett, his son, believes the patient is having significant ongoing abdominal pain and diarrhea.  Patient describes minimal loose stool for which probiotics have been prescribed.  The only discomfort he describes lasts seconds-minute occurring prior to [facility] and is in the left lateral abdominal area.  Abdomen is nontender to deep palpation. Labs (CBC, lipase & CMET) will be rechecked and a copy of this sent to Dr Lovell Sheehan & Dr Chales Abrahams, GI. ?He is to follow up with Dr Lovell Sheehan ASAP post discharge from SNF. ?

## 2021-07-23 NOTE — Telephone Encounter (Signed)
Hi All, ?Reviewed message from Dr. Mikey College. ?Hard to tell just by history  ?I do see recent CBC, BMP but not LFTs ? ?Plan: ?-Lets get CMP ?-If any abdominal pain, CT Abdo (pancreatic protocol) would be a good start. ?-If CT is abnormal, would consider repeat MRCP ? ?RG ?

## 2021-07-24 ENCOUNTER — Encounter: Payer: Self-pay | Admitting: Internal Medicine

## 2021-07-24 DIAGNOSIS — D649 Anemia, unspecified: Secondary | ICD-10-CM | POA: Insufficient documentation

## 2021-07-30 ENCOUNTER — Encounter: Payer: Self-pay | Admitting: General Surgery

## 2021-07-30 ENCOUNTER — Ambulatory Visit: Payer: Medicare Other | Admitting: General Surgery

## 2021-07-30 VITALS — BP 107/70 | HR 92 | Temp 97.8°F | Resp 14 | Ht 67.5 in | Wt 141.0 lb

## 2021-07-30 DIAGNOSIS — K8020 Calculus of gallbladder without cholecystitis without obstruction: Secondary | ICD-10-CM | POA: Diagnosis not present

## 2021-07-30 NOTE — Patient Instructions (Signed)
Stop Eliquis on Tuesday 5/23.

## 2021-07-30 NOTE — H&P (Signed)
Jon Bennett; [id number]; 1934-10-30   HPI Patient is an 87 year old white male status post ERCP for choledocholithiasis and cholangitis now presents for elective laparoscopic cholecystectomy.  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  He has been at a nursing home but was recently discharged.  He continues to have intermittent nausea and epigastric pain.  He was noted to have gastritis during his initial hospital stay.  He tolerated both procedures well.  He denies any fever, chills, or jaundice.  He has mild dementia. Past Medical History:  Diagnosis Date   Diabetes mellitus without complication (Gray)    Patient is not on any medication for this   GERD (gastroesophageal reflux disease)    Hypertension    Pancreatitis, alcoholic, acute    Splenic vein thrombosis     Past Surgical History:  Procedure Laterality Date   APPENDECTOMY     ERCP N/A 07/03/2021   Procedure: ENDOSCOPIC RETROGRADE CHOLANGIOPANCREATOGRAPHY (ERCP);  Surgeon: Jackquline Denmark, MD;  Location: [facility] ENDOSCOPY;  Service: Gastroenterology;  Laterality: N/A;   ESOPHAGOGASTRODUODENOSCOPY (EGD) WITH PROPOFOL N/A 06/25/2021   Procedure: ESOPHAGOGASTRODUODENOSCOPY (EGD) WITH PROPOFOL;  Surgeon: Rogene Houston, MD;  Location: [facility] ENDO SUITE;  Service: Endoscopy;  Laterality: N/A;   REMOVAL OF STONES  07/03/2021   Procedure: REMOVAL OF STONES;  Surgeon: Jackquline Denmark, MD;  Location: [facility] ENDOSCOPY;  Service: Gastroenterology;;   [name]  07/03/2021   Procedure: [name];  Surgeon: Jackquline Denmark, MD;  Location: [facility] ENDOSCOPY;  Service: Gastroenterology;;   VASCULAR SURGERY      Family History  Problem Relation Age of Onset   Hypertension Other    Heart disease Father    Healthy Sister    Healthy Brother    Cancer Maternal Grandfather    Healthy Brother    Healthy Brother    Healthy Sister    Healthy Sister    Healthy Sister    Healthy Brother    Healthy Son    Healthy Son    Healthy Son     Healthy Son     Current Outpatient Medications on File Prior to Visit  Medication Sig Dispense Refill   acetaminophen (TYLENOL) 500 MG tablet Take 500 mg by mouth daily as needed for mild pain or moderate pain.     bisacodyl (DULCOLAX) 10 MG suppository If not relieved by MOM, give 10 mg Bisacodyl suppositiory rectally X 1 dose in 24 hours as needed     diclofenac Sodium (VOLTAREN) 1 % GEL APPLY 4 GRAMS TOPICALLY TO LEFT KNEE TWICE A DAY FOR ARTHRITIS     ELIQUIS 5 MG TABS tablet TAKE 1 TABLET BY MOUTH TWICE A DAY 60 tablet 2   feeding supplement (ENSURE ENLIVE / ENSURE PLUS) LIQD Take 237 mLs by mouth 3 (three) times daily between meals. 123XX123 mL 12   folic acid (FOLVITE) 1 MG tablet Take 1 tablet (1 mg total) by mouth daily.     Magnesium Hydroxide (MILK OF MAGNESIA PO) If no BM in 3 days, give 30 cc Milk of Magnesium p.o. x 1 dose in 24 hours as needed     meloxicam (MOBIC) 7.5 MG tablet Take 7.5 mg by mouth daily.     metoprolol tartrate (LOPRESSOR) 25 MG tablet Take 25 mg by mouth 2 (two) times daily.     midodrine (PROAMATINE) 5 MG tablet Take 5 mg by mouth daily. AS NEEDED FOR SBP</=100     Multiple Vitamin (MULTIVITAMIN WITH MINERALS) TABS tablet Take 1 tablet by  mouth daily.     NON FORMULARY Diet:regular     pantoprazole (PROTONIX) 40 MG tablet Take 1 tablet (40 mg total) by mouth 2 (two) times daily. 30 tablet 0   Sodium Phosphates (RA SALINE ENEMA RE) If not relieved by Biscodyl suppository, give disposable Saline Enema rectally X 1 dose/24 hrs as needed     tamsulosin (FLOMAX) 0.4 MG CAPS capsule Take 0.4 mg by mouth daily.     thiamine 100 MG tablet Take 1 tablet (100 mg total) by mouth daily. 30 tablet 0   No current facility-administered medications on file prior to visit.    Allergies  Allergen Reactions   Asa [Aspirin] Shortness Of Breath    Social History   Substance and Sexual Activity  Alcohol Use Not Currently   Alcohol/week: 1.0 standard drink   Types: 1 Shots  of liquor per week    Social History   Tobacco Use  Smoking Status Never  Smokeless Tobacco Never    Review of Systems  Constitutional:  Positive for malaise/fatigue.  HENT: Negative.    Eyes: Negative.   Respiratory: Negative.    Cardiovascular: Negative.   Gastrointestinal:  Positive for abdominal pain, heartburn, nausea and vomiting.  Genitourinary:  Positive for frequency.  Musculoskeletal:  Positive for neck pain.  Skin: Negative.   Neurological: Negative.   Endo/Heme/Allergies:  Bruises/bleeds easily.  Psychiatric/Behavioral: Negative.     Objective   Vitals:   07/30/21 0855  BP: 107/70  Pulse: 92  Resp: 14  Temp: 97.8 F (36.6 C)  SpO2: 97%    Physical Exam Vitals reviewed.  Constitutional:      Appearance: Normal appearance. He is normal weight. He is not ill-appearing.  HENT:     Head: Normocephalic and atraumatic.  Eyes:     General: No scleral icterus. Cardiovascular:     Rate and Rhythm: Normal rate and regular rhythm.     Heart sounds: Normal heart sounds. No murmur heard.   No friction rub. No gallop.  Pulmonary:     Effort: Pulmonary effort is normal. No respiratory distress.     Breath sounds: Normal breath sounds. No stridor. No wheezing, rhonchi or rales.  Abdominal:     General: Abdomen is flat. Bowel sounds are normal. There is no distension.     Palpations: Abdomen is soft. There is no mass.     Tenderness: There is no abdominal tenderness. There is no guarding or rebound.     Hernia: No hernia is present.  Skin:    General: Skin is warm and dry.  Neurological:     Mental Status: He is alert and oriented to person, place, and time.  Previous hospitalization reviewed  Assessment  Biliary colic with cholelithiasis, status post ERCP with stone extraction for choledocholithiasis and cholangitis Anticoagulation for splenic vein thrombosis Plan  Patient is scheduled for laparoscopic cholecystectomy on 08/06/2021.  The risks and benefits  of the procedure including bleeding, infection, cardiopulmonary difficulties, hepatobiliary injury, and the possibility of an open procedure were fully explained to the patient, who gave informed consent.  The patient will stay overnight in the hospital.  He will stop his Eliquis 2 days before the procedure.

## 2021-07-30 NOTE — Progress Notes (Signed)
Jon Bennett; [id number]; 1934-10-30   HPI Patient is an 87 year old white male status post ERCP for choledocholithiasis and cholangitis now presents for elective laparoscopic cholecystectomy.  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  He has been at a nursing home but was recently discharged.  He continues to have intermittent nausea and epigastric pain.  He was noted to have gastritis during his initial hospital stay.  He tolerated both procedures well.  He denies any fever, chills, or jaundice.  He has mild dementia. Past Medical History:  Diagnosis Date   Diabetes mellitus without complication (Gray)    Patient is not on any medication for this   GERD (gastroesophageal reflux disease)    Hypertension    Pancreatitis, alcoholic, acute    Splenic vein thrombosis     Past Surgical History:  Procedure Laterality Date   APPENDECTOMY     ERCP N/A 07/03/2021   Procedure: ENDOSCOPIC RETROGRADE CHOLANGIOPANCREATOGRAPHY (ERCP);  Surgeon: Jackquline Denmark, MD;  Location: [facility] ENDOSCOPY;  Service: Gastroenterology;  Laterality: N/A;   ESOPHAGOGASTRODUODENOSCOPY (EGD) WITH PROPOFOL N/A 06/25/2021   Procedure: ESOPHAGOGASTRODUODENOSCOPY (EGD) WITH PROPOFOL;  Surgeon: Rogene Houston, MD;  Location: [facility];  Service: Endoscopy;  Laterality: N/A;   REMOVAL OF STONES  07/03/2021   Procedure: REMOVAL OF STONES;  Surgeon: Jackquline Denmark, MD;  Location: [facility] ENDOSCOPY;  Service: Gastroenterology;;   [name]  07/03/2021   Procedure: [name];  Surgeon: Jackquline Denmark, MD;  Location: [facility] ENDOSCOPY;  Service: Gastroenterology;;   VASCULAR SURGERY      Family History  Problem Relation Age of Onset   Hypertension Other    Heart disease Father    Healthy Sister    Healthy Brother    Cancer Maternal Grandfather    Healthy Brother    Healthy Brother    Healthy Sister    Healthy Sister    Healthy Sister    Healthy Brother    Healthy Son    Healthy Son    Healthy Son     Healthy Son     Current Outpatient Medications on File Prior to Visit  Medication Sig Dispense Refill   acetaminophen (TYLENOL) 500 MG tablet Take 500 mg by mouth daily as needed for mild pain or moderate pain.     bisacodyl (DULCOLAX) 10 MG suppository If not relieved by MOM, give 10 mg Bisacodyl suppositiory rectally X 1 dose in 24 hours as needed     diclofenac Sodium (VOLTAREN) 1 % GEL APPLY 4 GRAMS TOPICALLY TO LEFT KNEE TWICE A DAY FOR ARTHRITIS     ELIQUIS 5 MG TABS tablet TAKE 1 TABLET BY MOUTH TWICE A DAY 60 tablet 2   feeding supplement (ENSURE ENLIVE / ENSURE PLUS) LIQD Take 237 mLs by mouth 3 (three) times daily between meals. 123XX123 mL 12   folic acid (FOLVITE) 1 MG tablet Take 1 tablet (1 mg total) by mouth daily.     Magnesium Hydroxide (MILK OF MAGNESIA PO) If no BM in 3 days, give 30 cc Milk of Magnesium p.o. x 1 dose in 24 hours as needed     meloxicam (MOBIC) 7.5 MG tablet Take 7.5 mg by mouth daily.     metoprolol tartrate (LOPRESSOR) 25 MG tablet Take 25 mg by mouth 2 (two) times daily.     midodrine (PROAMATINE) 5 MG tablet Take 5 mg by mouth daily. AS NEEDED FOR SBP</=100     Multiple Vitamin (MULTIVITAMIN WITH MINERALS) TABS tablet Take 1 tablet by  mouth daily.     NON FORMULARY Diet:regular     pantoprazole (PROTONIX) 40 MG tablet Take 1 tablet (40 mg total) by mouth 2 (two) times daily. 30 tablet 0   Sodium Phosphates (RA SALINE ENEMA RE) If not relieved by Biscodyl suppository, give disposable Saline Enema rectally X 1 dose/24 hrs as needed     tamsulosin (FLOMAX) 0.4 MG CAPS capsule Take 0.4 mg by mouth daily.     thiamine 100 MG tablet Take 1 tablet (100 mg total) by mouth daily. 30 tablet 0   No current facility-administered medications on file prior to visit.    Allergies  Allergen Reactions   Asa [Aspirin] Shortness Of Breath    Social History   Substance and Sexual Activity  Alcohol Use Not Currently   Alcohol/week: 1.0 standard drink   Types: 1 Shots  of liquor per week    Social History   Tobacco Use  Smoking Status Never  Smokeless Tobacco Never    Review of Systems  Constitutional:  Positive for malaise/fatigue.  HENT: Negative.    Eyes: Negative.   Respiratory: Negative.    Cardiovascular: Negative.   Gastrointestinal:  Positive for abdominal pain, heartburn, nausea and vomiting.  Genitourinary:  Positive for frequency.  Musculoskeletal:  Positive for neck pain.  Skin: Negative.   Neurological: Negative.   Endo/Heme/Allergies:  Bruises/bleeds easily.  Psychiatric/Behavioral: Negative.     Objective   Vitals:   07/30/21 0855  BP: 107/70  Pulse: 92  Resp: 14  Temp: 97.8 F (36.6 C)  SpO2: 97%    Physical Exam Vitals reviewed.  Constitutional:      Appearance: Normal appearance. He is normal weight. He is not ill-appearing.  HENT:     Head: Normocephalic and atraumatic.  Eyes:     General: No scleral icterus. Cardiovascular:     Rate and Rhythm: Normal rate and regular rhythm.     Heart sounds: Normal heart sounds. No murmur heard.   No friction rub. No gallop.  Pulmonary:     Effort: Pulmonary effort is normal. No respiratory distress.     Breath sounds: Normal breath sounds. No stridor. No wheezing, rhonchi or rales.  Abdominal:     General: Abdomen is flat. Bowel sounds are normal. There is no distension.     Palpations: Abdomen is soft. There is no mass.     Tenderness: There is no abdominal tenderness. There is no guarding or rebound.     Hernia: No hernia is present.  Skin:    General: Skin is warm and dry.  Neurological:     Mental Status: He is alert and oriented to person, place, and time.  Previous hospitalization reviewed  Assessment  Biliary colic with cholelithiasis, status post ERCP with stone extraction for choledocholithiasis and cholangitis Anticoagulation for splenic vein thrombosis Plan  Patient is scheduled for laparoscopic cholecystectomy on 08/06/2021.  The risks and benefits  of the procedure including bleeding, infection, cardiopulmonary difficulties, hepatobiliary injury, and the possibility of an open procedure were fully explained to the patient, who gave informed consent.  The patient will stay overnight in the hospital.  He will stop his Eliquis 2 days prior to the procedure.

## 2021-07-31 NOTE — Patient Instructions (Signed)
Steffanie RainwaterJames D Seib  07/31/2021     @PREFPERIOPPHARMACY @   Your procedure is scheduled on  08/06/2021.   [facility]  0730  A.M.   Call this number if you have problems the morning of surgery:  [phone]   Remember:  Do not eat or drink after midnight.      Your last dose of eliquis should have been 08/03/2021.     Take these medicines the morning of surgery with A SIP OF WATER              mobic(if needed), metoprolol, protonix, flomax.     Do not wear jewelry, make-up or nail polish.  Do not wear lotions, powders, or perfumes, or deodorant.  Do not shave 48 hours prior to surgery.  Men may shave face and neck.  Do not bring valuables to the hospital.  [facility] is not responsible for any belongings or valuables.  Contacts, dentures or bridgework may not be worn into surgery.  Leave your suitcase in the car.  After surgery it may be brought to your room.  For patients admitted to the hospital, discharge time will be determined by your treatment team.  Patients discharged the day of surgery will not be allowed to drive home and must have someone with them for 24 hours.    Special instructions:   DO NOT smoke tobacco or vape for 24 hours before your procedure.   Please read over the following fact sheets that you were given. Coughing and Deep Breathing, Surgical Site Infection Prevention, Anesthesia Post-op Instructions, and Care and Recovery After Surgery      Minimally Invasive Cholecystectomy, Care After The following information offers guidance on how to care for yourself after your procedure. Your health care provider may also give you more specific instructions. If you have problems or questions, contact your health care provider. What can I expect after the procedure? After the procedure, it is common to have: Pain at your incision sites. You will be given medicines to control this pain. Mild nausea or vomiting. Bloating and possible  shoulder pain from the gas that was used during the procedure. Follow these instructions at home: Medicines Take over-the-counter and prescription medicines only as told by your health care provider. If you were prescribed an antibiotic medicine, take it as told by your health care provider. Do not stop using the antibiotic even if you start to feel better. Ask your health care provider if the medicine prescribed to you: Requires you to avoid driving or using machinery. Can cause constipation. You may need to take these actions to prevent or treat constipation: Drink enough fluid to keep your urine pale yellow. Take over-the-counter or prescription medicines. Eat foods that are high in fiber, such as beans, whole grains, and fresh fruits and vegetables. Limit foods that are high in fat and processed sugars, such as fried or sweet foods. Incision care  Follow instructions from your health care provider about how to take care of your incisions. Make sure you: Wash your hands with soap and water for at least 20 seconds before and after you change your bandage (dressing). If soap and water are not available, use hand sanitizer. Change your dressing as told by your health care provider. Leave stitches (sutures), skin glue, or adhesive strips in place. These skin closures may need to be in place for 2 weeks or longer. If adhesive strip edges start to loosen  and curl up, you may trim the loose edges. Do not remove adhesive strips completely unless your health care provider tells you to do that. Do not take baths, swim, or use a hot tub until your health care provider approves. Ask your health care provider if you may take showers. You may only be allowed to take sponge baths. Check your incision area every day for signs of infection. Check for: More redness, swelling, or pain. Fluid or blood. Warmth. Pus or a bad smell. Activity Rest as told by your health care provider. Do not do activities that  require a lot of effort. Avoid sitting for a long time without moving. Get up to take short walks every 1-2 hours. This is important to improve blood flow and breathing. Ask for help if you feel weak or unsteady. Do not lift anything that is heavier than 10 lb (4.5 kg), or the limit that you are told, until your health care provider says that it is safe. Do not play contact sports until your health care provider approves. Do not return to work or school until your health care provider approves. Return to your normal activities as told by your health care provider. Ask your health care provider what activities are safe for you. General instructions If you were given a sedative during the procedure, it can affect you for several hours. Do not drive or operate machinery until your health care provider says that it is safe. Keep all follow-up visits. This is important. Contact a health care provider if: You develop a rash. You have more redness, swelling, or pain around your incisions. You have fluid or blood coming from your incisions. Your incisions feel warm to the touch. You have pus or a bad smell coming from your incisions. You have a fever. One or more of your incisions breaks open. Get help right away if: You have trouble breathing. You have chest pain. You have more pain in your shoulders. You faint or feel dizzy when you stand. You have severe pain in your abdomen. You have nausea or vomiting that lasts for more than one day. You have leg pain that is new or unusual, or if it is localized to one specific spot. These symptoms may represent a serious problem that is an emergency. Do not wait to see if the symptoms will go away. Get medical help right away. Call your local emergency services (911 in the [place] Do not drive yourself to the hospital. Summary After your procedure, it is common to have pain at the incision sites. You may also have nausea or bloating. Follow your health  care provider's instructions about medicine, activity restrictions, and caring for your incision areas. Do not do activities that require a lot of effort. Contact a health care provider if you have a fever or other signs of infection, such as more redness, swelling, or pain around the incisions. Get help right away if you have chest pain, increasing pain in the shoulders, or trouble breathing. This information is not intended to replace advice given to you by your health care provider. Make sure you discuss any questions you have with your health care provider. Document Revised: 09/02/2020 Document Reviewed: 09/02/2020 Elsevier Patient Education  2023 [employer] General Anesthesia, Adult, Care After This sheet gives you information about how to care for yourself after your procedure. Your health care provider may also give you more specific instructions. If you have problems or questions, contact your health care provider. What can  I expect after the procedure? After the procedure, the following side effects are common: Pain or discomfort at the IV site. Nausea. Vomiting. Sore throat. Trouble concentrating. Feeling cold or chills. Feeling weak or tired. Sleepiness and fatigue. Soreness and body aches. These side effects can affect parts of the body that were not involved in surgery. Follow these instructions at home: For the time period you were told by your health care provider:  Rest. Do not participate in activities where you could fall or become injured. Do not drive or use machinery. Do not drink alcohol. Do not take sleeping pills or medicines that cause drowsiness. Do not make important decisions or sign legal documents. Do not take care of children on your own. Eating and drinking Follow any instructions from your health care provider about eating or drinking restrictions. When you feel hungry, start by eating small amounts of foods that are soft and easy to digest (bland),  such as toast. Gradually return to your regular diet. Drink enough fluid to keep your urine pale yellow. If you vomit, rehydrate by drinking water, juice, or clear broth. General instructions If you have sleep apnea, surgery and certain medicines can increase your risk for breathing problems. Follow instructions from your health care provider about wearing your sleep device: Anytime you are sleeping, including during daytime naps. While taking prescription pain medicines, sleeping medicines, or medicines that make you drowsy. Have a responsible adult stay with you for the time you are told. It is important to have someone help care for you until you are awake and alert. Return to your normal activities as told by your health care provider. Ask your health care provider what activities are safe for you. Take over-the-counter and prescription medicines only as told by your health care provider. If you smoke, do not smoke without supervision. Keep all follow-up visits as told by your health care provider. This is important. Contact a health care provider if: You have nausea or vomiting that does not get better with medicine. You cannot eat or drink without vomiting. You have pain that does not get better with medicine. You are unable to pass urine. You develop a skin rash. You have a fever. You have redness around your IV site that gets worse. Get help right away if: You have difficulty breathing. You have chest pain. You have blood in your urine or stool, or you vomit blood. Summary After the procedure, it is common to have a sore throat or nausea. It is also common to feel tired. Have a responsible adult stay with you for the time you are told. It is important to have someone help care for you until you are awake and alert. When you feel hungry, start by eating small amounts of foods that are soft and easy to digest (bland), such as toast. Gradually return to your regular diet. Drink  enough fluid to keep your urine pale yellow. Return to your normal activities as told by your health care provider. Ask your health care provider what activities are safe for you. This information is not intended to replace advice given to you by your health care provider. Make sure you discuss any questions you have with your health care provider. Document Revised: 11/15/2019 Document Reviewed: 06/14/2019 Elsevier Patient Education  2023 Else[employer] Inc. How to Use Chlorhexidine for Bathing Chlorhexidine gluconate (CHG) is a germ-killing (antiseptic) solution that is used to clean the skin. It can get rid of the bacteria that normally live on the  skin and can keep them away for about 24 hours. To clean your skin with CHG, you may be given: A CHG solution to use in the shower or as part of a sponge bath. A prepackaged cloth that contains CHG. Cleaning your skin with CHG may help lower the risk for infection: While you are staying in the intensive care unit of the hospital. If you have a vascular access, such as a central line, to provide short-term or long-term access to your veins. If you have a catheter to drain urine from your bladder. If you are on a ventilator. A ventilator is a machine that helps you breathe by moving air in and out of your lungs. After surgery. What are the risks? Risks of using CHG include: A skin reaction. Hearing loss, if CHG gets in your ears and you have a perforated eardrum. Eye injury, if CHG gets in your eyes and is not rinsed out. The CHG product catching fire. Make sure that you avoid smoking and flames after applying CHG to your skin. Do not use CHG: If you have a chlorhexidine allergy or have previously reacted to chlorhexidine. On babies younger than 13 months of age. How to use CHG solution Use CHG only as told by your health care provider, and follow the instructions on the label. Use the full amount of CHG as directed. Usually, this is one  bottle. During a shower Follow these steps when using CHG solution during a shower (unless your health care provider gives you different instructions): Start the shower. Use your normal soap and shampoo to wash your face and hair. Turn off the shower or move out of the shower stream. Pour the CHG onto a clean washcloth. Do not use any type of brush or rough-edged sponge. Starting at your neck, lather your body down to your toes. Make sure you follow these instructions: If you will be having surgery, pay special attention to the part of your body where you will be having surgery. Scrub this area for at least 1 minute. Do not use CHG on your head or face. If the solution gets into your ears or eyes, rinse them well with water. Avoid your genital area. Avoid any areas of skin that have broken skin, cuts, or scrapes. Scrub your back and under your arms. Make sure to wash skin folds. Let the lather sit on your skin for 1-2 minutes or as long as told by your health care provider. Thoroughly rinse your entire body in the shower. Make sure that all body creases and crevices are rinsed well. Dry off with a clean towel. Do not put any substances on your body afterward--such as powder, lotion, or perfume--unless you are told to do so by your health care provider. Only use lotions that are recommended by the manufacturer. Put on clean clothes or pajamas. If it is the night before your surgery, sleep in clean sheets.  During a sponge bath Follow these steps when using CHG solution during a sponge bath (unless your health care provider gives you different instructions): Use your normal soap and shampoo to wash your face and hair. Pour the CHG onto a clean washcloth. Starting at your neck, lather your body down to your toes. Make sure you follow these instructions: If you will be having surgery, pay special attention to the part of your body where you will be having surgery. Scrub this area for at least 1  minute. Do not use CHG on your head or face.  If the solution gets into your ears or eyes, rinse them well with water. Avoid your genital area. Avoid any areas of skin that have broken skin, cuts, or scrapes. Scrub your back and under your arms. Make sure to wash skin folds. Let the lather sit on your skin for 1-2 minutes or as long as told by your health care provider. Using a different clean, wet washcloth, thoroughly rinse your entire body. Make sure that all body creases and crevices are rinsed well. Dry off with a clean towel. Do not put any substances on your body afterward--such as powder, lotion, or perfume--unless you are told to do so by your health care provider. Only use lotions that are recommended by the manufacturer. Put on clean clothes or pajamas. If it is the night before your surgery, sleep in clean sheets. How to use CHG prepackaged cloths Only use CHG cloths as told by your health care provider, and follow the instructions on the label. Use the CHG cloth on clean, dry skin. Do not use the CHG cloth on your head or face unless your health care provider tells you to. When washing with the CHG cloth: Avoid your genital area. Avoid any areas of skin that have broken skin, cuts, or scrapes. Before surgery Follow these steps when using a CHG cloth to clean before surgery (unless your health care provider gives you different instructions): Using the CHG cloth, vigorously scrub the part of your body where you will be having surgery. Scrub using a back-and-forth motion for 3 minutes. The area on your body should be completely wet with CHG when you are done scrubbing. Do not rinse. Discard the cloth and let the area air-dry. Do not put any substances on the area afterward, such as powder, lotion, or perfume. Put on clean clothes or pajamas. If it is the night before your surgery, sleep in clean sheets.  For general bathing Follow these steps when using CHG cloths for general  bathing (unless your health care provider gives you different instructions). Use a separate CHG cloth for each area of your body. Make sure you wash between any folds of skin and between your fingers and toes. Wash your body in the following order, switching to a new cloth after each step: The front of your neck, shoulders, and chest. Both of your arms, under your arms, and your hands. Your stomach and groin area, avoiding the genitals. Your right leg and foot. Your left leg and foot. The back of your neck, your back, and your buttocks. Do not rinse. Discard the cloth and let the area air-dry. Do not put any substances on your body afterward--such as powder, lotion, or perfume--unless you are told to do so by your health care provider. Only use lotions that are recommended by the manufacturer. Put on clean clothes or pajamas. Contact a health care provider if: Your skin gets irritated after scrubbing. You have questions about using your solution or cloth. You swallow any chlorhexidine. Call your local poison control center (1-978-566-0752 in the [place] Get help right away if: Your eyes itch badly, or they become very red or swollen. Your skin itches badly and is red or swollen. Your hearing changes. You have trouble seeing. You have swelling or tingling in your mouth or throat. You have trouble breathing. These symptoms may represent a serious problem that is an emergency. Do not wait to see if the symptoms will go away. Get medical help right away. Call your local emergency services (911  in the [place] Do not drive yourself to the hospital. Summary Chlorhexidine gluconate (CHG) is a germ-killing (antiseptic) solution that is used to clean the skin. Cleaning your skin with CHG may help to lower your risk for infection. You may be given CHG to use for bathing. It may be in a bottle or in a prepackaged cloth to use on your skin. Carefully follow your health care provider's instructions and the  instructions on the product label. Do not use CHG if you have a chlorhexidine allergy. Contact your health care provider if your skin gets irritated after scrubbing. This information is not intended to replace advice given to you by your health care provider. Make sure you discuss any questions you have with your health care provider. Document Revised: 05/12/2020 Document Reviewed: 05/12/2020 Elsevier Patient Education  2023 [employer].

## 2021-08-04 ENCOUNTER — Encounter (HOSPITAL_COMMUNITY)
Admission: RE | Admit: 2021-08-04 | Discharge: 2021-08-04 | Disposition: A | Discharge status: Home / Self Care | Payer: Medicare Other | Source: Ambulatory Visit | Attending: General Surgery | Admitting: General Surgery

## 2021-08-04 HISTORY — DX: Cerebral infarction, unspecified: I63.9

## 2021-08-05 ENCOUNTER — Encounter (HOSPITAL_COMMUNITY): Payer: Self-pay

## 2021-08-06 ENCOUNTER — Other Ambulatory Visit: Payer: Self-pay

## 2021-08-06 ENCOUNTER — Observation Stay (HOSPITAL_COMMUNITY)
Admission: RE | Admit: 2021-08-06 | Discharge: 2021-08-07 | Disposition: A | Discharge status: Home / Self Care | Payer: Medicare Other | Source: Ambulatory Visit | Attending: General Surgery | Admitting: General Surgery

## 2021-08-06 ENCOUNTER — Ambulatory Visit (HOSPITAL_BASED_OUTPATIENT_CLINIC_OR_DEPARTMENT_OTHER): Payer: Medicare Other | Admitting: Anesthesiology

## 2021-08-06 ENCOUNTER — Encounter (HOSPITAL_COMMUNITY)
Admission: RE | Disposition: A | Discharge status: Home / Self Care | Payer: Self-pay | Source: Ambulatory Visit | Attending: General Surgery

## 2021-08-06 ENCOUNTER — Encounter (HOSPITAL_COMMUNITY): Payer: Self-pay | Admitting: General Surgery

## 2021-08-06 ENCOUNTER — Ambulatory Visit (HOSPITAL_COMMUNITY): Payer: Medicare Other | Admitting: Anesthesiology

## 2021-08-06 DIAGNOSIS — I10 Essential (primary) hypertension: Secondary | ICD-10-CM | POA: Diagnosis not present

## 2021-08-06 DIAGNOSIS — K8020 Calculus of gallbladder without cholecystitis without obstruction: Secondary | ICD-10-CM | POA: Diagnosis present

## 2021-08-06 DIAGNOSIS — K8001 Calculus of gallbladder with acute cholecystitis with obstruction: Secondary | ICD-10-CM | POA: Diagnosis not present

## 2021-08-06 DIAGNOSIS — K8010 Calculus of gallbladder with chronic cholecystitis without obstruction: Secondary | ICD-10-CM | POA: Diagnosis not present

## 2021-08-06 DIAGNOSIS — Z79899 Other long term (current) drug therapy: Secondary | ICD-10-CM | POA: Insufficient documentation

## 2021-08-06 DIAGNOSIS — N289 Disorder of kidney and ureter, unspecified: Secondary | ICD-10-CM

## 2021-08-06 DIAGNOSIS — E119 Type 2 diabetes mellitus without complications: Secondary | ICD-10-CM | POA: Diagnosis not present

## 2021-08-06 DIAGNOSIS — K219 Gastro-esophageal reflux disease without esophagitis: Secondary | ICD-10-CM | POA: Diagnosis not present

## 2021-08-06 DIAGNOSIS — F0390 Unspecified dementia without behavioral disturbance: Secondary | ICD-10-CM | POA: Insufficient documentation

## 2021-08-06 DIAGNOSIS — K8070 Calculus of gallbladder and bile duct without cholecystitis without obstruction: Secondary | ICD-10-CM | POA: Diagnosis not present

## 2021-08-06 DIAGNOSIS — Z7901 Long term (current) use of anticoagulants: Secondary | ICD-10-CM | POA: Diagnosis not present

## 2021-08-06 HISTORY — PX: CHOLECYSTECTOMY: SHX55

## 2021-08-06 LAB — GLUCOSE, CAPILLARY: Glucose-Capillary: 140 mg/dL — ABNORMAL HIGH (ref 70–99)

## 2021-08-06 SURGERY — LAPAROSCOPIC CHOLECYSTECTOMY
Anesthesia: General

## 2021-08-06 MED ORDER — SODIUM CHLORIDE 0.9 % IV SOLN: INTRAVENOUS | Status: DC

## 2021-08-06 MED ORDER — TRAMADOL HCL 50 MG PO TABS
50.0000 mg | ORAL_TABLET | Freq: Four times a day (QID) | ORAL | Status: DC | PRN
  Administered 2021-08-06: 50 mg via ORAL
  Filled 2021-08-06: qty 1

## 2021-08-06 MED ORDER — ORAL CARE MOUTH RINSE: 15.0000 mL | Freq: Once | OROMUCOSAL | Status: AC

## 2021-08-06 MED ORDER — MORPHINE SULFATE (PF) 2 MG/ML IV SOLN: 2.0000 mg | INTRAVENOUS | Status: DC | PRN

## 2021-08-06 MED ORDER — HEMOSTATIC AGENTS (NO CHARGE) OPTIME
TOPICAL | Status: DC | PRN
Start: 2021-08-06 — End: 2021-08-06
  Administered 2021-08-06: 1 via TOPICAL

## 2021-08-06 MED ORDER — CEFAZOLIN SODIUM-DEXTROSE 2-4 GM/100ML-% IV SOLN
INTRAVENOUS | Status: AC
  Filled 2021-08-06: qty 100

## 2021-08-06 MED ORDER — PANTOPRAZOLE SODIUM 40 MG PO TBEC
40.0000 mg | DELAYED_RELEASE_TABLET | Freq: Two times a day (BID) | ORAL | Status: DC
Start: 2021-08-06 — End: 2021-08-07
  Administered 2021-08-06 – 2021-08-07 (×3): 40 mg via ORAL
  Filled 2021-08-06 (×3): qty 1

## 2021-08-06 MED ORDER — ACETAMINOPHEN 325 MG PO TABS: 650.0000 mg | ORAL_TABLET | Freq: Four times a day (QID) | ORAL | Status: DC | PRN

## 2021-08-06 MED ORDER — METOPROLOL TARTRATE 25 MG PO TABS
25.0000 mg | ORAL_TABLET | Freq: Two times a day (BID) | ORAL | Status: DC
Start: 2021-08-06 — End: 2021-08-07
  Administered 2021-08-06 – 2021-08-07 (×3): 25 mg via ORAL
  Filled 2021-08-06 (×3): qty 1

## 2021-08-06 MED ORDER — ONDANSETRON HCL 4 MG/2ML IJ SOLN: 4.0000 mg | Freq: Once | INTRAMUSCULAR | Status: DC

## 2021-08-06 MED ORDER — LACTATED RINGERS IV SOLN
INTRAVENOUS | Status: DC
  Administered 2021-08-06: 1000 mL via INTRAVENOUS

## 2021-08-06 MED ORDER — SUCCINYLCHOLINE CHLORIDE 200 MG/10ML IV SOSY
PREFILLED_SYRINGE | INTRAVENOUS | Status: DC | PRN
  Administered 2021-08-06: 100 mg via INTRAVENOUS

## 2021-08-06 MED ORDER — BUPIVACAINE LIPOSOME 1.3 % IJ SUSP
INTRAMUSCULAR | Status: DC | PRN
  Administered 2021-08-06: 20 mL

## 2021-08-06 MED ORDER — ONDANSETRON HCL 4 MG/2ML IJ SOLN: 4.0000 mg | Freq: Once | INTRAMUSCULAR | Status: DC | PRN

## 2021-08-06 MED ORDER — HEMOSTATIC AGENTS (NO CHARGE) OPTIME
TOPICAL | Status: DC | PRN
  Administered 2021-08-06: 1 via TOPICAL

## 2021-08-06 MED ORDER — ROCURONIUM BROMIDE 10 MG/ML (PF) SYRINGE
PREFILLED_SYRINGE | INTRAVENOUS | Status: AC
  Filled 2021-08-06: qty 10

## 2021-08-06 MED ORDER — SIMETHICONE 80 MG PO CHEW: 40.0000 mg | CHEWABLE_TABLET | Freq: Four times a day (QID) | ORAL | Status: DC | PRN

## 2021-08-06 MED ORDER — ONDANSETRON HCL 4 MG/2ML IJ SOLN
INTRAMUSCULAR | Status: AC
  Administered 2021-08-06: 4 mg
  Filled 2021-08-06: qty 2

## 2021-08-06 MED ORDER — SUCCINYLCHOLINE CHLORIDE 200 MG/10ML IV SOSY
PREFILLED_SYRINGE | INTRAVENOUS | Status: AC
  Filled 2021-08-06: qty 10

## 2021-08-06 MED ORDER — FOLIC ACID 1 MG PO TABS
1.0000 mg | ORAL_TABLET | Freq: Every day | ORAL | Status: DC
Start: 2021-08-06 — End: 2021-08-07
  Administered 2021-08-06 – 2021-08-07 (×2): 1 mg via ORAL
  Filled 2021-08-06 (×2): qty 1

## 2021-08-06 MED ORDER — ALUM & MAG HYDROXIDE-SIMETH 200-200-20 MG/5ML PO SUSP
15.0000 mL | Freq: Four times a day (QID) | ORAL | Status: DC | PRN
  Administered 2021-08-06: 15 mL via ORAL
  Filled 2021-08-06: qty 30

## 2021-08-06 MED ORDER — MELOXICAM 7.5 MG PO TABS
7.5000 mg | ORAL_TABLET | Freq: Every day | ORAL | Status: DC
  Administered 2021-08-06 – 2021-08-07 (×2): 7.5 mg via ORAL
  Filled 2021-08-06 (×2): qty 1

## 2021-08-06 MED ORDER — PROPOFOL 10 MG/ML IV BOLUS
INTRAVENOUS | Status: DC | PRN
  Administered 2021-08-06: 80 mg via INTRAVENOUS

## 2021-08-06 MED ORDER — ACETAMINOPHEN 650 MG RE SUPP: 650.0000 mg | Freq: Four times a day (QID) | RECTAL | Status: DC | PRN

## 2021-08-06 MED ORDER — CHLORHEXIDINE GLUCONATE CLOTH 2 % EX PADS: 6.0000 | MEDICATED_PAD | Freq: Once | CUTANEOUS | Status: DC

## 2021-08-06 MED ORDER — TAMSULOSIN HCL 0.4 MG PO CAPS
0.4000 mg | ORAL_CAPSULE | Freq: Every day | ORAL | Status: DC
  Administered 2021-08-06 – 2021-08-07 (×2): 0.4 mg via ORAL
  Filled 2021-08-06 (×2): qty 1

## 2021-08-06 MED ORDER — LIDOCAINE HCL (CARDIAC) PF 100 MG/5ML IV SOSY
PREFILLED_SYRINGE | INTRAVENOUS | Status: DC | PRN
Start: 2021-08-06 — End: 2021-08-06
  Administered 2021-08-06: 60 mg via INTRATRACHEAL

## 2021-08-06 MED ORDER — DEXAMETHASONE SODIUM PHOSPHATE 10 MG/ML IJ SOLN
INTRAMUSCULAR | Status: DC | PRN
Start: 2021-08-06 — End: 2021-08-06
  Administered 2021-08-06: 10 mg via INTRAVENOUS

## 2021-08-06 MED ORDER — ENSURE ENLIVE PO LIQD
237.0000 mL | Freq: Three times a day (TID) | ORAL | Status: DC
  Administered 2021-08-06 (×2): 237 mL via ORAL

## 2021-08-06 MED ORDER — LORAZEPAM 2 MG/ML IJ SOLN
0.5000 mg | INTRAMUSCULAR | Status: DC | PRN
  Administered 2021-08-06 – 2021-08-07 (×2): 0.5 mg via INTRAVENOUS
  Filled 2021-08-06 (×2): qty 1

## 2021-08-06 MED ORDER — CEFAZOLIN SODIUM-DEXTROSE 2-4 GM/100ML-% IV SOLN
2.0000 g | INTRAVENOUS | Status: AC
  Administered 2021-08-06: 2 g via INTRAVENOUS

## 2021-08-06 MED ORDER — FAMOTIDINE IN NACL 20-0.9 MG/50ML-% IV SOLN
20.0000 mg | Freq: Once | INTRAVENOUS | Status: AC
  Administered 2021-08-06: 20 mg via INTRAVENOUS
  Filled 2021-08-06: qty 50

## 2021-08-06 MED ORDER — CHLORHEXIDINE GLUCONATE 0.12 % MT SOLN
15.0000 mL | Freq: Once | OROMUCOSAL | Status: AC
  Administered 2021-08-06: 15 mL via OROMUCOSAL

## 2021-08-06 MED ORDER — PHENYLEPHRINE 80 MCG/ML (10ML) SYRINGE FOR IV PUSH (FOR BLOOD PRESSURE SUPPORT)
PREFILLED_SYRINGE | INTRAVENOUS | Status: DC | PRN
  Administered 2021-08-06: 80 ug via INTRAVENOUS

## 2021-08-06 MED ORDER — FENTANYL CITRATE (PF) 100 MCG/2ML IJ SOLN
INTRAMUSCULAR | Status: DC | PRN
  Administered 2021-08-06 (×4): 50 ug via INTRAVENOUS

## 2021-08-06 MED ORDER — MAGNESIUM HYDROXIDE 400 MG/5ML PO SUSP: 30.0000 mL | Freq: Three times a day (TID) | ORAL | Status: DC | PRN

## 2021-08-06 MED ORDER — ONDANSETRON HCL 4 MG/2ML IJ SOLN: 4.0000 mg | Freq: Four times a day (QID) | INTRAMUSCULAR | Status: DC | PRN

## 2021-08-06 MED ORDER — LIDOCAINE HCL (PF) 2 % IJ SOLN
INTRAMUSCULAR | Status: AC
  Filled 2021-08-06: qty 5

## 2021-08-06 MED ORDER — FENTANYL CITRATE (PF) 250 MCG/5ML IJ SOLN
INTRAMUSCULAR | Status: AC
  Filled 2021-08-06: qty 5

## 2021-08-06 MED ORDER — THIAMINE HCL 100 MG PO TABS
100.0000 mg | ORAL_TABLET | Freq: Every day | ORAL | Status: DC
  Administered 2021-08-06 – 2021-08-07 (×2): 100 mg via ORAL
  Filled 2021-08-06 (×2): qty 1

## 2021-08-06 MED ORDER — HYDROMORPHONE HCL 1 MG/ML IJ SOLN: 0.2500 mg | INTRAMUSCULAR | Status: DC | PRN

## 2021-08-06 MED ORDER — ONDANSETRON HCL 4 MG/2ML IJ SOLN
INTRAMUSCULAR | Status: AC
  Filled 2021-08-06: qty 2

## 2021-08-06 MED ORDER — ROCURONIUM BROMIDE 10 MG/ML (PF) SYRINGE
PREFILLED_SYRINGE | INTRAVENOUS | Status: DC | PRN
  Administered 2021-08-06: 35 mg via INTRAVENOUS

## 2021-08-06 MED ORDER — SUGAMMADEX SODIUM 500 MG/5ML IV SOLN
INTRAVENOUS | Status: DC | PRN
Start: 2021-08-06 — End: 2021-08-06
  Administered 2021-08-06: 200 mg via INTRAVENOUS

## 2021-08-06 MED ORDER — MIDODRINE HCL 5 MG PO TABS
5.0000 mg | ORAL_TABLET | Freq: Every day | ORAL | Status: DC
  Administered 2021-08-06 – 2021-08-07 (×2): 5 mg via ORAL
  Filled 2021-08-06 (×2): qty 1

## 2021-08-06 MED ORDER — SODIUM CHLORIDE 0.9 % IR SOLN
Status: DC | PRN
Start: 2021-08-06 — End: 2021-08-06
  Administered 2021-08-06: 1000 mL

## 2021-08-06 MED ORDER — ONDANSETRON 4 MG PO TBDP
4.0000 mg | ORAL_TABLET | Freq: Four times a day (QID) | ORAL | Status: DC | PRN
  Administered 2021-08-06: 4 mg via ORAL
  Filled 2021-08-06: qty 1

## 2021-08-06 SURGICAL SUPPLY — 50 items
ADH SKN CLS APL DERMABOND .7 (GAUZE/BANDAGES/DRESSINGS) ×1
APL ESCP 73.6OZ SRGCL (TIP) ×1
APL PRP STRL LF DISP 70% ISPRP (MISCELLANEOUS) ×1
APPLIER CLIP ROT 10 11.4 M/L (STAPLE) ×2
APR CLP MED LRG 11.4X10 (STAPLE) ×1
BAG RETRIEVAL 10 (BASKET) ×1
CHLORAPREP W/TINT 26 (MISCELLANEOUS) ×2 IMPLANT
CLIP APPLIE ROT 10 11.4 M/L (STAPLE) ×1 IMPLANT
CLOTH BEACON ORANGE TIMEOUT ST (SAFETY) ×2 IMPLANT
COVER LIGHT HANDLE STERIS (MISCELLANEOUS) ×4 IMPLANT
DERMABOND ADVANCED (GAUZE/BANDAGES/DRESSINGS) ×1
DERMABOND ADVANCED .7 DNX12 (GAUZE/BANDAGES/DRESSINGS) ×1 IMPLANT
ELECT REM PT RETURN 9FT ADLT (ELECTROSURGICAL) ×2
ELECTRODE REM PT RTRN 9FT ADLT (ELECTROSURGICAL) ×1 IMPLANT
GLOVE BIO SURGEON STRL SZ7.5 (GLOVE) ×1 IMPLANT
GLOVE BIOGEL M 6.5 STRL (GLOVE) ×1 IMPLANT
GLOVE BIOGEL M 7.0 STRL (GLOVE) ×1 IMPLANT
GLOVE BIOGEL PI IND STRL 7.0 (GLOVE) ×2 IMPLANT
GLOVE BIOGEL PI INDICATOR 7.0 (GLOVE) ×2
GLOVE SURG SS PI 7.5 STRL IVOR (GLOVE) ×2 IMPLANT
GOWN STRL REUS W/TWL LRG LVL3 (GOWN DISPOSABLE) ×6 IMPLANT
HEMOSTAT ARISTA ABSORB 3G PWDR (HEMOSTASIS) IMPLANT
HEMOSTAT SNOW SURGICEL 2X4 (HEMOSTASIS) ×2 IMPLANT
INST SET LAPROSCOPIC AP (KITS) ×2 IMPLANT
KIT TURNOVER KIT A (KITS) ×2 IMPLANT
MANIFOLD NEPTUNE II (INSTRUMENTS) ×2 IMPLANT
NDL HYPO 18GX1.5 BLUNT FILL (NEEDLE) ×1 IMPLANT
NDL HYPO 21X1.5 SAFETY (NEEDLE) ×1 IMPLANT
NDL INSUFFLATION 14GA 120MM (NEEDLE) ×1 IMPLANT
NEEDLE HYPO 18GX1.5 BLUNT FILL (NEEDLE) ×2 IMPLANT
NEEDLE HYPO 21X1.5 SAFETY (NEEDLE) ×2 IMPLANT
NEEDLE INSUFFLATION 14GA 120MM (NEEDLE) ×2 IMPLANT
NS IRRIG 1000ML POUR BTL (IV SOLUTION) ×2 IMPLANT
PACK LAP CHOLE LZT030E (CUSTOM PROCEDURE TRAY) ×2 IMPLANT
PAD ARMBOARD 7.5X6 YLW CONV (MISCELLANEOUS) ×2 IMPLANT
POWDER SURGICEL 3.0 GRAM (HEMOSTASIS) ×1 IMPLANT
SET BASIN LINEN APH (SET/KITS/TRAYS/PACK) ×2 IMPLANT
SET TUBE SMOKE EVAC HIGH FLOW (TUBING) ×2 IMPLANT
SLEEVE Z-THREAD 5X100MM (TROCAR) ×2 IMPLANT
SUT MNCRL AB 4-0 PS2 18 (SUTURE) ×4 IMPLANT
SUT VICRYL 0 UR6 27IN ABS (SUTURE) ×2 IMPLANT
SYR 20ML LL LF (SYRINGE) ×4 IMPLANT
SYS BAG RETRIEVAL 10MM (BASKET) ×1
SYSTEM BAG RETRIEVAL 10MM (BASKET) ×1 IMPLANT
TIP ENDOSCOPIC SURGICEL (TIP) ×1 IMPLANT
TROCAR Z-THRD FIOS HNDL 11X100 (TROCAR) ×2 IMPLANT
TROCAR Z-THREAD FIOS 5X100MM (TROCAR) ×2 IMPLANT
TROCAR Z-THREAD SLEEVE 11X100 (TROCAR) ×2 IMPLANT
TUBE CONNECTING 12X1/4 (SUCTIONS) ×2 IMPLANT
WARMER LAPAROSCOPE (MISCELLANEOUS) ×2 IMPLANT

## 2021-08-06 NOTE — Anesthesia Procedure Notes (Signed)
Procedure Name: Intubation Date/Time: 08/06/2021 9:04 AM Performed by: Karna Dupes, CRNA Pre-anesthesia Checklist: Patient identified, Emergency Drugs available, Suction available and Patient being monitored Patient Re-evaluated:Patient Re-evaluated prior to induction Oxygen Delivery Method: Circle system utilized Preoxygenation: Pre-oxygenation with 100% oxygen Induction Type: IV induction, Rapid sequence and Cricoid Pressure applied Laryngoscope Size: Mac and 4 Grade View: Grade I Tube type: Oral Tube size: 7.5 mm Number of attempts: 1 Airway Equipment and Method: Stylet Placement Confirmation: ETT inserted through vocal cords under direct vision, positive ETCO2 and breath sounds checked- equal and bilateral Secured at: 22 cm Tube secured with: Tape Dental Injury: Teeth and Oropharynx as per pre-operative assessment

## 2021-08-06 NOTE — Op Note (Signed)
Patient:  Jon Bennett  DOB:  [birth year]  MRN:  [id number]   Preop Diagnosis: Biliary colic, cholelithiasis  Postop Diagnosis: Same  Procedure: Laparoscopic cholecystectomy  Surgeon: Franky Macho, MD  Anes: General endotracheal  Indications: Patient is an 87 year old white male status post ERCP with stone extraction for choledocholithiasis with cholangitis who now presents for laparoscopic cholecystectomy due to cholelithiasis and ongoing biliary colic.  The risks and benefits of the procedure including bleeding, infection, hepatobiliary injury, the possibility of an open procedure were fully explained to the patient, who gave informed consent.  The patient has stopped his Eliquis 2 days prior to the procedure.  Procedure note: The patient was placed in the supine position.  After induction of general tracheal anesthesia, the abdomen was prepped and draped using the usual sterile technique with ChloraPrep.  Surgical site confirmation was performed.  A supraumbilical incision was made down to the fascia.  A Veress needle was introduced into the abdominal cavity and confirmation of placement was done using the saline drop test.  The abdomen was then insufflated to 15 mmHg pressure.  An 11 mm trocar was introduced into the abdominal cavity under direct visualization without difficulty.  The patient was placed in reverse Trendelenburg position and an additional 11 mm trocar was placed in the epigastric region and 5 mm trocars were placed the right upper quadrant and right flank regions.  The liver was inspected and noted to be within normal limits.  The gallbladder was retracted in a dynamic fashion in order to provide a critical view of the triangle of Calot.  The cystic duct was first identified.  Its juncture to the infundibulum was fully identified.  Endoclips were placed proximally and distally on the cystic duct, and the cystic duct was divided.  This was likewise done the cystic artery.  The  gallbladder was freed away from the gallbladder fossa using Bovie electrocautery.  The gallbladder was delivered through the epigastric trocar site using an Endo Catch bag.  The gallbladder fossa was inspected and no abnormal bleeding or bile leakage was noted.  Surgicel powder and snow were placed in the gallbladder fossa.  All fluid and air were then evacuated from the abdominal cavity prior to the removal of the trocars.  All wounds were irrigated with normal saline.  All wounds were injected with Exparel.  The supraumbilical fascia as well as epigastric fascia were reapproximated using 0 Vicryl interrupted sutures.  All skin incisions were closed using a 4-0 Monocryl subcuticular suture.  Dermabond was applied.  All tape and needle counts were correct at the end of the procedure.  The patient was extubated in the operating room and transferred to PACU in stable condition.  Complications: None  EBL: Minimal  Specimen: Gallbladder

## 2021-08-06 NOTE — Transfer of Care (Signed)
Immediate Anesthesia Transfer of Care Note  Patient: Jon Bennett  Procedure(s) Performed: LAPAROSCOPIC CHOLECYSTECTOMY  Patient Location: PACU  Anesthesia Type:General  Level of Consciousness: awake, alert  and oriented  Airway & Oxygen Therapy: Patient Spontanous Breathing  Post-op Assessment: Report given to RN and Post -op Vital signs reviewed and stable  Post vital signs: Reviewed and stable  Last Vitals:  Vitals Value Taken Time  BP 178/86   Temp    Pulse 93   Resp 24   SpO2 98%     Last Pain:  Vitals:   08/06/21 0804  TempSrc: Oral  PainSc: 0-No pain      Patients Stated Pain Goal: 8 (08/06/21 0804)  Complications: No notable events documented.

## 2021-08-06 NOTE — Interval H&P Note (Signed)
History and Physical Interval Note:  08/06/2021 8:34 AM  Jon Bennett  has presented today for surgery, with the diagnosis of CHOLELITHIASIS.  The various methods of treatment have been discussed with the patient and family. After consideration of risks, benefits and other options for treatment, the patient has consented to  Procedure(s): LAPAROSCOPIC CHOLECYSTECTOMY (N/A) as a surgical intervention.  The patient's history has been reviewed, patient examined, no change in status, stable for surgery.  I have reviewed the patient's chart and labs.  Questions were answered to the patient's satisfaction.     Franky Macho

## 2021-08-06 NOTE — Anesthesia Postprocedure Evaluation (Signed)
Anesthesia Post Note  Patient: Jon Bennett  Procedure(s) Performed: LAPAROSCOPIC CHOLECYSTECTOMY  Patient location during evaluation: Phase II Anesthesia Type: General Level of consciousness: awake and alert and oriented Pain management: pain level controlled Vital Signs Assessment: post-procedure vital signs reviewed and stable Respiratory status: spontaneous breathing, nonlabored ventilation and respiratory function stable Cardiovascular status: blood pressure returned to baseline and stable Postop Assessment: no apparent nausea or vomiting Anesthetic complications: no   No notable events documented.   Last Vitals:  Vitals:   08/06/21 1115 08/06/21 1136  BP: (!) 129/102 (!) 148/78  Pulse: 73 75  Resp: 20 20  Temp:  36.6 C  SpO2: 99% 100%    Last Pain:  Vitals:   08/06/21 1136  TempSrc: Oral  PainSc:                  Casie Sturgeon C Vanderbilt Ranieri

## 2021-08-06 NOTE — Progress Notes (Signed)
Patient ambulated with assistance to the bathroom. Given 0.5mg  ativan to help rest at patients request. Patient back in bed and resting comfortably.

## 2021-08-06 NOTE — Anesthesia Preprocedure Evaluation (Signed)
Anesthesia Evaluation  Patient identified by MRN, date of birth, ID band Patient awake    Reviewed: Allergy & Precautions, NPO status , Patient's Chart, lab work & pertinent test results, reviewed documented beta blocker date and time   Airway Mallampati: II  TM Distance: >3 FB Neck ROM: Full    Dental  (+) Edentulous Upper, Edentulous Lower   Pulmonary neg pulmonary ROS,    Pulmonary exam normal breath sounds clear to auscultation       Cardiovascular Exercise Tolerance: Good hypertension, Pt. on home beta blockers and Pt. on medications + DVT (splenic vein thrombosis)  Normal cardiovascular exam Rhythm:Regular Rate:Normal     Neuro/Psych PSYCHIATRIC DISORDERS TIACVA    GI/Hepatic Neg liver ROS, GERD  Medicated and Poorly Controlled,(+)     substance abuse  alcohol use,   Endo/Other  diabetes (not on meds), Well Controlled, Type 2  Renal/GU Renal disease  negative genitourinary   Musculoskeletal negative musculoskeletal ROS (+)   Abdominal   Peds negative pediatric ROS (+)  Hematology  (+) Blood dyscrasia, anemia ,   Anesthesia Other Findings   Reproductive/Obstetrics negative OB ROS                          Anesthesia Physical Anesthesia Plan  ASA: 3  Anesthesia Plan: General   Post-op Pain Management: Dilaudid IV   Induction: Intravenous, Rapid sequence and Cricoid pressure planned  PONV Risk Score and Plan: 4 or greater and Ondansetron and Dexamethasone  Airway Management Planned: Oral ETT  Additional Equipment:   Intra-op Plan:   Post-operative Plan: Extubation in OR  Informed Consent: I have reviewed the patients History and Physical, chart, labs and discussed the procedure including the risks, benefits and alternatives for the proposed anesthesia with the patient or authorized representative who has indicated his/her understanding and acceptance.       Plan  Discussed with: CRNA and Surgeon  Anesthesia Plan Comments:        Anesthesia Quick Evaluation

## 2021-08-07 ENCOUNTER — Encounter (HOSPITAL_COMMUNITY): Payer: Self-pay | Admitting: General Surgery

## 2021-08-07 DIAGNOSIS — M6281 Muscle weakness (generalized): Secondary | ICD-10-CM | POA: Diagnosis not present

## 2021-08-07 DIAGNOSIS — E46 Unspecified protein-calorie malnutrition: Secondary | ICD-10-CM | POA: Diagnosis not present

## 2021-08-07 DIAGNOSIS — E872 Acidosis, unspecified: Secondary | ICD-10-CM | POA: Diagnosis not present

## 2021-08-07 DIAGNOSIS — Z8673 Personal history of transient ischemic attack (TIA), and cerebral infarction without residual deficits: Secondary | ICD-10-CM | POA: Diagnosis not present

## 2021-08-07 DIAGNOSIS — Z886 Allergy status to analgesic agent status: Secondary | ICD-10-CM | POA: Diagnosis not present

## 2021-08-07 DIAGNOSIS — D696 Thrombocytopenia, unspecified: Secondary | ICD-10-CM | POA: Diagnosis not present

## 2021-08-07 DIAGNOSIS — E1151 Type 2 diabetes mellitus with diabetic peripheral angiopathy without gangrene: Secondary | ICD-10-CM | POA: Diagnosis not present

## 2021-08-07 DIAGNOSIS — R109 Unspecified abdominal pain: Secondary | ICD-10-CM | POA: Diagnosis not present

## 2021-08-07 DIAGNOSIS — Z8249 Family history of ischemic heart disease and other diseases of the circulatory system: Secondary | ICD-10-CM | POA: Diagnosis not present

## 2021-08-07 DIAGNOSIS — I700 Atherosclerosis of aorta: Secondary | ICD-10-CM | POA: Diagnosis not present

## 2021-08-07 DIAGNOSIS — E119 Type 2 diabetes mellitus without complications: Secondary | ICD-10-CM | POA: Diagnosis not present

## 2021-08-07 DIAGNOSIS — M25562 Pain in left knee: Secondary | ICD-10-CM | POA: Diagnosis not present

## 2021-08-07 DIAGNOSIS — Z7189 Other specified counseling: Secondary | ICD-10-CM | POA: Diagnosis not present

## 2021-08-07 DIAGNOSIS — M1712 Unilateral primary osteoarthritis, left knee: Secondary | ICD-10-CM | POA: Diagnosis not present

## 2021-08-07 DIAGNOSIS — G8918 Other acute postprocedural pain: Secondary | ICD-10-CM | POA: Diagnosis not present

## 2021-08-07 DIAGNOSIS — R1031 Right lower quadrant pain: Secondary | ICD-10-CM | POA: Diagnosis not present

## 2021-08-07 DIAGNOSIS — R079 Chest pain, unspecified: Secondary | ICD-10-CM | POA: Diagnosis not present

## 2021-08-07 DIAGNOSIS — K449 Diaphragmatic hernia without obstruction or gangrene: Secondary | ICD-10-CM | POA: Diagnosis not present

## 2021-08-07 DIAGNOSIS — D649 Anemia, unspecified: Secondary | ICD-10-CM | POA: Diagnosis not present

## 2021-08-07 DIAGNOSIS — K915 Postcholecystectomy syndrome: Secondary | ICD-10-CM | POA: Diagnosis not present

## 2021-08-07 DIAGNOSIS — E440 Moderate protein-calorie malnutrition: Secondary | ICD-10-CM | POA: Diagnosis not present

## 2021-08-07 DIAGNOSIS — I82890 Acute embolism and thrombosis of other specified veins: Secondary | ICD-10-CM | POA: Diagnosis not present

## 2021-08-07 DIAGNOSIS — R627 Adult failure to thrive: Secondary | ICD-10-CM | POA: Diagnosis not present

## 2021-08-07 DIAGNOSIS — F1011 Alcohol abuse, in remission: Secondary | ICD-10-CM | POA: Diagnosis not present

## 2021-08-07 DIAGNOSIS — K860 Alcohol-induced chronic pancreatitis: Secondary | ICD-10-CM | POA: Diagnosis not present

## 2021-08-07 DIAGNOSIS — K219 Gastro-esophageal reflux disease without esophagitis: Secondary | ICD-10-CM | POA: Diagnosis present

## 2021-08-07 DIAGNOSIS — R0902 Hypoxemia: Secondary | ICD-10-CM | POA: Diagnosis not present

## 2021-08-07 DIAGNOSIS — I81 Portal vein thrombosis: Secondary | ICD-10-CM | POA: Diagnosis not present

## 2021-08-07 DIAGNOSIS — R000 Tachycardia, unspecified: Secondary | ICD-10-CM | POA: Diagnosis not present

## 2021-08-07 DIAGNOSIS — I480 Paroxysmal atrial fibrillation: Secondary | ICD-10-CM | POA: Diagnosis not present

## 2021-08-07 DIAGNOSIS — Z20822 Contact with and (suspected) exposure to covid-19: Secondary | ICD-10-CM | POA: Diagnosis not present

## 2021-08-07 DIAGNOSIS — E876 Hypokalemia: Secondary | ICD-10-CM | POA: Diagnosis present

## 2021-08-07 DIAGNOSIS — M79601 Pain in right arm: Secondary | ICD-10-CM | POA: Diagnosis not present

## 2021-08-07 DIAGNOSIS — I959 Hypotension, unspecified: Secondary | ICD-10-CM | POA: Diagnosis not present

## 2021-08-07 DIAGNOSIS — L89101 Pressure ulcer of unspecified part of back, stage 1: Secondary | ICD-10-CM | POA: Diagnosis not present

## 2021-08-07 DIAGNOSIS — I10 Essential (primary) hypertension: Secondary | ICD-10-CM | POA: Diagnosis not present

## 2021-08-07 DIAGNOSIS — R188 Other ascites: Secondary | ICD-10-CM | POA: Diagnosis not present

## 2021-08-07 DIAGNOSIS — Z66 Do not resuscitate: Secondary | ICD-10-CM | POA: Diagnosis not present

## 2021-08-07 DIAGNOSIS — R2689 Other abnormalities of gait and mobility: Secondary | ICD-10-CM | POA: Diagnosis not present

## 2021-08-07 DIAGNOSIS — R54 Age-related physical debility: Secondary | ICD-10-CM | POA: Diagnosis not present

## 2021-08-07 DIAGNOSIS — R0689 Other abnormalities of breathing: Secondary | ICD-10-CM | POA: Diagnosis not present

## 2021-08-07 DIAGNOSIS — Z7901 Long term (current) use of anticoagulants: Secondary | ICD-10-CM | POA: Diagnosis not present

## 2021-08-07 DIAGNOSIS — N401 Enlarged prostate with lower urinary tract symptoms: Secondary | ICD-10-CM | POA: Diagnosis not present

## 2021-08-07 DIAGNOSIS — K668 Other specified disorders of peritoneum: Secondary | ICD-10-CM | POA: Diagnosis not present

## 2021-08-07 DIAGNOSIS — Z1159 Encounter for screening for other viral diseases: Secondary | ICD-10-CM | POA: Diagnosis not present

## 2021-08-07 DIAGNOSIS — I4891 Unspecified atrial fibrillation: Secondary | ICD-10-CM | POA: Diagnosis not present

## 2021-08-07 LAB — CBC
HCT: 31.5 % — ABNORMAL LOW (ref 39.0–52.0)
HCT: 36.5 % — ABNORMAL LOW (ref 39.0–52.0)
Hemoglobin: 10.8 g/dL — ABNORMAL LOW (ref 13.0–17.0)
Hemoglobin: 12.3 g/dL — ABNORMAL LOW (ref 13.0–17.0)
MCH: 31.3 pg (ref 26.0–34.0)
MCH: 31.1 pg (ref 26.0–34.0)
MCHC: 34.3 g/dL (ref 30.0–36.0)
MCHC: 33.7 g/dL (ref 30.0–36.0)
MCV: 91.3 fL (ref 80.0–100.0)
MCV: 92.2 fL (ref 80.0–100.0)
Platelets: 80 10*3/uL — ABNORMAL LOW (ref 150–400)
Platelets: 98 10*3/uL — ABNORMAL LOW (ref 150–400)
RBC: 3.45 MIL/uL — ABNORMAL LOW (ref 4.22–5.81)
RBC: 3.96 MIL/uL — ABNORMAL LOW (ref 4.22–5.81)
RDW: 13.6 % (ref 11.5–15.5)
RDW: 13.7 % (ref 11.5–15.5)
WBC: 9.8 10*3/uL (ref 4.0–10.5)
WBC: 13.2 10*3/uL — ABNORMAL HIGH (ref 4.0–10.5)
nRBC: 0 % (ref 0.0–0.2)
nRBC: 0 % (ref 0.0–0.2)

## 2021-08-07 LAB — COMPREHENSIVE METABOLIC PANEL
ALT: 14 U/L (ref 0–44)
AST: 32 U/L (ref 15–41)
Albumin: 2.9 g/dL — ABNORMAL LOW (ref 3.5–5.0)
Alkaline Phosphatase: 70 U/L (ref 38–126)
Anion gap: 4 — ABNORMAL LOW (ref 5–15)
BUN: 16 mg/dL (ref 8–23)
CO2: 25 mmol/L (ref 22–32)
Calcium: 8.8 mg/dL — ABNORMAL LOW (ref 8.9–10.3)
Chloride: 108 mmol/L (ref 98–111)
Creatinine, Ser: 0.75 mg/dL (ref 0.61–1.24)
GFR, Estimated: >60 mL/min (ref >60)
Glucose, Bld: 144 mg/dL — ABNORMAL HIGH (ref 70–99)
Potassium: 4.6 mmol/L (ref 3.5–5.1)
Sodium: 137 mmol/L (ref 135–145)
Total Bilirubin: 0.8 mg/dL (ref 0.3–1.2)
Total Protein: 5.4 g/dL — ABNORMAL LOW (ref 6.5–8.1)

## 2021-08-07 LAB — SURGICAL PATHOLOGY

## 2021-08-07 NOTE — Progress Notes (Signed)
Pt was released to son [name] who met nursing staff at front entrance. Pt was alert and able to make needs known. Denied any pain of discomfort. AVS in hand

## 2021-08-07 NOTE — Discharge Summary (Signed)
Physician Discharge Summary  Patient ID: Jon Bennett MRN: [id number] DOB/AGE: 1934/12/29 87 y.o.  Admit date: 08/06/2021 Discharge date: 08/07/2021  Admission Diagnoses: Cholelithiasis  Discharge Diagnoses: Same Principal Problem:   Cholelithiasis chronic anticoagulation, splenic vein thrombosis, hypertension Dementia Discharged Condition: good  Hospital Course: Patient is an 87 year old white male status post ERCP with stone extraction for cholangitis during a previous hospitalization who presented for laparoscopic cholecystectomy due to cholelithiasis.  This was performed on 08/06/2021.  He tolerated the procedure well.  He did well overnight and was tolerating a regular diet this morning.  He is being discharged home on 08/07/2021 in good and improving condition.  Treatments: surgery: Laparoscopic cholecystectomy on 08/06/2021  Discharge Exam: Blood pressure 122/70, pulse 80, temperature 98.4 F (36.9 C), resp. rate 20, height 5\' 7"  (1.702 m), weight 64.4 kg, SpO2 99 %. General appearance: alert, cooperative, and no distress Resp: clear to auscultation bilaterally Cardio: regular rate and rhythm, S1, S2 normal, no murmur, click, rub or gallop GI: Soft, flat, incisions healing well.  Disposition: Discharge disposition: 01-Home or Self Care       Discharge Instructions     Diet - low sodium heart healthy   Complete by: As directed    Increase activity slowly   Complete by: As directed       Allergies as of 08/07/2021       Reactions   Asa [aspirin] Shortness Of Breath        Medication List     TAKE these medications    acamprosate 333 MG tablet Commonly known as: CAMPRAL Take 666 mg by mouth 3 (three) times daily.   acetaminophen 500 MG tablet Commonly known as: TYLENOL Take 500 mg by mouth daily as needed for mild pain or moderate pain.   bisacodyl 10 MG suppository Commonly known as: DULCOLAX If not relieved by MOM, give 10 mg Bisacodyl suppositiory  rectally X 1 dose in 24 hours as needed   diclofenac Sodium 1 % Gel Commonly known as: VOLTAREN APPLY 4 GRAMS TOPICALLY TO LEFT KNEE TWICE A DAY FOR ARTHRITIS   Eliquis 5 MG Tabs tablet Generic drug: apixaban TAKE 1 TABLET BY MOUTH TWICE A DAY   feeding supplement Liqd Take 237 mLs by mouth 3 (three) times daily between meals.   folic acid 1 MG tablet Commonly known as: FOLVITE Take 1 tablet (1 mg total) by mouth daily.   meloxicam 7.5 MG tablet Commonly known as: MOBIC Take 7.5 mg by mouth daily.   metoprolol tartrate 25 MG tablet Commonly known as: LOPRESSOR Take 25 mg by mouth 2 (two) times daily.   midodrine 5 MG tablet Commonly known as: PROAMATINE Take 5 mg by mouth daily. AS NEEDED FOR SBP</=100   MILK OF MAGNESIA PO If no BM in 3 days, give 30 cc Milk of Magnesium p.o. x 1 dose in 24 hours as needed   multivitamin with minerals Tabs tablet Take 1 tablet by mouth daily.   pantoprazole 40 MG tablet Commonly known as: Protonix Take 1 tablet (40 mg total) by mouth 2 (two) times daily.   RA SALINE ENEMA RE If not relieved by Biscodyl suppository, give disposable Saline Enema rectally X 1 dose/24 hrs as needed   tamsulosin 0.4 MG Caps capsule Commonly known as: FLOMAX Take 0.4 mg by mouth daily.   thiamine 100 MG tablet Take 1 tablet (100 mg total) by mouth daily.        Follow-up Information     08/09/2021,  [name], MD Follow up.   Specialty: General Surgery Why: As needed Contact information: [address] [place] [address] [phone]                 Signed: Franky Macho 08/07/2021, 11:36 AM

## 2021-08-07 NOTE — Care Management Obs Status (Signed)
MEDICARE OBSERVATION STATUS NOTIFICATION   Patient Details  Name: Jon Bennett MRN: [id number] Date of Birth: 1934/09/10   Medicare Observation Status Notification Given:  Yes    Corey Harold 08/07/2021, 12:06 PM

## 2021-08-09 ENCOUNTER — Emergency Department (HOSPITAL_COMMUNITY): Payer: Medicare Other

## 2021-08-09 ENCOUNTER — Inpatient Hospital Stay (HOSPITAL_COMMUNITY)
Admission: EM | Admit: 2021-08-09 | Discharge: 2021-08-12 | DRG: 947 | Disposition: A | Discharge status: Skilled Nursing Facility | Payer: Medicare Other | Attending: Family Medicine | Admitting: Family Medicine

## 2021-08-09 ENCOUNTER — Encounter (HOSPITAL_COMMUNITY): Payer: Self-pay

## 2021-08-09 ENCOUNTER — Other Ambulatory Visit: Payer: Self-pay

## 2021-08-09 DIAGNOSIS — Z7901 Long term (current) use of anticoagulants: Secondary | ICD-10-CM

## 2021-08-09 DIAGNOSIS — K219 Gastro-esophageal reflux disease without esophagitis: Secondary | ICD-10-CM | POA: Diagnosis present

## 2021-08-09 DIAGNOSIS — R109 Unspecified abdominal pain: Secondary | ICD-10-CM | POA: Diagnosis not present

## 2021-08-09 DIAGNOSIS — F1011 Alcohol abuse, in remission: Secondary | ICD-10-CM | POA: Diagnosis present

## 2021-08-09 DIAGNOSIS — E46 Unspecified protein-calorie malnutrition: Secondary | ICD-10-CM | POA: Diagnosis present

## 2021-08-09 DIAGNOSIS — I4891 Unspecified atrial fibrillation: Secondary | ICD-10-CM | POA: Diagnosis present

## 2021-08-09 DIAGNOSIS — Z8249 Family history of ischemic heart disease and other diseases of the circulatory system: Secondary | ICD-10-CM

## 2021-08-09 DIAGNOSIS — R000 Tachycardia, unspecified: Secondary | ICD-10-CM

## 2021-08-09 DIAGNOSIS — Z66 Do not resuscitate: Secondary | ICD-10-CM | POA: Diagnosis not present

## 2021-08-09 DIAGNOSIS — Z6823 Body mass index (BMI) 23.0-23.9, adult: Secondary | ICD-10-CM

## 2021-08-09 DIAGNOSIS — I81 Portal vein thrombosis: Secondary | ICD-10-CM | POA: Diagnosis present

## 2021-08-09 DIAGNOSIS — G8918 Other acute postprocedural pain: Principal | ICD-10-CM | POA: Diagnosis present

## 2021-08-09 DIAGNOSIS — I959 Hypotension, unspecified: Secondary | ICD-10-CM | POA: Diagnosis not present

## 2021-08-09 DIAGNOSIS — I10 Essential (primary) hypertension: Secondary | ICD-10-CM | POA: Diagnosis present

## 2021-08-09 DIAGNOSIS — D696 Thrombocytopenia, unspecified: Secondary | ICD-10-CM | POA: Diagnosis present

## 2021-08-09 DIAGNOSIS — L89151 Pressure ulcer of sacral region, stage 1: Secondary | ICD-10-CM

## 2021-08-09 DIAGNOSIS — E440 Moderate protein-calorie malnutrition: Secondary | ICD-10-CM | POA: Diagnosis present

## 2021-08-09 DIAGNOSIS — E872 Acidosis, unspecified: Secondary | ICD-10-CM

## 2021-08-09 DIAGNOSIS — R1031 Right lower quadrant pain: Secondary | ICD-10-CM | POA: Diagnosis present

## 2021-08-09 DIAGNOSIS — I82890 Acute embolism and thrombosis of other specified veins: Secondary | ICD-10-CM | POA: Diagnosis present

## 2021-08-09 DIAGNOSIS — Z20822 Contact with and (suspected) exposure to covid-19: Secondary | ICD-10-CM | POA: Diagnosis present

## 2021-08-09 DIAGNOSIS — Z79899 Other long term (current) drug therapy: Secondary | ICD-10-CM

## 2021-08-09 DIAGNOSIS — Z886 Allergy status to analgesic agent status: Secondary | ICD-10-CM

## 2021-08-09 DIAGNOSIS — L89101 Pressure ulcer of unspecified part of back, stage 1: Secondary | ICD-10-CM | POA: Diagnosis present

## 2021-08-09 DIAGNOSIS — Z8673 Personal history of transient ischemic attack (TIA), and cerebral infarction without residual deficits: Secondary | ICD-10-CM

## 2021-08-09 DIAGNOSIS — E119 Type 2 diabetes mellitus without complications: Secondary | ICD-10-CM

## 2021-08-09 DIAGNOSIS — R627 Adult failure to thrive: Secondary | ICD-10-CM | POA: Diagnosis present

## 2021-08-09 DIAGNOSIS — D649 Anemia, unspecified: Secondary | ICD-10-CM | POA: Diagnosis present

## 2021-08-09 DIAGNOSIS — E876 Hypokalemia: Secondary | ICD-10-CM | POA: Diagnosis present

## 2021-08-09 DIAGNOSIS — Z791 Long term (current) use of non-steroidal anti-inflammatories (NSAID): Secondary | ICD-10-CM

## 2021-08-09 DIAGNOSIS — R509 Fever, unspecified: Secondary | ICD-10-CM

## 2021-08-09 DIAGNOSIS — L8990 Pressure ulcer of unspecified site, unspecified stage: Secondary | ICD-10-CM | POA: Diagnosis present

## 2021-08-09 DIAGNOSIS — R54 Age-related physical debility: Secondary | ICD-10-CM | POA: Diagnosis present

## 2021-08-09 DIAGNOSIS — I480 Paroxysmal atrial fibrillation: Secondary | ICD-10-CM | POA: Clinically undetermined

## 2021-08-09 LAB — CBC WITH DIFFERENTIAL/PLATELET
Abs Immature Granulocytes: 0.01 10*3/uL (ref 0.00–0.07)
Basophils Absolute: 0 10*3/uL (ref 0.0–0.1)
Basophils Relative: 0 %
Eosinophils Absolute: 0 10*3/uL (ref 0.0–0.5)
Eosinophils Relative: 0 %
HCT: 38.8 % — ABNORMAL LOW (ref 39.0–52.0)
Hemoglobin: 13.2 g/dL (ref 13.0–17.0)
Immature Granulocytes: 0 %
Lymphocytes Relative: 13 %
Lymphs Abs: 0.7 10*3/uL (ref 0.7–4.0)
MCH: 31.1 pg (ref 26.0–34.0)
MCHC: 34 g/dL (ref 30.0–36.0)
MCV: 91.3 fL (ref 80.0–100.0)
Monocytes Absolute: 0.2 10*3/uL (ref 0.1–1.0)
Monocytes Relative: 4 %
Neutro Abs: 4.3 10*3/uL (ref 1.7–7.7)
Neutrophils Relative %: 83 %
Platelets: 145 10*3/uL — ABNORMAL LOW (ref 150–400)
RBC: 4.25 MIL/uL (ref 4.22–5.81)
RDW: 13.8 % (ref 11.5–15.5)
WBC: 5.3 10*3/uL (ref 4.0–10.5)
nRBC: 0 % (ref 0.0–0.2)

## 2021-08-09 LAB — MAGNESIUM: Magnesium: 1.6 mg/dL — ABNORMAL LOW (ref 1.7–2.4)

## 2021-08-09 LAB — I-STAT CHEM 8, ED
BUN: 15 mg/dL (ref 8–23)
Calcium, Ion: 1.13 mmol/L — ABNORMAL LOW (ref 1.15–1.40)
Chloride: 103 mmol/L (ref 98–111)
Creatinine, Ser: 0.6 mg/dL — ABNORMAL LOW (ref 0.61–1.24)
Glucose, Bld: 141 mg/dL — ABNORMAL HIGH (ref 70–99)
HCT: 32 % — ABNORMAL LOW (ref 39.0–52.0)
Hemoglobin: 10.9 g/dL — ABNORMAL LOW (ref 13.0–17.0)
Potassium: 3.3 mmol/L — ABNORMAL LOW (ref 3.5–5.1)
Sodium: 138 mmol/L (ref 135–145)
TCO2: 25 mmol/L (ref 22–32)

## 2021-08-09 LAB — LACTIC ACID, PLASMA
Lactic Acid, Venous: 3.3 mmol/L (ref 0.5–1.9)
Lactic Acid, Venous: 1.7 mmol/L (ref 0.5–1.9)
Lactic Acid, Venous: 1.2 mmol/L (ref 0.5–1.9)

## 2021-08-09 LAB — COMPREHENSIVE METABOLIC PANEL
ALT: 13 U/L (ref 0–44)
AST: 23 U/L (ref 15–41)
Albumin: 3.4 g/dL — ABNORMAL LOW (ref 3.5–5.0)
Alkaline Phosphatase: 88 U/L (ref 38–126)
Anion gap: 9 (ref 5–15)
BUN: 15 mg/dL (ref 8–23)
CO2: 26 mmol/L (ref 22–32)
Calcium: 8.8 mg/dL — ABNORMAL LOW (ref 8.9–10.3)
Chloride: 104 mmol/L (ref 98–111)
Creatinine, Ser: 0.77 mg/dL (ref 0.61–1.24)
GFR, Estimated: >60 mL/min (ref >60)
Glucose, Bld: 166 mg/dL — ABNORMAL HIGH (ref 70–99)
Potassium: 3.4 mmol/L — ABNORMAL LOW (ref 3.5–5.1)
Sodium: 139 mmol/L (ref 135–145)
Total Bilirubin: 1.7 mg/dL — ABNORMAL HIGH (ref 0.3–1.2)
Total Protein: 6.9 g/dL (ref 6.5–8.1)

## 2021-08-09 LAB — TROPONIN I (HIGH SENSITIVITY)
Troponin I (High Sensitivity): 16 ng/L (ref <18)
Troponin I (High Sensitivity): 20 ng/L — ABNORMAL HIGH (ref <18)

## 2021-08-09 LAB — TSH: TSH: 0.97 u[IU]/mL (ref 0.350–4.500)

## 2021-08-09 LAB — PROTIME-INR
INR: 1.1 (ref 0.8–1.2)
Prothrombin Time: 14 seconds (ref 11.4–15.2)

## 2021-08-09 LAB — LIPASE, BLOOD: Lipase: 21 U/L (ref 11–51)

## 2021-08-09 LAB — T4, FREE: Free T4: 1.1 ng/dL (ref 0.61–1.12)

## 2021-08-09 LAB — SARS CORONAVIRUS 2 BY RT PCR: SARS Coronavirus 2 by RT PCR: NEGATIVE

## 2021-08-09 MED ORDER — ONDANSETRON HCL 4 MG/2ML IJ SOLN
4.0000 mg | Freq: Four times a day (QID) | INTRAMUSCULAR | Status: DC | PRN
  Administered 2021-08-09 – 2021-08-10 (×3): 4 mg via INTRAVENOUS
  Filled 2021-08-09 (×4): qty 2

## 2021-08-09 MED ORDER — VANCOMYCIN HCL 1250 MG/250ML IV SOLN
1250.0000 mg | INTRAVENOUS | Status: DC
Start: 2021-08-10 — End: 2021-08-09

## 2021-08-09 MED ORDER — MAGNESIUM SULFATE 2 GM/50ML IV SOLN
2.0000 g | Freq: Once | INTRAVENOUS | Status: AC
  Administered 2021-08-09: 2 g via INTRAVENOUS
  Filled 2021-08-09: qty 50

## 2021-08-09 MED ORDER — ENSURE ENLIVE PO LIQD
237.0000 mL | Freq: Three times a day (TID) | ORAL | Status: DC
  Administered 2021-08-09 – 2021-08-12 (×5): 237 mL via ORAL
  Filled 2021-08-09 (×5): qty 237

## 2021-08-09 MED ORDER — METOPROLOL TARTRATE 25 MG PO TABS
25.0000 mg | ORAL_TABLET | Freq: Two times a day (BID) | ORAL | Status: DC
  Administered 2021-08-09 – 2021-08-10 (×2): 25 mg via ORAL
  Filled 2021-08-09 (×2): qty 1

## 2021-08-09 MED ORDER — HYDROMORPHONE HCL 1 MG/ML IJ SOLN
0.5000 mg | Freq: Once | INTRAMUSCULAR | Status: AC
  Administered 2021-08-09: 0.5 mg via INTRAVENOUS
  Filled 2021-08-09: qty 0.5

## 2021-08-09 MED ORDER — ACETAMINOPHEN 325 MG PO TABS: 650.0000 mg | ORAL_TABLET | Freq: Once | ORAL | Status: DC

## 2021-08-09 MED ORDER — AMIODARONE HCL IN DEXTROSE 360-4.14 MG/200ML-% IV SOLN
30.0000 mg/h | INTRAVENOUS | Status: AC
Start: 2021-08-09 — End: 2021-08-10
  Administered 2021-08-09 – 2021-08-10 (×2): 30 mg/h via INTRAVENOUS
  Filled 2021-08-09: qty 200

## 2021-08-09 MED ORDER — CHLORHEXIDINE GLUCONATE CLOTH 2 % EX PADS
6.0000 | MEDICATED_PAD | Freq: Every day | CUTANEOUS | Status: DC
  Administered 2021-08-09 – 2021-08-12 (×4): 6 via TOPICAL

## 2021-08-09 MED ORDER — VANCOMYCIN HCL IN DEXTROSE 1-5 GM/200ML-% IV SOLN
1000.0000 mg | Freq: Once | INTRAVENOUS | Status: DC
  Administered 2021-08-09: 1000 mg via INTRAVENOUS
  Filled 2021-08-09: qty 200

## 2021-08-09 MED ORDER — ACETAMINOPHEN 325 MG PO TABS
650.0000 mg | ORAL_TABLET | Freq: Four times a day (QID) | ORAL | Status: DC | PRN
  Administered 2021-08-09: 650 mg via ORAL
  Filled 2021-08-09: qty 2

## 2021-08-09 MED ORDER — SIMETHICONE 80 MG PO CHEW
40.0000 mg | CHEWABLE_TABLET | Freq: Four times a day (QID) | ORAL | Status: DC | PRN
  Administered 2021-08-09: 40 mg via ORAL
  Filled 2021-08-09: qty 1

## 2021-08-09 MED ORDER — LORAZEPAM 2 MG/ML IJ SOLN
0.5000 mg | INTRAMUSCULAR | Status: DC | PRN
Start: 2021-08-09 — End: 2021-08-12
  Administered 2021-08-09 – 2021-08-11 (×3): 0.5 mg via INTRAVENOUS
  Filled 2021-08-09 (×3): qty 1

## 2021-08-09 MED ORDER — AMIODARONE HCL IN DEXTROSE 360-4.14 MG/200ML-% IV SOLN
60.0000 mg/h | INTRAVENOUS | Status: AC
Start: 2021-08-09 — End: 2021-08-09
  Administered 2021-08-09: 60 mg/h via INTRAVENOUS
  Filled 2021-08-09 (×2): qty 200

## 2021-08-09 MED ORDER — ACETAMINOPHEN 650 MG RE SUPP: 650.0000 mg | Freq: Four times a day (QID) | RECTAL | Status: DC | PRN

## 2021-08-09 MED ORDER — MORPHINE SULFATE (PF) 2 MG/ML IV SOLN
2.0000 mg | INTRAVENOUS | Status: DC | PRN
  Administered 2021-08-09 – 2021-08-10 (×3): 2 mg via INTRAVENOUS
  Filled 2021-08-09 (×3): qty 1

## 2021-08-09 MED ORDER — ACETAMINOPHEN 650 MG RE SUPP
650.0000 mg | Freq: Once | RECTAL | Status: AC
  Administered 2021-08-09: 650 mg via RECTAL
  Filled 2021-08-09: qty 1

## 2021-08-09 MED ORDER — VANCOMYCIN HCL 1250 MG/250ML IV SOLN: 1250.0000 mg | INTRAVENOUS | Status: DC

## 2021-08-09 MED ORDER — LACTATED RINGERS IV SOLN: INTRAVENOUS | Status: DC

## 2021-08-09 MED ORDER — TAMSULOSIN HCL 0.4 MG PO CAPS
0.4000 mg | ORAL_CAPSULE | Freq: Every day | ORAL | Status: DC
  Administered 2021-08-09 – 2021-08-12 (×4): 0.4 mg via ORAL
  Filled 2021-08-09 (×5): qty 1

## 2021-08-09 MED ORDER — SODIUM CHLORIDE 0.9 % IV SOLN
2.0000 g | Freq: Once | INTRAVENOUS | Status: AC
  Administered 2021-08-09: 2 g via INTRAVENOUS
  Filled 2021-08-09: qty 12.5

## 2021-08-09 MED ORDER — ENOXAPARIN SODIUM 80 MG/0.8ML IJ SOSY
70.0000 mg | PREFILLED_SYRINGE | Freq: Two times a day (BID) | INTRAMUSCULAR | Status: DC
  Administered 2021-08-09 – 2021-08-11 (×4): 70 mg via SUBCUTANEOUS
  Filled 2021-08-09 (×4): qty 0.8

## 2021-08-09 MED ORDER — LACTATED RINGERS IV BOLUS (SEPSIS)
1000.0000 mL | Freq: Once | INTRAVENOUS | Status: AC
  Administered 2021-08-09: 1000 mL via INTRAVENOUS

## 2021-08-09 MED ORDER — MIDODRINE HCL 5 MG PO TABS
5.0000 mg | ORAL_TABLET | Freq: Every day | ORAL | Status: DC
  Administered 2021-08-09 – 2021-08-12 (×4): 5 mg via ORAL
  Filled 2021-08-09 (×4): qty 1

## 2021-08-09 MED ORDER — FOLIC ACID 1 MG PO TABS
1.0000 mg | ORAL_TABLET | Freq: Every day | ORAL | Status: DC
Start: 2021-08-09 — End: 2021-08-12
  Administered 2021-08-09 – 2021-08-12 (×4): 1 mg via ORAL
  Filled 2021-08-09 (×4): qty 1

## 2021-08-09 MED ORDER — ONDANSETRON 4 MG PO TBDP
4.0000 mg | ORAL_TABLET | Freq: Four times a day (QID) | ORAL | Status: DC | PRN
  Administered 2021-08-12: 4 mg via ORAL
  Filled 2021-08-09: qty 1

## 2021-08-09 MED ORDER — TRAMADOL HCL 50 MG PO TABS
50.0000 mg | ORAL_TABLET | Freq: Four times a day (QID) | ORAL | Status: DC | PRN
  Administered 2021-08-09 – 2021-08-10 (×2): 50 mg via ORAL
  Filled 2021-08-09 (×2): qty 1

## 2021-08-09 MED ORDER — THIAMINE HCL 100 MG PO TABS
100.0000 mg | ORAL_TABLET | Freq: Every day | ORAL | Status: DC
Start: 2021-08-09 — End: 2021-08-12
  Administered 2021-08-09 – 2021-08-12 (×4): 100 mg via ORAL
  Filled 2021-08-09 (×4): qty 1

## 2021-08-09 MED ORDER — SODIUM CHLORIDE 0.9 % IV SOLN: 2.0000 g | Freq: Three times a day (TID) | INTRAVENOUS | Status: DC

## 2021-08-09 MED ORDER — VANCOMYCIN HCL 1250 MG/250ML IV SOLN: 1250.0000 mg | Freq: Once | INTRAVENOUS | Status: DC

## 2021-08-09 MED ORDER — LACTATED RINGERS IV BOLUS
1000.0000 mL | Freq: Once | INTRAVENOUS | Status: AC
  Administered 2021-08-09: 1000 mL via INTRAVENOUS

## 2021-08-09 MED ORDER — ONDANSETRON HCL 4 MG/2ML IJ SOLN
4.0000 mg | Freq: Once | INTRAMUSCULAR | Status: AC
  Administered 2021-08-09: 4 mg via INTRAVENOUS
  Filled 2021-08-09: qty 2

## 2021-08-09 MED ORDER — AMIODARONE LOAD VIA INFUSION
150.0000 mg | Freq: Once | INTRAVENOUS | Status: AC
  Administered 2021-08-09: 150 mg via INTRAVENOUS
  Filled 2021-08-09: qty 83.34

## 2021-08-09 MED ORDER — PANTOPRAZOLE SODIUM 40 MG PO TBEC
40.0000 mg | DELAYED_RELEASE_TABLET | Freq: Every day | ORAL | Status: DC
  Administered 2021-08-09 – 2021-08-12 (×4): 40 mg via ORAL
  Filled 2021-08-09 (×4): qty 1

## 2021-08-09 MED ORDER — ADULT MULTIVITAMIN W/MINERALS CH
1.0000 | ORAL_TABLET | Freq: Every day | ORAL | Status: DC
  Administered 2021-08-09 – 2021-08-12 (×4): 1 via ORAL
  Filled 2021-08-09 (×4): qty 1

## 2021-08-09 MED ORDER — POTASSIUM CHLORIDE 20 MEQ PO PACK
60.0000 meq | PACK | Freq: Once | ORAL | Status: AC
  Administered 2021-08-09: 60 meq via ORAL
  Filled 2021-08-09: qty 3

## 2021-08-09 MED ORDER — METRONIDAZOLE 500 MG/100ML IV SOLN
500.0000 mg | Freq: Once | INTRAVENOUS | Status: AC
  Administered 2021-08-09: 500 mg via INTRAVENOUS
  Filled 2021-08-09: qty 100

## 2021-08-09 MED ORDER — DIGOXIN 0.25 MG/ML IJ SOLN
0.2500 mg | Freq: Once | INTRAMUSCULAR | Status: AC
  Administered 2021-08-09: 0.25 mg via INTRAVENOUS
  Filled 2021-08-09: qty 2

## 2021-08-09 MED ORDER — PIPERACILLIN-TAZOBACTAM 3.375 G IVPB
3.3750 g | Freq: Three times a day (TID) | INTRAVENOUS | Status: DC
  Administered 2021-08-09 – 2021-08-12 (×10): 3.375 g via INTRAVENOUS
  Filled 2021-08-09 (×10): qty 50

## 2021-08-09 MED ORDER — IOHEXOL 300 MG/ML  SOLN
100.0000 mL | Freq: Once | INTRAMUSCULAR | Status: AC | PRN
  Administered 2021-08-09: 80 mL via INTRAVENOUS

## 2021-08-09 MED ORDER — SODIUM CHLORIDE 0.9 % IV BOLUS
500.0000 mL | Freq: Once | INTRAVENOUS | Status: AC
  Administered 2021-08-09: 500 mL via INTRAVENOUS

## 2021-08-09 NOTE — Assessment & Plan Note (Addendum)
--   this has been diet controlled, we'll keep a monitor on glucose from morning lab draws

## 2021-08-09 NOTE — Assessment & Plan Note (Addendum)
--   temporarily holding apixaban for now in case procedures needed  -- he is on subcutaneous enoxaparin temporarily

## 2021-08-09 NOTE — Progress Notes (Signed)
   08/09/21 1258  Vitals  Temp 98.4 F (36.9 C)  BP 97/70  MAP (mmHg) 78  BP Location Left Arm  BP Method Automatic  Pulse Rate (!) 130  Pulse Rate Source Dinamap  Level of Consciousness  Level of Consciousness Alert  Oxygen Therapy  SpO2 98 %  O2 Device Room Air  Provider Notification  Provider Name/Title Mark jenkins/ [place] Laural Benes MD  Date Provider Notified 08/09/21  Time Provider Notified 1258  Method of Notification Face-to-face  Notification Reason Other (Comment) (Red MEWS)  Provider response At bedside  Date of Provider Response 08/09/21  Time of Provider Response 1310   2 EKGs preformed and are in chart  MD Laural Benes MD was bedside

## 2021-08-09 NOTE — ED Triage Notes (Signed)
Pt reports RLQ pain that started this morning when getting up to go to the bathroom. Pt says he had a very small BM. Pt says his chest hurt earlier, but not at this time. Pt had gallbladder surgery three days ago (Thursday) Pt has nausea, but no vomiting.

## 2021-08-09 NOTE — ED Notes (Addendum)
CT notified, pt ready. Pt remains alert, NAD, calm, interactive. Denies pain or nausea at this time.

## 2021-08-09 NOTE — Assessment & Plan Note (Addendum)
--   pt is anticoagulated with apixaban which we plan to restart

## 2021-08-09 NOTE — Assessment & Plan Note (Addendum)
--   He has documents scanned into Epic for DNR status  ** Update: Pt requested to change his code status back to full code  -- pt has continued to decline with multiple recent hospitalizations and failing health, I have asked for a palliative consultation as I fear that coding him would cause this poor man undue pain and suffering with no meaningful recovery afterwards...however as he requested I have changed him back to full code status

## 2021-08-09 NOTE — Assessment & Plan Note (Addendum)
--   this has been repleted with IV magnesium

## 2021-08-09 NOTE — Hospital Course (Signed)
87  y/o male well known to medical service from recent hospitalization for sepsis, bacteremia and ascending cholangitis.  He had to be transferred to [facility] for an ERCP with sphincterotomy and stone extraction.  He has a history of upper GI bleeding and Mallory-Weiss tear that was treated with Protonix.  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  He has chronic thrombocytopenia.  He has type 2 diabetes mellitus, hypertension, history of alcoholism, GERD.  He recovered from the ascending cholangitis and subsequently followed up with surgery and had a laparoscopic cholecystectomy done on 08/06/2021.  He was discharged in good condition on 08/07/2021.  He returned to ED on 08/09/2021 with right-sided abdominal pain and fevers.  He had blood cultures drawn and he was admitted by surgery for IV antibiotics and monitoring.  He has been stable on the floor however has developed sinus tachycardia with a heart rate jumping into the 130s.  Serial EKGs have been done and he seems to be in sinus tachycardia.  Fortunately his heart rate seems to drop back down into the 70 range but it goes back up into the 120s at rest.  The patient is totally asymptomatic from the heart rate changes.  He has been restarted on his metoprolol 25 mg twice daily and had morning dose today.    I spoke with Dr. Arnoldo Morale and he requested a medical consultation.  Pt is being transferred to stepdown ICU for closer monitoring given his advanced age and co-morbidities.    08/11/2021:  Pt converted to SR, off IV amiodarone and on oral amiodarone.  Enoxaparin stopped due to low platelets.  Bilirubin normalized. Per surgery ok to restart oral anticoagulation.

## 2021-08-09 NOTE — ED Provider Notes (Signed)
[facility] Provider Note   CSN: [id number] Arrival date & time: 08/09/21  [id number]     History  Chief Complaint  Patient presents with   Abdominal Pain    Gallbladder surgery 3 days ago    Jon Bennett is a 87 y.o. male.   Abdominal Pain Associated symptoms: chills and nausea   Patient presenting for abdominal pain.  Onset was this morning.  His medical history includes HTN, GERD, T2DM, splenic vein thrombosis, pancreatitis, and CVA.  3 days ago, he underwent laparoscopic cholecystectomy due to cholelithiasis.  He was discharged from the hospital 2 days ago.  He has had 1 small bowel movement since his surgery.  Current abdominal pain is right lower quadrant in location.  He endorses nausea and chills.  He has not vomited.  He denies any other associated symptoms.    Home Medications Prior to Admission medications   Medication Sig Start Date End Date Taking? Authorizing Provider  acamprosate (CAMPRAL) 333 MG tablet Take 666 mg by mouth 3 (three) times daily. 07/12/21   [provider]  acetaminophen (TYLENOL) 500 MG tablet Take 500 mg by mouth daily as needed for mild pain or moderate pain.    [provider]  bisacodyl (DULCOLAX) 10 MG suppository If not relieved by MOM, give 10 mg Bisacodyl suppositiory rectally X 1 dose in 24 hours as needed    [provider]  diclofenac Sodium (VOLTAREN) 1 % GEL APPLY 4 GRAMS TOPICALLY TO LEFT KNEE TWICE A DAY FOR ARTHRITIS    [provider]  ELIQUIS 5 MG TABS tablet TAKE 1 TABLET BY MOUTH TWICE A DAY 05/20/21   Doreatha MassedKatragadda, Sreedhar, MD  feeding supplement (ENSURE ENLIVE / ENSURE PLUS) LIQD Take 237 mLs by mouth 3 (three) times daily between meals. 07/08/21   Ghimire, Werner LeanShanker M, MD  folic acid (FOLVITE) 1 MG tablet Take 1 tablet (1 mg total) by mouth daily. 07/09/21   Ghimire, Werner LeanShanker M, MD  Magnesium Hydroxide (MILK OF MAGNESIA PO) If no BM in 3 days, give 30 cc Milk of Magnesium p.o. x 1  dose in 24 hours as needed    [provider]  meloxicam (MOBIC) 7.5 MG tablet Take 7.5 mg by mouth daily.    [provider]  metoprolol tartrate (LOPRESSOR) 25 MG tablet Take 25 mg by mouth 2 (two) times daily.    [provider]  midodrine (PROAMATINE) 5 MG tablet Take 5 mg by mouth daily. AS NEEDED FOR SBP</=100    [provider]  Multiple Vitamin (MULTIVITAMIN WITH MINERALS) TABS tablet Take 1 tablet by mouth daily. 07/09/21   Ghimire, Werner LeanShanker M, MD  pantoprazole (PROTONIX) 40 MG tablet Take 1 tablet (40 mg total) by mouth 2 (two) times daily. 07/08/21   Ghimire, Werner LeanShanker M, MD  Sodium Phosphates (RA SALINE ENEMA RE) If not relieved by Biscodyl suppository, give disposable Saline Enema rectally X 1 dose/24 hrs as needed    [provider]  tamsulosin (FLOMAX) 0.4 MG CAPS capsule Take 0.4 mg by mouth daily. 06/09/21   [provider]  thiamine 100 MG tablet Take 1 tablet (100 mg total) by mouth daily. 08/28/20   Sherryll BurgerShah, Pratik D, DO      Allergies    Asa [aspirin]    Review of Systems   Review of Systems  Constitutional:  Positive for chills.  Gastrointestinal:  Positive for abdominal pain and nausea.  All other systems reviewed and are negative.  Physical  Exam Updated Vital Signs BP 112/66 (BP Location: Left Arm)   Pulse (!) 110   Temp 99.1 F (37.3 C) (Oral)   Resp (!) 26   Ht 5\' 7"  (1.702 m)   Wt 64.4 kg   SpO2 91%   BMI 22.24 kg/m  Physical Exam Vitals and nursing note reviewed.  Constitutional:      General: He is not in acute distress.    Appearance: He is well-developed and normal weight. He is ill-appearing. He is not toxic-appearing or diaphoretic.  HENT:     Head: Normocephalic and atraumatic.     Mouth/Throat:     Mouth: Mucous membranes are moist.     Pharynx: Oropharynx is clear.  Eyes:     Extraocular Movements: Extraocular movements intact.     Conjunctiva/sclera: Conjunctivae normal.  Cardiovascular:      Rate and Rhythm: Regular rhythm. Tachycardia present.     Heart sounds: No murmur heard. Pulmonary:     Effort: Pulmonary effort is normal. No respiratory distress.     Breath sounds: Normal breath sounds. No wheezing or rales.  Chest:     Chest wall: No tenderness.  Abdominal:     General: There is no distension.     Palpations: Abdomen is soft.     Tenderness: There is abdominal tenderness in the right lower quadrant. There is guarding.     Comments: Laparoscopic surgical scars in place with overlying Dermabond  Musculoskeletal:        General: No swelling.     Cervical back: Neck supple.  Skin:    General: Skin is warm and dry.     Capillary Refill: Capillary refill takes less than 2 seconds.  Neurological:     General: No focal deficit present.     Mental Status: He is alert and oriented to person, place, and time.  Psychiatric:        Mood and Affect: Mood normal.        Behavior: Behavior normal.    ED Results / Procedures / Treatments   Labs (all labs ordered are listed, but only abnormal results are displayed) Labs Reviewed  COMPREHENSIVE METABOLIC PANEL - Abnormal; Notable for the following components:      Result Value   Potassium 3.4 (*)    Glucose, Bld 166 (*)    Calcium 8.8 (*)    Albumin 3.4 (*)    Total Bilirubin 1.7 (*)    All other components within normal limits  LACTIC ACID, PLASMA - Abnormal; Notable for the following components:   Lactic Acid, Venous 3.3 (*)    All other components within normal limits  CBC WITH DIFFERENTIAL/PLATELET - Abnormal; Notable for the following components:   HCT 38.8 (*)    Platelets 145 (*)    All other components within normal limits  MAGNESIUM - Abnormal; Notable for the following components:   Magnesium 1.6 (*)    All other components within normal limits  I-STAT CHEM 8, ED - Abnormal; Notable for the following components:   Potassium 3.3 (*)    Creatinine, Ser 0.60 (*)    Glucose, Bld 141 (*)    Calcium, Ion 1.13  (*)    Hemoglobin 10.9 (*)    HCT 32.0 (*)    All other components within normal limits  CULTURE, BLOOD (ROUTINE X 2)  CULTURE, BLOOD (ROUTINE X 2)  SARS CORONAVIRUS 2 BY RT PCR  LIPASE, BLOOD  PROTIME-INR  LACTIC ACID, PLASMA  LACTIC ACID, PLASMA  TROPONIN I (HIGH SENSITIVITY)  TROPONIN I (HIGH SENSITIVITY)    EKG EKG Interpretation  Date/Time:  Sunday Aug 09 2021 07:02:15 EDT Ventricular Rate:  139 PR Interval:  131 QRS Duration: 88 QT Interval:  256 QTC Calculation: 390 R Axis:   -38 Text Interpretation: Sinus tachycardia Atrial premature complex Probable left atrial enlargement Left axis deviation Anteroseptal infarct, old ST depression, probably rate related ST elevation, consider inferior injury Artifact in lead(s) I II III aVR aVL Confirmed by Gloris Manchester [phone]) on 08/09/2021 7:51:58 AM  Radiology CT ABDOMEN PELVIS W CONTRAST  Result Date: 08/09/2021 CLINICAL DATA:  Right lower quadrant abdominal pain. EXAM: CT ABDOMEN AND PELVIS WITH CONTRAST TECHNIQUE: Multidetector CT imaging of the abdomen and pelvis was performed using the standard protocol following bolus administration of intravenous contrast. RADIATION DOSE REDUCTION: This exam was performed according to the departmental dose-optimization program which includes automated exposure control, adjustment of the mA and/or kV according to patient size and/or use of iterative reconstruction technique. CONTRAST:  65mL OMNIPAQUE IOHEXOL 300 MG/ML  SOLN COMPARISON:  06/24/2021 FINDINGS: Lower chest: Atelectasis is identified within scratch set there is subsegmental atelectasis within the dependent portion of the lung bases. Hepatobiliary: No focal liver abnormality. Status post cholecystectomy. Postop collection within the gallbladder fossa is nonspecific measuring 7.5 3.9 cm in contains amorphous low-attenuation material mixed with gas, image 28/2. No significant bile duct dilatation. Pancreas: Diffuse pancreatic atrophy is again  noted. No main duct dilatation or inflammation identified. Spleen: Normal in size without focal abnormality. Adrenals/Urinary Tract: Normal adrenal glands. 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 No follow-up recommended. No signs of nephrolithiasis or hydronephrosis. Urinary bladder appears normal. Stomach/Bowel: Moderate to large hiatal hernia. 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 Status post appendectomy. No bowel wall thickening, inflammation, or distension. Vascular/Lymphatic: Aortic atherosclerosis without aneurysm. No signs of abdominopelvic adenopathy. Reproductive: Fiducial markers identified within the prostate gland. Other: There is trace amount of free fluid identified within the dependent portion of the pelvis. Small volume of pneumoperitoneum is noted within the upper abdomen, image 17/2. This is compatible with the early postoperative state. Musculoskeletal: No acute or significant osseous findings. IMPRESSION: 1. Status post cholecystectomy with small volume of pneumoperitoneum compatible with the early postoperative state. 2. Nonspecific postop fluid and gas collection within the gallbladder fossa is noted. Is favored to represent a postoperative seroma and/or Surgicel. 3. Trace free fluid noted within the dependent portion of the pelvis. No specific findings identified to suggest abscess. 4. Status post appendectomy. 5. Hiatal hernia. 6. Aortic Atherosclerosis (ICD10-I70.0). Electronically Signed   By: Signa Kell M.D.   On: 08/09/2021 09:33   DG Chest Port 1 View  Result Date: 08/09/2021 CLINICAL DATA:  Chest pain EXAM: PORTABLE CHEST 1 VIEW COMPARISON:  07/02/2021 and prior studies FINDINGS: The cardiomediastinal silhouette is unchanged. Surgical clips overlying the LEFT hemithorax again noted. There is no evidence of focal airspace disease, pulmonary edema, suspicious pulmonary nodule/mass, pleural effusion, or pneumothorax. No acute bony abnormalities  are identified. Remote rib fractures are again identified. IMPRESSION: No active disease. Electronically Signed   By: Harmon Pier M.D.   On: 08/09/2021 07:25    Procedures Procedures    Medications Ordered in ED Medications  metoprolol tartrate (LOPRESSOR) tablet 25 mg (has no administration in time range)  midodrine (PROAMATINE) tablet 5 mg (has no administration in time range)  tamsulosin (FLOMAX) capsule 0.4 mg (has no administration in time range)  folic acid (FOLVITE)  tablet 1 mg (has no administration in time range)  feeding supplement (ENSURE ENLIVE / ENSURE PLUS) liquid 237 mL (has no administration in time range)  multivitamin with minerals tablet 1 tablet (has no administration in time range)  thiamine tablet 100 mg (has no administration in time range)  lactated ringers infusion (has no administration in time range)  piperacillin-tazobactam (ZOSYN) IVPB 3.375 g (has no administration in time range)  acetaminophen (TYLENOL) tablet 650 mg (has no administration in time range)    Or  acetaminophen (TYLENOL) suppository 650 mg (has no administration in time range)  traMADol (ULTRAM) tablet 50 mg (has no administration in time range)  morphine (PF) 2 MG/ML injection 2 mg (has no administration in time range)  ondansetron (ZOFRAN-ODT) disintegrating tablet 4 mg (has no administration in time range)    Or  ondansetron (ZOFRAN) injection 4 mg (has no administration in time range)  simethicone (MYLICON) chewable tablet 40 mg (has no administration in time range)  pantoprazole (PROTONIX) EC tablet 40 mg (has no administration in time range)  LORazepam (ATIVAN) injection 0.5 mg (has no administration in time range)  lactated ringers bolus 1,000 mL (0 mLs Intravenous Stopped 08/09/21 0807)  HYDROmorphone (DILAUDID) injection 0.5 mg (0.5 mg Intravenous Given 08/09/21 0726)  ondansetron (ZOFRAN) injection 4 mg (4 mg Intravenous Given 08/09/21 0726)  acetaminophen (TYLENOL) suppository 650 mg  (650 mg Rectal Given 08/09/21 0738)  lactated ringers bolus 1,000 mL (0 mLs Intravenous Stopped 08/09/21 0904)  ceFEPIme (MAXIPIME) 2 g in sodium chloride 0.9 % 100 mL IVPB (0 g Intravenous Stopped 08/09/21 0934)  metroNIDAZOLE (FLAGYL) IVPB 500 mg (0 mg Intravenous Stopped 08/09/21 0919)  magnesium sulfate IVPB 2 g 50 mL (0 g Intravenous Stopped 08/09/21 0933)  iohexol (OMNIPAQUE) 300 MG/ML solution 100 mL (80 mLs Intravenous Contrast Given 08/09/21 0908)  lactated ringers bolus 1,000 mL (1,000 mLs Intravenous New Bag/Given 08/09/21 1610)    ED Course/ Medical Decision Making/ A&P                           Medical Decision Making Amount and/or Complexity of Data Reviewed Labs: ordered. Radiology: ordered. ECG/medicine tests: ordered.  Risk OTC drugs. Prescription drug management. Decision regarding hospitalization.   This patient presents to the ED for concern of right lower quadrant abdominal pain, chills, nausea, this involves an extensive number of treatment options, and is a complaint that carries with it a high risk of complications and morbidity.  The differential diagnosis includes postoperative complication, viral illness, appendicitis, colitis, pancreatitis, pneumonia   Co morbidities that complicate the patient evaluation  HTN, GERD, T2DM, splenic vein thrombosis, pancreatitis, and CVA, and recent laparoscopic cholecystectomy   Additional history obtained:  Additional history obtained from N/A External records from outside source obtained and reviewed including EMR   Lab Tests:  I Ordered, and personally interpreted labs.  The pertinent results include: Total bilirubin is recently increased, hepatobiliary enzymes otherwise normal; no leukocytosis is present; magnesium and potassium are slightly low but electrolytes are otherwise normal; lactic acidosis is present   Imaging Studies ordered:  I ordered imaging studies including chest x-ray, CT of abdomen and pelvis I  independently visualized and interpreted imaging which showed no acute findings on chest x-ray; CT scan shows expected postoperative findings in gallbladder fossa and a trace amount of dependent free fluid in pelvis I agree with the radiologist interpretation   Cardiac Monitoring: / EKG:  The patient was maintained on  a cardiac monitor.  I personally viewed and interpreted the cardiac monitored which showed an underlying rhythm of: Sinus rhythm   Consultations Obtained:  I requested consultation with the general surgeon, Dr. Lovell Sheehan,  and discussed lab and imaging findings as well as pertinent plan - they recommend: Dr. Lovell Sheehan to evaluate patient in the ED.   Problem List / ED Course / Critical interventions / Medication management  Patient is 87 year old male presenting from home for right lower quadrant abdominal pain.  Onset was this morning.  He has experienced nausea and chills.  He has not had any vomiting.  He has had 1 bowel movement since his recent surgery (laparoscopic cholecystectomy 3 days ago).  He was discharged from the hospital 2 days ago.  On arrival, patient is febrile, tachycardic, and tachypneic.  He is ill-appearing.  Bolus of IV fluids was ordered.  Patient was given Dilaudid and Zofran for symptomatic relief.  Laboratory work-up was initiated.  On rectal temperature, patient found to be febrile.  Patient meets sepsis criteria.  Additional IV fluids and broad-spectrum antibiotics were ordered.  Early labs are notable for lactic acidosis and hypomagnesemia.  Replacement magnesium was ordered.  Interestingly, patient does not have a leukocytosis.  Chest x-ray shows no evidence of pneumonia.  He underwent CT imaging of his abdomen and pelvis which showed expected postoperative change in gallbladder fossa and trace dependent fluid in pelvis without any clear evidence of intra-abdominal infection.  On reassessment, patient has improved tachycardia.  He reports significantly  improved pain and resolution of nausea.  I did speak with general surgeon, Dr. Lovell Sheehan who graciously came and evaluated the patient in the ED.  Dr. Lovell Sheehan reviewed CT imaging and agrees that there is any clear source of an intra-abdominal infection.  He did admit the patient to his service for continued observation and repeat lab work. I ordered medication including IV fluids and broad-spectrum antibiotics for empiric treatment of sepsis; Tylenol for antipyresis; Dilaudid for analgesia; Zofran for nausea; magnesium sulfate for hypomagnesemia Reevaluation of the patient after these medicines showed that the patient improved I have reviewed the patients home medicines and have made adjustments as needed   Social Determinants of Health:  Has access to outpatient care  CRITICAL CARE Performed by: Gloris Manchester   Total critical care time: 35 minutes  Critical care time was exclusive of separately billable procedures and treating other patients.  Critical care was necessary to treat or prevent imminent or life-threatening deterioration.  Critical care was time spent personally by me on the following activities: development of treatment plan with patient and/or surrogate as well as nursing, discussions with consultants, evaluation of patient's response to treatment, examination of patient, obtaining history from patient or surrogate, ordering and performing treatments and interventions, ordering and review of laboratory studies, ordering and review of radiographic studies, pulse oximetry and re-evaluation of patient's condition.         Final Clinical Impression(s) / ED Diagnoses Final diagnoses:  Right lower quadrant abdominal pain  Tachycardia  Lactic acidosis    Rx / DC Orders ED Discharge Orders     None         Gloris Manchester, MD 08/09/21 1006

## 2021-08-09 NOTE — Progress Notes (Signed)
[facility] following for sepsis protocol. 

## 2021-08-09 NOTE — H&P (Signed)
Jon Bennett is an 87 y.o. male.   Chief Complaint: Abdominal pain, fever HPI: Patient is an 87 year old white male status post laparoscopic cholecystectomy on 08/06/2021, discharged 08/07/2021 who presents with right-sided abdominal pain and fevers.  Patient states that he has been eating but was sent here by EMS for further evaluation.  Patient is a poor historian.  I tried to contact the patient's son but there was no answer.  Past Medical History:  Diagnosis Date   Diabetes mellitus without complication ([facility])    Patient is not on any medication for this   GERD (gastroesophageal reflux disease)    Hypertension    Pancreatitis, alcoholic, acute    Splenic vein thrombosis    Stroke [facility])    mini strokes    Past Surgical History:  Procedure Laterality Date   APPENDECTOMY     CAROTID ANGIOGRAPHY     CHOLECYSTECTOMY N/A 08/06/2021   Procedure: LAPAROSCOPIC CHOLECYSTECTOMY;  Surgeon: Aviva Signs, MD;  Location: [facility] ORS;  Service: General;  Laterality: N/A;   ERCP N/A 07/03/2021   Procedure: ENDOSCOPIC RETROGRADE CHOLANGIOPANCREATOGRAPHY (ERCP);  Surgeon: Jackquline Denmark, MD;  Location: [facility] ENDOSCOPY;  Service: Gastroenterology;  Laterality: N/A;   esophageal tear repair     ESOPHAGOGASTRODUODENOSCOPY (EGD) WITH PROPOFOL N/A 06/25/2021   Procedure: ESOPHAGOGASTRODUODENOSCOPY (EGD) WITH PROPOFOL;  Surgeon: Rogene Houston, MD;  Location: [facility] ENDO SUITE;  Service: Endoscopy;  Laterality: N/A;   REMOVAL OF STONES  07/03/2021   Procedure: REMOVAL OF STONES;  Surgeon: Jackquline Denmark, MD;  Location: [facility] ENDOSCOPY;  Service: Gastroenterology;;   Jon Bennett  07/03/2021   Procedure: Jon Bennett;  Surgeon: Jackquline Denmark, MD;  Location: [facility] ENDOSCOPY;  Service: Gastroenterology;;   VASCULAR SURGERY      Family History  Problem Relation Age of Onset   Hypertension Other    Heart disease Father    Healthy Sister    Healthy Brother    Cancer Maternal Grandfather    Healthy Brother    Healthy  Brother    Healthy Sister    Healthy Sister    Healthy Sister    Healthy Brother    Healthy Son    Healthy Son    Healthy Son    Healthy Son    Social History:  reports that he has never smoked. He has never used smokeless tobacco. He reports that he does not currently use alcohol after a past usage of about 1.0 standard drink per week. He reports that he does not use drugs.  Allergies:  Allergies  Allergen Reactions   Asa [Aspirin] Shortness Of Breath    (Not in a hospital admission)   Results for orders placed or performed during the hospital encounter of 08/09/21 (from the past 48 hour(s))  Comprehensive metabolic panel     Status: Abnormal   Collection Time: 08/09/21  7:19 AM  Result Value Ref Range   Sodium 139 135 - 145 mmol/L   Potassium 3.4 (L) 3.5 - 5.1 mmol/L   Chloride 104 98 - 111 mmol/L   CO2 26 22 - 32 mmol/L   Glucose, Bld 166 (H) 70 - 99 mg/dL    Comment: Glucose reference range applies only to samples taken after fasting for at least 8 hours.   BUN 15 8 - 23 mg/dL   Creatinine, Ser 0.77 0.61 - 1.24 mg/dL   Calcium 8.8 (L) 8.9 - 10.3 mg/dL   Total Protein 6.9 6.5 - 8.1 g/dL   Albumin 3.4 (L) 3.5 - 5.0 g/dL  AST 23 15 - 41 U/L   ALT 13 0 - 44 U/L   Alkaline Phosphatase 88 38 - 126 U/L   Total Bilirubin 1.7 (H) 0.3 - 1.2 mg/dL   GFR, Estimated >60 >60 mL/min    Comment: (NOTE) Calculated using the CKD-EPI Creatinine Equation (2021)    Anion gap 9 5 - 15    Comment: Performed at [facility], [address], [place], [place] [address]  Lipase, blood     Status: None   Collection Time: 08/09/21  7:19 AM  Result Value Ref Range   Lipase 21 11 - 51 U/L    Comment: Performed at [facility], [address], [place], [place] [address]  Troponin I (High Sensitivity)     Status: None   Collection Time: 08/09/21  7:19 AM  Result Value Ref Range   Troponin I (High Sensitivity) 16 <18 ng/L    Comment: (NOTE) Elevated high sensitivity troponin I (hsTnI) values  and significant  changes across serial measurements may suggest ACS but many other  chronic and acute conditions are known to elevate hsTnI results.  Refer to the "Links" section for chest pain algorithms and additional  guidance. Performed at [facility], [address], [place], [place] [address]   Protime-INR     Status: None   Collection Time: 08/09/21  7:19 AM  Result Value Ref Range   Prothrombin Time 14.0 11.4 - 15.2 seconds   INR 1.1 0.8 - 1.2    Comment: (NOTE) INR goal varies based on device and disease states. Performed at [facility], [address], [place], [place] [address]   Lactic acid, plasma     Status: Abnormal   Collection Time: 08/09/21  7:21 AM  Result Value Ref Range   Lactic Acid, Venous 3.3 (HH) 0.5 - 1.9 mmol/L    Comment: CRITICAL RESULT CALLED TO, READ BACK BY AND VERIFIED WITH: A.PETERMAN @ 0759 BY STEPHTR Performed at [facility], [address], [place], [place] [address]   CBC with Differential     Status: Abnormal   Collection Time: 08/09/21  7:21 AM  Result Value Ref Range   WBC 5.3 4.0 - 10.5 K/uL   RBC 4.25 4.22 - 5.81 MIL/uL   Hemoglobin 13.2 13.0 - 17.0 g/dL   HCT 38.8 (L) 39.0 - 52.0 %   MCV 91.3 80.0 - 100.0 fL   MCH 31.1 26.0 - 34.0 pg   MCHC 34.0 30.0 - 36.0 g/dL   RDW 13.8 11.5 - 15.5 %   Platelets 145 (L) 150 - 400 K/uL   nRBC 0.0 0.0 - 0.2 %   Neutrophils Relative % 83 %   Neutro Abs 4.3 1.7 - 7.7 K/uL   Lymphocytes Relative 13 %   Lymphs Abs 0.7 0.7 - 4.0 K/uL   Monocytes Relative 4 %   Monocytes Absolute 0.2 0.1 - 1.0 K/uL   Eosinophils Relative 0 %   Eosinophils Absolute 0.0 0.0 - 0.5 K/uL   Basophils Relative 0 %   Basophils Absolute 0.0 0.0 - 0.1 K/uL   Immature Granulocytes 0 %   Abs Immature Granulocytes 0.01 0.00 - 0.07 K/uL    Comment: Performed at [facility], [address] [place], [place] [address]  Blood Culture (routine x 2)     Status: None (Preliminary result)   Collection Time: 08/09/21  7:21 AM    Specimen: Right Antecubital; Blood  Result Value Ref Range   Specimen Description  RIGHT ANTECUBITAL BOTTLES DRAWN AEROBIC AND ANAEROBIC   Special Requests      Blood Culture adequate volume Performed at [facility], [address], [place], [place] [address]    Culture PENDING    Report Status PENDING   Magnesium     Status: Abnormal   Collection Time: 08/09/21  7:21 AM  Result Value Ref Range   Magnesium 1.6 (L) 1.7 - 2.4 mg/dL    Comment: Performed at [facility], [address], [place], [place] [address]  Blood Culture (routine x 2)     Status: None (Preliminary result)   Collection Time: 08/09/21  7:57 AM   Specimen: BLOOD LEFT WRIST  Result Value Ref Range   Specimen Description      BLOOD LEFT WRIST BOTTLES DRAWN AEROBIC AND ANAEROBIC   Special Requests      Blood Culture adequate volume Performed at [facility], [address] [place],  [address]    Culture PENDING    Report Status PENDING   I-stat chem 8, ED     Status: Abnormal   Collection Time: 08/09/21  8:11 AM  Result Value Ref Range   Sodium 138 135 - 145 mmol/L   Potassium 3.3 (L) 3.5 - 5.1 mmol/L   Chloride 103 98 - 111 mmol/L   BUN 15 8 - 23 mg/dL   Creatinine, Ser 0.60 (L) 0.61 - 1.24 mg/dL   Glucose, Bld 141 (H) 70 - 99 mg/dL    Comment: Glucose reference range applies only to samples taken after fasting for at least 8 hours.   Calcium, Ion 1.13 (L) 1.15 - 1.40 mmol/L   TCO2 25 22 - 32 mmol/L   Hemoglobin 10.9 (L) 13.0 - 17.0 g/dL   HCT 32.0 (L) 39.0 - 52.0 %   CT ABDOMEN PELVIS W CONTRAST  Result Date: 08/09/2021 CLINICAL DATA:  Right lower quadrant abdominal pain. EXAM: CT ABDOMEN AND PELVIS WITH CONTRAST TECHNIQUE: Multidetector CT imaging of the abdomen and pelvis was performed using the standard protocol following bolus administration of intravenous contrast. RADIATION DOSE REDUCTION: This exam was performed according to the departmental dose-optimization program which includes  automated exposure control, adjustment of the mA and/or kV according to patient size and/or use of iterative reconstruction technique. CONTRAST:  15mL OMNIPAQUE IOHEXOL 300 MG/ML  SOLN COMPARISON:  06/24/2021 FINDINGS: Lower chest: Atelectasis is identified within scratch set there is subsegmental atelectasis within the dependent portion of the lung bases. Hepatobiliary: No focal liver abnormality. Status post cholecystectomy. Postop collection within the gallbladder fossa is nonspecific measuring 7.5 3.9 cm in contains amorphous low-attenuation material mixed with gas, image 28/2. No significant bile duct dilatation. Pancreas: Diffuse pancreatic atrophy is again noted. No main duct dilatation or inflammation identified. Spleen: Normal in size without focal abnormality. Adrenals/Urinary Tract: Normal adrenal glands. 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 No follow-up recommended. No signs of nephrolithiasis or hydronephrosis. Urinary bladder appears normal. Stomach/Bowel: Moderate to large hiatal hernia. 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 Status post appendectomy. No bowel wall thickening, inflammation, or distension. Vascular/Lymphatic: Aortic atherosclerosis without aneurysm. No signs of abdominopelvic adenopathy. Reproductive: Fiducial markers identified within the prostate gland. Other: There is trace amount of free fluid identified within the dependent portion of the pelvis. Small volume of pneumoperitoneum is noted within the upper abdomen, image 17/2. This is compatible with the early postoperative state. Musculoskeletal: No acute or significant osseous findings. IMPRESSION: 1. Status post cholecystectomy with small volume of pneumoperitoneum  compatible with the early postoperative state. 2. Nonspecific postop fluid and gas collection within the gallbladder fossa is noted. Is favored to represent a postoperative seroma and/or Surgicel. 3. Trace free fluid noted within  the dependent portion of the pelvis. No specific findings identified to suggest abscess. 4. Status post appendectomy. 5. Hiatal hernia. 6. Aortic Atherosclerosis (ICD10-I70.0). Electronically Signed   By: Kerby Moors M.D.   On: 08/09/2021 09:33   DG Chest Port 1 View  Result Date: 08/09/2021 CLINICAL DATA:  Chest pain EXAM: PORTABLE CHEST 1 VIEW COMPARISON:  07/02/2021 and prior studies FINDINGS: The cardiomediastinal silhouette is unchanged. Surgical clips overlying the LEFT hemithorax again noted. There is no evidence of focal airspace disease, pulmonary edema, suspicious pulmonary nodule/mass, pleural effusion, or pneumothorax. No acute bony abnormalities are identified. Remote rib fractures are again identified. IMPRESSION: No active disease. Electronically Signed   By: Margarette Canada M.D.   On: 08/09/2021 07:25    Review of Systems  Unable to perform ROS: Dementia   Blood pressure 112/66, pulse (!) 110, temperature 99.1 F (37.3 C), temperature source Oral, resp. rate (!) 26, height 5\' 7"  (1.702 m), weight 64.4 kg, SpO2 91 %. Physical Exam Vitals reviewed.  Constitutional:      Appearance: He is well-developed and normal weight. He is not ill-appearing.  HENT:     Head: Normocephalic and atraumatic.  Eyes:     General: No scleral icterus. Cardiovascular:     Rate and Rhythm: Normal rate and regular rhythm.     Heart sounds: No murmur heard.   No friction rub. No gallop.  Pulmonary:     Effort: Pulmonary effort is normal. No respiratory distress.     Breath sounds: Normal breath sounds. No stridor. No wheezing, rhonchi or rales.  Abdominal:     General: Abdomen is flat. Bowel sounds are normal. There is no distension.     Palpations: Abdomen is soft. There is no hepatomegaly.     Tenderness: There is abdominal tenderness.     Hernia: No hernia is present.     Comments: Bruising noted in the right upper quadrant around his incisions.  Incisions are healing well.  No distention  noted.  No rigidity noted.  Skin:    General: Skin is warm and dry.  Neurological:     Mental Status: He is alert and oriented to person, place, and time.  CT images personally reviewed, formal report pending  Assessment/Plan Impression: Abdominal pain and fever, status post laparoscopic cholecystectomy.  There is fluid and air in the gallbladder fossa, but he is only 3 days postop and there is Surgicel present.  No biloma around the liver is noted.  No other significant free fluid is noted.  His liver enzyme tests are remarkable only for an elevated total bilirubin. Plan: Will admit for IV antibiotics and IV hydration.  We will repeat LFTs in the morning.  Patient may need HIDA scan to rule out bile leak if he does not improve.  He also may be passing a stone in the common bile duct as he does have a history of cholangitis, status post ERCP with stone extraction.  Will hold Eliquis at this point.  Aviva Signs, MD 08/09/2021, 9:40 AM

## 2021-08-09 NOTE — Assessment & Plan Note (Addendum)
--   pain seems to be improving today, tolerating diet.  See plan per surgery.   -- his bilirubin is back down to normal limits.

## 2021-08-09 NOTE — ED Notes (Signed)
Back from CT. Lab at [facility].

## 2021-08-09 NOTE — Assessment & Plan Note (Addendum)
--   holding parameters to metoprolol added -- bolused IV fluids with improvement

## 2021-08-09 NOTE — Assessment & Plan Note (Signed)
--   hg trended down a bit today likely from IV fluids.  -- no signs of bleeding found -- follow with CBC in AM

## 2021-08-09 NOTE — ED Notes (Signed)
Pt to CT at this time.

## 2021-08-09 NOTE — Progress Notes (Signed)
Jon Bennett, Jon for LR bolus to be given. Patient also bladder scanned as he has received multiple liters of fluids and not urinated much. Bladder scan showed only 250 mL and pt asymptomatic. Will continue to monitor.

## 2021-08-09 NOTE — Progress Notes (Signed)
12-Lead EKG performed, A-fib with RVR shown. BP also soft. Dr. Lovell Sheehan and Dr. Laural Benes made aware.

## 2021-08-09 NOTE — Consult Note (Addendum)
MEDICAL CONSULTATION  NOTE    REQUESTING PROVIDER: Arnoldo Morale, MD  REASON FOR CONSULT: Evaluation and management recommendations for tachycardia, hypotension  Jon Bennett  [id number] DOB: [birth year] DOA: 08/09/2021 PCP: Neale Burly, MD   Chief Complaint  Patient presents with   Abdominal Pain    Gallbladder surgery 3 days ago   Level of care: Stepdown  Brief Admission History:  87  y/o male well known to medical service from recent hospitalization for sepsis, bacteremia and ascending cholangitis.  He had to be transferred to [facility] for an ERCP with sphincterotomy and stone extraction.  He has a history of upper GI bleeding and Mallory-Weiss tear that was treated with Protonix.  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  He has chronic thrombocytopenia.  He has type 2 diabetes mellitus, hypertension, history of alcoholism, GERD.  He recovered from the ascending cholangitis and subsequently followed up with surgery and had a laparoscopic cholecystectomy done on 08/06/2021.  He was discharged in good condition on 08/07/2021.  He returned to ED on 08/09/2021 with right-sided abdominal pain and fevers.  He had blood cultures drawn and he was admitted by surgery for IV antibiotics and monitoring.  He has been stable on the floor however has developed sinus tachycardia with a heart rate jumping into the 130s.  Serial EKGs have been done and he seems to be in sinus tachycardia.  Fortunately his heart rate seems to drop back down into the 70 range but it goes back up into the 120s at rest.  The patient is totally asymptomatic from the heart rate changes.  He has been restarted on his metoprolol 25 mg twice daily and had morning dose today.    I spoke with Dr. Arnoldo Morale and he requested a medical consultation.  Pt is being transferred to stepdown ICU for closer monitoring given his advanced  age and co-morbidities.     Assessment and Plan and Recommendations:  * Postoperative abdominal pain -- pain seems to be improving some today, tolerating diet.  See plan per surgery.    Sinus tachycardia -- no findings of atrial fibrillation seen -- continue metoprolol 25 mg BID, with holding parameters added  -- continue supportive measures as ordered  -- transfer to stepdown ICU to monitor overnight for decompensation  -- monitor for s/s of infection.. Blood cultures x 2 taken at admission  -- check TSH/Free T4 -- give additional Mg and potassium; recheck in AM   DNR (do not resuscitate) -- He has documents scanned into Epic for DNR status   History of alcohol abuse -- reports that he hasn't had a drink in months   Normocytic anemia -- hg is stable from recent discharge after lap chole -- follow with CBC in AM  Protein calorie malnutrition ([place]) -- continue heart healthy diet and supplements   Pressure injury of skin -- continue wound care protocol per nursing    Hypokalemia  -- replace potassium, check magnesium and replace as needed   Chronic anticoagulation -- temporarily holding apixaban for now in case procedures needed   Hypomagnesemia -- 2 gm IV given x 1 this morning -- give additional 2 gm IV now  -- repeat Mg in AM   Splenic vein thrombosis -- pt is anticoagulated with apixaban (currently on hold)   GERD (gastroesophageal reflux disease) -- protonix for GI protection ordered  Hypertension -- restarted home metoprolol  25 mg BID   Type 2 diabetes mellitus ([place]) -- this has been diet controlled, we'll keep a monitor on glucose from morning lab draws   Thrombocytopenia (HCC) -- platelets improved some from recent testing, now up to 145 -- he has history of chronic alcoholism and subsequent liver injury    DVT prophylaxis: SCDs Code Status: DNR  Family Communication: t/c to son   Antimicrobials:  Zosyn IV 5/28>>   Subjective: Pt says he  feels fine, no palpitations, no chest pain, no SOB, abdominal pain a little better   Objective: Vitals:   08/09/21 1258 08/09/21 1300 08/09/21 1324 08/09/21 1353  BP: 97/70  90/60   Pulse: (!) 130  68 (!) 123  Resp:   20   Temp: 98.4 F (36.9 C) 98.4 F (36.9 C)    TempSrc:      SpO2: 98%  98%   Weight:      Height:        Intake/Output Summary (Last 24 hours) at 08/09/2021 1456 Last data filed at 08/09/2021 1429 Gross per 24 hour  Intake 3570 ml  Output 250 ml  Net 3320 ml   Filed Weights   08/09/21 0707  Weight: 64.4 kg   Examination:  General exam: very frail,  pale, elderly male, awake, alert, cooperative, pleasant, mentally oriented x 3, Appears calm and comfortable  Respiratory system: Clear to auscultation. Respiratory effort normal. Cardiovascular system: normal S1 & S2 heard. Tachycardic rate, No JVD, murmurs, rubs, gallops or clicks. No pedal edema. Gastrointestinal system: Abdomen is nondistended, soft and mild epigastric tenderness, wounds look well healed, no s/s of infection seen. No organomegaly or masses felt. Normal bowel sounds heard. Central nervous system: Alert and oriented. No focal neurological deficits. Extremities: Symmetric 5 x 5 power. Skin: No rashes, lesions or ulcers. Psychiatry: Judgement and insight appear normal. Mood & affect appropriate.   Data Reviewed: I have personally reviewed following labs and imaging studies  CBC: Recent Labs  Lab 08/07/21 0546 08/07/21 0932 08/09/21 0721 08/09/21 0811  WBC 9.8 13.2* 5.3  --   NEUTROABS  --   --  4.3  --   HGB 10.8* 12.3* 13.2 10.9*  HCT 31.5* 36.5* 38.8* 32.0*  MCV 91.3 92.2 91.3  --   PLT 80* 98* 145*  --     Basic Metabolic Panel: Recent Labs  Lab 08/07/21 0546 08/09/21 0719 08/09/21 0721 08/09/21 0811  NA 137 139  --  138  K 4.6 3.4*  --  3.3*  CL 108 104  --  103  CO2 25 26  --   --   GLUCOSE 144* 166*  --  141*  BUN 16 15  --  15  CREATININE 0.75 0.77  --  0.60*   CALCIUM 8.8* 8.8*  --   --   MG  --   --  1.6*  --     CBG: Recent Labs  Lab 08/06/21 0800  GLUCAP 140*    Recent Results (from the past 240 hour(s))  Blood Culture (routine x 2)     Status: None (Preliminary result)   Collection Time: 08/09/21  7:21 AM   Specimen: Right Antecubital; Blood  Result Value Ref Range Status   Specimen Description   Final    RIGHT ANTECUBITAL BOTTLES DRAWN AEROBIC AND ANAEROBIC   Special Requests   Final    Blood Culture adequate volume Performed at [facility], [address], [place], [place] [address]    Culture PENDING  Incomplete   Report Status PENDING  Incomplete  Blood Culture (routine x 2)     Status: None (Preliminary result)   Collection Time: 08/09/21  7:57 AM   Specimen: BLOOD LEFT WRIST  Result Value Ref Range Status   Specimen Description   Final    BLOOD LEFT WRIST BOTTLES DRAWN AEROBIC AND ANAEROBIC   Special Requests   Final    Blood Culture adequate volume Performed at [facility], [address], [place], [place] [address]    Culture PENDING  Incomplete   Report Status PENDING  Incomplete  SARS Coronavirus 2 by RT PCR (hospital order, performed in [place] hospital lab) *cepheid single result test* Anterior Nasal Swab     Status: None   Collection Time: 08/09/21 10:43 AM   Specimen: Anterior Nasal Swab  Result Value Ref Range Status   SARS Coronavirus 2 by RT PCR NEGATIVE NEGATIVE Final    Comment: (NOTE) SARS-CoV-2 target nucleic acids are NOT DETECTED.  The SARS-CoV-2 RNA is generally detectable in upper and lower respiratory specimens during the acute phase of infection. The lowest concentration of SARS-CoV-2 viral copies this assay can detect is 250 copies / mL. A negative result does not preclude SARS-CoV-2 infection and should not be used as the sole basis for treatment or other patient management decisions.  A negative result may occur with improper specimen collection / handling, submission of specimen  other than nasopharyngeal swab, presence of viral mutation(s) within the areas targeted by this assay, and inadequate number of viral copies (<250 copies / mL). A negative result must be combined with clinical observations, patient history, and epidemiological information.  Fact Sheet for Patients:   https://www.patel.info/  Fact Sheet for Healthcare Providers: https://hall.com/  This test is not yet approved or  cleared by the [place] FDA and has been authorized for detection and/or diagnosis of SARS-CoV-2 by FDA under an Emergency Use Authorization (EUA).  This EUA will remain in effect (meaning this test can be used) for the duration of the COVID-19 declaration under Section 564(b)(1) of the Act, 21 U.S.C. section 360bbb-3(b)(1), unless the authorization is terminated or revoked sooner.  Performed at [facility], [address] [place], [place] [address]      Radiology Studies: CT ABDOMEN PELVIS W CONTRAST  Result Date: 08/09/2021 CLINICAL DATA:  Right lower quadrant abdominal pain. EXAM: CT ABDOMEN AND PELVIS WITH CONTRAST TECHNIQUE: Multidetector CT imaging of the abdomen and pelvis was performed using the standard protocol following bolus administration of intravenous contrast. RADIATION DOSE REDUCTION: This exam was performed according to the departmental dose-optimization program which includes automated exposure control, adjustment of the mA and/or kV according to patient size and/or use of iterative reconstruction technique. CONTRAST:  64mL OMNIPAQUE IOHEXOL 300 MG/ML  SOLN COMPARISON:  06/24/2021 FINDINGS: Lower chest: Atelectasis is identified within scratch set there is subsegmental atelectasis within the dependent portion of the lung bases. Hepatobiliary: No focal liver abnormality. Status post cholecystectomy. Postop collection within the gallbladder fossa is nonspecific measuring 7.5 3.9 cm in contains amorphous  low-attenuation material mixed with gas, image 28/2. No significant bile duct dilatation. Pancreas: Diffuse pancreatic atrophy is again noted. No main duct dilatation or inflammation identified. Spleen: Normal in size without focal abnormality. Adrenals/Urinary Tract: Normal adrenal glands. 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 No follow-up recommended. No signs of nephrolithiasis or hydronephrosis. Urinary bladder appears normal. Stomach/Bowel: Moderate to large hiatal hernia. 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 Status  post appendectomy. No bowel wall thickening, inflammation, or distension. Vascular/Lymphatic: Aortic atherosclerosis without aneurysm. No signs of abdominopelvic adenopathy. Reproductive: Fiducial markers identified within the prostate gland. Other: There is trace amount of free fluid identified within the dependent portion of the pelvis. Small volume of pneumoperitoneum is noted within the upper abdomen, image 17/2. This is compatible with the early postoperative state. Musculoskeletal: No acute or significant osseous findings. IMPRESSION: 1. Status post cholecystectomy with small volume of pneumoperitoneum compatible with the early postoperative state. 2. Nonspecific postop fluid and gas collection within the gallbladder fossa is noted. Is favored to represent a postoperative seroma and/or Surgicel. 3. Trace free fluid noted within the dependent portion of the pelvis. No specific findings identified to suggest abscess. 4. Status post appendectomy. 5. Hiatal hernia. 6. Aortic Atherosclerosis (ICD10-I70.0). Electronically Signed   By: Kerby Moors M.D.   On: 08/09/2021 09:33   DG Chest Port 1 View  Result Date: 08/09/2021 CLINICAL DATA:  Chest pain EXAM: PORTABLE CHEST 1 VIEW COMPARISON:  07/02/2021 and prior studies FINDINGS: The cardiomediastinal silhouette is unchanged. Surgical clips overlying the LEFT hemithorax again noted. There is no evidence of  focal airspace disease, pulmonary edema, suspicious pulmonary nodule/mass, pleural effusion, or pneumothorax. No acute bony abnormalities are identified. Remote rib fractures are again identified. IMPRESSION: No active disease. Electronically Signed   By: Margarette Canada M.D.   On: 08/09/2021 07:25    Scheduled Meds:  Chlorhexidine Gluconate Cloth  6 each Topical Daily   feeding supplement  237 mL Oral TID BM   folic acid  1 mg Oral Daily   metoprolol tartrate  25 mg Oral BID   midodrine  5 mg Oral Q breakfast   multivitamin with minerals  1 tablet Oral Daily   pantoprazole  40 mg Oral Q1200   potassium chloride  60 mEq Oral Once   tamsulosin  0.4 mg Oral Daily   thiamine  100 mg Oral Daily   Continuous Infusions:  lactated ringers 125 mL/hr at 08/09/21 1114   magnesium sulfate bolus IVPB     piperacillin-tazobactam (ZOSYN)  IV       LOS: 0 days   Time spent: 65 mins  Kemya Shed Wynetta Emery, MD How to contact the [facility] Attending or Consulting provider [place] or covering provider during after hours [place], for this patient?  Check the care team in [facility] and look for a) attending/consulting TRH provider listed and b) the [facility] team listed Log into [email or web address] and use [place]'s universal password to access. If you do not have the password, please contact the hospital operator. Locate the [facility] provider you are looking for under [facility] and page to a number that you can be directly reached. If you still have difficulty reaching the provider, please page the [facility] (Director on Call) for the Hospitalists listed on amion for assistance.  08/09/2021, 2:56 PM

## 2021-08-09 NOTE — Progress Notes (Signed)
Pharmacy Antibiotic Note  Jon Bennett is a 87 y.o. male admitted on 08/09/2021 with  unknown source .  Pharmacy has been consulted for Vancomycin and cefepime dosing. Recently discharged on 5/26 for cholelithiasis(lap chole 5/25)  Plan: Vancomycin 1250mg  mg IV Q 24 hrs. Goal AUC 400-550. Expected AUC: 518 SCr used: 0.8 (actual 0.6)  Cefepime 2gm IV q8h F/U cxs and clinical progress Monitor V/S, labs and levels as indicated  Height: 5\' 7"  (170.2 cm) Weight: 64.4 kg (141 lb 15.6 oz) IBW/kg (Calculated) : 66.1  Temp (24hrs), Avg:101.6 F (38.7 C), Min:101.6 F (38.7 C), Max:101.6 F (38.7 C)  Recent Labs  Lab 08/07/21 0546 08/07/21 0932 08/09/21 0719 08/09/21 0721  WBC 9.8 13.2*  --  5.3  CREATININE 0.75  --  0.77  --   LATICACIDVEN  --   --   --  3.3*    Estimated Creatinine Clearance: 62.6 mL/min (by C-G formula based on SCr of 0.77 mg/dL).    Allergies  Allergen Reactions   Asa [Aspirin] Shortness Of Breath    Antimicrobials this admission: Vancomycin 5/28 >>  Cefepime 5/28 >>   Microbiology results: 5/28 BCx: pending 5/28 UCx: pending   MRSA PCR:   Thank you for allowing pharmacy to be a part of this patient's care.  6/28, BS Pharm D, BCPS Clinical Pharmacist 08/09/2021 8:24 AM

## 2021-08-09 NOTE — Assessment & Plan Note (Signed)
--   replaced potassium, phos and magnesium

## 2021-08-09 NOTE — Progress Notes (Addendum)
ANTICOAGULATION CONSULT NOTE - Initial Consult  Pharmacy Consult for Enoxaparin Indication: atrial fibrillation  Allergies  Allergen Reactions   Asa [Aspirin] Shortness Of Breath    Patient Measurements: Height: 5\' 9"  (175.3 cm) Weight: 65.8 kg (145 lb 1 oz) IBW/kg (Calculated) : 70.7  Vital Signs: Temp: 98.3 F (36.8 C) (05/28 1458) Temp Source: Oral (05/28 1458) BP: 78/50 (05/28 1600) Pulse Rate: 147 (05/28 1600)  Labs: Recent Labs    08/07/21 0546 08/07/21 0932 08/09/21 0719 08/09/21 0721 08/09/21 0811 08/09/21 0906  HGB 10.8* 12.3*  --  13.2 10.9*  --   HCT 31.5* 36.5*  --  38.8* 32.0*  --   PLT 80* 98*  --  145*  --   --   LABPROT  --   --  14.0  --   --   --   INR  --   --  1.1  --   --   --   CREATININE 0.75  --  0.77  --  0.60*  --   TROPONINIHS  --   --  16  --   --  20*    Estimated Creatinine Clearance: 64 mL/min (A) (by C-G formula based on SCr of 0.6 mg/dL (L)).   Medical History: Past Medical History:  Diagnosis Date   Diabetes mellitus without complication ([place])    Patient is not on any medication for this   GERD (gastroesophageal reflux disease)    Hypertension    Pancreatitis, alcoholic, acute    Splenic vein thrombosis    Stroke [facility])    mini strokes    Medications:  Medications Prior to Admission  Medication Sig Dispense Refill Last Dose   acamprosate (CAMPRAL) 333 MG tablet Take 666 mg by mouth 3 (three) times daily.   08/08/2021   acetaminophen (TYLENOL) 500 MG tablet Take 500 mg by mouth daily as needed for mild pain or moderate pain.   unk   bisacodyl (DULCOLAX) 10 MG suppository Place 10 mg rectally daily as needed for mild constipation. Only use if Milk of Magnesia doesn't help   unk   diclofenac Sodium (VOLTAREN) 1 % GEL Apply 4 g topically in the morning and at bedtime. Apply to left knee   08/08/2021   ELIQUIS 5 MG TABS tablet TAKE 1 TABLET BY MOUTH TWICE A DAY (Patient taking differently: Take 5 mg by mouth 2 (two) times  daily.) 60 tablet 2 08/08/2021 at 1900   feeding supplement (ENSURE ENLIVE / ENSURE PLUS) LIQD Take 237 mLs by mouth 3 (three) times daily between meals. 237 mL 12 [id number]   folic acid (FOLVITE) 1 MG tablet Take 1 tablet (1 mg total) by mouth daily.   08/08/2021   Magnesium Hydroxide (MILK OF MAGNESIA PO) Take 30 mLs by mouth daily as needed (constipation). If no BM in 3 days   unk   meloxicam (MOBIC) 7.5 MG tablet Take 7.5 mg by mouth daily.   08/08/2021   metoprolol tartrate (LOPRESSOR) 25 MG tablet Take 25 mg by mouth 2 (two) times daily.   08/08/2021 at 1900   midodrine (PROAMATINE) 5 MG tablet Take 5 mg by mouth daily as needed (SBP </=100).   unk   Multiple Vitamin (MULTIVITAMIN WITH MINERALS) TABS tablet Take 1 tablet by mouth daily.   08/08/2021   pantoprazole (PROTONIX) 40 MG tablet Take 1 tablet (40 mg total) by mouth 2 (two) times daily. 30 tablet 0 08/08/2021   Sodium Phosphates (RA SALINE ENEMA RE) Place 1 Dose  rectally daily as needed (constipation). If not relieved by Biscodyl suppository   unk   tamsulosin (FLOMAX) 0.4 MG CAPS capsule Take 0.4 mg by mouth daily.   08/08/2021   thiamine 100 MG tablet Take 1 tablet (100 mg total) by mouth daily. 30 tablet 0 08/08/2021    Assessment: 87  y/o male well known to medical service from recent hospitalization for sepsis, bacteremia and ascending cholangitis.  He had to be transferred to [facility] for an ERCP with sphincterotomy and stone extraction.  He has a history of upper GI bleeding and Mallory-Weiss tear.  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 Last dose of eliquis 5/27 at 1900. Pharmacy asked to start lovenox.  Goal of Therapy:   Monitor platelets by anticoagulation protocol: Yes   Plan:  Lovenox 1mg /kg 70mg  q12h Monitor CBC and for S/S of bleeding F/U transition to po eliquis  Isac Sarna, BS Pharm D, BCPS Clinical  Pharmacist 08/09/2021,4:06 PM

## 2021-08-09 NOTE — Assessment & Plan Note (Signed)
--   reports that he hasn't had a drink in months

## 2021-08-09 NOTE — Progress Notes (Signed)
   08/09/21 1114  Vitals  BP 108/70  MAP (mmHg) 82  Pulse Rate (!) 120  Resp 20  MEWS COLOR  MEWS Score Color Yellow  Oxygen Therapy  SpO2 98 %  O2 Device Room Air  Pain Assessment  Pain Scale 0-10  Pain Score 7  Pain Type Surgical pain  MEWS Score  MEWS Temp 0  MEWS Systolic 0  MEWS Pulse 2  MEWS RR 0  MEWS LOC 0  MEWS Score 2  Provider Notification  Provider Name/Title Franky Macho  Date Provider Notified 08/09/21  Time Provider Notified 1115  Method of Notification Page  Notification Reason Other (Comment) (Yellow MEWS)  Provider response Other (Comment);See new orders (place tele)  Date of Provider Response 08/09/21  Time of Provider Response 1116

## 2021-08-09 NOTE — Assessment & Plan Note (Addendum)
--   platelets trending down after starting enoxaparin -- stop enoxaparin today, no signs of bleeding found.  -- he has history of chronic alcoholism and chronic liver injury

## 2021-08-09 NOTE — ED Notes (Addendum)
Pharmacy notified of 2 vanc orders, 1st order initiated and is the current infusion. Subsequent orders to be rescheduled.

## 2021-08-09 NOTE — Assessment & Plan Note (Signed)
--   soft BPs, we had to reduce home metoprolol to 12.5 mg BID with holding parameters

## 2021-08-09 NOTE — Assessment & Plan Note (Addendum)
--   We have an EKG that confirms Afib RVR.  He remains very hypotensive.  -- trial of digoxin x 1 dose -- IV amiodarone bolus and infusion ordered -- enoxaparin for full anticoagulation (discussed with surgeon first) -- fortunately he converted to SR after amiodarone bolus and he remained on IV amiodarone overnight.  He is now transitioned to oral amiodarone 400 mg BID. Cardiology team ordered for a 6 week taper.

## 2021-08-09 NOTE — Assessment & Plan Note (Signed)
--   continue heart healthy diet and supplements

## 2021-08-09 NOTE — Progress Notes (Signed)
08/09/2021 4:09 PM  Pt just went into Afib RVR with HR in 140s. He is very hypotensive.  Manual BPs confirmed.  Tried dose of digoxin IV with no improvement, start IV amiodarone.  Discussed with Dr. Lovell Sheehan.  Ok to use enoxaparin injections for full anticoagulation.  Holding apixaban for now.    Maryln Manuel, MD  How to contact the [facility] Attending or Consulting provider 7A - 7P or covering provider during after hours 7P -7A, for this patient?  Check the care team in [facility] and look for a) attending/consulting TRH provider listed and b) the [facility] team listed Log into [email or web address] and use [place]'s universal password to access. If you do not have the password, please contact the hospital operator. Locate the [facility] provider you are looking for under [facility] and page to a number that you can be directly reached. If you still have difficulty reaching the provider, please page the [facility] (Director on Call) for the Hospitalists listed on amion for assistance.

## 2021-08-09 NOTE — Assessment & Plan Note (Signed)
--   protonix for GI protection ordered  ?

## 2021-08-09 NOTE — Progress Notes (Signed)
Pt had a brief 4 second pause and then went bradycardic and broke his A-fib with RVR rhythm and went back into sinus rhythm with rates into the 70's. MD made aware, instructed to turn the Amio gtt down to the 30mg  dosage and if another pause occurs to turn the gtt off.

## 2021-08-09 NOTE — Assessment & Plan Note (Signed)
--   continue wound care protocol per nursing

## 2021-08-09 NOTE — Assessment & Plan Note (Addendum)
--   no findings of atrial fibrillation seen -- continue metoprolol 25 mg BID, with holding parameters added  -- continue supportive measures as ordered  -- transfer to stepdown ICU to monitor overnight for decompensation  -- monitor for s/s of infection.. Blood cultures x 2 taken at admission  -- check TSH/Free T4 -- give additional Mg and potassium; recheck in AM

## 2021-08-10 ENCOUNTER — Observation Stay (HOSPITAL_COMMUNITY): Payer: Medicare Other

## 2021-08-10 DIAGNOSIS — K219 Gastro-esophageal reflux disease without esophagitis: Secondary | ICD-10-CM

## 2021-08-10 DIAGNOSIS — M79601 Pain in right arm: Secondary | ICD-10-CM | POA: Diagnosis not present

## 2021-08-10 DIAGNOSIS — E119 Type 2 diabetes mellitus without complications: Secondary | ICD-10-CM | POA: Diagnosis present

## 2021-08-10 DIAGNOSIS — I480 Paroxysmal atrial fibrillation: Secondary | ICD-10-CM | POA: Diagnosis present

## 2021-08-10 DIAGNOSIS — E440 Moderate protein-calorie malnutrition: Secondary | ICD-10-CM | POA: Diagnosis present

## 2021-08-10 DIAGNOSIS — I81 Portal vein thrombosis: Secondary | ICD-10-CM | POA: Diagnosis present

## 2021-08-10 DIAGNOSIS — I4891 Unspecified atrial fibrillation: Secondary | ICD-10-CM | POA: Diagnosis not present

## 2021-08-10 DIAGNOSIS — Z7901 Long term (current) use of anticoagulants: Secondary | ICD-10-CM | POA: Diagnosis not present

## 2021-08-10 DIAGNOSIS — R0902 Hypoxemia: Secondary | ICD-10-CM | POA: Diagnosis not present

## 2021-08-10 DIAGNOSIS — I10 Essential (primary) hypertension: Secondary | ICD-10-CM | POA: Diagnosis present

## 2021-08-10 DIAGNOSIS — I959 Hypotension, unspecified: Secondary | ICD-10-CM | POA: Diagnosis present

## 2021-08-10 DIAGNOSIS — E872 Acidosis, unspecified: Secondary | ICD-10-CM | POA: Diagnosis present

## 2021-08-10 DIAGNOSIS — Z66 Do not resuscitate: Secondary | ICD-10-CM | POA: Diagnosis present

## 2021-08-10 DIAGNOSIS — G8918 Other acute postprocedural pain: Secondary | ICD-10-CM | POA: Diagnosis present

## 2021-08-10 DIAGNOSIS — D649 Anemia, unspecified: Secondary | ICD-10-CM | POA: Diagnosis present

## 2021-08-10 DIAGNOSIS — R1031 Right lower quadrant pain: Secondary | ICD-10-CM | POA: Diagnosis present

## 2021-08-10 DIAGNOSIS — Z7189 Other specified counseling: Secondary | ICD-10-CM | POA: Diagnosis not present

## 2021-08-10 DIAGNOSIS — L89101 Pressure ulcer of unspecified part of back, stage 1: Secondary | ICD-10-CM | POA: Diagnosis present

## 2021-08-10 DIAGNOSIS — Z8673 Personal history of transient ischemic attack (TIA), and cerebral infarction without residual deficits: Secondary | ICD-10-CM | POA: Diagnosis not present

## 2021-08-10 DIAGNOSIS — I82890 Acute embolism and thrombosis of other specified veins: Secondary | ICD-10-CM | POA: Diagnosis present

## 2021-08-10 DIAGNOSIS — R627 Adult failure to thrive: Secondary | ICD-10-CM | POA: Diagnosis present

## 2021-08-10 DIAGNOSIS — R54 Age-related physical debility: Secondary | ICD-10-CM | POA: Diagnosis present

## 2021-08-10 DIAGNOSIS — R079 Chest pain, unspecified: Secondary | ICD-10-CM | POA: Diagnosis not present

## 2021-08-10 DIAGNOSIS — Z886 Allergy status to analgesic agent status: Secondary | ICD-10-CM | POA: Diagnosis not present

## 2021-08-10 DIAGNOSIS — E876 Hypokalemia: Secondary | ICD-10-CM | POA: Diagnosis present

## 2021-08-10 DIAGNOSIS — Z20822 Contact with and (suspected) exposure to covid-19: Secondary | ICD-10-CM | POA: Diagnosis present

## 2021-08-10 DIAGNOSIS — D696 Thrombocytopenia, unspecified: Secondary | ICD-10-CM | POA: Diagnosis present

## 2021-08-10 DIAGNOSIS — R109 Unspecified abdominal pain: Secondary | ICD-10-CM | POA: Diagnosis not present

## 2021-08-10 DIAGNOSIS — Z8249 Family history of ischemic heart disease and other diseases of the circulatory system: Secondary | ICD-10-CM | POA: Diagnosis not present

## 2021-08-10 DIAGNOSIS — F1011 Alcohol abuse, in remission: Secondary | ICD-10-CM | POA: Diagnosis present

## 2021-08-10 LAB — CBC
HCT: 30.2 % — ABNORMAL LOW (ref 39.0–52.0)
Hemoglobin: 10.3 g/dL — ABNORMAL LOW (ref 13.0–17.0)
MCH: 31.4 pg (ref 26.0–34.0)
MCHC: 34.1 g/dL (ref 30.0–36.0)
MCV: 92.1 fL (ref 80.0–100.0)
Platelets: 70 10*3/uL — ABNORMAL LOW (ref 150–400)
RBC: 3.28 MIL/uL — ABNORMAL LOW (ref 4.22–5.81)
RDW: 13.8 % (ref 11.5–15.5)
WBC: 4.7 10*3/uL (ref 4.0–10.5)
nRBC: 0 % (ref 0.0–0.2)

## 2021-08-10 LAB — COMPREHENSIVE METABOLIC PANEL
ALT: 10 U/L (ref 0–44)
AST: 17 U/L (ref 15–41)
Albumin: 2.4 g/dL — ABNORMAL LOW (ref 3.5–5.0)
Alkaline Phosphatase: 69 U/L (ref 38–126)
Anion gap: 4 — ABNORMAL LOW (ref 5–15)
BUN: 10 mg/dL (ref 8–23)
CO2: 23 mmol/L (ref 22–32)
Calcium: 7.8 mg/dL — ABNORMAL LOW (ref 8.9–10.3)
Chloride: 107 mmol/L (ref 98–111)
Creatinine, Ser: 0.68 mg/dL (ref 0.61–1.24)
GFR, Estimated: >60 mL/min (ref >60)
Glucose, Bld: 123 mg/dL — ABNORMAL HIGH (ref 70–99)
Potassium: 4.4 mmol/L (ref 3.5–5.1)
Sodium: 134 mmol/L — ABNORMAL LOW (ref 135–145)
Total Bilirubin: 0.7 mg/dL (ref 0.3–1.2)
Total Protein: 5.2 g/dL — ABNORMAL LOW (ref 6.5–8.1)

## 2021-08-10 LAB — MAGNESIUM: Magnesium: 2 mg/dL (ref 1.7–2.4)

## 2021-08-10 LAB — PHOSPHORUS: Phosphorus: 1.5 mg/dL — ABNORMAL LOW (ref 2.5–4.6)

## 2021-08-10 LAB — GLUCOSE, CAPILLARY: Glucose-Capillary: 115 mg/dL — ABNORMAL HIGH (ref 70–99)

## 2021-08-10 LAB — MRSA NEXT GEN BY PCR, NASAL: MRSA by PCR Next Gen: NOT DETECTED

## 2021-08-10 MED ORDER — POLYETHYLENE GLYCOL 3350 17 G PO PACK
17.0000 g | PACK | Freq: Two times a day (BID) | ORAL | Status: DC
  Administered 2021-08-10 – 2021-08-11 (×2): 17 g via ORAL
  Filled 2021-08-10 (×2): qty 1

## 2021-08-10 MED ORDER — POTASSIUM PHOSPHATES 15 MMOLE/5ML IV SOLN
30.0000 mmol | Freq: Once | INTRAVENOUS | Status: AC
  Administered 2021-08-10: 30 mmol via INTRAVENOUS
  Filled 2021-08-10: qty 10

## 2021-08-10 MED ORDER — METOPROLOL TARTRATE 25 MG PO TABS
12.5000 mg | ORAL_TABLET | Freq: Two times a day (BID) | ORAL | Status: DC
Start: 2021-08-10 — End: 2021-08-12
  Administered 2021-08-10 – 2021-08-12 (×4): 12.5 mg via ORAL
  Filled 2021-08-10 (×4): qty 1

## 2021-08-10 MED ORDER — AMIODARONE HCL 200 MG PO TABS
400.0000 mg | ORAL_TABLET | Freq: Two times a day (BID) | ORAL | Status: DC
  Administered 2021-08-10 – 2021-08-12 (×5): 400 mg via ORAL
  Filled 2021-08-10 (×5): qty 2

## 2021-08-10 NOTE — Evaluation (Signed)
Physical Therapy Evaluation Patient Details Name: Jon Bennett MRN: [id number] DOB: 08/26/1934 Today's Date: 08/10/2021  History of Present Illness  Patient is an 87 year old white male status post laparoscopic cholecystectomy on 08/06/2021, discharged 08/07/2021 who presents with right-sided abdominal pain and fevers.   Clinical Impression  Patient limited for functional mobility as stated below secondary to BLE weakness, fatigue and impaired standing balance. Patient seated in chair at beginning of session. He requires assist and several attempts to transfer to standing with use of RW due to LE weakness. Patient with impaired standing balance despite use of RW and is limited to several small steps due to fatigue. He ends session seated in chair. Patient will benefit from continued physical therapy in hospital and recommended venue below to increase strength, balance, endurance for safe ADLs and gait.        Recommendations for follow up therapy are one component of a multi-disciplinary discharge planning process, led by the attending physician.  Recommendations may be updated based on patient status, additional functional criteria and insurance authorization.  Follow Up Recommendations Skilled nursing-short term rehab (<3 hours/day)    Assistance Recommended at Discharge Frequent or constant Supervision/Assistance  Patient can return home with the following  A lot of help with walking and/or transfers;A lot of help with bathing/dressing/bathroom;Assistance with cooking/housework;Assist for transportation;Help with stairs or ramp for entrance    Equipment Recommendations None recommended by PT  Recommendations for Other Services       Functional Status Assessment Patient has had a recent decline in their functional status and demonstrates the ability to make significant improvements in function in a reasonable and predictable amount of time.     Precautions / Restrictions  Precautions Precautions: Fall Restrictions Weight Bearing Restrictions: No      Mobility  Bed Mobility               General bed mobility comments: seated in chair at beginning of session    Transfers Overall transfer level: Needs assistance Equipment used: Rolling walker (2 wheels) Transfers: Sit to/from Stand Sit to Stand: Mod assist           General transfer comment: slow, labored transfer to standing with RW, requires multiple attempts    Ambulation/Gait Ambulation/Gait assistance: Mod assist Gait Distance (Feet): 2 Feet Assistive device: Rolling walker (2 wheels) Gait Pattern/deviations: Trunk flexed, Shuffle       General Gait Details: limited to a few labored steps at bedside with RW, very unsteady  Stairs            Wheelchair Mobility    Modified Rankin (Stroke Patients Only)       Balance Overall balance assessment: Needs assistance Sitting-balance support: Feet supported, No upper extremity supported Sitting balance-Leahy Scale: Good Sitting balance - Comments: seated in chair   Standing balance support: Bilateral upper extremity supported, Reliant on assistive device for balance Standing balance-Leahy Scale: Poor Standing balance comment: fair/poor with RW                             Pertinent Vitals/Pain Pain Assessment Pain Assessment: No/denies pain    Home Living Family/patient expects to be discharged to:: Private residence Living Arrangements: Children Available Help at Discharge: Family;Available 24 hours/day Type of Home: House Home Access: Stairs to enter;Ramped entrance Entrance Stairs-Rails: Left Entrance Stairs-Number of Steps: 3   Home Layout: Able to live on main level with bedroom/bathroom;Full bath on main level;Two level  Home Equipment: Rolling Walker (2 wheels);Grab bars - tub/shower;Grab bars - toilet;Cane - single point      Prior Function Prior Level of Function : Independent/Modified  Independent             Mobility Comments: walked without assistive device PTA ADLs Comments: states independent with basic ADL     Hand Dominance        Extremity/Trunk Assessment   Upper Extremity Assessment Upper Extremity Assessment: Generalized weakness    Lower Extremity Assessment Lower Extremity Assessment: Generalized weakness    Cervical / Trunk Assessment Cervical / Trunk Assessment: Kyphotic  Communication   Communication: No difficulties  Cognition Arousal/Alertness: Awake/alert Behavior During Therapy: WFL for tasks assessed/performed Overall Cognitive Status: Within Functional Limits for tasks assessed                                          General Comments      Exercises     Assessment/Plan    PT Assessment Patient needs continued PT services  PT Problem List Decreased strength;Decreased mobility;Decreased activity tolerance;Decreased balance       PT Treatment Interventions DME instruction;Therapeutic exercise;Gait training;Balance training;Stair training;Neuromuscular re-education;Functional mobility training;Therapeutic activities;Patient/family education    PT Goals (Current goals can be found in the Care Plan section)  Acute Rehab PT Goals Patient Stated Goal: Return home PT Goal Formulation: With patient Time For Goal Achievement: 08/24/21 Potential to Achieve Goals: Good    Frequency Min 3X/week     Co-evaluation               AM-PAC PT "6 Clicks" Mobility  Outcome Measure Help needed turning from your back to your side while in a flat bed without using bedrails?: A Little Help needed moving from lying on your back to sitting on the side of a flat bed without using bedrails?: A Little Help needed moving to and from a bed to a chair (including a wheelchair)?: A Lot Help needed standing up from a chair using your arms (e.g., wheelchair or bedside chair)?: A Lot Help needed to walk in hospital room?: A  Lot Help needed climbing 3-5 steps with a railing? : A Lot 6 Click Score: 14    End of Session   Activity Tolerance: Patient limited by fatigue Patient left: in chair;with call bell/phone within reach Nurse Communication: Mobility status PT Visit Diagnosis: Unsteadiness on feet (R26.81);Other abnormalities of gait and mobility (R26.89);Muscle weakness (generalized) (M62.81)    Time: [id number] PT Time Calculation (min) (ACUTE ONLY): 18 min   Charges:   PT Evaluation $PT Eval Low Complexity: 1 Low PT Treatments $Therapeutic Activity: 8-22 mins        2:26 PM, 08/10/21 Mearl Latin PT, DPT Physical Therapist at [facility]

## 2021-08-10 NOTE — Assessment & Plan Note (Signed)
--  IV replacement ordered, recheck in AM ?

## 2021-08-10 NOTE — Assessment & Plan Note (Signed)
--   converted to SR after IV amio bolus, now off IV amio and on oral amiodarone -- enoxaparin for full anticoagulation ( holding apixaban temporarily per surgery )

## 2021-08-10 NOTE — Plan of Care (Signed)
  Problem: Acute Rehab PT Goals(only PT should resolve) Goal: Patient Will Transfer Sit To/From Stand Outcome: Progressing Flowsheets (Taken 08/10/2021 1428) Patient will transfer sit to/from stand:  with min guard assist  with minimal assist Goal: Pt Will Transfer Bed To Chair/Chair To Bed Outcome: Progressing Flowsheets (Taken 08/10/2021 1428) Pt will Transfer Bed to Chair/Chair to Bed:  min guard assist  with min assist Goal: Pt Will Ambulate Outcome: Progressing Flowsheets (Taken 08/10/2021 1428) Pt will Ambulate:  15 feet  with minimal assist  with least restrictive assistive device  with min guard assist Goal: Pt/caregiver will Perform Home Exercise Program Outcome: Progressing Flowsheets (Taken 08/10/2021 1428) Pt/caregiver will Perform Home Exercise Program:  For increased strengthening  For improved balance  Independently  2:28 PM, 08/10/21 Wyman Songster PT, DPT Physical Therapist at [facility]

## 2021-08-10 NOTE — Progress Notes (Signed)
Subjective: Alert, no acute complaints.  Objective: Vital signs in last 24 hours: Temp:  [98.2 F (36.8 C)-98.6 F (37 C)] 98.6 F (37 C) (05/29 0703) Pulse Rate:  [66-147] 99 (05/29 0900) Resp:  [17-30] 26 (05/29 0703) BP: (71-128)/(44-87) 128/59 (05/29 0900) SpO2:  [88 %-100 %] 90 % (05/29 0800) Weight:  [65.8 kg] 65.8 kg (05/28 1458) Last BM Date : 08/09/21  Intake/Output from previous day: 05/28 0701 - 05/29 0700 In: 6587.9 [P.O.:120; I.V.:1286.5; IV Piggyback:5181.4] Out: 754 [Urine:585; Emesis/NG output:150; Stool:19] Intake/Output this shift: Total I/O In: 248.5 [I.V.:228.2; IV Piggyback:20.2] Out: -   General appearance: alert, cooperative, and no distress Resp: clear to auscultation bilaterally Cardio: regular rate and rhythm, S1, S2 normal, no murmur, click, rub or gallop GI: Soft, flat.  Ecchymosis noted around right upper quadrant incision sites.  No hematoma present.  Lab Results:  Recent Labs    08/09/21 0721 08/09/21 0811 08/10/21 0610  WBC 5.3  --  4.7  HGB 13.2 10.9* 10.3*  HCT 38.8* 32.0* 30.2*  PLT 145*  --  70*   BMET Recent Labs    08/09/21 0719 08/09/21 0811 08/10/21 0610  NA 139 138 134*  K 3.4* 3.3* 4.4  CL 104 103 107  CO2 26  --  23  GLUCOSE 166* 141* 123*  BUN 15 15 10   CREATININE 0.77 0.60* 0.68  CALCIUM 8.8*  --  7.8*   PT/INR Recent Labs    08/09/21 0719  LABPROT 14.0  INR 1.1    Studies/Results: CT ABDOMEN PELVIS W CONTRAST  Result Date: 08/09/2021 CLINICAL DATA:  Right lower quadrant abdominal pain. EXAM: CT ABDOMEN AND PELVIS WITH CONTRAST TECHNIQUE: Multidetector CT imaging of the abdomen and pelvis was performed using the standard protocol following bolus administration of intravenous contrast. RADIATION DOSE REDUCTION: This exam was performed according to the departmental dose-optimization program which includes automated exposure control, adjustment of the mA and/or kV according to patient size and/or use of  iterative reconstruction technique. CONTRAST:  80mL OMNIPAQUE IOHEXOL 300 MG/ML  SOLN COMPARISON:  06/24/2021 FINDINGS: Lower chest: Atelectasis is identified within scratch set there is subsegmental atelectasis within the dependent portion of the lung bases. Hepatobiliary: No focal liver abnormality. Status post cholecystectomy. Postop collection within the gallbladder fossa is nonspecific measuring 7.5 3.9 cm in contains amorphous low-attenuation material mixed with gas, image 28/2. No significant bile duct dilatation. Pancreas: Diffuse pancreatic atrophy is again noted. No main duct dilatation or inflammation identified. Spleen: Normal in size without focal abnormality. Adrenals/Urinary Tract: Normal adrenal glands. 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 No follow-up recommended. No signs of nephrolithiasis or hydronephrosis. Urinary bladder appears normal. Stomach/Bowel: Moderate to large hiatal hernia. 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 Status post appendectomy. No bowel wall thickening, inflammation, or distension. Vascular/Lymphatic: Aortic atherosclerosis without aneurysm. No signs of abdominopelvic adenopathy. Reproductive: Fiducial markers identified within the prostate gland. Other: There is trace amount of free fluid identified within the dependent portion of the pelvis. Small volume of pneumoperitoneum is noted within the upper abdomen, image 17/2. This is compatible with the early postoperative state. Musculoskeletal: No acute or significant osseous findings. IMPRESSION: 1. Status post cholecystectomy with small volume of pneumoperitoneum compatible with the early postoperative state. 2. Nonspecific postop fluid and gas collection within the gallbladder fossa is noted. Is favored to represent a postoperative seroma and/or Surgicel. 3. Trace free fluid noted within the dependent portion of the pelvis. No specific findings identified to  suggest abscess. 4. Status  post appendectomy. 5. Hiatal hernia. 6. Aortic Atherosclerosis (ICD10-I70.0). Electronically Signed   By: Signa Kell M.D.   On: 08/09/2021 09:33   DG CHEST PORT 1 VIEW  Result Date: 08/10/2021 CLINICAL DATA:  Chest pain Hypoxia Recent cholecystectomy EXAM: PORTABLE CHEST - 1 VIEW COMPARISON:  08/01/2021 FINDINGS: Heart size within normal limits. No pulmonary vascular congestion. Atherosclerotic calcifications noted in the aortic arch. Left basilar airspace opacity most likely due to atelectasis. Small left pleural effusion is present. Lungs otherwise clear. Atypical hyperdensity noted in the right axillary region. IMPRESSION: 1. Left basilar opacity most likely due to atelectasis and small pleural effusion. 2. Atypical hyperdensity noted in the right axillary region. Dedicated right humerus radiographs should be obtained. These results will be called to the ordering clinician or representative by the Radiologist Assistant, and communication documented in the PACS or [employer]. Electronically Signed   By: Acquanetta Belling M.D.   On: 08/10/2021 07:56   DG Chest Port 1 View  Result Date: 08/09/2021 CLINICAL DATA:  Chest pain EXAM: PORTABLE CHEST 1 VIEW COMPARISON:  07/02/2021 and prior studies FINDINGS: The cardiomediastinal silhouette is unchanged. Surgical clips overlying the LEFT hemithorax again noted. There is no evidence of focal airspace disease, pulmonary edema, suspicious pulmonary nodule/mass, pleural effusion, or pneumothorax. No acute bony abnormalities are identified. Remote rib fractures are again identified. IMPRESSION: No active disease. Electronically Signed   By: Harmon Pier M.D.   On: 08/09/2021 07:25    Anti-infectives: Anti-infectives (From admission, onward)    Start     Dose/Rate Route Frequency Ordered Stop   08/10/21 1000  vancomycin (VANCOREADY) IVPB 1250 mg/250 mL  Status:  Discontinued        1,250 mg 166.7 mL/hr over 90 Minutes Intravenous Every 24 hours 08/09/21 0832  08/09/21 0916   08/10/21 0600  vancomycin (VANCOREADY) IVPB 1250 mg/250 mL  Status:  Discontinued        1,250 mg 166.7 mL/hr over 90 Minutes Intravenous Every 24 hours 08/09/21 0917 08/09/21 0948   08/09/21 1800  ceFEPIme (MAXIPIME) 2 g in sodium chloride 0.9 % 100 mL IVPB  Status:  Discontinued        2 g 200 mL/hr over 30 Minutes Intravenous Every 8 hours 08/09/21 0832 08/09/21 0948   08/09/21 1400  piperacillin-tazobactam (ZOSYN) IVPB 3.375 g        3.375 g 12.5 mL/hr over 240 Minutes Intravenous Every 8 hours 08/09/21 0948 08/16/21 1359   08/09/21 0930  vancomycin (VANCOREADY) IVPB 1250 mg/250 mL  Status:  Discontinued        1,250 mg 166.7 mL/hr over 90 Minutes Intravenous  Once 08/09/21 0819 08/09/21 0916   08/09/21 0815  ceFEPIme (MAXIPIME) 2 g in sodium chloride 0.9 % 100 mL IVPB        2 g 200 mL/hr over 30 Minutes Intravenous  Once 08/09/21 0809 08/09/21 0934   08/09/21 0815  metroNIDAZOLE (FLAGYL) IVPB 500 mg        500 mg 100 mL/hr over 60 Minutes Intravenous  Once 08/09/21 0809 08/09/21 0919   08/09/21 0815  vancomycin (VANCOCIN) IVPB 1000 mg/200 mL premix  Status:  Discontinued        1,000 mg 200 mL/hr over 60 Minutes Intravenous  Once 08/09/21 0809 08/09/21 0919       Assessment/Plan: Impression: Patient has now converted back to normal sinus rhythm after needing amiodarone drip for rapid atrial fibrillation.  Appreciate hospitalist input.  Is being  converted to oral amiodarone.  Agree with continuing therapeutic Lovenox injections.  Hypophosphatemia being corrected.  His LFTs are within normal limits.  His total bilirubin has returned to normal.  No leukocytosis present.  We will continue IV Zosyn for now until his condition stabilizes.  No need for HIDA scan at the present time.  Continue monitoring in ICU.  LOS: 0 days    Jon Bennett 08/10/2021

## 2021-08-10 NOTE — Progress Notes (Signed)
PROGRESS NOTE   Jon Bennett  [id number] DOB: [birth year] DOA: 08/09/2021 PCP: Neale Burly, MD   Chief Complaint  Patient presents with   Abdominal Pain    Gallbladder surgery 3 days ago   Level of care: ICU  Brief Admission History:  87  y/o male well known to medical service from recent hospitalization for sepsis, bacteremia and ascending cholangitis.  He had to be transferred to [facility] for an ERCP with sphincterotomy and stone extraction.  He has a history of upper GI bleeding and Mallory-Weiss tear that was treated with Protonix.  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  He has chronic thrombocytopenia.  He has type 2 diabetes mellitus, hypertension, history of alcoholism, GERD.  He recovered from the ascending cholangitis and subsequently followed up with surgery and had a laparoscopic cholecystectomy done on 08/06/2021.  He was discharged in good condition on 08/07/2021.  He returned to ED on 08/09/2021 with right-sided abdominal pain and fevers.  He had blood cultures drawn and he was admitted by surgery for IV antibiotics and monitoring.  He has been stable on the floor however has developed sinus tachycardia with a heart rate jumping into the 130s.  Serial EKGs have been done and he seems to be in sinus tachycardia.  Fortunately his heart rate seems to drop back down into the 70 range but it goes back up into the 120s at rest.  The patient is totally asymptomatic from the heart rate changes.  He has been restarted on his metoprolol 25 mg twice daily and had morning dose today.    I spoke with Dr. Arnoldo Morale and he requested a medical consultation.  Pt is being transferred to stepdown ICU for closer monitoring given his advanced age and co-morbidities.     Assessment and Plan: * Postoperative abdominal pain -- pain seems to be improving some today, tolerating diet.  See plan  per surgery.   -- his bilirubin is back down today.  Following.   Paroxysmal atrial fibrillation with RVR (HCC) -- We have an EKG that confirms Afib RVR.  He remains very hypotensive.  -- trial of digoxin x 1 dose -- IV amiodarone bolus and infusion ordered -- enoxaparin for full anticoagulation (discussed with surgeon first) -- fortunately he converted to SR after amiodarone bolus and he remained on IV amiodarone overnight.  He is now transitioned to oral amiodarone 400 mg BID.     Atrial fibrillation ([place]) -- converted to SR after IV amio bolus, now off IV amio and on oral amiodarone -- enoxaparin for full anticoagulation ( holding apixaban temporarily per surgery )  Hypotension -- holding parameters to metoprolol added -- bolused IV fluids with improvement  DNR (do not resuscitate) -- He has documents scanned into Epic for DNR status  ** Update: Pt requested to change his code status back to full code  -- pt has continued to decline with multiple recent hospitalizations and failing health, I have asked for a palliative consultation as I fear that coding him would cause this poor man undue pain and suffering with no meaningful recovery afterwards...however as he requested I have changed him back to full code status   History of alcohol abuse -- reports that he hasn't had a drink in months   Normocytic anemia -- hg is stable from recent discharge after lap chole -- follow with CBC in AM  Protein  calorie malnutrition ([place]) -- continue heart healthy diet and supplements   Pressure injury of skin -- continue wound care protocol per nursing    Hypokalemia  -- replace potassium, check magnesium and replace as needed   Chronic anticoagulation -- temporarily holding apixaban for now in case procedures needed  -- he is on subcutaneous enoxaparin temporarily   Hypophosphatemia -- IV replacement ordered, recheck in AM   Hypomagnesemia -- 2 gm IV given x 1 this morning -- give  additional 2 gm IV now  -- repeat Mg shows improvement   Splenic vein thrombosis -- pt is anticoagulated with apixaban (currently on hold) but he has been placed on subcutaneous enoxaparin for full dose anticoagulation   GERD (gastroesophageal reflux disease) -- protonix for GI protection ordered  Hypertension -- restarted home metoprolol 25 mg BID   Type 2 diabetes mellitus ([place]) -- this has been diet controlled, we'll keep a monitor on glucose from morning lab draws  Thrombocytopenia ([place]) -- platelets improved some but back down after starting enoxaparin, follow closely, no bleeding complications at this time -- he has history of chronic alcoholism and subsequent liver injury   DVT prophylaxis: enoxaparin Code Status: changed back to full code per patient request 5/28 Family Communication: I spoke with son in [place] on 5/28     Procedures:   Antimicrobials:  Zosyn 5/28>>  Subjective: Pt c/o right lower abdomen tenderness and right arm/shoulder tenderness  Objective: Vitals:   08/10/21 1130 08/10/21 1200 08/10/21 1300 08/10/21 1400  BP: (!) 106/55 (!) 105/58 (!) 92/52 (!) 97/46  Pulse: 70 70 67 61  Resp: (!) 26 (!) 21 (!) 24 (!) 24  Temp:      TempSrc:      SpO2: 91% 95% 93% 95%  Weight:      Height:        Intake/Output Summary (Last 24 hours) at 08/10/2021 1546 Last data filed at 08/10/2021 1205 Gross per 24 hour  Intake 4065.21 ml  Output 454 ml  Net 3611.21 ml   Filed Weights   08/09/21 0707 08/09/21 1458  Weight: 64.4 kg 65.8 kg   Examination:  General exam: frail very weak, ill appearing male, awake and alert, NAD, Appears calm and comfortable  Respiratory system: shallow breathing bilaterally.  Cardiovascular system: normal S1 & S2 heard. No JVD, murmurs, rubs, gallops or clicks. No pedal edema. Gastrointestinal system: Abdomen is nondistended, soft and right lower quadrant tenderness. No organomegaly or masses felt. Normal bowel sounds  heard. Central nervous system: Alert and oriented. No focal neurological deficits. Extremities: Symmetric 5 x 5 power. Skin: No rashes, lesions or ulcers. Psychiatry: Judgement and insight appear normal. Mood & affect appropriate.   Data Reviewed: I have personally reviewed following labs and imaging studies  CBC: Recent Labs  Lab 08/07/21 0546 08/07/21 0932 08/09/21 0721 08/09/21 0811 08/10/21 0610  WBC 9.8 13.2* 5.3  --  4.7  NEUTROABS  --   --  4.3  --   --   HGB 10.8* 12.3* 13.2 10.9* 10.3*  HCT 31.5* 36.5* 38.8* 32.0* 30.2*  MCV 91.3 92.2 91.3  --  92.1  PLT 80* 98* 145*  --  70*    Basic Metabolic Panel: Recent Labs  Lab 08/07/21 0546 08/09/21 0719 08/09/21 0721 08/09/21 0811 08/10/21 0610  NA 137 139  --  138 134*  K 4.6 3.4*  --  3.3* 4.4  CL 108 104  --  103 107  CO2 25 26  --   --  23  GLUCOSE 144* 166*  --  141* 123*  BUN 16 15  --  15 10  CREATININE 0.75 0.77  --  0.60* 0.68  CALCIUM 8.8* 8.8*  --   --  7.8*  MG  --   --  1.6*  --  2.0  PHOS  --   --   --   --  1.5*    CBG: Recent Labs  Lab 08/06/21 0800 08/10/21 0704  GLUCAP 140* 115*    Recent Results (from the past 240 hour(s))  Blood Culture (routine x 2)     Status: None (Preliminary result)   Collection Time: 08/09/21  7:21 AM   Specimen: Right Antecubital; Blood  Result Value Ref Range Status   Specimen Description   Final    RIGHT ANTECUBITAL BOTTLES DRAWN AEROBIC AND ANAEROBIC   Special Requests Blood Culture adequate volume  Final   Culture   Final    NO GROWTH < 24 HOURS Performed at [facility], [address], [place], [place] [address]    Report Status PENDING  Incomplete  Blood Culture (routine x 2)     Status: None (Preliminary result)   Collection Time: 08/09/21  7:57 AM   Specimen: BLOOD LEFT WRIST  Result Value Ref Range Status   Specimen Description   Final    BLOOD LEFT WRIST BOTTLES DRAWN AEROBIC AND ANAEROBIC   Special Requests   Final    Blood Culture adequate  volume Performed at [facility], [address] [place], [place] [address]    Culture PENDING  Incomplete   Report Status PENDING  Incomplete  SARS Coronavirus 2 by RT PCR (hospital order, performed in [place] hospital lab) *cepheid single result test* Anterior Nasal Swab     Status: None   Collection Time: 08/09/21 10:43 AM   Specimen: Anterior Nasal Swab  Result Value Ref Range Status   SARS Coronavirus 2 by RT PCR NEGATIVE NEGATIVE Final    Comment: (NOTE) SARS-CoV-2 target nucleic acids are NOT DETECTED.  The SARS-CoV-2 RNA is generally detectable in upper and lower respiratory specimens during the acute phase of infection. The lowest concentration of SARS-CoV-2 viral copies this assay can detect is 250 copies / mL. A negative result does not preclude SARS-CoV-2 infection and should not be used as the sole basis for treatment or other patient management decisions.  A negative result may occur with improper specimen collection / handling, submission of specimen other than nasopharyngeal swab, presence of viral mutation(s) within the areas targeted by this assay, and inadequate number of viral copies (<250 copies / mL). A negative result must be combined with clinical observations, patient history, and epidemiological information.  Fact Sheet for Patients:   https://www.patel.info/  Fact Sheet for Healthcare Providers: https://hall.com/  This test is not yet approved or  cleared by the [place] FDA and has been authorized for detection and/or diagnosis of SARS-CoV-2 by FDA under an Emergency Use Authorization (EUA).  This EUA will remain in effect (meaning this test can be used) for the duration of the COVID-19 declaration under Section 564(b)(1) of the Act, 21 U.S.C. section 360bbb-3(b)(1), unless the authorization is terminated or revoked sooner.  Performed at [facility], [address] [place], [place] [address]    MRSA Next Gen by PCR, Nasal     Status: None   Collection Time: 08/09/21  2:36 PM   Specimen: Nasal Mucosa; Nasal Swab  Result Value Ref Range Status   MRSA by  PCR Next Gen NOT DETECTED NOT DETECTED Final    Comment: (NOTE) The GeneXpert MRSA Assay (FDA approved for NASAL specimens only), is one component of a comprehensive MRSA colonization surveillance program. It is not intended to diagnose MRSA infection nor to guide or monitor treatment for MRSA infections. Test performance is not FDA approved in patients less than 62 years old. Performed at [facility], [address], [place], [place] [address]      Radiology Studies: CT ABDOMEN PELVIS W CONTRAST  Result Date: 08/09/2021 CLINICAL DATA:  Right lower quadrant abdominal pain. EXAM: CT ABDOMEN AND PELVIS WITH CONTRAST TECHNIQUE: Multidetector CT imaging of the abdomen and pelvis was performed using the standard protocol following bolus administration of intravenous contrast. RADIATION DOSE REDUCTION: This exam was performed according to the departmental dose-optimization program which includes automated exposure control, adjustment of the mA and/or kV according to patient size and/or use of iterative reconstruction technique. CONTRAST:  30mL OMNIPAQUE IOHEXOL 300 MG/ML  SOLN COMPARISON:  06/24/2021 FINDINGS: Lower chest: Atelectasis is identified within scratch set there is subsegmental atelectasis within the dependent portion of the lung bases. Hepatobiliary: No focal liver abnormality. Status post cholecystectomy. Postop collection within the gallbladder fossa is nonspecific measuring 7.5 3.9 cm in contains amorphous low-attenuation material mixed with gas, image 28/2. No significant bile duct dilatation. Pancreas: Diffuse pancreatic atrophy is again noted. No main duct dilatation or inflammation identified. Spleen: Normal in size without focal abnormality. Adrenals/Urinary Tract: Normal adrenal glands. 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 No follow-up recommended. No signs of nephrolithiasis or hydronephrosis. Urinary bladder appears normal. Stomach/Bowel: Moderate to large hiatal hernia. 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 Status post appendectomy. No bowel wall thickening, inflammation, or distension. Vascular/Lymphatic: Aortic atherosclerosis without aneurysm. No signs of abdominopelvic adenopathy. Reproductive: Fiducial markers identified within the prostate gland. Other: There is trace amount of free fluid identified within the dependent portion of the pelvis. Small volume of pneumoperitoneum is noted within the upper abdomen, image 17/2. This is compatible with the early postoperative state. Musculoskeletal: No acute or significant osseous findings. IMPRESSION: 1. Status post cholecystectomy with small volume of pneumoperitoneum compatible with the early postoperative state. 2. Nonspecific postop fluid and gas collection within the gallbladder fossa is noted. Is favored to represent a postoperative seroma and/or Surgicel. 3. Trace free fluid noted within the dependent portion of the pelvis. No specific findings identified to suggest abscess. 4. Status post appendectomy. 5. Hiatal hernia. 6. Aortic Atherosclerosis (ICD10-I70.0). Electronically Signed   By: Kerby Moors M.D.   On: 08/09/2021 09:33   DG CHEST PORT 1 VIEW  Result Date: 08/10/2021 CLINICAL DATA:  Chest pain Hypoxia Recent cholecystectomy EXAM: PORTABLE CHEST - 1 VIEW COMPARISON:  08/01/2021 FINDINGS: Heart size within normal limits. No pulmonary vascular congestion. Atherosclerotic calcifications noted in the aortic arch. Left basilar airspace opacity most likely due to atelectasis. Small left pleural effusion is present. Lungs otherwise clear. Atypical hyperdensity noted in the right axillary region. IMPRESSION: 1. Left basilar opacity most likely due to atelectasis and small pleural effusion. 2. Atypical hyperdensity noted in the right  axillary region. Dedicated right humerus radiographs should be obtained. These results will be called to the ordering clinician or representative by the Radiologist Assistant, and communication documented in the PACS or [employer]. Electronically Signed   By: Miachel Roux M.D.   On: 08/10/2021 07:56   DG Chest Port 1 View  Result Date: 08/09/2021 CLINICAL DATA:  Chest  pain EXAM: PORTABLE CHEST 1 VIEW COMPARISON:  07/02/2021 and prior studies FINDINGS: The cardiomediastinal silhouette is unchanged. Surgical clips overlying the LEFT hemithorax again noted. There is no evidence of focal airspace disease, pulmonary edema, suspicious pulmonary nodule/mass, pleural effusion, or pneumothorax. No acute bony abnormalities are identified. Remote rib fractures are again identified. IMPRESSION: No active disease. Electronically Signed   By: Margarette Canada M.D.   On: 08/09/2021 07:25   DG Humerus Right  Result Date: 08/10/2021 CLINICAL DATA:  Right arm pain. EXAM: RIGHT HUMERUS - 2+ VIEW COMPARISON:  Chest radiograph performed earlier on the same date. FINDINGS: There is no evidence of fracture or other focal bone lesions. Mild glenohumeral osteoarthritis. Soft tissues are unremarkable. IMPRESSION: No acute osseous abnormality. Electronically Signed   By: Keane Police D.O.   On: 08/10/2021 11:16    Scheduled Meds:  amiodarone  400 mg Oral BID   Chlorhexidine Gluconate Cloth  6 each Topical Daily   enoxaparin (LOVENOX) injection  70 mg Subcutaneous Q12H   feeding supplement  237 mL Oral TID BM   folic acid  1 mg Oral Daily   metoprolol tartrate  25 mg Oral BID   midodrine  5 mg Oral Q breakfast   multivitamin with minerals  1 tablet Oral Daily   pantoprazole  40 mg Oral Q1200   polyethylene glycol  17 g Oral BID   tamsulosin  0.4 mg Oral Daily   thiamine  100 mg Oral Daily   Continuous Infusions:  lactated ringers 75 mL/hr at 08/10/21 1533   piperacillin-tazobactam (ZOSYN)  IV 3.375 g (08/10/21 1413)      LOS: 0 days   Critical Care Procedure Note Authorized and Performed by: Murvin Natal MD  Total Critical Care time:  55 mins Due to a high probability of clinically significant, life threatening deterioration, the patient required my highest level of preparedness to intervene emergently and I personally spent this critical care time directly and personally managing the patient.  This critical care time included obtaining a history; examining the patient, pulse oximetry; ordering and review of studies; arranging urgent treatment with development of a management plan; evaluation of patient's response of treatment; frequent reassessment; and discussions with other providers.  This critical care time was performed to assess and manage the high probability of imminent and life threatening deterioration that could result in multi-organ failure.  It was exclusive of separately billable procedures and treating other patients and teaching time.    Irwin Brakeman, MD How to contact the [facility] Attending or Consulting provider [place] or covering provider during after hours [place], for this patient?  Check the care team in [employer] and look for a) attending/consulting TRH provider listed and b) the [facility] team listed Log into [email or web address] and use [place]'s universal password to access. If you do not have the password, please contact the hospital operator. Locate the [facility] provider you are looking for under [facility] and page to a number that you can be directly reached. If you still have difficulty reaching the provider, please page the [facility] (Director on Call) for the Hospitalists listed on amion for assistance.  08/10/2021, 3:46 PM

## 2021-08-10 NOTE — Progress Notes (Signed)
Patient asked multiple times by multiple nurses and tech if he would get up to the chair. Patient refused, said he just didn't feel like it. Educated patient on the importance of moving around and getting up to the chair, patient still stated he just didn't feel like it. Will continue to try to get patient up to chair throughout the shift. PT eval ordered for assistance with mobility and for some more encouragement.

## 2021-08-10 NOTE — TOC Initial Note (Signed)
Transition of Care [facility]) - Initial/Assessment Note    Patient Details  Name: Jon Bennett MRN: [id number] Date of Birth: 10/04/1934  Transition of [facility]) CM/SW Contact:    Shade Flood, LCSW Phone Number: 08/10/2021, 11:09 AM  Clinical Narrative:                  Pt admitted from home under observation status. Pt known to TOC from previous admission. Met with pt today to review dc planning. Pt reports that he intends to return home at dc and is not interested in SNF. Pt states he is not active with HH. Offered referral but pt stated that he prefers that his son assist him. Unable to reach pt's son to confirm. TOC will follow up tomorrow.  Expected Discharge Plan: [place] Barriers to Discharge: Continued Medical Work up   Patient Goals and CMS Choice Patient states their goals for this hospitalization and ongoing recovery are:: go home      Expected Discharge Plan and Services Expected Discharge Plan: [place] In-house Referral: Clinical Social Work     Living arrangements for the past 2 months: Single Family Home                                      Prior Living Arrangements/Services Living arrangements for the past 2 months: Single Family Home Lives with:: Adult Children Patient language and need for interpreter reviewed:: Yes Do you feel safe going back to the place where you live?: Yes      Need for Family Participation in Patient Care: Yes (Comment) Care giver support system in place?: Yes (comment) Current home services: DME Criminal Activity/Legal Involvement Pertinent to Current Situation/Hospitalization: No - Comment as needed  Activities of Daily Living Home Assistive Devices/Equipment: Dentures (specify type), Walker (specify type) (upper dentures, walker per previous admission notes) ADL Screening (condition at time of admission) Patient's cognitive ability adequate to safely complete daily activities?:  Yes Is the patient deaf or have difficulty hearing?: No Does the patient have difficulty seeing, even when wearing glasses/contacts?: No Does the patient have difficulty concentrating, remembering, or making decisions?: No Patient able to express need for assistance with ADLs?: Yes Does the patient have difficulty dressing or bathing?: No Independently performs ADLs?: Yes (appropriate for developmental age) Does the patient have difficulty walking or climbing stairs?: Yes Weakness of Legs: Both Weakness of Arms/Hands: None  Permission Sought/Granted                  Emotional Assessment Appearance:: Appears stated age Attitude/Demeanor/Rapport: Engaged Affect (typically observed): Pleasant Orientation: : Oriented to Self, Oriented to Place, Oriented to  Time, Oriented to Situation Alcohol / Substance Use: Not Applicable Psych Involvement: No (comment)  Admission diagnosis:  Lactic acidosis [E87.20] Tachycardia [R00.0] Right lower quadrant abdominal pain [R10.31] Postoperative abdominal pain [R10.9, G89.18] Fever in adult [R50.9] Patient Active Problem List   Diagnosis Date Noted   Postoperative abdominal pain 08/09/2021   Sinus tachycardia 08/09/2021   History of alcohol abuse 08/09/2021   DNR (do not resuscitate) 08/09/2021   Paroxysmal atrial fibrillation with RVR ([place]) 08/09/2021   Hypotension 08/09/2021   Cholelithiasis 08/06/2021   Normocytic anemia 07/24/2021   Protein calorie malnutrition ([place]) 07/23/2021   Left knee pain 07/21/2021   Pressure injury of skin 07/04/2021   Hypokalemia 06/29/2021   Hyperbilirubinemia 06/27/2021  Calculus of gallbladder with acute cholecystitis and obstruction    Intractable nausea and vomiting 06/24/2021   Hypomagnesemia 06/24/2021   Lactic acidosis 06/24/2021   Hypercalcemia 06/24/2021   Transaminitis 06/24/2021   GI bleed 06/24/2021   AKI (acute kidney injury) ([place]) 06/24/2021   Hypophosphatemia 06/24/2021   Ascending  cholangitis with severe sepsis 06/24/2021   Chronic anticoagulation 06/24/2021   Hyponatremia 08/24/2020   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   Dyskinesia    Absolute anemia    Akathisia 06/29/2019   Nausea and vomiting 06/29/2019   Acathisia 06/29/2019   Acute hyperactive alcohol withdrawal delirium ([place]) 04/24/2019   Type 2 diabetes mellitus (HCC)    Hypertension    GERD (gastroesophageal reflux disease)    Thrombocytopenia ([place]) 08/16/2018   Alcohol withdrawal ([place]) 08/13/2018   PCP:  Neale Burly, MD Pharmacy:   [id number] [place], [place] - [address] [place] [address]Phone: [phone]: [phone] [facility] [address]: [phone]: [phone]    Social Determinants of Health (SDOH) Interventions    Readmission Risk Interventions    07/03/2021    2:10 PM 06/26/2021    3:09 PM 08/27/2020   11:33 AM  Readmission Risk Prevention Plan  Transportation Screening Complete Complete Complete  PCP or Specialist Appt within 5-7 Days Complete  Complete  Home Care Screening Complete  Complete  Medication Review (RN CM) Complete  Complete  HRI or Home Care Consult  Complete   Palliative Care Screening  Not Applicable   Medication Review (RN Care Manager)  Complete

## 2021-08-11 ENCOUNTER — Other Ambulatory Visit (HOSPITAL_COMMUNITY): Payer: Self-pay | Admitting: *Deleted

## 2021-08-11 ENCOUNTER — Inpatient Hospital Stay (HOSPITAL_COMMUNITY): Payer: Medicare Other

## 2021-08-11 DIAGNOSIS — I480 Paroxysmal atrial fibrillation: Secondary | ICD-10-CM | POA: Diagnosis not present

## 2021-08-11 DIAGNOSIS — I4891 Unspecified atrial fibrillation: Secondary | ICD-10-CM

## 2021-08-11 DIAGNOSIS — R109 Unspecified abdominal pain: Secondary | ICD-10-CM | POA: Diagnosis not present

## 2021-08-11 DIAGNOSIS — Z66 Do not resuscitate: Secondary | ICD-10-CM | POA: Diagnosis not present

## 2021-08-11 DIAGNOSIS — Z7901 Long term (current) use of anticoagulants: Secondary | ICD-10-CM | POA: Diagnosis not present

## 2021-08-11 LAB — BLOOD CULTURE ID PANEL (REFLEXED) - BCID2

## 2021-08-11 LAB — COMPREHENSIVE METABOLIC PANEL
ALT: 7 U/L (ref 0–44)
AST: 18 U/L (ref 15–41)
Albumin: 1.9 g/dL — ABNORMAL LOW (ref 3.5–5.0)
Alkaline Phosphatase: 57 U/L (ref 38–126)
Anion gap: 5 (ref 5–15)
BUN: 9 mg/dL (ref 8–23)
CO2: 23 mmol/L (ref 22–32)
Calcium: 7.6 mg/dL — ABNORMAL LOW (ref 8.9–10.3)
Chloride: 105 mmol/L (ref 98–111)
Creatinine, Ser: 0.78 mg/dL (ref 0.61–1.24)
GFR, Estimated: >60 mL/min (ref >60)
Glucose, Bld: 105 mg/dL — ABNORMAL HIGH (ref 70–99)
Potassium: 4.7 mmol/L (ref 3.5–5.1)
Sodium: 133 mmol/L — ABNORMAL LOW (ref 135–145)
Total Bilirubin: 0.8 mg/dL (ref 0.3–1.2)
Total Protein: 4.3 g/dL — ABNORMAL LOW (ref 6.5–8.1)

## 2021-08-11 LAB — MAGNESIUM: Magnesium: 1.9 mg/dL (ref 1.7–2.4)

## 2021-08-11 LAB — ECHOCARDIOGRAM COMPLETE
AR max vel: 2.68 cm2
AV Area VTI: 2.88 cm2
AV Area mean vel: 2.62 cm2
AV Mean grad: 5 mmHg
AV Peak grad: 9.7 mmHg
Ao pk vel: 1.56 m/s
Area-P 1/2: 4.06 cm2
Calc EF: 59.9 %
Height: 69 in
MV VTI: 2.6 cm2
S' Lateral: 2.4 cm
Single Plane A2C EF: 49 %
Single Plane A4C EF: 68.3 %
Weight: 2522.06 oz

## 2021-08-11 LAB — CBC
HCT: 26.5 % — ABNORMAL LOW (ref 39.0–52.0)
Hemoglobin: 8.9 g/dL — ABNORMAL LOW (ref 13.0–17.0)
MCH: 31.7 pg (ref 26.0–34.0)
MCHC: 33.6 g/dL (ref 30.0–36.0)
MCV: 94.3 fL (ref 80.0–100.0)
Platelets: 50 10*3/uL — ABNORMAL LOW (ref 150–400)
RBC: 2.81 MIL/uL — ABNORMAL LOW (ref 4.22–5.81)
RDW: 14 % (ref 11.5–15.5)
WBC: 4.5 10*3/uL (ref 4.0–10.5)
nRBC: 0 % (ref 0.0–0.2)

## 2021-08-11 LAB — PHOSPHORUS: Phosphorus: 2.7 mg/dL (ref 2.5–4.6)

## 2021-08-11 MED ORDER — MORPHINE SULFATE (PF) 2 MG/ML IV SOLN: 1.0000 mg | INTRAVENOUS | Status: DC | PRN

## 2021-08-11 MED ORDER — APIXABAN 2.5 MG PO TABS: 2.5000 mg | ORAL_TABLET | Freq: Two times a day (BID) | ORAL | Status: DC

## 2021-08-11 NOTE — [place] FL2 (Signed)
[place] MEDICAID FL2 LEVEL OF CARE SCREENING TOOL     IDENTIFICATION  Patient Name: Jon Bennett Birthdate: [birth year] Sex: male Admission Date (Current Location): 08/09/2021  [facility] and [place] Number:  [employer] and Address:  [facility],  [address], [place] [address]      Provider Number: [id number]  Attending Physician Name and Address:  Franky Macho, MD  Relative Name and Phone Number:       Current Level of Care: Hospital Recommended Level of Care: Skilled Nursing Facility Prior Approval Number:    Date Approved/Denied:   PASRR Number: [id number]  Discharge Plan: SNF    Current Diagnoses: Patient Active Problem List   Diagnosis Date Noted   Atrial fibrillation (HCC) 08/10/2021   Postoperative abdominal pain 08/09/2021   History of alcohol abuse 08/09/2021   DNR (do not resuscitate) 08/09/2021   Paroxysmal atrial fibrillation with RVR (HCC) 08/09/2021   Hypotension 08/09/2021   Cholelithiasis 08/06/2021   Normocytic anemia 07/24/2021   Protein calorie malnutrition (HCC) 07/23/2021   Left knee pain 07/21/2021   Pressure injury of skin 07/04/2021   Hypokalemia 06/29/2021   Hyperbilirubinemia 06/27/2021   Calculus of gallbladder with acute cholecystitis and obstruction    Intractable nausea and vomiting 06/24/2021   Hypomagnesemia 06/24/2021   Lactic acidosis 06/24/2021   Hypercalcemia 06/24/2021   Transaminitis 06/24/2021   GI bleed 06/24/2021   AKI (acute kidney injury) (HCC) 06/24/2021   Hypophosphatemia 06/24/2021   Ascending cholangitis with severe sepsis 06/24/2021   Chronic anticoagulation 06/24/2021   Hyponatremia 08/24/2020   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   Dyskinesia    Absolute anemia    Akathisia 06/29/2019   Nausea and vomiting 06/29/2019   Acathisia 06/29/2019   Acute hyperactive alcohol withdrawal delirium (HCC) 04/24/2019   Type 2 diabetes mellitus (HCC)    Hypertension    GERD  (gastroesophageal reflux disease)    Thrombocytopenia (HCC) 08/16/2018   Alcohol withdrawal (HCC) 08/13/2018    Orientation RESPIRATION BLADDER Height & Weight     Self, Time, Situation, Place  Normal External catheter Weight: 157 lb 10.1 oz (71.5 kg) Height:  5\' 9"  (175.3 cm)  BEHAVIORAL SYMPTOMS/MOOD NEUROLOGICAL BOWEL NUTRITION STATUS      Incontinent Diet (Heart healthy. See d/c summary for updates.)  AMBULATORY STATUS COMMUNICATION OF NEEDS Skin   Extensive Assist Verbally Bruising, Other (Comment) (Redness right buttocks)                       Personal Care Assistance Level of Assistance  Bathing, Dressing, Feeding Bathing Assistance: Maximum assistance Feeding assistance: Limited assistance Dressing Assistance: Maximum assistance     Functional Limitations Info  Sight, Hearing, Speech Sight Info: Adequate Hearing Info: Adequate Speech Info: Adequate    SPECIAL CARE FACTORS FREQUENCY  PT (By licensed PT)     PT Frequency: 5x weekly              Contractures      Additional Factors Info  Code Status, Allergies Code Status Info: Full code Allergies Info: Asa (Aspirin)           Current Medications (08/11/2021):  This is the current hospital active medication list Current Facility-Administered Medications  Medication Dose Route Frequency Provider Last Rate Last Admin   acetaminophen (TYLENOL) tablet 650 mg  650 mg Oral Q6H PRN 08/13/2021, MD   650 mg at 08/09/21 1832   Or   acetaminophen (TYLENOL) suppository 650 mg  650 mg Rectal Q6H PRN Franky Macho, MD       amiodarone (PACERONE) tablet 400 mg  400 mg Oral BID Laural Benes, Clanford L, MD   400 mg at 08/10/21 2129   Chlorhexidine Gluconate Cloth 2 % PADS 6 each  6 each Topical Daily Franky Macho, MD   6 each at 08/10/21 0910   enoxaparin (LOVENOX) injection 70 mg  70 mg Subcutaneous Q12H Franky Macho, MD   70 mg at 08/11/21 0440   feeding supplement (ENSURE ENLIVE / ENSURE PLUS) liquid 237 mL   237 mL Oral TID BM Franky Macho, MD   237 mL at 08/10/21 1946   folic acid (FOLVITE) tablet 1 mg  1 mg Oral Daily Franky Macho, MD   1 mg at 08/10/21 6948   lactated ringers infusion   Intravenous Continuous Franky Macho, MD 50 mL/hr at 08/11/21 0903 Rate Change at 08/11/21 0903   LORazepam (ATIVAN) injection 0.5 mg  0.5 mg Intravenous Q4H PRN Franky Macho, MD   0.5 mg at 08/10/21 2129   metoprolol tartrate (LOPRESSOR) tablet 12.5 mg  12.5 mg Oral BID Laural Benes, Clanford L, MD   12.5 mg at 08/10/21 2129   midodrine (PROAMATINE) tablet 5 mg  5 mg Oral Q breakfast Franky Macho, MD   5 mg at 08/10/21 0730   morphine (PF) 2 MG/ML injection 2 mg  2 mg Intravenous Q3H PRN Franky Macho, MD   2 mg at 08/10/21 0544   multivitamin with minerals tablet 1 tablet  1 tablet Oral Daily Franky Macho, MD   1 tablet at 08/10/21 0906   ondansetron (ZOFRAN-ODT) disintegrating tablet 4 mg  4 mg Oral Q6H PRN Franky Macho, MD       Or   ondansetron [facility]) injection 4 mg  4 mg Intravenous Q6H PRN Franky Macho, MD   4 mg at 08/10/21 0734   pantoprazole (PROTONIX) EC tablet 40 mg  40 mg Oral Q1200 Franky Macho, MD   40 mg at 08/10/21 0906   piperacillin-tazobactam (ZOSYN) IVPB 3.375 g  3.375 g Intravenous Q8H Franky Macho, MD 12.5 mL/hr at 08/11/21 0441 3.375 g at 08/11/21 0441   polyethylene glycol (MIRALAX / GLYCOLAX) packet 17 g  17 g Oral BID Franky Macho, MD   17 g at 08/10/21 2129   simethicone (MYLICON) chewable tablet 40 mg  40 mg Oral Q6H PRN Franky Macho, MD   40 mg at 08/09/21 1833   tamsulosin (FLOMAX) capsule 0.4 mg  0.4 mg Oral Daily Franky Macho, MD   0.4 mg at 08/10/21 5462   thiamine tablet 100 mg  100 mg Oral Daily Franky Macho, MD   100 mg at 08/10/21 0906   traMADol (ULTRAM) tablet 50 mg  50 mg Oral Q6H PRN Franky Macho, MD   50 mg at 08/10/21 0005     Discharge Medications: Please see discharge summary for a list of discharge medications.  Relevant Imaging Results:  Relevant Lab  Results:   Additional Information SSN: [id number].  Karn Cassis, LCSW

## 2021-08-11 NOTE — Progress Notes (Signed)
Subjective: Patient has no complaints.  His abdominal pain has resolved.  Objective: Vital signs in last 24 hours: Temp:  [97.9 F (36.6 C)-100.5 F (38.1 C)] 99.3 F (37.4 C) (05/30 0700) Pulse Rate:  [25-99] 82 (05/30 0700) Resp:  [18-30] 30 (05/30 0700) BP: (82-128)/(40-59) 87/41 (05/30 0500) SpO2:  [85 %-97 %] 91 % (05/30 0700) Weight:  [71.5 kg] 71.5 kg (05/30 0454) Last BM Date : 08/09/21  Intake/Output from previous day: 05/29 0701 - 05/30 0700 In: 1894.6 [I.V.:1353.2; IV Piggyback:541.4] Out: 100 [Urine:100] Intake/Output this shift: No intake/output data recorded.  General appearance: alert, cooperative, and no distress Resp: clear to auscultation bilaterally Cardio: regular rate and rhythm, S1, S2 normal, no murmur, click, rub or gallop GI: soft, non-tender; bowel sounds normal; no masses,  no organomegaly and incisions healing well.  Lab Results:  Recent Labs    08/10/21 0610 08/11/21 0350  WBC 4.7 4.5  HGB 10.3* 8.9*  HCT 30.2* 26.5*  PLT 70* 50*   BMET Recent Labs    08/10/21 0610 08/11/21 0350  NA 134* 133*  K 4.4 4.7  CL 107 105  CO2 23 23  GLUCOSE 123* 105*  BUN 10 9  CREATININE 0.68 0.78  CALCIUM 7.8* 7.6*   PT/INR Recent Labs    08/09/21 0719  LABPROT 14.0  INR 1.1    Studies/Results: CT ABDOMEN PELVIS W CONTRAST  Result Date: 08/09/2021 CLINICAL DATA:  Right lower quadrant abdominal pain. EXAM: CT ABDOMEN AND PELVIS WITH CONTRAST TECHNIQUE: Multidetector CT imaging of the abdomen and pelvis was performed using the standard protocol following bolus administration of intravenous contrast. RADIATION DOSE REDUCTION: This exam was performed according to the departmental dose-optimization program which includes automated exposure control, adjustment of the mA and/or kV according to patient size and/or use of iterative reconstruction technique. CONTRAST:  43mL OMNIPAQUE IOHEXOL 300 MG/ML  SOLN COMPARISON:  06/24/2021 FINDINGS: Lower  chest: Atelectasis is identified within scratch set there is subsegmental atelectasis within the dependent portion of the lung bases. Hepatobiliary: No focal liver abnormality. Status post cholecystectomy. Postop collection within the gallbladder fossa is nonspecific measuring 7.5 3.9 cm in contains amorphous low-attenuation material mixed with gas, image 28/2. No significant bile duct dilatation. Pancreas: Diffuse pancreatic atrophy is again noted. No main duct dilatation or inflammation identified. Spleen: Normal in size without focal abnormality. Adrenals/Urinary Tract: Normal adrenal glands. 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 No follow-up recommended. No signs of nephrolithiasis or hydronephrosis. Urinary bladder appears normal. Stomach/Bowel: Moderate to large hiatal hernia. 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 Status post appendectomy. No bowel wall thickening, inflammation, or distension. Vascular/Lymphatic: Aortic atherosclerosis without aneurysm. No signs of abdominopelvic adenopathy. Reproductive: Fiducial markers identified within the prostate gland. Other: There is trace amount of free fluid identified within the dependent portion of the pelvis. Small volume of pneumoperitoneum is noted within the upper abdomen, image 17/2. This is compatible with the early postoperative state. Musculoskeletal: No acute or significant osseous findings. IMPRESSION: 1. Status post cholecystectomy with small volume of pneumoperitoneum compatible with the early postoperative state. 2. Nonspecific postop fluid and gas collection within the gallbladder fossa is noted. Is favored to represent a postoperative seroma and/or Surgicel. 3. Trace free fluid noted within the dependent portion of the pelvis. No specific findings identified to suggest abscess. 4. Status post appendectomy. 5. Hiatal hernia. 6. Aortic Atherosclerosis (ICD10-I70.0). Electronically Signed   By: Kerby Moors M.D.    On: 08/09/2021 09:33  DG CHEST PORT 1 VIEW  Result Date: 08/10/2021 CLINICAL DATA:  Chest pain Hypoxia Recent cholecystectomy EXAM: PORTABLE CHEST - 1 VIEW COMPARISON:  08/01/2021 FINDINGS: Heart size within normal limits. No pulmonary vascular congestion. Atherosclerotic calcifications noted in the aortic arch. Left basilar airspace opacity most likely due to atelectasis. Small left pleural effusion is present. Lungs otherwise clear. Atypical hyperdensity noted in the right axillary region. IMPRESSION: 1. Left basilar opacity most likely due to atelectasis and small pleural effusion. 2. Atypical hyperdensity noted in the right axillary region. Dedicated right humerus radiographs should be obtained. These results will be called to the ordering clinician or representative by the Radiologist Assistant, and communication documented in the PACS or [employer]. Electronically Signed   By: Miachel Roux M.D.   On: 08/10/2021 07:56   DG Humerus Right  Result Date: 08/10/2021 CLINICAL DATA:  Right arm pain. EXAM: RIGHT HUMERUS - 2+ VIEW COMPARISON:  Chest radiograph performed earlier on the same date. FINDINGS: There is no evidence of fracture or other focal bone lesions. Mild glenohumeral osteoarthritis. Soft tissues are unremarkable. IMPRESSION: No acute osseous abnormality. Electronically Signed   By: Keane Police D.O.   On: 08/10/2021 11:16    Anti-infectives: Anti-infectives (From admission, onward)    Start     Dose/Rate Route Frequency Ordered Stop   08/10/21 1000  vancomycin (VANCOREADY) IVPB 1250 mg/250 mL  Status:  Discontinued        1,250 mg 166.7 mL/hr over 90 Minutes Intravenous Every 24 hours 08/09/21 0832 08/09/21 0916   08/10/21 0600  vancomycin (VANCOREADY) IVPB 1250 mg/250 mL  Status:  Discontinued        1,250 mg 166.7 mL/hr over 90 Minutes Intravenous Every 24 hours 08/09/21 0917 08/09/21 0948   08/09/21 1800  ceFEPIme (MAXIPIME) 2 g in sodium chloride 0.9 % 100 mL IVPB  Status:   Discontinued        2 g 200 mL/hr over 30 Minutes Intravenous Every 8 hours 08/09/21 0832 08/09/21 0948   08/09/21 1400  piperacillin-tazobactam (ZOSYN) IVPB 3.375 g        3.375 g 12.5 mL/hr over 240 Minutes Intravenous Every 8 hours 08/09/21 0948 08/16/21 1359   08/09/21 0930  vancomycin (VANCOREADY) IVPB 1250 mg/250 mL  Status:  Discontinued        1,250 mg 166.7 mL/hr over 90 Minutes Intravenous  Once 08/09/21 0819 08/09/21 0916   08/09/21 0815  ceFEPIme (MAXIPIME) 2 g in sodium chloride 0.9 % 100 mL IVPB        2 g 200 mL/hr over 30 Minutes Intravenous  Once 08/09/21 0809 08/09/21 0934   08/09/21 0815  metroNIDAZOLE (FLAGYL) IVPB 500 mg        500 mg 100 mL/hr over 60 Minutes Intravenous  Once 08/09/21 0809 08/09/21 0919   08/09/21 0815  vancomycin (VANCOCIN) IVPB 1000 mg/200 mL premix  Status:  Discontinued        1,000 mg 200 mL/hr over 60 Minutes Intravenous  Once 08/09/21 0809 08/09/21 0919       Assessment/Plan: Impression: Intermittent rapid atrial fibrillation.  [place] cardiology consultation.  Given thrombocytopenia, do agree with stopping Lovenox.  As I do not think any procedure is needed, may switch to oral anticoagulation as needed.  2D echo pending.   LOS: 1 day    Aviva Signs 08/11/2021

## 2021-08-11 NOTE — Progress Notes (Signed)
*  PRELIMINARY RESULTS* Echocardiogram 2D Echocardiogram has been performed.  Carolyne Fiscal 08/11/2021, 9:22 AM

## 2021-08-11 NOTE — Progress Notes (Signed)
PROGRESS NOTE   Jon Bennett  WUJ:[id number] DOB: Jul 22, 1934 DOA: 08/09/2021 PCP: Toma Deiters, MD   Chief Complaint  Patient presents with   Abdominal Pain    Gallbladder surgery 3 days ago   Level of care: Stepdown  Brief Admission History:  87  y/o male well known to medical service from recent hospitalization for sepsis, bacteremia and ascending cholangitis.  He had to be transferred to [facility] for an ERCP with sphincterotomy and stone extraction.  He has a history of upper GI bleeding and Mallory-Weiss tear that was treated with Protonix.  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  He has chronic thrombocytopenia.  He has type 2 diabetes mellitus, hypertension, history of alcoholism, GERD.  He recovered from the ascending cholangitis and subsequently followed up with surgery and had a laparoscopic cholecystectomy done on 08/06/2021.  He was discharged in good condition on 08/07/2021.  He returned to ED on 08/09/2021 with right-sided abdominal pain and fevers.  He had blood cultures drawn and he was admitted by surgery for IV antibiotics and monitoring.  He has been stable on the floor however has developed sinus tachycardia with a heart rate jumping into the 130s.  Serial EKGs have been done and he seems to be in sinus tachycardia.  Fortunately his heart rate seems to drop back down into the 70 range but it goes back up into the 120s at rest.  The patient is totally asymptomatic from the heart rate changes.  He has been restarted on his metoprolol 25 mg twice daily and had morning dose today.    I spoke with Dr. Lovell Sheehan and he requested a medical consultation.  Pt is being transferred to stepdown ICU for closer monitoring given his advanced age and co-morbidities.    08/11/2021:  Pt converted to SR, off IV amiodarone and on oral amiodarone.  Enoxaparin stopped due to low platelets.   Bilirubin normalized. Per surgery ok to restart oral anticoagulation.     Assessment and Plan: * Postoperative abdominal pain -- pain seems to be improving today, tolerating diet.  See plan per surgery.   -- his bilirubin is back down to normal limits.   Paroxysmal atrial fibrillation with RVR (HCC) -- We have an EKG that confirms Afib RVR.  He remains very hypotensive.  -- trial of digoxin x 1 dose -- IV amiodarone bolus and infusion ordered -- enoxaparin for full anticoagulation (discussed with surgeon first) -- fortunately he converted to SR after amiodarone bolus and he remained on IV amiodarone overnight.  He is now transitioned to oral amiodarone 400 mg BID. Cardiology team ordered for a 6 week taper.     Atrial fibrillation (HCC) -- converted to SR after IV amio bolus, now off IV amio and on oral amiodarone -- discontinue enoxaparin due to thrombocytopenia  Hypotension -- holding parameters to metoprolol added -- bolused IV fluids with improvement  DNR (do not resuscitate) -- He has documents scanned into Epic for DNR status  ** Update: Pt requested to change his code status back to full code  -- pt has continued to decline with multiple recent hospitalizations and failing health, I have asked for a palliative consultation as I fear that coding him would cause this poor man undue pain and suffering with no meaningful recovery afterwards...however as he requested I have changed him back to full code status   History of  alcohol abuse -- reports that he hasn't had a drink in months   Normocytic anemia -- hg trended down a bit today likely from IV fluids.  -- no signs of bleeding found -- follow with CBC in AM  Protein calorie malnutrition (HCC) -- continue heart healthy diet and supplements   Pressure injury of skin -- continue wound care protocol per nursing    Hypokalemia  -- replaced potassium, phos and magnesium   Chronic anticoagulation -- temporarily held  apixaban in case of procedures needed  -- he was on subcutaneous enoxaparin temporarily but will discontinue due to low platelets -- discussed with surgery, no procedures anticipated now -- restart apixaban   Hypophosphatemia -- IV replacement ordered and repleted    Hypomagnesemia -- this has been repleted with IV magnesium    Splenic vein thrombosis -- pt is anticoagulated with apixaban which we plan to restart   GERD (gastroesophageal reflux disease) -- protonix for GI protection ordered  Hypertension -- soft BPs, we had to reduce home metoprolol to 12.5 mg BID with holding parameters  Type 2 diabetes mellitus (HCC) -- this has been diet controlled, we monitored glucose from morning lab draws  BS has been well controlled   Thrombocytopenia (HCC) -- platelets trending down after starting enoxaparin -- stop enoxaparin today, no signs of bleeding found.  -- he has history of chronic alcoholism and chronic liver injury   DVT prophylaxis: enoxaparin Code Status: changed back to full code per patient request 5/28 Family Communication: I spoke with son in [place] on 5/28     Procedures:   Antimicrobials:  Zosyn 5/28>>  Subjective: Pt had some mild emesis earlier today but resolved now.  He sat up in chair for several hours yesterday. Objective: Vitals:   08/11/21 1200 08/11/21 1300 08/11/21 1400 08/11/21 1605  BP: (!) 102/51 (!) 106/55 115/67   Pulse: 84 76 76   Resp: (!) 25 (!) 27 (!) 21   Temp:    98.6 F (37 C)  TempSrc:    Oral  SpO2: 98% 96% 98%   Weight:      Height:        Intake/Output Summary (Last 24 hours) at 08/11/2021 1605 Last data filed at 08/11/2021 1529 Gross per 24 hour  Intake 847.24 ml  Output 575 ml  Net 272.24 ml   Filed Weights   08/09/21 0707 08/09/21 1458 08/11/21 0454  Weight: 64.4 kg 65.8 kg 71.5 kg   Examination:  General exam: frail very weak, ill appearing male, awake and alert, NAD, Appears calm and comfortable   Respiratory system: shallow breathing bilaterally.  Cardiovascular system: normal S1 & S2 heard. No JVD, murmurs, rubs, gallops or clicks. No pedal edema. Gastrointestinal system: Abdomen is nondistended, soft and right lower quadrant tenderness. No organomegaly or masses felt. Normal bowel sounds heard. Central nervous system: Alert and oriented. No focal neurological deficits. Extremities: Symmetric 5 x 5 power. Skin: No rashes, lesions or ulcers. Psychiatry: Judgement and insight appear normal. Mood & affect appropriate.   Data Reviewed: I have personally reviewed following labs and imaging studies  CBC: Recent Labs  Lab 08/07/21 0546 08/07/21 0932 08/09/21 0721 08/09/21 0811 08/10/21 0610 08/11/21 0350  WBC 9.8 13.2* 5.3  --  4.7 4.5  NEUTROABS  --   --  4.3  --   --   --   HGB 10.8* 12.3* 13.2 10.9* 10.3* 8.9*  HCT 31.5* 36.5* 38.8* 32.0* 30.2* 26.5*  MCV 91.3 92.2 91.3  --  92.1 94.3  PLT 80* 98* 145*  --  70* 50*    Basic Metabolic Panel: Recent Labs  Lab 08/07/21 0546 08/09/21 0719 08/09/21 0721 08/09/21 0811 08/10/21 0610 08/11/21 0350  NA 137 139  --  138 134* 133*  K 4.6 3.4*  --  3.3* 4.4 4.7  CL 108 104  --  103 107 105  CO2 25 26  --   --  23 23  GLUCOSE 144* 166*  --  141* 123* 105*  BUN 16 15  --  CREATININE 0.75 0.77  --  0.60* 0.68 0.78  CALCIUM 8.8* 8.8*  --   --  7.8* 7.6*  MG  --   --  1.6*  --  2.0 1.9  PHOS  --   --   --   --  1.5* 2.7    CBG: Recent Labs  Lab 08/06/21 0800 08/10/21 0704  GLUCAP 140* 115*    Recent Results (from the past 240 hour(s))  Blood Culture (routine x 2)     Status: None (Preliminary result)   Collection Time: 08/09/21  7:21 AM   Specimen: Right Antecubital; Blood  Result Value Ref Range Status   Specimen Description   Final    RIGHT ANTECUBITAL BOTTLES DRAWN AEROBIC AND ANAEROBIC   Special Requests Blood Culture adequate volume  Final   Culture   Final    NO GROWTH 2 DAYS Performed at [facility], [address] [place], [place] [address]    Report Status PENDING  Incomplete  Blood Culture (routine x 2)     Status: None (Preliminary result)   Collection Time: 08/09/21  7:57 AM   Specimen: BLOOD LEFT WRIST  Result Value Ref Range Status   Specimen Description   Final    BLOOD LEFT WRIST BOTTLES DRAWN AEROBIC AND ANAEROBIC   Special Requests   Final    Blood Culture adequate volume Performed at [facility], [address], [place], [place] [address]    Culture PENDING  Incomplete   Report Status PENDING  Incomplete  SARS Coronavirus 2 by RT PCR (hospital order, performed in [facility] hospital lab) *cepheid single result test* Anterior Nasal Swab     Status: None   Collection Time: 08/09/21 10:43 AM   Specimen: Anterior Nasal Swab  Result Value Ref Range Status   SARS Coronavirus 2 by RT PCR NEGATIVE NEGATIVE Final    Comment: (NOTE) SARS-CoV-2 target nucleic acids are NOT DETECTED.  The SARS-CoV-2 RNA is generally detectable in upper and lower respiratory specimens during the acute phase of infection. The lowest concentration of SARS-CoV-2 viral copies this assay can detect is 250 copies / mL. A negative result does not preclude SARS-CoV-2 infection and should not be used as the sole basis for treatment or other patient management decisions.  A negative result may occur with improper specimen collection / handling, submission of specimen other than nasopharyngeal swab, presence of viral mutation(s) within the areas targeted by this assay, and inadequate number of viral copies (<250 copies / mL). A negative result must be combined with clinical observations, patient history, and epidemiological information.  Fact Sheet for Patients:   RoadLapTop.co.za  Fact Sheet for Healthcare Providers: http://kim-miller.com/  This test is not yet approved or  cleared by the [place] FDA and has been authorized for detection  and/or diagnosis of SARS-CoV-2 by FDA under an Emergency Use Authorization (EUA).  This EUA will remain in effect (meaning this  test can be used) for the duration of the COVID-19 declaration under Section 564(b)(1) of the Act, 21 U.S.C. section 360bbb-3(b)(1), unless the authorization is terminated or revoked sooner.  Performed at [facility], [address] [place], [place] [address]   MRSA Next Gen by PCR, Nasal     Status: None   Collection Time: 08/09/21  2:36 PM   Specimen: Nasal Mucosa; Nasal Swab  Result Value Ref Range Status   MRSA by PCR Next Gen NOT DETECTED NOT DETECTED Final    Comment: (NOTE) The GeneXpert MRSA Assay (FDA approved for NASAL specimens only), is one component of a comprehensive MRSA colonization surveillance program. It is not intended to diagnose MRSA infection nor to guide or monitor treatment for MRSA infections. Test performance is not FDA approved in patients less than 59 years old. Performed at [facility], [address], [place], [place] [address]      Radiology Studies: DG CHEST PORT 1 VIEW  Result Date: 08/10/2021 CLINICAL DATA:  Chest pain Hypoxia Recent cholecystectomy EXAM: PORTABLE CHEST - 1 VIEW COMPARISON:  08/01/2021 FINDINGS: Heart size within normal limits. No pulmonary vascular congestion. Atherosclerotic calcifications noted in the aortic arch. Left basilar airspace opacity most likely due to atelectasis. Small left pleural effusion is present. Lungs otherwise clear. Atypical hyperdensity noted in the right axillary region. IMPRESSION: 1. Left basilar opacity most likely due to atelectasis and small pleural effusion. 2. Atypical hyperdensity noted in the right axillary region. Dedicated right humerus radiographs should be obtained. These results will be called to the ordering clinician or representative by the Radiologist Assistant, and communication documented in the PACS or [employer]. Electronically Signed   By: Acquanetta Belling M.D.    On: 08/10/2021 07:56   DG Humerus Right  Result Date: 08/10/2021 CLINICAL DATA:  Right arm pain. EXAM: RIGHT HUMERUS - 2+ VIEW COMPARISON:  Chest radiograph performed earlier on the same date. FINDINGS: There is no evidence of fracture or other focal bone lesions. Mild glenohumeral osteoarthritis. Soft tissues are unremarkable. IMPRESSION: No acute osseous abnormality. Electronically Signed   By: Larose Hires D.O.   On: 08/10/2021 11:16   ECHOCARDIOGRAM COMPLETE  Result Date: 08/11/2021    ECHOCARDIOGRAM REPORT   Patient Name:   ISIDRO MONKS Date of Exam: 08/11/2021 Medical Rec #:  [id number]      Height:       69.0 in Accession #:    [id number]     Weight:       157.6 lb Date of Birth:  1935/01/27      BSA:          1.867 m Patient Age:    86 years       BP:           87/41 mmHg Patient Gender: M              HR:           88 bpm. [facility] Procedure: 2D Echo, Cardiac Doppler and Color Doppler Indications:    Atrial Fibrillation  History:        Patient has prior history of Echocardiogram examinations, most                 recent 10/28/2019. Arrythmias:Atrial Fibrillation; Risk                 Factors:Hypertension and Diabetes.  Sonographer:    Mikki Harbor Referring Phys: [id number] Dorothe Pea BRANCH IMPRESSIONS  1. Left ventricular ejection fraction, by estimation, is 60 to 65%. The left ventricle has normal function. The left ventricle has no regional wall motion abnormalities. There is mild left ventricular hypertrophy. Left ventricular diastolic parameters are indeterminate. Elevated left atrial pressure.  2. Right ventricular systolic function is normal. The right ventricular size is normal.  3. The mitral valve is normal in structure. No evidence of mitral valve regurgitation. No evidence of mitral stenosis.  4. The tricuspid valve is abnormal.  5. The aortic valve is tricuspid. Aortic valve regurgitation is not visualized. No aortic stenosis is present. FINDINGS  Left Ventricle:  Left ventricular ejection fraction, by estimation, is 60 to 65%. The left ventricle has normal function. The left ventricle has no regional wall motion abnormalities. The left ventricular internal cavity size was normal in size. There is  mild left ventricular hypertrophy. Left ventricular diastolic parameters are indeterminate. Elevated left atrial pressure. Right Ventricle: Indeterminant PASP, IVC not visualized. The right ventricular size is normal. No increase in right ventricular wall thickness. Right ventricular systolic function is normal. Left Atrium: Left atrial size was normal in size. Right Atrium: Right atrial size was normal in size. Pericardium: There is no evidence of pericardial effusion. Mitral Valve: The mitral valve is normal in structure. No evidence of mitral valve regurgitation. No evidence of mitral valve stenosis. MV peak gradient, 6.7 mmHg. The mean mitral valve gradient is 2.0 mmHg. Tricuspid Valve: The tricuspid valve is abnormal. Tricuspid valve regurgitation is mild . No evidence of tricuspid stenosis. Aortic Valve: The aortic valve is tricuspid. Aortic valve regurgitation is not visualized. No aortic stenosis is present. Aortic valve mean gradient measures 5.0 mmHg. Aortic valve peak gradient measures 9.7 mmHg. Aortic valve area, by VTI measures 2.88 cm. Pulmonic Valve: The pulmonic valve was not well visualized. Pulmonic valve regurgitation is not visualized. No evidence of pulmonic stenosis. Aorta: The aortic root is normal in size and structure. Venous: The inferior vena cava was not well visualized. IAS/Shunts: The interatrial septum was not well visualized.  LEFT VENTRICLE PLAX 2D LVIDd:         3.90 cm     Diastology LVIDs:         2.40 cm     LV e' medial:    6.09 cm/s LV PW:         1.10 cm     LV E/e' medial:  17.4 LV IVS:        1.10 cm     LV e' lateral:   5.87 cm/s LVOT diam:     2.00 cm     LV E/e' lateral: 18.1 LV SV:         94 LV SV Index:   50 LVOT Area:     3.14 cm   LV Volumes (MOD) LV vol d, MOD A2C: 35.9 ml LV vol d, MOD A4C: 51.5 ml LV vol s, MOD A2C: 18.3 ml LV vol s, MOD A4C: 16.3 ml LV SV MOD A2C:     17.6 ml LV SV MOD A4C:     51.5 ml LV SV MOD BP:      27.2 ml RIGHT VENTRICLE RV Basal diam:  4.10 cm RV Mid diam:    3.30 cm RV S prime:     21.50 cm/s TAPSE (M-mode): 2.6 cm LEFT ATRIUM             Index        RIGHT ATRIUM  Index LA diam:        3.80 cm 2.04 cm/m   RA Area:     18.60 cm LA Vol (A2C):   35.0 ml 18.74 ml/m  RA Volume:   51.00 ml  27.31 ml/m LA Vol (A4C):   72.4 ml 38.77 ml/m LA Biplane Vol: 51.2 ml 27.42 ml/m  AORTIC VALVE                     PULMONIC VALVE AV Area (Vmax):    2.68 cm      PV Vmax:       1.25 m/s AV Area (Vmean):   2.62 cm      PV Peak grad:  6.2 mmHg AV Area (VTI):     2.88 cm AV Vmax:           156.00 cm/s AV Vmean:          106.000 cm/s AV VTI:            0.325 m AV Peak Grad:      9.7 mmHg AV Mean Grad:      5.0 mmHg LVOT Vmax:         133.00 cm/s LVOT Vmean:        88.300 cm/s LVOT VTI:          0.298 m LVOT/AV VTI ratio: 0.92  AORTA Ao Root diam: 3.20 cm Ao Asc diam:  2.90 cm MITRAL VALVE                TRICUSPID VALVE MV Area (PHT): 4.06 cm     TR Peak grad:   35.0 mmHg MV Area VTI:   2.60 cm     TR Vmax:        296.00 cm/s MV Peak grad:  6.7 mmHg MV Mean grad:  2.0 mmHg     SHUNTS MV Vmax:       1.29 m/s     Systemic VTI:  0.30 m MV Vmean:      68.8 cm/s    Systemic Diam: 2.00 cm MV Decel Time: 187 msec MV E velocity: 106.00 cm/s MV A velocity: 80.90 cm/s MV E/A ratio:  1.31 Dina RichJonathan Branch MD Electronically signed by Dina RichJonathan Branch MD Signature Date/Time: 08/11/2021/11:37:19 AM    Final     Scheduled Meds:  amiodarone  400 mg Oral BID   Chlorhexidine Gluconate Cloth  6 each Topical Daily   feeding supplement  237 mL Oral TID BM   folic acid  1 mg Oral Daily   metoprolol tartrate  12.5 mg Oral BID   midodrine  5 mg Oral Q breakfast   multivitamin with minerals  1 tablet Oral Daily   pantoprazole  40 mg  Oral Q1200   polyethylene glycol  17 g Oral BID   tamsulosin  0.4 mg Oral Daily   thiamine  100 mg Oral Daily   Continuous Infusions:  lactated ringers 50 mL/hr at 08/11/21 0903   piperacillin-tazobactam (ZOSYN)  IV 3.375 g (08/11/21 1535)     LOS: 1 day   Authorized and Performed by: Maryln Manuel. Okie Bogacz MD  Total  Care time:  40 mins  Daylynn Stumpp Laural BenesJohnson, MD How to contact the [facility] Attending or Consulting provider 7A - 7P or covering provider during after hours 7P -7A, for this patient?  Check the care team in [facility] and look for a) attending/consulting [facility] provider listed and b) the [facility] team listed Log into [email or web address] and use [place]'s  universal password to access. If you do not have the password, please contact the hospital operator. Locate the [facility] provider you are looking for under [facility] and page to a number that you can be directly reached. If you still have difficulty reaching the provider, please page the [facility] (Director on Call) for the Hospitalists listed on amion for assistance.  08/11/2021, 4:05 PM

## 2021-08-11 NOTE — TOC Progression Note (Signed)
Transition of Care [facility]) - Progression Note    Patient Details  Name: [name] MRN: [id number] Date of Birth: [birth year]  [facility] CM/SW Contact  Elliot Gault, LCSW Phone Number: 08/11/2021, 11:16 AM  Clinical Narrative:     TOC following. Spoke with pt's son, [name], and also pt today to review PT recommendation for SNF rehab at dc. Today pt is agreeable to SNF referrals. CMS provider options reviewed. Will refer as requested. Pt will need insurance authorization prior to transfer. MD anticipating dc in 1-2 days.  TOC will follow.  Expected Discharge Plan: Skilled Nursing Facility Barriers to Discharge: Continued Medical Work up  Expected Discharge Plan and Services Expected Discharge Plan: Skilled Nursing Facility In-house Referral: Clinical Social Work   Post Acute Care Choice: Skilled Nursing Facility Living arrangements for the past 2 months: Single Family Home                                       Social Determinants of Health (SDOH) Interventions    Readmission Risk Interventions    07/03/2021    2:10 PM 06/26/2021    3:09 PM 08/27/2020   11:33 AM  Readmission Risk Prevention Plan  Transportation Screening Complete Complete Complete  PCP or Specialist Appt within 5-7 Days Complete  Complete  Home Care Screening Complete  Complete  Medication Review (RN CM) Complete  Complete  HRI or Home Care Consult  Complete   Palliative Care Screening  Not Applicable   Medication Review (RN Care Manager)  Complete

## 2021-08-11 NOTE — Progress Notes (Signed)
Physical Therapy Treatment Patient Details Name: Jon Bennett MRN: [id number] DOB: October 21, 1934 Today's Date: 08/11/2021   History of Present Illness Patient is an 87 year old white male status post laparoscopic cholecystectomy on 08/06/2021, discharged 08/07/2021 who presents with right-sided abdominal pain and fevers.    PT Comments    Patient demonstrates slow labored movement for rolling to side and sitting up at bedside with fair/poor carryover for propping up on elbows to hands due to weakness.  Patient had bout of diarrhea during treatment and limited to a few slow labored steps during transfer to [facility] and later to chair due to generalized weakness and poor standing balance.  Patient tolerated sitting up in chair after therapy - RN aware.  Patient will benefit from continued skilled physical therapy in hospital and recommended venue below to increase strength, balance, endurance for safe ADLs and gait.      Recommendations for follow up therapy are one component of a multi-disciplinary discharge planning process, led by the attending physician.  Recommendations may be updated based on patient status, additional functional criteria and insurance authorization.  Follow Up Recommendations  Skilled nursing-short term rehab (<3 hours/day)     Assistance Recommended at Discharge    Patient can return home with the following A lot of help with walking and/or transfers;A lot of help with bathing/dressing/bathroom;Assistance with cooking/housework;Assist for transportation;Help with stairs or ramp for entrance   Equipment Recommendations  None recommended by PT    Recommendations for Other Services       Precautions / Restrictions Precautions Precautions: Fall Restrictions Weight Bearing Restrictions: No     Mobility  Bed Mobility Overal bed mobility: Needs Assistance Bed Mobility: Supine to Sit, Rolling Rolling: Min assist, Mod assist   Supine to sit: Mod assist     General  bed mobility comments: slow labored movement with poor carryover for propping up on elbows to hands due to weakness    Transfers Overall transfer level: Needs assistance Equipment used: Rolling walker (2 wheels) Transfers: Sit to/from Stand, Bed to chair/wheelchair/BSC Sit to Stand: Mod assist   Step pivot transfers: Mod assist       General transfer comment: slow labored unsteady movement with near loss of balance    Ambulation/Gait Ambulation/Gait assistance: Mod assist Gait Distance (Feet): 5 Feet Assistive device: Rolling walker (2 wheels) Gait Pattern/deviations: Decreased step length - right, Decreased step length - left, Decreased stride length, Shuffle, Staggering right, Staggering left Gait velocity: decreased     General Gait Details: limited to a few slow labored steps at bedside due to BLE weakness with frequent shuffling of feet   Stairs             Wheelchair Mobility    Modified Rankin (Stroke Patients Only)       Balance Overall balance assessment: Needs assistance Sitting-balance support: Feet supported, No upper extremity supported Sitting balance-Leahy Scale: Fair Sitting balance - Comments: fair/good seated at EOB   Standing balance support: Reliant on assistive device for balance, During functional activity, Bilateral upper extremity supported Standing balance-Leahy Scale: Poor Standing balance comment: fair/poor with RW                            Cognition Arousal/Alertness: Awake/alert Behavior During Therapy: WFL for tasks assessed/performed Overall Cognitive Status: Within Functional Limits for tasks assessed  Exercises General Exercises - Lower Extremity Long Arc Quad: Seated, AROM, Strengthening, Both, 10 reps Hip Flexion/Marching: Seated, AROM, Strengthening, Both, 10 reps Toe Raises: Seated, AROM, Strengthening, Both, 10 reps Heel Raises: Seated, AROM,  Strengthening, Both, 10 reps    General Comments        Pertinent Vitals/Pain Pain Assessment Pain Assessment: No/denies pain    Home Living                          Prior Function            PT Goals (current goals can now be found in the care plan section) Acute Rehab PT Goals Patient Stated Goal: Return home PT Goal Formulation: With patient Time For Goal Achievement: 08/24/21 Potential to Achieve Goals: Good Progress towards PT goals: Progressing toward goals    Frequency    Min 3X/week      PT Plan Current plan remains appropriate    Co-evaluation              AM-PAC PT "6 Clicks" Mobility   Outcome Measure  Help needed turning from your back to your side while in a flat bed without using bedrails?: A Little Help needed moving from lying on your back to sitting on the side of a flat bed without using bedrails?: A Lot Help needed moving to and from a bed to a chair (including a wheelchair)?: A Lot Help needed standing up from a chair using your arms (e.g., wheelchair or bedside chair)?: A Lot Help needed to walk in hospital room?: A Lot Help needed climbing 3-5 steps with a railing? : A Lot 6 Click Score: 13    End of Session   Activity Tolerance: Patient tolerated treatment well;Patient limited by fatigue Patient left: in chair;with call bell/phone within reach Nurse Communication: Mobility status PT Visit Diagnosis: Unsteadiness on feet (R26.81);Other abnormalities of gait and mobility (R26.89);Muscle weakness (generalized) (M62.81)     Time: [id number] PT Time Calculation (min) (ACUTE ONLY): 29 min  Charges:  $Therapeutic Exercise: 8-22 mins $Therapeutic Activity: 8-22 mins                     2:43 PM, 08/11/21 Lonell Grandchild, MPT Physical Therapist with [facility] [phone] office [phone] mobile phone

## 2021-08-11 NOTE — Consult Note (Signed)
Cardiology Consultation:   Patient ID: Jon Bennett MRN: [id number]; DOB: 09-26-1934  Admit date: 08/09/2021 Date of Consult: 08/11/2021  PCP:  Neale Burly, MD   [facility] Providers Cardiologist:  None        Patient Profile:   Jon Bennett is a 87 y.o. 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 Recent lap chole following episode of ascending cholangitis. Few days after surgery presented with fever and right sided abdominal pain. In this setting develoepd new onset afib with RVR for which cardiology is consulted. Initially from primary notes issues with low bp's, managed with IV amio and digoxin. Patient denies any symptoms.    WBC 4.5 Hgb 8.9 Plt 50 K 3.3 Cr 0.78 BUN 9 Mg 1.9 Phos 2.7 CXR left baslar opacity likely atelectasis and small effusion EKG sinus tach. Tele reviewed and confirmed episodes of afib with RVR  10/2019 echo: LVEF 60-65%, no WMAs, normal diastolic function, normal RV, normal LA Past Medical History:  Diagnosis Date   Diabetes mellitus without complication ([place])    Patient is not on any medication for this   GERD (gastroesophageal reflux disease)    Hypertension    Pancreatitis, alcoholic, acute    Splenic vein thrombosis    Stroke ([place])    mini strokes    Past Surgical History:  Procedure Laterality Date   APPENDECTOMY     CAROTID ANGIOGRAPHY     CHOLECYSTECTOMY N/A 08/06/2021   Procedure: LAPAROSCOPIC CHOLECYSTECTOMY;  Surgeon: Aviva Signs, MD;  Location: [facility];  Service: General;  Laterality: N/A;   ERCP N/A 07/03/2021   Procedure: ENDOSCOPIC RETROGRADE CHOLANGIOPANCREATOGRAPHY (ERCP);  Surgeon:  Jackquline Denmark, MD;  Location: [facility] ENDOSCOPY;  Service: Gastroenterology;  Laterality: N/A;   esophageal tear repair     ESOPHAGOGASTRODUODENOSCOPY (EGD) WITH PROPOFOL N/A 06/25/2021   Procedure: ESOPHAGOGASTRODUODENOSCOPY (EGD) WITH PROPOFOL;  Surgeon: Rogene Houston, MD;  Location: [facility] ENDO SUITE;  Service: Endoscopy;  Laterality: N/A;   REMOVAL OF STONES  07/03/2021   Procedure: REMOVAL OF STONES;  Surgeon: Jackquline Denmark, MD;  Location: [facility] ENDOSCOPY;  Service: Gastroenterology;;   [name]  07/03/2021   Procedure: [name];  Surgeon: Jackquline Denmark, MD;  Location: [facility] ENDOSCOPY;  Service: Gastroenterology;;   VASCULAR SURGERY        Inpatient Medications: Scheduled Meds:  amiodarone  400 mg Oral BID   Chlorhexidine Gluconate Cloth  6 each Topical Daily   enoxaparin (LOVENOX) injection  70 mg Subcutaneous Q12H   feeding supplement  237 mL Oral TID BM   folic acid  1 mg Oral Daily   metoprolol tartrate  12.5 mg Oral BID   midodrine  5 mg Oral Q breakfast   multivitamin with minerals  1 tablet Oral Daily   pantoprazole  40 mg Oral Q1200   polyethylene glycol  17 g Oral BID   tamsulosin  0.4 mg Oral Daily   thiamine  100 mg Oral Daily   Continuous Infusions:  lactated ringers 75 mL/hr at 08/10/21 2019   piperacillin-tazobactam (ZOSYN)  IV 3.375 g (08/11/21 0441)   PRN Meds: acetaminophen **OR** acetaminophen, LORazepam, morphine injection, ondansetron **OR** ondansetron (ZOFRAN)  IV, simethicone, traMADol  Allergies:    Allergies  Allergen Reactions   Asa [Aspirin] Shortness Of Breath    Social History:   Social History   Socioeconomic History   Marital status: Widowed    Spouse name: Not on file   Number of children: 4   Years of education: Not on file   Highest education level: Not on file  Occupational History   Occupation: retired  Tobacco Use   Smoking status: Never   Smokeless tobacco: Never  Vaping Use   Vaping Use: Never used  Substance and  Sexual Activity   Alcohol use: Not Currently    Alcohol/week: 1.0 standard drink    Types: 1 Shots of liquor per week   Drug use: Never   Sexual activity: Not Currently  Other Topics Concern   Not on file  Social History Narrative   Not on file   Social Determinants of Health   Financial Resource Strain: Not on file  Food Insecurity: Not on file  Transportation Needs: Not on file  Physical Activity: Not on file  Stress: Not on file  Social Connections: Not on file  Intimate Partner Violence: Not on file    Family History:    Family History  Problem Relation Age of Onset   Hypertension Other    Heart disease Father    Healthy Sister    Healthy Brother    Cancer Maternal Grandfather    Healthy Brother    Healthy Brother    Healthy Sister    Healthy Sister    Healthy Sister    Healthy Brother    Healthy Son    Healthy Son    Healthy Son    Healthy Son      ROS:  Please see the history of present illness.   All other ROS reviewed and negative.     Physical Exam/Data:   Vitals:   08/11/21 0454 08/11/21 0500 08/11/21 0647 08/11/21 0700  BP:  (!) 87/41    Pulse:  75 79 82  Resp:  (!) 27 (!) 27 (!) 30  Temp:   99.8 F (37.7 C) 99.3 F (37.4 C)  TempSrc:   Axillary Oral  SpO2:  94% 91% 91%  Weight: 71.5 kg     Height:        Intake/Output Summary (Last 24 hours) at 08/11/2021 0836 Last data filed at 08/10/2021 2019 Gross per 24 hour  Intake 1646.12 ml  Output 100 ml  Net 1546.12 ml      08/11/2021    4:54 AM 08/09/2021    2:58 PM 08/09/2021    7:07 AM  Last 3 Weights  Weight (lbs) 157 lb 10.1 oz 145 lb 1 oz 141 lb 15.6 oz  Weight (kg) 71.5 kg 65.8 kg 64.4 kg     Body mass index is 23.28 kg/m.  General:  Well nourished, well developed, in no acute distress HEENT: normal Neck: no JVD Vascular: No carotid bruits; Distal pulses 2+ bilaterally Cardiac:  normal S1, S2; RRR; no murmur  Lungs:  clear to auscultation bilaterally, no wheezing, rhonchi or  rales  Abd: soft, nontender, no hepatomegaly  Ext: no edema Musculoskeletal:  No deformities, BUE and BLE strength normal and equal Skin: warm and dry  Neuro:  CNs 2-12 intact, no focal abnormalities noted Psych:  Normal affect     Laboratory Data:  High Sensitivity Troponin:   Recent Labs  Lab 08/09/21 0719 08/09/21 0906  TROPONINIHS 16 20*  Chemistry Recent Labs  Lab 08/09/21 0719 08/09/21 0721 08/09/21 0811 08/10/21 0610 08/11/21 0350  NA 139  --  138 134* 133*  K 3.4*  --  3.3* 4.4 4.7  CL 104  --  103 107 105  CO2 26  --   --  23 23  GLUCOSE 166*  --  141* 123* 105*  BUN 15  --  15 10 9   CREATININE 0.77  --  0.60* 0.68 0.78  CALCIUM 8.8*  --   --  7.8* 7.6*  MG  --  1.6*  --  2.0 1.9  GFRNONAA >60  --   --  >60 >60  ANIONGAP 9  --   --  4* 5    Recent Labs  Lab 08/09/21 0719 08/10/21 0610 08/11/21 0350  PROT 6.9 5.2* 4.3*  ALBUMIN 3.4* 2.4* 1.9*  AST 23 17 18   ALT 13 10 7   ALKPHOS 88 69 57  BILITOT 1.7* 0.7 0.8   Lipids No results for input(s): CHOL, TRIG, HDL, LABVLDL, LDLCALC, CHOLHDL in the last 168 hours.  Hematology Recent Labs  Lab 08/09/21 0721 08/09/21 0811 08/10/21 0610 08/11/21 0350  WBC 5.3  --  4.7 4.5  RBC 4.25  --  3.28* 2.81*  HGB 13.2 10.9* 10.3* 8.9*  HCT 38.8* 32.0* 30.2* 26.5*  MCV 91.3  --  92.1 94.3  MCH 31.1  --  31.4 31.7  MCHC 34.0  --  34.1 33.6  RDW 13.8  --  13.8 14.0  PLT 145*  --  70* 50*   Thyroid  Recent Labs  Lab 08/09/21 0721  TSH 0.970  FREET4 1.10    BNPNo results for input(s): BNP, PROBNP in the last 168 hours.  DDimer No results for input(s): DDIMER in the last 168 hours.   Radiology/Studies:  CT ABDOMEN PELVIS W CONTRAST  Result Date: 08/09/2021 CLINICAL DATA:  Right lower quadrant abdominal pain. EXAM: CT ABDOMEN AND PELVIS WITH CONTRAST TECHNIQUE: Multidetector CT imaging of the abdomen and pelvis was performed using the standard protocol following bolus administration of intravenous  contrast. RADIATION DOSE REDUCTION: This exam was performed according to the departmental dose-optimization program which includes automated exposure control, adjustment of the mA and/or kV according to patient size and/or use of iterative reconstruction technique. CONTRAST:  20mL OMNIPAQUE IOHEXOL 300 MG/ML  SOLN COMPARISON:  06/24/2021 FINDINGS: Lower chest: Atelectasis is identified within scratch set there is subsegmental atelectasis within the dependent portion of the lung bases. Hepatobiliary: No focal liver abnormality. Status post cholecystectomy. Postop collection within the gallbladder fossa is nonspecific measuring 7.5 3.9 cm in contains amorphous low-attenuation material mixed with gas, image 28/2. No significant bile duct dilatation. Pancreas: Diffuse pancreatic atrophy is again noted. No main duct dilatation or inflammation identified. Spleen: Normal in size without focal abnormality. Adrenals/Urinary Tract: Normal adrenal glands. 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 No follow-up recommended. No signs of nephrolithiasis or hydronephrosis. Urinary bladder appears normal. Stomach/Bowel: Moderate to large hiatal hernia. 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 Status post appendectomy. No bowel wall thickening, inflammation, or distension. Vascular/Lymphatic: Aortic atherosclerosis without aneurysm. No signs of abdominopelvic adenopathy. Reproductive: Fiducial markers identified within the prostate gland. Other: There is trace amount of free fluid identified within the dependent portion of the pelvis. Small volume of pneumoperitoneum is noted within the upper abdomen, image 17/2. This is compatible with the early postoperative state. Musculoskeletal: No acute or significant osseous findings. IMPRESSION: 1. Status post cholecystectomy  with small volume of pneumoperitoneum compatible with the early postoperative state. 2. Nonspecific postop fluid and gas collection within  the gallbladder fossa is noted. Is favored to represent a postoperative seroma and/or Surgicel. 3. Trace free fluid noted within the dependent portion of the pelvis. No specific findings identified to suggest abscess. 4. Status post appendectomy. 5. Hiatal hernia. 6. Aortic Atherosclerosis (ICD10-I70.0). Electronically Signed   By: Kerby Moors M.D.   On: 08/09/2021 09:33   DG CHEST PORT 1 VIEW  Result Date: 08/10/2021 CLINICAL DATA:  Chest pain Hypoxia Recent cholecystectomy EXAM: PORTABLE CHEST - 1 VIEW COMPARISON:  08/01/2021 FINDINGS: Heart size within normal limits. No pulmonary vascular congestion. Atherosclerotic calcifications noted in the aortic arch. Left basilar airspace opacity most likely due to atelectasis. Small left pleural effusion is present. Lungs otherwise clear. Atypical hyperdensity noted in the right axillary region. IMPRESSION: 1. Left basilar opacity most likely due to atelectasis and small pleural effusion. 2. Atypical hyperdensity noted in the right axillary region. Dedicated right humerus radiographs should be obtained. These results will be called to the ordering clinician or representative by the Radiologist Assistant, and communication documented in the PACS or [employer]. Electronically Signed   By: Miachel Roux M.D.   On: 08/10/2021 07:56   DG Chest Port 1 View  Result Date: 08/09/2021 CLINICAL DATA:  Chest pain EXAM: PORTABLE CHEST 1 VIEW COMPARISON:  07/02/2021 and prior studies FINDINGS: The cardiomediastinal silhouette is unchanged. Surgical clips overlying the LEFT hemithorax again noted. There is no evidence of focal airspace disease, pulmonary edema, suspicious pulmonary nodule/mass, pleural effusion, or pneumothorax. No acute bony abnormalities are identified. Remote rib fractures are again identified. IMPRESSION: No active disease. Electronically Signed   By: Margarette Canada M.D.   On: 08/09/2021 07:25   DG Humerus Right  Result Date: 08/10/2021 CLINICAL  DATA:  Right arm pain. EXAM: RIGHT HUMERUS - 2+ VIEW COMPARISON:  Chest radiograph performed earlier on the same date. FINDINGS: There is no evidence of fracture or other focal bone lesions. Mild glenohumeral osteoarthritis. Soft tissues are unremarkable. IMPRESSION: No acute osseous abnormality. Electronically Signed   By: Keane Police D.O.   On: 08/10/2021 11:16     Assessment and Plan:   1.Afib - new diagnosis this admission. Tele reviewed and confirm episodes of afib with RVR - he is 5 days out from lap chole, admitted with recurrent abdominal pain and fever.Perhaps primary inciting event - soft bp's limited options, was started on IV amiodarone - transitoned to oral amio 400mg  bid, also on lopressor 12.5mg  bid with midodrine for bp support - he has been on chronic long term anticoag for prior splenic/portal thrombosis. Now on lovenox in case procedures needed, if platelets drop less than 50 would consider stopping anticoag  - would plan for 6 weeks of amio and then d/c as outpatient - would plan for amio 400mg  bid x 7 days, then 200mg  bid x 2 weeks, then 200mg  daily.    We will f/u telemetry tomorrow, f/u echo.       CHA2DS2-VASc Score = 4  1} This indicates a 4.8% annual risk of stroke. The patient's score is based upon: CHF History: 0 HTN History: 1 Diabetes History: 1 Stroke History: 0 Vascular Disease History: 0 Age Score: 2 Gender Score: 0     For questions or updates, please contact [place] Please consult [email or web address] for contact info under    Signed, Carlyle Dolly, MD  08/11/2021 8:36 AM

## 2021-08-12 ENCOUNTER — Encounter (HOSPITAL_COMMUNITY): Payer: Self-pay | Admitting: General Surgery

## 2021-08-12 DIAGNOSIS — E872 Acidosis, unspecified: Secondary | ICD-10-CM

## 2021-08-12 DIAGNOSIS — I10 Essential (primary) hypertension: Secondary | ICD-10-CM | POA: Diagnosis not present

## 2021-08-12 DIAGNOSIS — K760 Fatty (change of) liver, not elsewhere classified: Secondary | ICD-10-CM | POA: Diagnosis not present

## 2021-08-12 DIAGNOSIS — R634 Abnormal weight loss: Secondary | ICD-10-CM | POA: Diagnosis not present

## 2021-08-12 DIAGNOSIS — Z7189 Other specified counseling: Secondary | ICD-10-CM

## 2021-08-12 DIAGNOSIS — N401 Enlarged prostate with lower urinary tract symptoms: Secondary | ICD-10-CM | POA: Diagnosis not present

## 2021-08-12 DIAGNOSIS — R627 Adult failure to thrive: Secondary | ICD-10-CM | POA: Diagnosis not present

## 2021-08-12 DIAGNOSIS — K860 Alcohol-induced chronic pancreatitis: Secondary | ICD-10-CM | POA: Diagnosis not present

## 2021-08-12 DIAGNOSIS — K5900 Constipation, unspecified: Secondary | ICD-10-CM | POA: Diagnosis not present

## 2021-08-12 DIAGNOSIS — E43 Unspecified severe protein-calorie malnutrition: Secondary | ICD-10-CM | POA: Diagnosis not present

## 2021-08-12 DIAGNOSIS — I4891 Unspecified atrial fibrillation: Secondary | ICD-10-CM | POA: Diagnosis not present

## 2021-08-12 DIAGNOSIS — R630 Anorexia: Secondary | ICD-10-CM | POA: Diagnosis not present

## 2021-08-12 DIAGNOSIS — K449 Diaphragmatic hernia without obstruction or gangrene: Secondary | ICD-10-CM | POA: Diagnosis not present

## 2021-08-12 DIAGNOSIS — Z1159 Encounter for screening for other viral diseases: Secondary | ICD-10-CM | POA: Diagnosis not present

## 2021-08-12 DIAGNOSIS — M1712 Unilateral primary osteoarthritis, left knee: Secondary | ICD-10-CM | POA: Diagnosis not present

## 2021-08-12 DIAGNOSIS — R5381 Other malaise: Secondary | ICD-10-CM | POA: Diagnosis not present

## 2021-08-12 DIAGNOSIS — E119 Type 2 diabetes mellitus without complications: Secondary | ICD-10-CM | POA: Diagnosis not present

## 2021-08-12 DIAGNOSIS — I81 Portal vein thrombosis: Secondary | ICD-10-CM | POA: Diagnosis not present

## 2021-08-12 DIAGNOSIS — R2689 Other abnormalities of gait and mobility: Secondary | ICD-10-CM | POA: Diagnosis not present

## 2021-08-12 DIAGNOSIS — Z7901 Long term (current) use of anticoagulants: Secondary | ICD-10-CM | POA: Diagnosis not present

## 2021-08-12 DIAGNOSIS — K219 Gastro-esophageal reflux disease without esophagitis: Secondary | ICD-10-CM | POA: Diagnosis not present

## 2021-08-12 DIAGNOSIS — I951 Orthostatic hypotension: Secondary | ICD-10-CM | POA: Diagnosis not present

## 2021-08-12 DIAGNOSIS — I480 Paroxysmal atrial fibrillation: Secondary | ICD-10-CM | POA: Diagnosis not present

## 2021-08-12 DIAGNOSIS — E46 Unspecified protein-calorie malnutrition: Secondary | ICD-10-CM | POA: Diagnosis not present

## 2021-08-12 DIAGNOSIS — R197 Diarrhea, unspecified: Secondary | ICD-10-CM | POA: Diagnosis not present

## 2021-08-12 DIAGNOSIS — R1110 Vomiting, unspecified: Secondary | ICD-10-CM | POA: Diagnosis not present

## 2021-08-12 DIAGNOSIS — M6281 Muscle weakness (generalized): Secondary | ICD-10-CM | POA: Diagnosis not present

## 2021-08-12 DIAGNOSIS — R1030 Lower abdominal pain, unspecified: Secondary | ICD-10-CM | POA: Diagnosis not present

## 2021-08-12 DIAGNOSIS — F1011 Alcohol abuse, in remission: Secondary | ICD-10-CM | POA: Diagnosis not present

## 2021-08-12 DIAGNOSIS — R195 Other fecal abnormalities: Secondary | ICD-10-CM | POA: Diagnosis not present

## 2021-08-12 DIAGNOSIS — E1151 Type 2 diabetes mellitus with diabetic peripheral angiopathy without gangrene: Secondary | ICD-10-CM | POA: Diagnosis not present

## 2021-08-12 DIAGNOSIS — Z79899 Other long term (current) drug therapy: Secondary | ICD-10-CM | POA: Diagnosis not present

## 2021-08-12 DIAGNOSIS — Z811 Family history of alcohol abuse and dependence: Secondary | ICD-10-CM | POA: Diagnosis not present

## 2021-08-12 DIAGNOSIS — M25562 Pain in left knee: Secondary | ICD-10-CM | POA: Diagnosis not present

## 2021-08-12 DIAGNOSIS — R109 Unspecified abdominal pain: Secondary | ICD-10-CM | POA: Diagnosis not present

## 2021-08-12 DIAGNOSIS — R112 Nausea with vomiting, unspecified: Secondary | ICD-10-CM | POA: Diagnosis not present

## 2021-08-12 DIAGNOSIS — K915 Postcholecystectomy syndrome: Secondary | ICD-10-CM | POA: Diagnosis not present

## 2021-08-12 DIAGNOSIS — R42 Dizziness and giddiness: Secondary | ICD-10-CM | POA: Diagnosis not present

## 2021-08-12 DIAGNOSIS — G8918 Other acute postprocedural pain: Secondary | ICD-10-CM | POA: Diagnosis not present

## 2021-08-12 LAB — CBC
HCT: 26.1 % — ABNORMAL LOW (ref 39.0–52.0)
Hemoglobin: 8.8 g/dL — ABNORMAL LOW (ref 13.0–17.0)
MCH: 31.1 pg (ref 26.0–34.0)
MCHC: 33.7 g/dL (ref 30.0–36.0)
MCV: 92.2 fL (ref 80.0–100.0)
Platelets: 51 10*3/uL — ABNORMAL LOW (ref 150–400)
RBC: 2.83 MIL/uL — ABNORMAL LOW (ref 4.22–5.81)
RDW: 13.8 % (ref 11.5–15.5)
WBC: 4.6 10*3/uL (ref 4.0–10.5)
nRBC: 0 % (ref 0.0–0.2)

## 2021-08-12 MED ORDER — METOPROLOL TARTRATE 25 MG PO TABS: 12.5000 mg | ORAL_TABLET | Freq: Two times a day (BID) | ORAL | 1 refills | Status: AC

## 2021-08-12 MED ORDER — AMIODARONE HCL 200 MG PO TABS
200.0000 mg | ORAL_TABLET | Freq: Two times a day (BID) | ORAL | 0 refills | Status: DC
Start: 2021-08-12 — End: 2021-09-02

## 2021-08-12 MED ORDER — APIXABAN 5 MG PO TABS
5.0000 mg | ORAL_TABLET | Freq: Two times a day (BID) | ORAL | Status: DC
  Administered 2021-08-12: 5 mg via ORAL
  Filled 2021-08-12: qty 1

## 2021-08-12 NOTE — Progress Notes (Signed)
Patient discharged via [facility] by wheelchair to [facility] Room 104. Report called to [place] at [phone].

## 2021-08-12 NOTE — Progress Notes (Signed)
PROGRESS NOTE   Jon Bennett  [id number] DOB: 04/01/1934 DOA: 08/09/2021 PCP: Neale Burly, MD   Chief Complaint  Patient presents with   Abdominal Pain    Gallbladder surgery 3 days ago   Level of care: Stepdown  Brief Admission History:  87  y/o male well known to medical service from recent hospitalization for sepsis, bacteremia and ascending cholangitis.  He had to be transferred to [facility] for an ERCP with sphincterotomy and stone extraction.  He has a history of upper GI bleeding and Mallory-Weiss tear that was treated with Protonix.  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  He has chronic thrombocytopenia.  He has type 2 diabetes mellitus, hypertension, history of alcoholism, GERD.  He recovered from the ascending cholangitis and subsequently followed up with surgery and had a laparoscopic cholecystectomy done on 08/06/2021.  He was discharged in good condition on 08/07/2021.  He returned to ED on 08/09/2021 with right-sided abdominal pain and fevers.  He had blood cultures drawn and he was admitted by surgery for IV antibiotics and monitoring.  He has been stable on the floor however has developed sinus tachycardia with a heart rate jumping into the 130s.  Serial EKGs have been done and he seems to be in sinus tachycardia.  Fortunately his heart rate seems to drop back down into the 70 range but it goes back up into the 120s at rest.  The patient is totally asymptomatic from the heart rate changes.  He has been restarted on his metoprolol 25 mg twice daily and had morning dose today.    I spoke with Dr. Arnoldo Morale and he requested a medical consultation.  Pt is being transferred to stepdown ICU for closer monitoring given his advanced age and co-morbidities.    08/11/2021:  Pt converted to SR, off IV amiodarone and on oral amiodarone.  Enoxaparin stopped due to low platelets.   Bilirubin normalized. Per surgery ok to restart oral anticoagulation.     Assessment and Plan: * Postoperative abdominal pain -- pain improved, tolerating diet well,  -- his bilirubin is back down to normal limits.  -Discharged by general surgery -Follow-up on further management as per general surgery  Paroxysmal atrial fibrillation with RVR -- converted to SR after IV amio bolus, now off IV amio and on oral amiodarone -- discontinue enoxaparin due to thrombocytopenia -Cardiology consult appreciated -Discharge home on oral amiodarone as outlined, plan will be to probably come off amiodarone in about 6 weeks -Continue Eliquis in the setting of splenic/portal thrombosis and A-fib  Hypotension -- BP improved, stopped metoprolol -Discharged on midodrine   Social/ethics-- He has documents scanned into Epic for DNR status  ** Update: Pt requested to change his code status back to full code  -- pt has continued to decline with multiple recent hospitalizations and failing health, I have asked for a palliative consultation as I fear that coding him would cause this poor man undue pain and suffering with no meaningful recovery afterwards...however as he requested I have changed him back to full code status   History of alcohol abuse -- reports that he hasn't had a drink in months   Normocytic anemia -- hg trended down a bit today likely from IV fluids.  -- no signs of bleeding found -- follow with CBC in AM  Protein calorie malnutrition ([place]) -- continue heart healthy diet and supplements  Pressure injury of skin -- continue wound care protocol per nursing    Hypokalemia/hypomagnesemia and hypophosphatemia  -- replaced potassium, phos and magnesium   Chronic anticoagulation -- temporarily held apixaban in case of procedures needed  -- he was on subcutaneous enoxaparin temporarily but will discontinue due to low platelets -- discussed with surgery, no procedures anticipated  now Discharge on apixaban in the setting of splenic/portal thrombosis  Splenic/portal vein thrombosis -- pt is anticoagulated with apixaban   GERD (gastroesophageal reflux disease) -- Continue PPI  Type 2 diabetes mellitus (HCC) -- this has been diet controlled,   Thrombocytopenia (HCC) --- Repeat CBC with PCP and general surgeon  -no signs of bleeding found.  -- he has history of chronic alcoholism and chronic liver injury   Code Status: changed back to full code per patient request 5/28 Family Communication:  d/w with son in [place]   Procedures:   Antimicrobials:  Zosyn 5/28>>  Subjective:  -Tolerating oral intake well -No significant abdominal pain  Objective: Vitals:   08/12/21 0830 08/12/21 1100 08/12/21 1120 08/12/21 1200  BP:  (!) 105/50  109/72  Pulse:  (!) 53  69  Resp:  (!) 29  (!) 25  Temp: 98.6 F (37 C)  98.1 F (36.7 C)   TempSrc: Oral  Oral   SpO2:  96%  100%  Weight:      Height:        Intake/Output Summary (Last 24 hours) at 08/12/2021 1230 Last data filed at 08/12/2021 5329 Gross per 24 hour  Intake 1490.71 ml  Output 575 ml  Net 915.71 ml   Filed Weights   08/09/21 0707 08/09/21 1458 08/11/21 0454  Weight: 64.4 kg 65.8 kg 71.5 kg   Examination:  General exam: Alert and oriented, in no acute distress  respiratory system:-Fair symmetrical air movement, no wheezing Cardiovascular system: Appears back in sinus rhythm, regular, S1 and S2  gastrointestinal system:  soft, nontender, positive bowel sounds not distended  Central nervous system: Alert and oriented. No focal neurological deficits. Extremities: Symmetric 5 x 5 power. Skin: No rashes, lesions or ulcers. Psychiatry: Judgement and insight appear normal. Mood & affect appropriate.   Data Reviewed: I have personally reviewed following labs and imaging studies  CBC: Recent Labs  Lab 08/07/21 0932 08/09/21 0721 08/09/21 0811 08/10/21 0610 08/11/21 0350 08/12/21 0350   WBC 13.2* 5.3  --  4.7 4.5 4.6  NEUTROABS  --  4.3  --   --   --   --   HGB 12.3* 13.2 10.9* 10.3* 8.9* 8.8*  HCT 36.5* 38.8* 32.0* 30.2* 26.5* 26.1*  MCV 92.2 91.3  --  92.1 94.3 92.2  PLT 98* 145*  --  70* 50* 51*    Basic Metabolic Panel: Recent Labs  Lab 08/07/21 0546 08/09/21 0719 08/09/21 0721 08/09/21 0811 08/10/21 0610 08/11/21 0350  NA 137 139  --  138 134* 133*  K 4.6 3.4*  --  3.3* 4.4 4.7  CL 108 104  --  103 107 105  CO2 25 26  --   --  23 23  GLUCOSE 144* 166*  --  141* 123* 105*  BUN 16 15  --  15 10 9   CREATININE 0.75 0.77  --  0.60* 0.68 0.78  CALCIUM 8.8* 8.8*  --   --  7.8* 7.6*  MG  --   --  1.6*  --  2.0 1.9  PHOS  --   --   --   --  1.5* 2.7    CBG: Recent Labs  Lab 08/06/21 0800 08/10/21 0704  GLUCAP 140* 115*    Recent Results (from the past 240 hour(s))  Blood Culture (routine x 2)     Status: None (Preliminary result)   Collection Time: 08/09/21  7:21 AM   Specimen: Right Antecubital; Blood  Result Value Ref Range Status   Specimen Description   Final    RIGHT ANTECUBITAL BOTTLES DRAWN AEROBIC AND ANAEROBIC Performed at [facility], [address], [place], [place] [address]    Special Requests   Final    Blood Culture adequate volume Performed at [facility], [address] [place], [place] [address]    Culture  Setup Time   Final    GRAM NEGATIVE RODS ANAEROBIC BOTTLE ONLY CRITICAL RESULT CALLED TO, READ BACK BY AND VERIFIED WITH: RN EDGAR TINAJERO ON 08/11/21 @ 2306 BY DRT     Culture   Final    GRAM NEGATIVE RODS CULTURE REINCUBATED FOR BETTER GROWTH Performed at [facility], [place] [address], [place], [place] [address]    Report Status PENDING  Incomplete  Blood Culture ID Panel (Reflexed)     Status: None   Collection Time: 08/09/21  7:21 AM  Result Value Ref Range Status   Enterococcus faecalis NOT DETECTED NOT DETECTED Final   Enterococcus Faecium NOT DETECTED NOT DETECTED Final   Listeria monocytogenes NOT  DETECTED NOT DETECTED Final   Staphylococcus species NOT DETECTED NOT DETECTED Final   Staphylococcus aureus (BCID) NOT DETECTED NOT DETECTED Final   Staphylococcus epidermidis NOT DETECTED NOT DETECTED Final   Staphylococcus lugdunensis NOT DETECTED NOT DETECTED Final   Streptococcus species NOT DETECTED NOT DETECTED Final   Streptococcus agalactiae NOT DETECTED NOT DETECTED Final   Streptococcus pneumoniae NOT DETECTED NOT DETECTED Final   Streptococcus pyogenes NOT DETECTED NOT DETECTED Final   A.calcoaceticus-baumannii NOT DETECTED NOT DETECTED Final   Bacteroides fragilis NOT DETECTED NOT DETECTED Final   Enterobacterales NOT DETECTED NOT DETECTED Final   Enterobacter cloacae complex NOT DETECTED NOT DETECTED Final   Escherichia coli NOT DETECTED NOT DETECTED Final   Klebsiella aerogenes NOT DETECTED NOT DETECTED Final   Klebsiella oxytoca NOT DETECTED NOT DETECTED Final   Klebsiella pneumoniae NOT DETECTED NOT DETECTED Final   Proteus species NOT DETECTED NOT DETECTED Final   Salmonella species NOT DETECTED NOT DETECTED Final   Serratia marcescens NOT DETECTED NOT DETECTED Final   Haemophilus influenzae NOT DETECTED NOT DETECTED Final   Neisseria meningitidis NOT DETECTED NOT DETECTED Final   Pseudomonas aeruginosa NOT DETECTED NOT DETECTED Final   Stenotrophomonas maltophilia NOT DETECTED NOT DETECTED Final   Candida albicans NOT DETECTED NOT DETECTED Final   Candida auris NOT DETECTED NOT DETECTED Final   Candida glabrata NOT DETECTED NOT DETECTED Final   Candida krusei NOT DETECTED NOT DETECTED Final   Candida parapsilosis NOT DETECTED NOT DETECTED Final   Candida tropicalis NOT DETECTED NOT DETECTED Final   Cryptococcus neoformans/gattii NOT DETECTED NOT DETECTED Final    Comment: Performed at [facility], [address] [place], [place] [address]  Blood Culture (routine x 2)     Status: None (Preliminary result)   Collection Time: 08/09/21  7:57 AM   Specimen:  BLOOD LEFT WRIST  Result Value Ref Range Status   Specimen Description   Final    BLOOD LEFT WRIST BOTTLES DRAWN AEROBIC AND ANAEROBIC   Special Requests   Final    Blood Culture  adequate volume Performed at [facility], [address], [place], [place] [address]    Culture PENDING  Incomplete   Report Status PENDING  Incomplete  SARS Coronavirus 2 by RT PCR (hospital order, performed in [facility] hospital lab) *cepheid single result test* Anterior Nasal Swab     Status: None   Collection Time: 08/09/21 10:43 AM   Specimen: Anterior Nasal Swab  Result Value Ref Range Status   SARS Coronavirus 2 by RT PCR NEGATIVE NEGATIVE Final    Comment: (NOTE) SARS-CoV-2 target nucleic acids are NOT DETECTED.  The SARS-CoV-2 RNA is generally detectable in upper and lower respiratory specimens during the acute phase of infection. The lowest concentration of SARS-CoV-2 viral copies this assay can detect is 250 copies / mL. A negative result does not preclude SARS-CoV-2 infection and should not be used as the sole basis for treatment or other patient management decisions.  A negative result may occur with improper specimen collection / handling, submission of specimen other than nasopharyngeal swab, presence of viral mutation(s) within the areas targeted by this assay, and inadequate number of viral copies (<250 copies / mL). A negative result must be combined with clinical observations, patient history, and epidemiological information.  Fact Sheet for Patients:   https://www.patel.info/  Fact Sheet for Healthcare Providers: https://hall.com/  This test is not yet approved or  cleared by the [place] FDA and has been authorized for detection and/or diagnosis of SARS-CoV-2 by FDA under an Emergency Use Authorization (EUA).  This EUA will remain in effect (meaning this test can be used) for the duration of the COVID-19 declaration under Section  564(b)(1) of the Act, 21 U.S.C. section 360bbb-3(b)(1), unless the authorization is terminated or revoked sooner.  Performed at [facility], [address] [place], [place] [address]   MRSA Next Gen by PCR, Nasal     Status: None   Collection Time: 08/09/21  2:36 PM   Specimen: Nasal Mucosa; Nasal Swab  Result Value Ref Range Status   MRSA by PCR Next Gen NOT DETECTED NOT DETECTED Final    Comment: (NOTE) The GeneXpert MRSA Assay (FDA approved for NASAL specimens only), is one component of a comprehensive MRSA colonization surveillance program. It is not intended to diagnose MRSA infection nor to guide or monitor treatment for MRSA infections. Test performance is not FDA approved in patients less than 32 years old. Performed at [facility], [address] [place],  [address]      Radiology Studies: ECHOCARDIOGRAM COMPLETE  Result Date: 08/11/2021    ECHOCARDIOGRAM REPORT   Patient Name:   SIMRANJIT RAMBERG Date of Exam: 08/11/2021 Medical Rec #:  [id number]      Height:       69.0 in Accession #:    [id number]     Weight:       157.6 lb Date of Birth:  [birth year]      BSA:          1.867 m Patient Age:    24 years       BP:           87/41 mmHg Patient Gender: M              HR:           88 bpm. Exam Location:  [facility] Procedure: 2D Echo, Cardiac Doppler and Color Doppler Indications:    Atrial Fibrillation  History:        Patient has prior history  of Echocardiogram examinations, most                 recent 10/28/2019. Arrythmias:Atrial Fibrillation; Risk                 Factors:Hypertension and Diabetes.  Sonographer:    Wenda Low Referring Phys: [id number] [place]  1. Left ventricular ejection fraction, by estimation, is 60 to 65%. The left ventricle has normal function. The left ventricle has no regional wall motion abnormalities. There is mild left ventricular hypertrophy. Left ventricular diastolic parameters are indeterminate. Elevated left atrial  pressure.  2. Right ventricular systolic function is normal. The right ventricular size is normal.  3. The mitral valve is normal in structure. No evidence of mitral valve regurgitation. No evidence of mitral stenosis.  4. The tricuspid valve is abnormal.  5. The aortic valve is tricuspid. Aortic valve regurgitation is not visualized. No aortic stenosis is present. FINDINGS  Left Ventricle: Left ventricular ejection fraction, by estimation, is 60 to 65%. The left ventricle has normal function. The left ventricle has no regional wall motion abnormalities. The left ventricular internal cavity size was normal in size. There is  mild left ventricular hypertrophy. Left ventricular diastolic parameters are indeterminate. Elevated left atrial pressure. Right Ventricle: Indeterminant PASP, IVC not visualized. The right ventricular size is normal. No increase in right ventricular wall thickness. Right ventricular systolic function is normal. Left Atrium: Left atrial size was normal in size. Right Atrium: Right atrial size was normal in size. Pericardium: There is no evidence of pericardial effusion. Mitral Valve: The mitral valve is normal in structure. No evidence of mitral valve regurgitation. No evidence of mitral valve stenosis. MV peak gradient, 6.7 mmHg. The mean mitral valve gradient is 2.0 mmHg. Tricuspid Valve: The tricuspid valve is abnormal. Tricuspid valve regurgitation is mild . No evidence of tricuspid stenosis. Aortic Valve: The aortic valve is tricuspid. Aortic valve regurgitation is not visualized. No aortic stenosis is present. Aortic valve mean gradient measures 5.0 mmHg. Aortic valve peak gradient measures 9.7 mmHg. Aortic valve area, by VTI measures 2.88 cm. Pulmonic Valve: The pulmonic valve was not well visualized. Pulmonic valve regurgitation is not visualized. No evidence of pulmonic stenosis. Aorta: The aortic root is normal in size and structure. Venous: The inferior vena cava was not well  visualized. IAS/Shunts: The interatrial septum was not well visualized.  LEFT VENTRICLE PLAX 2D LVIDd:         3.90 cm     Diastology LVIDs:         2.40 cm     LV e' medial:    6.09 cm/s LV PW:         1.10 cm     LV E/e' medial:  17.4 LV IVS:        1.10 cm     LV e' lateral:   5.87 cm/s LVOT diam:     2.00 cm     LV E/e' lateral: 18.1 LV SV:         94 LV SV Index:   50 LVOT Area:     3.14 cm  LV Volumes (MOD) LV vol d, MOD A2C: 35.9 ml LV vol d, MOD A4C: 51.5 ml LV vol s, MOD A2C: 18.3 ml LV vol s, MOD A4C: 16.3 ml LV SV MOD A2C:     17.6 ml LV SV MOD A4C:     51.5 ml LV SV MOD BP:      27.2 ml  RIGHT VENTRICLE RV Basal diam:  4.10 cm RV Mid diam:    3.30 cm RV S prime:     21.50 cm/s TAPSE (M-mode): 2.6 cm LEFT ATRIUM             Index        RIGHT ATRIUM           Index LA diam:        3.80 cm 2.04 cm/m   RA Area:     18.60 cm LA Vol (A2C):   35.0 ml 18.74 ml/m  RA Volume:   51.00 ml  27.31 ml/m LA Vol (A4C):   72.4 ml 38.77 ml/m LA Biplane Vol: 51.2 ml 27.42 ml/m  AORTIC VALVE                     PULMONIC VALVE AV Area (Vmax):    2.68 cm      PV Vmax:       1.25 m/s AV Area (Vmean):   2.62 cm      PV Peak grad:  6.2 mmHg AV Area (VTI):     2.88 cm AV Vmax:           156.00 cm/s AV Vmean:          106.000 cm/s AV VTI:            0.325 m AV Peak Grad:      9.7 mmHg AV Mean Grad:      5.0 mmHg LVOT Vmax:         133.00 cm/s LVOT Vmean:        88.300 cm/s LVOT VTI:          0.298 m LVOT/AV VTI ratio: 0.92  AORTA Ao Root diam: 3.20 cm Ao Asc diam:  2.90 cm MITRAL VALVE                TRICUSPID VALVE MV Area (PHT): 4.06 cm     TR Peak grad:   35.0 mmHg MV Area VTI:   2.60 cm     TR Vmax:        296.00 cm/s MV Peak grad:  6.7 mmHg MV Mean grad:  2.0 mmHg     SHUNTS MV Vmax:       1.29 m/s     Systemic VTI:  0.30 m MV Vmean:      68.8 cm/s    Systemic Diam: 2.00 cm MV Decel Time: 187 msec MV E velocity: 106.00 cm/s MV A velocity: 80.90 cm/s MV E/A ratio:  1.31 Carlyle Dolly MD Electronically signed by  Carlyle Dolly MD Signature Date/Time: 08/11/2021/11:37:19 AM    Final     Scheduled Meds:  amiodarone  400 mg Oral BID   apixaban  5 mg Oral BID   Chlorhexidine Gluconate Cloth  6 each Topical Daily   feeding supplement  237 mL Oral TID BM   folic acid  1 mg Oral Daily   metoprolol tartrate  12.5 mg Oral BID   midodrine  5 mg Oral Q breakfast   multivitamin with minerals  1 tablet Oral Daily   pantoprazole  40 mg Oral Q1200   polyethylene glycol  17 g Oral BID   tamsulosin  0.4 mg Oral Daily   thiamine  100 mg Oral Daily   Continuous Infusions:  lactated ringers 50 mL/hr at 08/11/21 0928   piperacillin-tazobactam (ZOSYN)  IV 3.375 g (08/12/21 1227)     LOS: 2 days  Roxan Hockey, MD How to contact the [facility] Attending or Consulting provider [place] or covering provider during after hours [place], for this patient?  Check the care team in [facility] and look for a) attending/consulting [facility] provider listed and b) the [facility] team listed Log into [email or web address] and use [place]'s universal password to access. If you do not have the password, please contact the hospital operator. Locate the [facility] provider you are looking for under [facility] and page to a number that you can be directly reached. If you still have difficulty reaching the provider, please page the [facility] (Director on Call) for the Hospitalists listed on amion for assistance.  08/12/2021, 12:30 PM

## 2021-08-12 NOTE — Progress Notes (Signed)
Tele reviewed, remains in SR today. Would continue oral amio 400mg  bid x 7 then 200mg  bid x 2 weeks then 200mg  daily. He had already been on anticoag given prior histroy of splenic/portal thrombosis, should be on 5mg  bid will adjust dose.  Would plan to discontinue amio in roughly 6 weeks and monitor for recurrence of afib outside periop period.    We will sign off inpatient care and arrange outpatient f/u   MD

## 2021-08-12 NOTE — Consult Note (Addendum)
Consultation Note Date: 08/12/2021   Patient Name: Jon Bennett  DOB: 31-Jan-1935  MRN: [id number]  Age / Sex: 87 y.o., male  PCP: Neale Burly, MD Referring Physician: Roxan Hockey, MD  Reason for Consultation: Goals of care- code status discussion   HPI/Patient Profile: 87 y.o. male  with past medical history of recent cholecystectmy on 5/25, HTN, DM2 admitted on 08/09/2021 with for abdominal pain and fevers, treated with antibiotics with improvement. Admission complicated by a fib with RVR, started on amiodorone- he is on anticoagulation for splenic and portal vein thrombosis and this was restarted.  Palliative medicine consulted for goals of care- per Dr. Durenda Age note appears primary for code status discussion.    Primary Decision Maker PATIENT  Discussion: Chart reviewed including progress notes, imaging, labs.  He was seen by Palliative in April by Vinie Sill, NP- at that time he was confused and GOC and code status discussion was had with his son with determination for DNR and GOC being to proceed with surgery and return home. This admission patient expressed his desire to reverse code status to full code.  Today he is oriented x3, he is also aware of his medical situation. He is hoping to return to his home, where he lives with his son.  Prior to this admission he was living independently at home- he does note some decrease in his functional status since his surgery- but he is hoping this improves as he is able to heal and recover from infection. We discussed he is likely very deconditioned and he may need some therapy to get some of his reserve back. He would like to work towards being able to play golf. He last was able to play golf about 3 months ago.  We discussed code status- he states his desire is for full code status and resuscitation. When asked what the goal of CPR and life support  would be- he stated he would hope it would "keep me going". I shared with him my concern that if he declined further, to the point that his heart stopped and he stopped breathing- that full resuscitative efforts are not going to restore him to being able to play golf- that more than likely he would be left on life support indeterminately and his family would be the ones who would need to make decisions for him. He notes that he would not want to be on life support for extended amount of time if he had terminal illness- but he continues to endorse desire for full code status. He notes he has a son who is a physician and he has discussed his wishes with him.  I attempted to call patient's son- left a message.     SUMMARY OF RECOMMENDATIONS -Continue current plan of care -Full code- DNR form voided in [facility] -Addendum- I spoke with patient's son- encouraged family discussion regarding Code Status- patient's son agrees that CPR would not improve patient's quality of life    Code Status/Advance Care Planning: Full code  Prognosis:   Unable to determine  Discharge Planning: Home with Home Health  Primary Diagnoses: Present on Admission:  Postoperative abdominal pain  Hypokalemia  Hypomagnesemia  GERD (gastroesophageal reflux disease)  Hypertension  Pressure injury of skin  Splenic vein thrombosis  Normocytic anemia  Protein calorie malnutrition (HCC)  Thrombocytopenia (HCC)  History of alcohol abuse  DNR (do not resuscitate)  Atrial fibrillation ([place])  Hypophosphatemia   Review of Systems  Constitutional:  Positive for appetite change.   Physical Exam Vitals and nursing note reviewed.  Constitutional:      Comments: frail  Neurological:     Mental Status: He is alert.     Comments: Soft voice    Vital Signs: BP 132/68   Pulse 72   Temp 98.6 F (37 C) (Oral)   Resp (!) 23   Ht 5\' 9"  (1.753 m)   Wt 71.5 kg   SpO2 95%   BMI 23.28 kg/m  Pain Scale: 0-10 POSS *See Group  Information*: 1-Acceptable,Awake and alert Pain Score: 0-No pain   SpO2: SpO2: 95 % O2 Device:SpO2: 95 % O2 Flow Rate: .O2 Flow Rate (L/min): 2 L/min  IO: Intake/output summary:  Intake/Output Summary (Last 24 hours) at 08/12/2021 1035 Last data filed at 08/12/2021 0716 Gross per 24 hour  Intake 1490.71 ml  Output 575 ml  Net 915.71 ml    LBM: Last BM Date : 08/11/21 Baseline Weight: Weight: 64.4 kg Most recent weight: Weight: 71.5 kg       Thank you for this consult. Palliative medicine will continue to follow and assist as needed.   Greater than 50%  of this time was spent counseling and coordinating care related to the above assessment and plan.  Signed by: Mariana Kaufman, AGNP-C Palliative Medicine    Please contact Palliative Medicine Team phone at [phone] for questions and concerns.  For individual provider: See Shea Evans

## 2021-08-12 NOTE — Discharge Summary (Addendum)
Physician Discharge Summary  Patient ID: NYKO GELL MRN: [id number] DOB/AGE: 1934/10/30 87 y.o.  Admit date: 08/09/2021 Discharge date: 08/12/2021  Admission Diagnoses: Abdominal pain, failure to thrive  Discharge Diagnoses: Rapid atrial fibrillation Principal Problem:   Postoperative abdominal pain Active Problems:   Thrombocytopenia ([name]   GERD (gastroesophageal reflux disease)   Splenic vein thrombosis   Hypomagnesemia   Hypophosphatemia   Chronic anticoagulation   Hypokalemia   Pressure injury of skin   Protein calorie malnutrition (HCC)   Normocytic anemia   History of alcohol abuse   DNR (do not resuscitate)   Paroxysmal atrial fibrillation with RVR (HCC)   Hypotension   Atrial fibrillation [facility])   Discharged Condition: fair  Hospital Course: Patient is an 87 year old white male status post laparoscopic cholecystectomy on 08/06/2021 for cholelithiasis who was discharged home on 08/07/2021.  He read presented back to the emergency room on 08/09/2021 with abdominal pain, fevers, and failure to thrive.  A CT scan of the abdomen was for the most part unremarkable.  He was admitted to the hospital for further work-up of his abdominal pain and nausea.  He subsequently went into rapid atrial fibrillation and was transferred to the ICU.  Ultimately he required amiodarone drip to control his atrial fibrillation.  Cardiology was consulted.  He subsequently has converted back to normal sinus rhythm and has been put on p.o. amiodarone.  He did have a 2D echo which showed preserved ejection fraction.  His abdominal pain resolved.  His liver enzyme tests were within normal limits.  He has been restarted on his Eliquis.  His thrombocytopenia was probably secondary to Lovenox injections.  He is tolerating a regular diet well.  He is being discharged back to a nursing home in fair and stable condition.  Patient will need follow up CBC on 08/18/21  to assess thrombocytopenia.  Consults: cardiology and hospitalist  Discharge Exam: Blood pressure 132/68, pulse 72, temperature 98.6 F (37 C), temperature source Oral, resp. rate (!) 23, height 5\' 9"  (1.753 m), weight 71.5 kg, SpO2 95 %. General appearance: alert, cooperative, and no distress Resp: clear to auscultation bilaterally Cardio: regular rate and rhythm, S1, S2 normal, no murmur, click, rub or gallop GI: soft, non-tender; bowel sounds normal; no masses,  no organomegaly and well-healed surgical scars.  Disposition: Skilled Nursing  There are no questions and answers to display.         Allergies as of 08/12/2021       Reactions   Asa [aspirin] Shortness Of Breath        Medication List     STOP taking these medications    meloxicam 7.5 MG tablet Commonly known as: MOBIC       TAKE these medications    acamprosate 333 MG tablet Commonly known as: CAMPRAL Take 666 mg by mouth 3 (three) times daily.   acetaminophen 500 MG tablet Commonly known as: TYLENOL Take 500 mg by mouth daily as needed for mild pain or moderate pain.   amiodarone 200 MG tablet Commonly known as: PACERONE Take 1 tablet (200 mg total) by mouth 2 (two) times daily. Take 2 tablets twice a day for one week, then 1 tablet twice a day for 2 weeks, then 1 tablet daily.   bisacodyl 10 MG suppository Commonly known as: DULCOLAX Place 10 mg rectally daily as needed for mild constipation. Only use if Milk of Magnesia doesn't help   diclofenac  Sodium 1 % Gel Commonly known as: VOLTAREN Apply 4 g topically in the morning and at bedtime. Apply to left knee   Eliquis 5 MG Tabs tablet Generic drug: apixaban TAKE 1 TABLET BY MOUTH TWICE A DAY What changed: how much to take   feeding supplement Liqd Take 237 mLs by mouth 3 (three) times daily between meals.   folic acid 1 MG tablet Commonly known as: FOLVITE Take 1 tablet (1 mg total) by mouth daily.   metoprolol tartrate 25 MG  tablet Commonly known as: LOPRESSOR Take 0.5 tablets (12.5 mg total) by mouth 2 (two) times daily. What changed: how much to take   midodrine 5 MG tablet Commonly known as: PROAMATINE Take 5 mg by mouth daily as needed (SBP </=100).   MILK OF MAGNESIA PO Take 30 mLs by mouth daily as needed (constipation). If no BM in 3 days   multivitamin with minerals Tabs tablet Take 1 tablet by mouth daily.   pantoprazole 40 MG tablet Commonly known as: Protonix Take 1 tablet (40 mg total) by mouth 2 (two) times daily.   RA SALINE ENEMA RE Place 1 Dose rectally daily as needed (constipation). If not relieved by Biscodyl suppository   tamsulosin 0.4 MG Caps capsule Commonly known as: FLOMAX Take 0.4 mg by mouth daily.   thiamine 100 MG tablet Take 1 tablet (100 mg total) by mouth daily.        Follow-up Information     Ellsworth Lennox, PA-C Follow up on 09/02/2021.   Specialties: Physician Assistant, Cardiology Why: Cardiology Hospital Follow-up on 09/02/2021 at 2:30 PM. Contact information: [address] [place] [address] [phone]                 Signed: Franky Macho 08/12/2021, 10:34 AM

## 2021-08-12 NOTE — Progress Notes (Signed)
Physical Therapy Treatment Patient Details Name: Jon Bennett MRN: [id number] DOB: [birth year] Today's Date: 08/12/2021   History of Present Illness Patient is an 87 year old white male status post laparoscopic cholecystectomy on 08/06/2021, discharged 08/07/2021 who presents with right-sided abdominal pain and fevers.    PT Comments    Patient demonstrated slight improvement with bed mobility in requiring min assist from PT primarily due to UE weakness and poor trunk control. Patient is min assist with transfers with use of RW but still demonstrates balance deficits along with reliance of RW for maintaining upright position. Patient able to ambulate for 15 feet in room with min assist/guard provided by PT and use of RW. Patient able to ambulate for a few steps forward and backward but still continues to shuffle feet and present with B LE strength deficits impacting gait. Patient was able to tolerate therapeutic exercises provided but was limited due to pain at insertion point of catheter. Patient will benefit from continued skilled physical therapy in hospital and recommended venue below to increase strength, balance, endurance for safe ADLs and gait.    Recommendations for follow up therapy are one component of a multi-disciplinary discharge planning process, led by the attending physician.  Recommendations may be updated based on patient status, additional functional criteria and insurance authorization.  Follow Up Recommendations  Skilled nursing-short term rehab (<3 hours/day)     Assistance Recommended at Discharge Frequent or constant Supervision/Assistance  Patient can return home with the following A lot of help with walking and/or transfers;A lot of help with bathing/dressing/bathroom;Assistance with cooking/housework;Assist for transportation;Help with stairs or ramp for entrance   Equipment Recommendations  None recommended by PT    Recommendations for Other Services        Precautions / Restrictions Precautions Precautions: Fall Restrictions Weight Bearing Restrictions: No     Mobility  Bed Mobility Overal bed mobility: Needs Assistance Bed Mobility: Supine to Sit, Rolling Rolling: Min assist   Supine to sit: Min assist     General bed mobility comments: Patient was min assist with supine to sit but demonstrates UE weakness along with poor trunk control impacting ability to perform task independently.    Transfers Overall transfer level: Needs assistance Equipment used: Rolling walker (2 wheels) Transfers: Sit to/from Stand, Bed to chair/wheelchair/BSC Sit to Stand: Min assist   Step pivot transfers: Min assist       General transfer comment: Patient demonstrates slight improvement with transfers with need of min assist and use of RW. Patient still has balance deficits and reliance of RW.    Ambulation/Gait Ambulation/Gait assistance: Min assist Gait Distance (Feet): 15 Feet Assistive device: Rolling walker (2 wheels) Gait Pattern/deviations: Decreased step length - right, Decreased step length - left, Decreased stride length, Shuffle, Staggering right, Staggering left Gait velocity: decreased     General Gait Details: Patient min assist with gait and use of RW with ambulating in room forward and backwards 2x from chair. Patient continues to shuffle feet and demonstrate B LE weakness impacting gait and causing patient to perform labored steps during gait.   Stairs             Wheelchair Mobility    Modified Rankin (Stroke Patients Only)       Balance Overall balance assessment: Needs assistance Sitting-balance support: Feet supported, No upper extremity supported Sitting balance-Leahy Scale: Fair Sitting balance - Comments: Patient able to sit EOB with UE support   Standing balance support: Reliant on assistive device for balance, During  functional activity, Bilateral upper extremity supported Standing balance-Leahy  Scale: Poor Standing balance comment: Patient reliant on RW for gait and has difficulty when moving outside BOS.                            Cognition Arousal/Alertness: Awake/alert Behavior During Therapy: WFL for tasks assessed/performed Overall Cognitive Status: Within Functional Limits for tasks assessed                                          Exercises General Exercises - Lower Extremity Hip Flexion/Marching: Seated, AROM, Strengthening, Both, 15 reps Toe Raises: Seated, AROM, Strengthening, Both, 15 reps Heel Raises: Seated, AROM, Strengthening, Both, 15 reps    General Comments        Pertinent Vitals/Pain Pain Assessment Pain Assessment: No/denies pain    Home Living                          Prior Function            PT Goals (current goals can now be found in the care plan section) Acute Rehab PT Goals Patient Stated Goal: Return home PT Goal Formulation: With patient Time For Goal Achievement: 08/24/21 Potential to Achieve Goals: Good Progress towards PT goals: Progressing toward goals    Frequency    Min 3X/week      PT Plan Current plan remains appropriate    Co-evaluation              AM-PAC PT "6 Clicks" Mobility   Outcome Measure  Help needed turning from your back to your side while in a flat bed without using bedrails?: A Little Help needed moving from lying on your back to sitting on the side of a flat bed without using bedrails?: A Little Help needed moving to and from a bed to a chair (including a wheelchair)?: A Little Help needed standing up from a chair using your arms (e.g., wheelchair or bedside chair)?: A Little Help needed to walk in hospital room?: A Lot Help needed climbing 3-5 steps with a railing? : A Lot 6 Click Score: 16    End of Session   Activity Tolerance: Patient tolerated treatment well;Patient limited by fatigue Patient left: in chair;with call bell/phone within  reach Nurse Communication: Mobility status PT Visit Diagnosis: Unsteadiness on feet (R26.81);Other abnormalities of gait and mobility (R26.89);Muscle weakness (generalized) (M62.81)     Time: 5465-0354 PT Time Calculation (min) (ACUTE ONLY): 29 min  Charges:  $Therapeutic Exercise: 8-22 mins $Therapeutic Activity: 8-22 mins                    2:52 PM, 08/12/21 Arther Abbott, S/PT

## 2021-08-12 NOTE — Care Management Important Message (Signed)
Important Message  Patient Details  Name: Jon Bennett MRN: [id number] Date of Birth: 1934-05-19   Medicare Important Message Given:  N/A - LOS <3 / Initial given by admissions     Tommy Medal 08/12/2021, 11:36 AM

## 2021-08-12 NOTE — TOC Transition Note (Signed)
Transition of Care [facility]) - CM/SW Discharge Note   Patient Details  Name: Jon Bennett MRN: [id number] Date of Birth: Oct 28, 1934  [facility] of [facility]) CM/SW Contact:  Salome Arnt, LCSW Phone Number: 08/12/2021, 12:10 PM   Clinical Narrative:  Pt d/c today to SNF. Pt deferred SNF choice to son, [name] who chooses [facility] Authorization received: [id number] starting May 31 through June 2. D/C summary sent to SNF. RN given number to call report. Discussed transport with son who agrees to [employer]. TOC arranged.         Final next level of care: Skilled Nursing Facility Barriers to Discharge: Barriers Resolved   Patient Goals and CMS Choice Patient states their goals for this hospitalization and ongoing recovery are:: go home CMS Medicare.gov Compare Post Acute Care list provided to:: Patient Choice offered to / list presented to : Patient  Discharge Placement              Patient chooses bed at: [facility] Patient to be transferred to facility by: [facility] Name of family member notified: [name]- son Patient and family notified of of transfer: 08/12/21  Discharge Plan and Services In-house Referral: Clinical Social Work   Post Acute Care Choice: [facility]                               Social Determinants of Health ([place]) Interventions     Readmission Risk Interventions    07/03/2021    2:10 PM 06/26/2021    3:09 PM 08/27/2020   11:33 AM  Readmission Risk Prevention Plan  Transportation Screening Complete Complete Complete  PCP or Specialist Appt within 5-7 Days Complete  Complete  Home Care Screening Complete  Complete  Medication Review (RN CM) Complete  Complete  HRI or Home Care Consult  Complete   Palliative Care Screening  Not Applicable   Medication Review (RN Care Manager)  Complete

## 2021-08-13 DIAGNOSIS — N401 Enlarged prostate with lower urinary tract symptoms: Secondary | ICD-10-CM | POA: Diagnosis not present

## 2021-08-13 DIAGNOSIS — Z811 Family history of alcohol abuse and dependence: Secondary | ICD-10-CM | POA: Diagnosis not present

## 2021-08-13 DIAGNOSIS — K219 Gastro-esophageal reflux disease without esophagitis: Secondary | ICD-10-CM | POA: Diagnosis not present

## 2021-08-13 DIAGNOSIS — I10 Essential (primary) hypertension: Secondary | ICD-10-CM | POA: Diagnosis not present

## 2021-08-13 DIAGNOSIS — E46 Unspecified protein-calorie malnutrition: Secondary | ICD-10-CM | POA: Diagnosis not present

## 2021-08-13 DIAGNOSIS — K860 Alcohol-induced chronic pancreatitis: Secondary | ICD-10-CM | POA: Diagnosis not present

## 2021-08-13 DIAGNOSIS — K915 Postcholecystectomy syndrome: Secondary | ICD-10-CM | POA: Diagnosis not present

## 2021-08-13 LAB — CULTURE, BLOOD (ROUTINE X 2): Special Requests: ADEQUATE

## 2021-08-17 DIAGNOSIS — K860 Alcohol-induced chronic pancreatitis: Secondary | ICD-10-CM | POA: Diagnosis not present

## 2021-08-17 DIAGNOSIS — K915 Postcholecystectomy syndrome: Secondary | ICD-10-CM | POA: Diagnosis not present

## 2021-08-17 DIAGNOSIS — K219 Gastro-esophageal reflux disease without esophagitis: Secondary | ICD-10-CM | POA: Diagnosis not present

## 2021-08-17 DIAGNOSIS — E46 Unspecified protein-calorie malnutrition: Secondary | ICD-10-CM | POA: Diagnosis not present

## 2021-08-19 DIAGNOSIS — K219 Gastro-esophageal reflux disease without esophagitis: Secondary | ICD-10-CM | POA: Diagnosis not present

## 2021-08-19 DIAGNOSIS — E46 Unspecified protein-calorie malnutrition: Secondary | ICD-10-CM | POA: Diagnosis not present

## 2021-08-19 DIAGNOSIS — K915 Postcholecystectomy syndrome: Secondary | ICD-10-CM | POA: Diagnosis not present

## 2021-08-19 DIAGNOSIS — R112 Nausea with vomiting, unspecified: Secondary | ICD-10-CM | POA: Diagnosis not present

## 2021-08-19 LAB — CULTURE, BLOOD (ROUTINE X 2)
Culture: NO GROWTH
Special Requests: ADEQUATE

## 2021-08-20 DIAGNOSIS — K915 Postcholecystectomy syndrome: Secondary | ICD-10-CM | POA: Diagnosis not present

## 2021-08-20 DIAGNOSIS — R112 Nausea with vomiting, unspecified: Secondary | ICD-10-CM | POA: Diagnosis not present

## 2021-08-20 DIAGNOSIS — K5900 Constipation, unspecified: Secondary | ICD-10-CM | POA: Diagnosis not present

## 2021-08-21 DIAGNOSIS — N401 Enlarged prostate with lower urinary tract symptoms: Secondary | ICD-10-CM | POA: Diagnosis not present

## 2021-08-21 DIAGNOSIS — Z811 Family history of alcohol abuse and dependence: Secondary | ICD-10-CM | POA: Diagnosis not present

## 2021-08-21 DIAGNOSIS — I10 Essential (primary) hypertension: Secondary | ICD-10-CM | POA: Diagnosis not present

## 2021-08-21 DIAGNOSIS — K860 Alcohol-induced chronic pancreatitis: Secondary | ICD-10-CM | POA: Diagnosis not present

## 2021-08-24 DIAGNOSIS — R112 Nausea with vomiting, unspecified: Secondary | ICD-10-CM | POA: Diagnosis not present

## 2021-08-24 DIAGNOSIS — R197 Diarrhea, unspecified: Secondary | ICD-10-CM | POA: Diagnosis not present

## 2021-08-24 DIAGNOSIS — R630 Anorexia: Secondary | ICD-10-CM | POA: Diagnosis not present

## 2021-08-24 DIAGNOSIS — K915 Postcholecystectomy syndrome: Secondary | ICD-10-CM | POA: Diagnosis not present

## 2021-08-25 DIAGNOSIS — R630 Anorexia: Secondary | ICD-10-CM | POA: Diagnosis not present

## 2021-08-25 DIAGNOSIS — R197 Diarrhea, unspecified: Secondary | ICD-10-CM | POA: Diagnosis not present

## 2021-08-25 DIAGNOSIS — K915 Postcholecystectomy syndrome: Secondary | ICD-10-CM | POA: Diagnosis not present

## 2021-08-25 DIAGNOSIS — E46 Unspecified protein-calorie malnutrition: Secondary | ICD-10-CM | POA: Diagnosis not present

## 2021-08-27 ENCOUNTER — Emergency Department (HOSPITAL_COMMUNITY)
Admission: EM | Admit: 2021-08-27 | Discharge: 2021-08-27 | Disposition: A | Discharge status: Rehabilitation Facility | Payer: Medicare Other | Attending: Emergency Medicine | Admitting: Emergency Medicine

## 2021-08-27 ENCOUNTER — Emergency Department (HOSPITAL_COMMUNITY): Payer: Medicare Other

## 2021-08-27 DIAGNOSIS — R112 Nausea with vomiting, unspecified: Secondary | ICD-10-CM

## 2021-08-27 DIAGNOSIS — I10 Essential (primary) hypertension: Secondary | ICD-10-CM | POA: Diagnosis not present

## 2021-08-27 DIAGNOSIS — R1030 Lower abdominal pain, unspecified: Secondary | ICD-10-CM | POA: Insufficient documentation

## 2021-08-27 DIAGNOSIS — E119 Type 2 diabetes mellitus without complications: Secondary | ICD-10-CM | POA: Diagnosis not present

## 2021-08-27 DIAGNOSIS — R197 Diarrhea, unspecified: Secondary | ICD-10-CM

## 2021-08-27 DIAGNOSIS — I4891 Unspecified atrial fibrillation: Secondary | ICD-10-CM | POA: Diagnosis not present

## 2021-08-27 DIAGNOSIS — Z7901 Long term (current) use of anticoagulants: Secondary | ICD-10-CM | POA: Insufficient documentation

## 2021-08-27 DIAGNOSIS — R630 Anorexia: Secondary | ICD-10-CM | POA: Diagnosis not present

## 2021-08-27 DIAGNOSIS — R1110 Vomiting, unspecified: Secondary | ICD-10-CM | POA: Diagnosis not present

## 2021-08-27 DIAGNOSIS — Z79899 Other long term (current) drug therapy: Secondary | ICD-10-CM | POA: Diagnosis not present

## 2021-08-27 DIAGNOSIS — K760 Fatty (change of) liver, not elsewhere classified: Secondary | ICD-10-CM | POA: Diagnosis not present

## 2021-08-27 LAB — URINALYSIS, ROUTINE W REFLEX MICROSCOPIC
Bilirubin Urine: NEGATIVE
Glucose, UA: NEGATIVE mg/dL
Hgb urine dipstick: NEGATIVE
Ketones, ur: NEGATIVE mg/dL
Leukocytes,Ua: NEGATIVE
Nitrite: NEGATIVE
Protein, ur: NEGATIVE mg/dL
Specific Gravity, Urine: 1.029 (ref 1.005–1.030)
pH: 6 (ref 5.0–8.0)

## 2021-08-27 LAB — COMPREHENSIVE METABOLIC PANEL
ALT: 14 U/L (ref 0–44)
AST: 23 U/L (ref 15–41)
Albumin: 2.4 g/dL — ABNORMAL LOW (ref 3.5–5.0)
Alkaline Phosphatase: 76 U/L (ref 38–126)
Anion gap: 7 (ref 5–15)
BUN: 14 mg/dL (ref 8–23)
CO2: 25 mmol/L (ref 22–32)
Calcium: 8.5 mg/dL — ABNORMAL LOW (ref 8.9–10.3)
Chloride: 104 mmol/L (ref 98–111)
Creatinine, Ser: 1.08 mg/dL (ref 0.61–1.24)
GFR, Estimated: >60 mL/min (ref >60)
Glucose, Bld: 133 mg/dL — ABNORMAL HIGH (ref 70–99)
Potassium: 4.4 mmol/L (ref 3.5–5.1)
Sodium: 136 mmol/L (ref 135–145)
Total Bilirubin: 0.8 mg/dL (ref 0.3–1.2)
Total Protein: 6.8 g/dL (ref 6.5–8.1)

## 2021-08-27 LAB — CBC WITH DIFFERENTIAL/PLATELET
Abs Immature Granulocytes: 0.05 10*3/uL (ref 0.00–0.07)
Basophils Absolute: 0 10*3/uL (ref 0.0–0.1)
Basophils Relative: 1 %
Eosinophils Absolute: 0.1 10*3/uL (ref 0.0–0.5)
Eosinophils Relative: 1 %
HCT: 33.1 % — ABNORMAL LOW (ref 39.0–52.0)
Hemoglobin: 10.8 g/dL — ABNORMAL LOW (ref 13.0–17.0)
Immature Granulocytes: 1 %
Lymphocytes Relative: 21 %
Lymphs Abs: 1.5 10*3/uL (ref 0.7–4.0)
MCH: 30.8 pg (ref 26.0–34.0)
MCHC: 32.6 g/dL (ref 30.0–36.0)
MCV: 94.3 fL (ref 80.0–100.0)
Monocytes Absolute: 0.7 10*3/uL (ref 0.1–1.0)
Monocytes Relative: 9 %
Neutro Abs: 5.1 10*3/uL (ref 1.7–7.7)
Neutrophils Relative %: 67 %
Platelets: 363 10*3/uL (ref 150–400)
RBC: 3.51 MIL/uL — ABNORMAL LOW (ref 4.22–5.81)
RDW: 14.5 % (ref 11.5–15.5)
WBC: 7.4 10*3/uL (ref 4.0–10.5)
nRBC: 0 % (ref 0.0–0.2)

## 2021-08-27 LAB — LIPASE, BLOOD: Lipase: 19 U/L (ref 11–51)

## 2021-08-27 MED ORDER — LACTATED RINGERS IV BOLUS
1000.0000 mL | Freq: Once | INTRAVENOUS | Status: AC
  Administered 2021-08-27: 1000 mL via INTRAVENOUS

## 2021-08-27 MED ORDER — ONDANSETRON HCL 4 MG/2ML IJ SOLN
4.0000 mg | Freq: Once | INTRAMUSCULAR | Status: AC
  Administered 2021-08-27: 4 mg via INTRAVENOUS
  Filled 2021-08-27: qty 2

## 2021-08-27 MED ORDER — ONDANSETRON 4 MG PO TBDP: 4.0000 mg | ORAL_TABLET | Freq: Three times a day (TID) | ORAL | 0 refills | Status: DC | PRN

## 2021-08-27 MED ORDER — IOHEXOL 300 MG/ML  SOLN
100.0000 mL | Freq: Once | INTRAMUSCULAR | Status: AC | PRN
  Administered 2021-08-27: 100 mL via INTRAVENOUS

## 2021-08-27 NOTE — ED Provider Notes (Signed)
[facility]-EMERGENCY DEPT Provider Note   CSN: [id number] Arrival date & time: 08/27/21  1749     History  Chief Complaint  Patient presents with   Dizziness    RHYAN RADLER is a 87 y.o. male.  HPI     87 year old male with history of diabetes, hypertension, splenic vein thrombosis, portal vein thrombosis, acute alcoholic pancreatitis, hypertension, CVA, atrial fibrillation, who presents with concern for weight loss, vomiting and diarrhea.  He was admitted to the hospital May 28 with concern for abdominal pain, fevers, and failure to thrive in the setting of having a laparoscopic cholecystectomy on May 25, and during this hospitalization went into atrial fibrillation with RVR.   He reports he has been generally unwell since his initial surgery.  For the last week he has had nausea and vomiting, as well as diarrhea daily.  Reports he vomits approximately twice per day, and has about 3 episodes of diarrhea.  He denies any black or bloody stools.  He reports more continuous nausea and difficulty eating due to this.  He has lost 20 pounds per report over the last 2 weeks.  He denies fevers, chest pain, shortness of breath.  Reports intermittent dysuria. Mild lower abdominal pain. Reports that he had 1 episode of lightheadedness when he went from sitting to standing to get on the EMS stretcher to come here.  Reports he has not been able to work with physical therapy much since going to [facility]. No headaches. No falls.  Past Medical History:  Diagnosis Date   Diabetes mellitus without complication (HCC)    Patient is not on any medication for this   GERD (gastroesophageal reflux disease)    Hypertension    Pancreatitis, alcoholic, acute    Portal vein thrombosis 07/01/2021   Splenic vein thrombosis    Stroke [facility])    mini strokes   f Home Medications Prior to Admission medications   Medication Sig Start Date End Date Taking? Authorizing Provider   acamprosate (CAMPRAL) 333 MG tablet Take 666 mg by mouth 3 (three) times daily. 07/12/21  Yes [provider]  acetaminophen (TYLENOL) 500 MG tablet Take 500 mg by mouth daily as needed for mild pain or moderate pain.   Yes [provider]  amiodarone (PACERONE) 200 MG tablet Take 1 tablet (200 mg total) by mouth 2 (two) times daily. Take 2 tablets twice a day for one week, then 1 tablet twice a day for 2 weeks, then 1 tablet daily. 08/12/21  Yes Franky Macho, MD  bisacodyl (DULCOLAX) 10 MG suppository Place 10 mg rectally daily as needed for mild constipation. Only use if Milk of Magnesia doesn't help   Yes [provider]  diclofenac Sodium (VOLTAREN) 1 % GEL Apply 4 g topically in the morning and at bedtime. Apply to left knee   Yes [provider]  ELIQUIS 5 MG TABS tablet TAKE 1 TABLET BY MOUTH TWICE A DAY Patient taking differently: Take 5 mg by mouth 2 (two) times daily. 05/20/21  Yes Doreatha Massed, MD  feeding supplement (ENSURE ENLIVE / ENSURE PLUS) LIQD Take 237 mLs by mouth 3 (three) times daily between meals. 07/08/21  Yes Ghimire, Werner Lean, MD  folic acid (FOLVITE) 1 MG tablet Take 1 tablet (1 mg total) by mouth daily. 07/09/21  Yes Ghimire, Werner Lean, MD  Magnesium Hydroxide (MILK OF MAGNESIA PO) Take 30 mLs by mouth daily as needed (constipation). If no BM in 3 days  Yes [provider]  metoprolol tartrate (LOPRESSOR) 25 MG tablet Take 0.5 tablets (12.5 mg total) by mouth 2 (two) times daily. 08/12/21  Yes Aviva Signs, MD  midodrine (PROAMATINE) 5 MG tablet Take 5 mg by mouth daily as needed (SBP </=100).   Yes [provider]  mirtazapine (REMERON) 15 MG tablet Take 15 mg by mouth at bedtime.   Yes [provider]  Multiple Vitamin (MULTIVITAMIN WITH MINERALS) TABS tablet Take 1 tablet by mouth daily. 07/09/21  Yes Ghimire, Henreitta Leber, MD  ondansetron (ZOFRAN) 4 MG tablet Take 4 mg by mouth every 8 (eight) hours as  needed for nausea or vomiting.   Yes [provider]  ondansetron (ZOFRAN-ODT) 4 MG disintegrating tablet Take 1 tablet (4 mg total) by mouth every 8 (eight) hours as needed for nausea or vomiting. 08/27/21  Yes Gareth Morgan, MD  pantoprazole (PROTONIX) 40 MG tablet Take 1 tablet (40 mg total) by mouth 2 (two) times daily. 07/08/21  Yes Ghimire, Henreitta Leber, MD  tamsulosin (FLOMAX) 0.4 MG CAPS capsule Take 0.4 mg by mouth daily. 06/09/21  Yes [provider]  thiamine 100 MG tablet Take 1 tablet (100 mg total) by mouth daily. 08/28/20  Yes Manuella Ghazi, Pratik D, DO      Allergies    Asa [aspirin]    Review of Systems   Review of Systems  Physical Exam Updated Vital Signs BP (!) 119/95   Pulse 93   Temp 97.9 F (36.6 C) (Oral)   Resp 18   SpO2 98%  Physical Exam Vitals and nursing note reviewed.  Constitutional:      General: He is not in acute distress.    Appearance: He is well-developed. He is not diaphoretic.  HENT:     Head: Normocephalic and atraumatic.  Eyes:     Conjunctiva/sclera: Conjunctivae normal.  Cardiovascular:     Rate and Rhythm: Normal rate and regular rhythm.     Heart sounds: Normal heart sounds. No murmur heard.    No friction rub. No gallop.  Pulmonary:     Effort: Pulmonary effort is normal. No respiratory distress.     Breath sounds: Normal breath sounds. No wheezing or rales.  Abdominal:     General: There is no distension.     Palpations: Abdomen is soft.     Tenderness: There is abdominal tenderness (suprapubic). There is no guarding.  Musculoskeletal:     Cervical back: Normal range of motion.  Skin:    General: Skin is warm and dry.  Neurological:     Mental Status: He is alert and oriented to person, place, and time.     ED Results / Procedures / Treatments   Labs (all labs ordered are listed, but only abnormal results are displayed) Labs Reviewed  URINALYSIS, ROUTINE W REFLEX MICROSCOPIC - Abnormal; Notable for the  following components:      Result Value   APPearance HAZY (*)    All other components within normal limits  CBC WITH DIFFERENTIAL/PLATELET - Abnormal; Notable for the following components:   RBC 3.51 (*)    Hemoglobin 10.8 (*)    HCT 33.1 (*)    All other components within normal limits  COMPREHENSIVE METABOLIC PANEL - Abnormal; Notable for the following components:   Glucose, Bld 133 (*)    Calcium 8.5 (*)    Albumin 2.4 (*)    All other components within normal limits  URINE CULTURE  LIPASE, BLOOD    EKG None  Radiology CT ABDOMEN PELVIS W CONTRAST  Result Date: 08/27/2021 CLINICAL DATA:  Unintentional weight loss, vomiting, diarrhea. Bowel obstruction. EXAM: CT ABDOMEN AND PELVIS WITH CONTRAST TECHNIQUE: Multidetector CT imaging of the abdomen and pelvis was performed using the standard protocol following bolus administration of intravenous contrast. RADIATION DOSE REDUCTION: This exam was performed according to the departmental dose-optimization program which includes automated exposure control, adjustment of the mA and/or kV according to patient size and/or use of iterative reconstruction technique. CONTRAST:  OMNIPAQUE IOHEXOL 300 MG/ML  SOLN COMPARISON:  08/09/2021 FINDINGS: Lower chest: The visualized lung bases are clear. Visualized heart and pericardium are unremarkable. Large hiatal hernia. Hepatobiliary: Mild hepatic steatosis. Status post cholecystectomy. Interval decrease in size of postoperative gas and fluid collection within the gallbladder fossa now measuring 2.6 x 4.8 cm at axial image # 22/2. No intra or extrahepatic biliary ductal dilation. 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 The pancreas is markedly atrophic, unchanged from a prior examination with relative preservation of the uncinate process. No peripancreatic inflammatory change or loculated fluid collections are identified. No parenchymal calcifications are  seen. Spleen: Unremarkable Adrenals/Urinary Tract: The adrenal glands are unremarkable. The kidneys are normal in position. Mild bilateral renal cortical atrophy, unchanged. Scattered cortical hypodensities are too small to accurately characterize but likely represent multiple small cortical cysts. These are better assessed on MRI examination of 07/01/2021. No follow-up imaging is recommended for these lesions. The kidneys are otherwise unremarkable. The bladder is unremarkable Stomach/Bowel: Intra-abdominal stomach is within normal limits. Appendix absent. No evidence of bowel wall thickening, distention, or inflammatory changes. Vascular/Lymphatic: Aortic atherosclerosis. No enlarged abdominal or pelvic lymph nodes. Reproductive: Brachytherapy seeds are seen within the prostate gland. Prostate gland is of normal size. Other: No abdominal wall hernia Musculoskeletal: Osseous structures are age-appropriate. No lytic or blastic bone lesion. No acute bone abnormality IMPRESSION: 1. No acute intra-abdominal pathology identified. No definite radiographic explanation for the patient's reported symptoms. 2. Status post cholecystectomy. Interval decrease in size of postoperative gas and fluid collection within the gallbladder fossa. 3. Mild hepatic steatosis. 4. Large hiatal hernia. Aortic Atherosclerosis (ICD10-I70.0). Electronically Signed   By: Helyn Numbers M.D.   On: 08/27/2021 20:44    Procedures Procedures    Medications Ordered in ED Medications  lactated ringers bolus 1,000 mL (0 mLs Intravenous Stopped 08/27/21 2359)  ondansetron (ZOFRAN) injection 4 mg (4 mg Intravenous Given 08/27/21 1942)  iohexol (OMNIPAQUE) 300 MG/ML solution 100 mL (100 mLs Intravenous Contrast Given 08/27/21 2023)    ED Course/ Medical Decision Making/ A&P                           Medical Decision Making Amount and/or Complexity of Data Reviewed Labs: ordered. Radiology: ordered.  Risk Prescription drug  management.   87 year old male with history of diabetes, hypertension, splenic vein thrombosis, portal vein thrombosis, acute alcoholic pancreatitis, hypertension, CVA, atrial fibrillation, who presents with concern for weight loss, vomiting and diarrhea.  He was admitted to the hospital May 28 with concern for abdominal pain, fevers, and failure to thrive in the setting of having a laparoscopic cholecystectomy on May 25, and during this hospitalization went into atrial fibrillation with RVR.   Differential diagnosis for generalized weakness, nausea, vomiting and abdominal pain includes anemia, electrolyte abnormalities, pancreatitis, hepatitis, small bowel obstruction, intra-abdominal abscess, urinary tract infection.  Does not have chest pain or shortness of breath or headache to indicate intrathoracic or  intracranial etiology of symptoms.  Labs completed and personally evaluated and interpreted by me show no sign of pancreatitis, transaminitis, leukocytosis.    CT abdomen pelvis without significant changes, decrease in postoperative gas and fluid collection. UA without infection. Given rx for zofran.  Recommend continued outpatient follow up.          Final Clinical Impression(s) / ED Diagnoses Final diagnoses:  Nausea and vomiting, unspecified vomiting type  Diarrhea, unspecified type  Decreased appetite    Rx / DC Orders ED Discharge Orders          Ordered    ondansetron (ZOFRAN-ODT) 4 MG disintegrating tablet  Every 8 hours PRN        08/27/21 2302              Gareth Morgan, MD 08/28/21 [phone]

## 2021-08-27 NOTE — ED Triage Notes (Signed)
[facility] brings pt in from [facility] for dizziness. Denies fall. Staff reports pt has lost 20 pounds over the past 2 weeks. Vomiting and diarrhea today.

## 2021-08-27 NOTE — ED Notes (Signed)
Report given to [facility]. Transportation arrangements made for pt.

## 2021-08-28 DIAGNOSIS — R630 Anorexia: Secondary | ICD-10-CM | POA: Diagnosis not present

## 2021-08-28 DIAGNOSIS — R634 Abnormal weight loss: Secondary | ICD-10-CM | POA: Diagnosis not present

## 2021-08-28 DIAGNOSIS — R627 Adult failure to thrive: Secondary | ICD-10-CM | POA: Diagnosis not present

## 2021-08-28 DIAGNOSIS — R197 Diarrhea, unspecified: Secondary | ICD-10-CM | POA: Diagnosis not present

## 2021-08-29 LAB — URINE CULTURE

## 2021-08-31 DIAGNOSIS — M6281 Muscle weakness (generalized): Secondary | ICD-10-CM | POA: Diagnosis not present

## 2021-08-31 DIAGNOSIS — R112 Nausea with vomiting, unspecified: Secondary | ICD-10-CM | POA: Diagnosis not present

## 2021-08-31 DIAGNOSIS — R197 Diarrhea, unspecified: Secondary | ICD-10-CM | POA: Diagnosis not present

## 2021-08-31 DIAGNOSIS — R627 Adult failure to thrive: Secondary | ICD-10-CM | POA: Diagnosis not present

## 2021-09-01 ENCOUNTER — Ambulatory Visit (INDEPENDENT_AMBULATORY_CARE_PROVIDER_SITE_OTHER): Payer: Medicare Other | Admitting: Nurse Practitioner

## 2021-09-01 ENCOUNTER — Encounter: Payer: Self-pay | Admitting: Nurse Practitioner

## 2021-09-01 VITALS — BP 90/60 | HR 68

## 2021-09-01 DIAGNOSIS — R112 Nausea with vomiting, unspecified: Secondary | ICD-10-CM | POA: Diagnosis not present

## 2021-09-01 DIAGNOSIS — R195 Other fecal abnormalities: Secondary | ICD-10-CM | POA: Diagnosis not present

## 2021-09-01 NOTE — Patient Instructions (Addendum)
If you are age 85 or older, your body mass index should be between 23-30. Your There is no height or weight on file to calculate BMI. If this is out of the aforementioned range listed, please consider follow up with your Primary Care Provider.  If you are age 63 or younger, your body mass index should be between 19-25. Your There is no height or weight on file to calculate BMI. If this is out of the aformentioned range listed, please consider follow up with your Primary Care Provider.   ________________________________________________________  The [facility] providers would like to encourage you to use [facility] to communicate with providers for non-urgent requests or questions.  Due to long hold times on the telephone, sending your provider a message by [facility] may be a faster and more efficient way to get a response.  Please allow 48 business hours for a response.  Please remember that this is for non-urgent requests.  _______________________________________________________  Please collect stool for Clostridioides difficile. We have given an order form for this stool lab to this patient. Please fax back results to [phone] Attn Willette Cluster, NP-C. If there are any questions please call [phone] regarding this stool.  Continue Zofran ODT 4mg  every 8 hours as needed.  Follow up appointment is 09-30-2021 at 9:30am with Dr 10-02-2021. Please arrive at 9:15 for check in.  Thank you,  Chales Abrahams, NP

## 2021-09-01 NOTE — Progress Notes (Unsigned)
Cardiology Office Note    Date:  09/02/2021   ID:  Jon Bennett, DOB [birth year], MRN [id number]  PCP:  Neale Burly, MD  Cardiologist: Carlyle Dolly, MD    Chief Complaint  Patient presents with   Hospitalization Follow-up    History of Present Illness:    Jon Bennett is a 87 y.o. 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   He was admitted with cholangitis in 07/2021 and required cholecystectomy on 08/06/2021. Was discharged home but presented back on 08/09/2021 for recurrent abdominal pain and his bilirubin was initially elevated but improved without intervention. Cardiology was consulted as he was noted to have episodes of atrial fibrillation with RVR. Given hypotension, he was started on IV Amiodarone with plans to remain on PO Amiodarone 400mg  x7 days, followed by 200mg  BID for 2 weeks then 200mg  daily with plans for a short-course of 6 weeks. He was already on anticoagulation and Eliquis was continued. He did convert back to NSR prior to discharge.   In talking with the patient today, he reports being at rehab since hospital discharge. Still having some intermittent abdominal pain but significantly improved from his admission and he says that his appetite is starting to come back. He was overall asymptomatic with his arrhythmia during admission and denies any recent chest pain or palpitations. No recent orthopnea, PND or pitting edema. He had been on Eliquis prior to admission and was continued on this at that time.  He denies any melena, hematochezia or hematuria.   Past Medical History:  Diagnosis Date   Diabetes mellitus without complication ([place])    Patient is not on any medication for this   GERD (gastroesophageal reflux disease)    Hypertension    Pancreatitis, alcoholic, acute    Portal vein thrombosis 07/01/2021   Splenic vein  thrombosis    Stroke [facility])    mini strokes    Past Surgical History:  Procedure Laterality Date   APPENDECTOMY     CAROTID ANGIOGRAPHY     CHOLECYSTECTOMY N/A 08/06/2021   Procedure: LAPAROSCOPIC CHOLECYSTECTOMY;  Surgeon: Aviva Signs, MD;  Location: [facility] ORS;  Service: General;  Laterality: N/A;   ERCP N/A 07/03/2021   Procedure: ENDOSCOPIC RETROGRADE CHOLANGIOPANCREATOGRAPHY (ERCP);  Surgeon: Jackquline Denmark, MD;  Location: [facility] ENDOSCOPY;  Service: Gastroenterology;  Laterality: N/A;   esophageal tear repair     ESOPHAGOGASTRODUODENOSCOPY (EGD) WITH PROPOFOL N/A 06/25/2021   Procedure: ESOPHAGOGASTRODUODENOSCOPY (EGD) WITH PROPOFOL;  Surgeon: Rogene Houston, MD;  Location: [facility] ENDO SUITE;  Service: Endoscopy;  Laterality: N/A;   REMOVAL OF STONES  07/03/2021   Procedure: REMOVAL OF STONES;  Surgeon: Jackquline Denmark, MD;  Location: [place];  Service: Gastroenterology;;   [name]  07/03/2021   Procedure: [name];  Surgeon: Jackquline Denmark, MD;  Location: [facility] ENDOSCOPY;  Service: Gastroenterology;;   VASCULAR SURGERY      Current Medications: Outpatient Medications Prior to Visit  Medication Sig Dispense Refill   acamprosate (CAMPRAL) 333 MG tablet Take 666 mg by mouth 3 (three) times daily.     acetaminophen (TYLENOL) 500 MG tablet Take 500 mg by mouth daily as needed for mild pain or moderate pain.     bisacodyl (DULCOLAX) 10 MG suppository Place 10 mg rectally daily as needed for mild constipation. Only use if Milk of Magnesia doesn't help     diclofenac Sodium (VOLTAREN)  1 % GEL Apply 4 g topically in the morning and at bedtime. Apply to left knee     ELIQUIS 5 MG TABS tablet TAKE 1 TABLET BY MOUTH TWICE A DAY (Patient taking differently: Take 5 mg by mouth 2 (two) times daily.) 60 tablet 2   feeding supplement (ENSURE ENLIVE / ENSURE PLUS) LIQD Take 237 mLs by mouth 3 (three) times daily between meals. 123XX123 mL 12   folic acid (FOLVITE) 1 MG tablet Take 1 tablet (1 mg  total) by mouth daily.     Magnesium Hydroxide (MILK OF MAGNESIA PO) Take 30 mLs by mouth daily as needed (constipation). If no BM in 3 days     metoprolol tartrate (LOPRESSOR) 25 MG tablet Take 0.5 tablets (12.5 mg total) by mouth 2 (two) times daily. 60 tablet 1   mirtazapine (REMERON) 15 MG tablet Take 15 mg by mouth at bedtime.     Multiple Vitamin (MULTIVITAMIN WITH MINERALS) TABS tablet Take 1 tablet by mouth daily.     ondansetron (ZOFRAN) 4 MG tablet Take 4 mg by mouth every 8 (eight) hours as needed for nausea or vomiting.     ondansetron (ZOFRAN-ODT) 4 MG disintegrating tablet Take 1 tablet (4 mg total) by mouth every 8 (eight) hours as needed for nausea or vomiting. 20 tablet 0   pantoprazole (PROTONIX) 40 MG tablet Take 1 tablet (40 mg total) by mouth 2 (two) times daily. 30 tablet 0   tamsulosin (FLOMAX) 0.4 MG CAPS capsule Take 0.4 mg by mouth daily.     thiamine 100 MG tablet Take 1 tablet (100 mg total) by mouth daily. 30 tablet 0   amiodarone (PACERONE) 200 MG tablet Take 1 tablet (200 mg total) by mouth 2 (two) times daily. Take 2 tablets twice a day for one week, then 1 tablet twice a day for 2 weeks, then 1 tablet daily. 90 tablet 0   midodrine (PROAMATINE) 5 MG tablet Take 5 mg by mouth daily as needed (SBP </=100).     No facility-administered medications prior to visit.     Allergies:   Asa [aspirin]   Social History   Socioeconomic History   Marital status: Widowed    Spouse name: Not on file   Number of children: 4   Years of education: Not on file   Highest education level: Not on file  Occupational History   Occupation: retired  Tobacco Use   Smoking status: Never   Smokeless tobacco: Never  Vaping Use   Vaping Use: Never used  Substance and Sexual Activity   Alcohol use: Not Currently    Alcohol/week: 1.0 standard drink of alcohol    Types: 1 Shots of liquor per week   Drug use: Never   Sexual activity: Not Currently  Other Topics Concern   Not on  file  Social History Narrative   Not on file   Social Determinants of Health   Financial Resource Strain: Low Risk  (02/21/2020)   Overall Financial Resource Strain (CARDIA)    Difficulty of Paying Living Expenses: Not hard at all  Food Insecurity: No Food Insecurity (02/21/2020)   Hunger Vital Sign    Worried About Running Out of Food in the Last Year: Never true    [place] in the Last Year: Never true  Transportation Needs: No Transportation Needs (02/21/2020)   PRAPARE - Hydrologist (Medical): No    Lack of Transportation (Non-Medical): No  Physical Activity:  Insufficiently Active (02/21/2020)   Exercise Vital Sign    Days of Exercise per Week: 3 days    Minutes of Exercise per Session: 30 min  Stress: No Stress Concern Present (02/21/2020)   [employer] of Occupational Health - Occupational Stress Questionnaire    Feeling of Stress : Not at all  Social Connections: Moderately Integrated (02/21/2020)   Social Connection and Isolation Panel [NHANES]    Frequency of Communication with Friends and Family: More than three times a week    Frequency of Social Gatherings with Friends and Family: More than three times a week    Attends Religious Services: 1 to 4 times per year    Active Member of [employer] or Organizations: No    Attends Banker Meetings: 1 to 4 times per year    Marital Status: Widowed     Family History:  The patient's family history includes Cancer in his maternal grandfather; Healthy in his brother, brother, brother, brother, sister, sister, sister, sister, son, son, son, and son; Heart disease in his father; Hypertension in an other family member.   Review of Systems:    Please see the history of present illness.     All other systems reviewed and are otherwise negative except as noted above.   Physical Exam:    VS:  BP (!) 90/54   Pulse 60   Ht 5\' 9"  (1.753 m)   BMI 23.28 kg/m    General: Pleasant, thin  male appearing in no acute distress. Sitting in wheelchair.  Head: Normocephalic, atraumatic. Neck: No carotid bruits. JVD not elevated.  Lungs: Respirations regular and unlabored, without wheezes or rales.  Heart: Regular rate and rhythm. No S3 or S4.  No murmur, no rubs, or gallops appreciated. Abdomen: Appears non-distended. No obvious abdominal masses. Msk:  Strength and tone appear normal for age. No obvious joint deformities or effusions. Extremities: No clubbing or cyanosis. No pitting edema.  Distal pedal pulses are 2+ bilaterally. Neuro: Alert and oriented X 3. Moves all extremities spontaneously. No focal deficits noted. Psych:  Responds to questions appropriately with a normal affect. Skin: No rashes or lesions noted  Wt Readings from Last 3 Encounters:  08/11/21 157 lb 10.1 oz (71.5 kg)  08/06/21 141 lb 15.6 oz (64.4 kg)  08/05/21 142 lb (64.4 kg)     Studies/Labs Reviewed:   EKG:  EKG is not ordered today.   Recent Labs: 08/09/2021: TSH 0.970 08/11/2021: Magnesium 1.9 08/27/2021: ALT 14; BUN 14; Creatinine, Ser 1.08; Hemoglobin 10.8; Platelets 363; Potassium 4.4; Sodium 136   Lipid Panel No results found for: "CHOL", "TRIG", "HDL", "CHOLHDL", "VLDL", "LDLCALC", "LDLDIRECT"  Additional studies/ records that were reviewed today include:   Echo: 08/11/2021 IMPRESSIONS     1. Left ventricular ejection fraction, by estimation, is 60 to 65%. The  left ventricle has normal function. The left ventricle has no regional  wall motion abnormalities. There is mild left ventricular hypertrophy.  Left ventricular diastolic parameters  are indeterminate. Elevated left atrial pressure.   2. Right ventricular systolic function is normal. The right ventricular  size is normal.   3. The mitral valve is normal in structure. No evidence of mitral valve  regurgitation. No evidence of mitral stenosis.   4. The tricuspid valve is abnormal.   5. The aortic valve is tricuspid. Aortic  valve regurgitation is not  visualized. No aortic stenosis is present.   Assessment:    1. Paroxysmal atrial fibrillation with RVR (HCC)  2. Orthostatic hypotension   3. Portal vein thrombosis      Plan:   In order of problems listed above:  1. Paroxysmal Atrial Fibrillation - He developed postoperative atrial fibrillation during his recent admission and it was recommended he be on a short course of Amiodarone for 6 weeks. He is currently at rehab and will make sure they have reduced this to 200 mg daily. Would anticipate discontinuation on 09/23/2021. Remains on Lopressor 12.5mg  BID.  - No reports of active bleeding. He has been continued on Eliquis 5 mg twice daily for anticoagulation as he was on anticoagulation prior to admission as well.  2. Orthostatic Hypotension - His BP is soft at 90/54 during today's visit but he denies any associated dizziness or presyncope. Reports a history of hypotension in the past. He was previously on Midodrine 5 mg as needed but this is not currently on his medication list at rehab. Will add this to take as needed.  3. Prior Splenic and Portal Vein Thrombosis  - He was on anticoagulation prior to his recent admission and has been continued on Eliquis 5 mg twice daily for anticoagulation. No reports of active bleeding. His hemoglobin was stable at 10.8 when checked on 08/27/2021 with platelets at 363 K.     Medication Adjustments/Labs and Tests Ordered: Current medicines are reviewed at length with the patient today.  Concerns regarding medicines are outlined above.  Medication changes, Labs and Tests ordered today are listed in the Patient Instructions below. Patient Instructions  Medication Instructions:   Decrease Amiodarone to 200 mg Daily  Stop Amiodarone on September 23, 2021 Take Midodrine 5 mg as directed. For SBP <= 100  *If you need a refill on your cardiac medications before your next appointment, please call your pharmacy*   Lab  Work: NONE   If you have labs (blood work) drawn today and your tests are completely normal, you will receive your results only by: MyChart Message (if you have MyChart) OR A paper copy in the mail If you have any lab test that is abnormal or we need to change your treatment, we will call you to review the results.   Testing/Procedures: NONE    Follow-Up: At [facility], you and your health needs are our priority.  As part of our continuing mission to provide you with exceptional heart care, we have created designated Provider Care Teams.  These Care Teams include your primary Cardiologist (physician) and Advanced Practice Providers (APPs -  Physician Assistants and Nurse Practitioners) who all work together to provide you with the care you need, when you need it.  We recommend signing up for the patient portal called "MyChart".  Sign up information is provided on this After Visit Summary.  MyChart is used to connect with patients for Virtual Visits (Telemedicine).  Patients are able to view lab/test results, encounter notes, upcoming appointments, etc.  Non-urgent messages can be sent to your provider as well.   To learn more about what you can do with MyChart, go to [email or web address].    Your next appointment:   6 month(s)  The format for your next appointment:   In Person  Provider:   Dina Rich, MD    Other Instructions Thank you for choosing [facility]!    Important Information About Sugar         Signed, Ellsworth Lennox, PA-C  09/02/2021 5:31 PM    [facility] [address] [place], [place]  [place] Phone: [phone] Fax: [phone]

## 2021-09-01 NOTE — Progress Notes (Signed)
Assessment &  Plan   Patient is an 87 yo male with a past medical history significant for splenic vein thrombosis on Eliquis, DM, HTN, cholelithiasis s/p cholecystectomy, Etoh use disorder, large hiatal hernia, Afib ( recently diagnosed)   # 87 year old male with recent admission for recent cholelithiasis/choledocholithiasis /cholangitis.  He is post ERCP with sphincterotomy and stone removal followed by  cholecystectomy late May 2020.  Apparently still having intermittent intermittent nausea , vomiting and diarrhea. I etiology unclear.  Recent CT scan done in the ED 4 days ago without any acute findings.  Nothing really unexpected on his labs . I do not see that stool studies have been done though no bowel wall thickening on CT scan to suggest infectious colitis and he reports only 2 loose stools a day.He does have a large hiatal hernia on imaging so wonder if that could be contributing to the nausea / vomiting. Will ask nursing facility to collect stool for C. difficile and send results to our office.  Further recommendations pending results Continue twice daily PPI Continue Zofran, he says that it does help Follow-up with Dr. Lyndel Safe  # Large hiatal hernia, small Mallory Weiss tear, esophagitis  and duodenitis on EGD in April 2023.   Continue pantoprazole twice daily  # History of splenic vein thrombosis, on Eliquis  HPI   Chief Complaint : intermittent nausea, vomiting and diarrhea  Patient was hospitalized at [facility] mid April with N/V, coffee-ground emesis, elevated liver chemistries.   Imaging showed cholelithiasis, dilated CBD.  He underwent an EGD by Dr. 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   His condition deteriorated with developing sepsis / bacteremia and concern for cholangitis and he  was transferred to [facility] for possible ERCP.    April 2023 Admission for cholangitis.  We saw him for hospital consultation on 07/01/21. MRCP showed choledocholithiasis.  He underwent an ERCP by Dr. Lyndel Safe on 07/03/2020.  A biliary sphincterotomy with stone extraction was performed.   He was evaluated by general surgery.  Family and surgery agreed to postpone cholecystectomy until patient was physically stronger.  During that admission we noted that patient was having diarrhea.  We mentioned checking stool studies but I do not see the results of any in epic .  He was discharged on 07/08/2021.  From a GI standpoint he was to follow-up with Dr. Laural Golden / Dr. Abbey Chatters in [place] was recommended.   07/23/21 patient's son Dr. Paulino Door contacted Dr. Lyndel Safe through [place].  He was concerned because his dad was having bouts of loose stool and intermittent severe abdominal pain.  It appears Dr. Lyndel Safe ordered labs and was considering a CT scan but a few days later he saw general surgeon Dr. Arnoldo Morale in [place] and was scheduled for laparoscopic cholecystectomy on 08/06/21.  According to the discharge summary 08/07/2021 he tolerated the procedure well and was tolerating a regular diet the next morning.   08/09/21 went to [facility] ED with chills, fever, nausea,  RLQ pain.  He was tachycardic . CT scan showed expected postoperative findings.  Dr. Arnoldo Morale evaluated the patient in the ED. He recommended observational stay, broad-spectrum antibiotics, possible HIDA.  That admission he developed new atrial fibrillation with RVR.  Treated with amiodarone, converted back to normal sinus rhythm.  Cardiology  was following.  His abdominal pain resolved.  He was discharged back to the nursing home on 08/12/2021.   08/27/21 Back to ED He was having dizziness, diarrhea, vomiting.  His labs were unchanged and stable and white count was normal.  CTAP w/ contrast without acute intra-abdominal pathology identified. No definite  radiographic explanation for the patient's reported symptoms. Interval decrease in size of postoperative gas and fluid collection within the gallbladder fossa, large hiatal hernia  Interval History:  Patient is here alone from a rehab facility, presumably for ED follow up on nausea, vomiting and diarrhea. He explains that his symptoms are intermittent. Anti-emetics help. He has up to 2 loose BMs a day. He isn't having any abdominal pain.     Previous GI Evaluation   EGD 06/25/21 Dr. Laural Golden - Normal hypopharynx. - Normal proximal esophagus and mid esophagus. - LA Grade B reflux esophagitis with no bleeding. - Z-line regular, 30 cm from the incisors. - 10 cm hiatal hernia. - Mallory-Weiss tear. Mallory-Weiss tear located just below the GE junction along the lesser curvature. - Duodenitis. - Non-bleeding duodenal diverticulum. - No specimens collected.  07/03/21 ERCP -The major papilla was located entirely within a diverticulum. - Choledocholithiasis s/p biliary sphincterotomy with stone extraction. - Cholelithiasis with patent cystic duct - Moderate size hiatal hernia.   Imaging   08/27/21 CTAP with contrast ( ED visit) IMPRESSION: 1. No acute intra-abdominal pathology identified. No definite radiographic explanation for the patient's reported symptoms. 2. Status post cholecystectomy. Interval decrease in size of postoperative gas and fluid collection within the gallbladder fossa. 3. Mild hepatic steatosis. 4. Large hiatal hernia.   Labs:     Latest Ref Rng & Units 08/27/2021    6:58 PM 08/12/2021    3:50 AM 08/11/2021    3:50 AM  CBC  WBC 4.0 - 10.5 K/uL 7.4  4.6  4.5   Hemoglobin 13.0 - 17.0 g/dL 10.8  8.8  8.9   Hematocrit 39.0 - 52.0 % 33.1  26.1  26.5   Platelets 150 - 400 K/uL 363  51  50        Latest Ref Rng & Units 08/27/2021    6:58 PM 08/11/2021    3:50 AM 08/10/2021    6:10 AM  Hepatic Function  Total Protein 6.5 - 8.1 g/dL 6.8  4.3  5.2   Albumin 3.5 - 5.0  g/dL 2.4  1.9  2.4   AST 15 - 41 U/L $Remo'23  18  17   'JrXpU$ ALT 0 - 44 U/L $Remo'14  7  10   'ZgfmF$ Alk Phosphatase 38 - 126 U/L 76  57  69   Total Bilirubin 0.3 - 1.2 mg/dL 0.8  0.8  0.7      Past Medical History:  Diagnosis Date   Diabetes mellitus without complication ([place])    Patient is not on any medication for this   GERD (gastroesophageal reflux disease)    Hypertension    Pancreatitis, alcoholic, acute    Portal vein thrombosis 07/01/2021   Splenic vein thrombosis    Stroke ([place])    mini strokes    Past Surgical History:  Procedure Laterality Date   APPENDECTOMY     CAROTID ANGIOGRAPHY     CHOLECYSTECTOMY N/A 08/06/2021   Procedure: LAPAROSCOPIC CHOLECYSTECTOMY;  Surgeon: Aviva Signs, MD;  Location: [facility] ORS;  Service: General;  Laterality: N/A;   ERCP N/A 07/03/2021   Procedure: ENDOSCOPIC RETROGRADE CHOLANGIOPANCREATOGRAPHY (ERCP);  Surgeon: Jackquline Denmark, MD;  Location: [facility] ENDOSCOPY;  Service: Gastroenterology;  Laterality: N/A;   esophageal tear repair     ESOPHAGOGASTRODUODENOSCOPY (EGD) WITH PROPOFOL N/A 06/25/2021   Procedure: ESOPHAGOGASTRODUODENOSCOPY (EGD) WITH PROPOFOL;  Surgeon: Rogene Houston, MD;  Location: [facility] ENDO SUITE;  Service: Endoscopy;  Laterality: N/A;   REMOVAL OF STONES  07/03/2021   Procedure: REMOVAL OF STONES;  Surgeon: Jackquline Denmark, MD;  Location: [facility] ENDOSCOPY;  Service: Gastroenterology;;   [name]  07/03/2021   Procedure: [name];  Surgeon: Jackquline Denmark, MD;  Location: [facility] ENDOSCOPY;  Service: Gastroenterology;;   VASCULAR SURGERY      Current Medications, Allergies, Family History and Social History were reviewed in [place] record.     Current Outpatient Medications  Medication Sig Dispense Refill   acamprosate (CAMPRAL) 333 MG tablet Take 666 mg by mouth 3 (three) times daily.     acetaminophen (TYLENOL) 500 MG tablet Take 500 mg by mouth daily as needed for mild pain or moderate pain.     amiodarone (PACERONE)  200 MG tablet Take 1 tablet (200 mg total) by mouth 2 (two) times daily. Take 2 tablets twice a day for one week, then 1 tablet twice a day for 2 weeks, then 1 tablet daily. 90 tablet 0   bisacodyl (DULCOLAX) 10 MG suppository Place 10 mg rectally daily as needed for mild constipation. Only use if Milk of Magnesia doesn't help     diclofenac Sodium (VOLTAREN) 1 % GEL Apply 4 g topically in the morning and at bedtime. Apply to left knee     ELIQUIS 5 MG TABS tablet TAKE 1 TABLET BY MOUTH TWICE A DAY (Patient taking differently: Take 5 mg by mouth 2 (two) times daily.) 60 tablet 2   feeding supplement (ENSURE ENLIVE / ENSURE PLUS) LIQD Take 237 mLs by mouth 3 (three) times daily between meals. 892 mL 12   folic acid (FOLVITE) 1 MG tablet Take 1 tablet (1 mg total) by mouth daily.     Magnesium Hydroxide (MILK OF MAGNESIA PO) Take 30 mLs by mouth daily as needed (constipation). If no BM in 3 days     metoprolol tartrate (LOPRESSOR) 25 MG tablet Take 0.5 tablets (12.5 mg total) by mouth 2 (two) times daily. 60 tablet 1   midodrine (PROAMATINE) 5 MG tablet Take 5 mg by mouth daily as needed (SBP </=100).     mirtazapine (REMERON) 15 MG tablet Take 15 mg by mouth at bedtime.     Multiple Vitamin (MULTIVITAMIN WITH MINERALS) TABS tablet Take 1 tablet by mouth daily.     ondansetron (ZOFRAN) 4 MG tablet Take 4 mg by mouth every 8 (eight) hours as needed for nausea or vomiting.     ondansetron (ZOFRAN-ODT) 4 MG disintegrating tablet Take 1 tablet (4 mg total) by mouth every 8 (eight) hours as needed for nausea or vomiting. 20 tablet 0   pantoprazole (PROTONIX) 40 MG tablet Take 1 tablet (40 mg total) by mouth 2 (two) times daily. 30 tablet 0   tamsulosin (FLOMAX) 0.4 MG CAPS capsule Take 0.4 mg by mouth daily.     thiamine 100 MG tablet Take 1 tablet (100 mg total) by mouth daily. 30 tablet 0   No current facility-administered medications for this visit.    Review of Systems: No chest pain. No shortness  of breath. No urinary complaints.    Physical Exam  Wt Readings from Last 3 Encounters:  08/11/21 157 lb 10.1 oz (71.5 kg)  08/06/21 141 lb 15.6 oz (64.4 kg)  08/05/21 142 lb (64.4 kg)    BP 90/60   Pulse 68  Constitutional:  Well developed male in wheelchair in no acute distress. Psychiatric: Pleasant. Normal mood and affect. Behavior is normal. EENT: Pupils normal.  Conjunctivae are normal. No scleral icterus. Neck supple.  Cardiovascular: Normal rate, regular rhythm. No edema Pulmonary/chest: Effort normal and breath sounds normal. No wheezing, rales or rhonchi. Abdominal: Limited exam in wheelchair .  Abdomen is soft, nondistended, nontender. Bowel sounds active throughout.  Mild bruising in the RUQ  Neurological: Alert and oriented to person place and time. Skin: Skin is warm and dry.  I spent 30 minutes total reviewing records, obtaining history, performing exam, counseling patient and documenting visit / findings.   Tye Savoy, NP  09/01/2021, 8:23 AM  Cc:  Neale Burly, MD

## 2021-09-02 ENCOUNTER — Encounter: Payer: Self-pay | Admitting: Student

## 2021-09-02 ENCOUNTER — Ambulatory Visit (INDEPENDENT_AMBULATORY_CARE_PROVIDER_SITE_OTHER): Payer: Medicare Other | Admitting: Student

## 2021-09-02 VITALS — BP 90/54 | HR 60 | Ht 69.0 in

## 2021-09-02 DIAGNOSIS — I81 Portal vein thrombosis: Secondary | ICD-10-CM

## 2021-09-02 DIAGNOSIS — I480 Paroxysmal atrial fibrillation: Secondary | ICD-10-CM

## 2021-09-02 DIAGNOSIS — R112 Nausea with vomiting, unspecified: Secondary | ICD-10-CM | POA: Diagnosis not present

## 2021-09-02 DIAGNOSIS — I951 Orthostatic hypotension: Secondary | ICD-10-CM | POA: Diagnosis not present

## 2021-09-02 DIAGNOSIS — R197 Diarrhea, unspecified: Secondary | ICD-10-CM | POA: Diagnosis not present

## 2021-09-02 DIAGNOSIS — R5381 Other malaise: Secondary | ICD-10-CM | POA: Diagnosis not present

## 2021-09-02 MED ORDER — MIDODRINE HCL 5 MG PO TABS: 5.0000 mg | ORAL_TABLET | Freq: Every day | ORAL | 3 refills | Status: DC | PRN

## 2021-09-02 MED ORDER — AMIODARONE HCL 200 MG PO TABS: 200.0000 mg | ORAL_TABLET | Freq: Every day | ORAL | 3 refills | Status: DC

## 2021-09-02 NOTE — Patient Instructions (Signed)
Medication Instructions:   Decrease Amiodarone to 200 mg Daily  Stop Amiodarone on September 23, 2021 Take Midodrine 5 mg as directed. For SBP <= 100  *If you need a refill on your cardiac medications before your next appointment, please call your pharmacy*   Lab Work: NONE   If you have labs (blood work) drawn today and your tests are completely normal, you will receive your results only by: MyChart Message (if you have MyChart) OR A paper copy in the mail If you have any lab test that is abnormal or we need to change your treatment, we will call you to review the results.   Testing/Procedures: NONE    Follow-Up: At [facility], you and your health needs are our priority.  As part of our continuing mission to provide you with exceptional heart care, we have created designated Provider Care Teams.  These Care Teams include your primary Cardiologist (physician) and Advanced Practice Providers (APPs -  Physician Assistants and Nurse Practitioners) who all work together to provide you with the care you need, when you need it.  We recommend signing up for the patient portal called "MyChart".  Sign up information is provided on this After Visit Summary.  MyChart is used to connect with patients for Virtual Visits (Telemedicine).  Patients are able to view lab/test results, encounter notes, upcoming appointments, etc.  Non-urgent messages can be sent to your provider as well.   To learn more about what you can do with MyChart, go to [email or web address].    Your next appointment:   6 month(s)  The format for your next appointment:   In Person  Provider:   Dina Rich, MD    Other Instructions Thank you for choosing [facility]!    Important Information About Sugar

## 2021-09-03 DIAGNOSIS — E43 Unspecified severe protein-calorie malnutrition: Secondary | ICD-10-CM | POA: Diagnosis not present

## 2021-09-03 DIAGNOSIS — R627 Adult failure to thrive: Secondary | ICD-10-CM | POA: Diagnosis not present

## 2021-09-03 DIAGNOSIS — M6281 Muscle weakness (generalized): Secondary | ICD-10-CM | POA: Diagnosis not present

## 2021-09-03 DIAGNOSIS — R5381 Other malaise: Secondary | ICD-10-CM | POA: Diagnosis not present

## 2021-09-07 DIAGNOSIS — R634 Abnormal weight loss: Secondary | ICD-10-CM | POA: Diagnosis not present

## 2021-09-07 DIAGNOSIS — R112 Nausea with vomiting, unspecified: Secondary | ICD-10-CM | POA: Diagnosis not present

## 2021-09-07 DIAGNOSIS — R2689 Other abnormalities of gait and mobility: Secondary | ICD-10-CM | POA: Diagnosis not present

## 2021-09-07 DIAGNOSIS — E43 Unspecified severe protein-calorie malnutrition: Secondary | ICD-10-CM | POA: Diagnosis not present

## 2021-09-07 DIAGNOSIS — Z1159 Encounter for screening for other viral diseases: Secondary | ICD-10-CM | POA: Diagnosis not present

## 2021-09-07 DIAGNOSIS — M6281 Muscle weakness (generalized): Secondary | ICD-10-CM | POA: Diagnosis not present

## 2021-09-07 DIAGNOSIS — R197 Diarrhea, unspecified: Secondary | ICD-10-CM | POA: Diagnosis not present

## 2021-09-07 DIAGNOSIS — I81 Portal vein thrombosis: Secondary | ICD-10-CM | POA: Diagnosis not present

## 2021-09-08 DIAGNOSIS — Z1159 Encounter for screening for other viral diseases: Secondary | ICD-10-CM | POA: Diagnosis not present

## 2021-09-08 DIAGNOSIS — R630 Anorexia: Secondary | ICD-10-CM | POA: Diagnosis not present

## 2021-09-08 DIAGNOSIS — A498 Other bacterial infections of unspecified site: Secondary | ICD-10-CM | POA: Diagnosis not present

## 2021-09-08 DIAGNOSIS — R2689 Other abnormalities of gait and mobility: Secondary | ICD-10-CM | POA: Diagnosis not present

## 2021-09-08 DIAGNOSIS — I81 Portal vein thrombosis: Secondary | ICD-10-CM | POA: Diagnosis not present

## 2021-09-08 DIAGNOSIS — R634 Abnormal weight loss: Secondary | ICD-10-CM | POA: Diagnosis not present

## 2021-09-08 DIAGNOSIS — R627 Adult failure to thrive: Secondary | ICD-10-CM | POA: Diagnosis not present

## 2021-09-08 DIAGNOSIS — M6281 Muscle weakness (generalized): Secondary | ICD-10-CM | POA: Diagnosis not present

## 2021-09-09 DIAGNOSIS — Z1159 Encounter for screening for other viral diseases: Secondary | ICD-10-CM | POA: Diagnosis not present

## 2021-09-09 DIAGNOSIS — M6281 Muscle weakness (generalized): Secondary | ICD-10-CM | POA: Diagnosis not present

## 2021-09-09 DIAGNOSIS — R2689 Other abnormalities of gait and mobility: Secondary | ICD-10-CM | POA: Diagnosis not present

## 2021-09-09 DIAGNOSIS — I81 Portal vein thrombosis: Secondary | ICD-10-CM | POA: Diagnosis not present

## 2021-09-10 DIAGNOSIS — I81 Portal vein thrombosis: Secondary | ICD-10-CM | POA: Diagnosis not present

## 2021-09-10 DIAGNOSIS — Z1159 Encounter for screening for other viral diseases: Secondary | ICD-10-CM | POA: Diagnosis not present

## 2021-09-10 DIAGNOSIS — M6281 Muscle weakness (generalized): Secondary | ICD-10-CM | POA: Diagnosis not present

## 2021-09-10 DIAGNOSIS — R2689 Other abnormalities of gait and mobility: Secondary | ICD-10-CM | POA: Diagnosis not present

## 2021-09-11 DIAGNOSIS — Z1159 Encounter for screening for other viral diseases: Secondary | ICD-10-CM | POA: Diagnosis not present

## 2021-09-11 DIAGNOSIS — I81 Portal vein thrombosis: Secondary | ICD-10-CM | POA: Diagnosis not present

## 2021-09-11 DIAGNOSIS — M6281 Muscle weakness (generalized): Secondary | ICD-10-CM | POA: Diagnosis not present

## 2021-09-11 DIAGNOSIS — R2689 Other abnormalities of gait and mobility: Secondary | ICD-10-CM | POA: Diagnosis not present

## 2021-09-11 NOTE — Progress Notes (Signed)
Agree with assessment/plan RG 

## 2021-09-13 DIAGNOSIS — I81 Portal vein thrombosis: Secondary | ICD-10-CM | POA: Diagnosis not present

## 2021-09-13 DIAGNOSIS — M6281 Muscle weakness (generalized): Secondary | ICD-10-CM | POA: Diagnosis not present

## 2021-09-14 DIAGNOSIS — R5381 Other malaise: Secondary | ICD-10-CM | POA: Diagnosis not present

## 2021-09-14 DIAGNOSIS — R634 Abnormal weight loss: Secondary | ICD-10-CM | POA: Diagnosis not present

## 2021-09-14 DIAGNOSIS — I81 Portal vein thrombosis: Secondary | ICD-10-CM | POA: Diagnosis not present

## 2021-09-14 DIAGNOSIS — M6281 Muscle weakness (generalized): Secondary | ICD-10-CM | POA: Diagnosis not present

## 2021-09-14 DIAGNOSIS — A498 Other bacterial infections of unspecified site: Secondary | ICD-10-CM | POA: Diagnosis not present

## 2021-09-14 DIAGNOSIS — R627 Adult failure to thrive: Secondary | ICD-10-CM | POA: Diagnosis not present

## 2021-09-15 DIAGNOSIS — I81 Portal vein thrombosis: Secondary | ICD-10-CM | POA: Diagnosis not present

## 2021-09-15 DIAGNOSIS — M6281 Muscle weakness (generalized): Secondary | ICD-10-CM | POA: Diagnosis not present

## 2021-09-16 DIAGNOSIS — E46 Unspecified protein-calorie malnutrition: Secondary | ICD-10-CM | POA: Diagnosis not present

## 2021-09-16 DIAGNOSIS — R627 Adult failure to thrive: Secondary | ICD-10-CM | POA: Diagnosis not present

## 2021-09-16 DIAGNOSIS — I81 Portal vein thrombosis: Secondary | ICD-10-CM | POA: Diagnosis not present

## 2021-09-16 DIAGNOSIS — A498 Other bacterial infections of unspecified site: Secondary | ICD-10-CM | POA: Diagnosis not present

## 2021-09-16 DIAGNOSIS — R634 Abnormal weight loss: Secondary | ICD-10-CM | POA: Diagnosis not present

## 2021-09-16 DIAGNOSIS — M6281 Muscle weakness (generalized): Secondary | ICD-10-CM | POA: Diagnosis not present

## 2021-09-17 DIAGNOSIS — R634 Abnormal weight loss: Secondary | ICD-10-CM | POA: Diagnosis not present

## 2021-09-17 DIAGNOSIS — R627 Adult failure to thrive: Secondary | ICD-10-CM | POA: Diagnosis not present

## 2021-09-17 DIAGNOSIS — M6281 Muscle weakness (generalized): Secondary | ICD-10-CM | POA: Diagnosis not present

## 2021-09-17 DIAGNOSIS — R1110 Vomiting, unspecified: Secondary | ICD-10-CM | POA: Diagnosis not present

## 2021-09-17 DIAGNOSIS — I81 Portal vein thrombosis: Secondary | ICD-10-CM | POA: Diagnosis not present

## 2021-09-18 DIAGNOSIS — I4891 Unspecified atrial fibrillation: Secondary | ICD-10-CM | POA: Diagnosis not present

## 2021-09-18 DIAGNOSIS — N401 Enlarged prostate with lower urinary tract symptoms: Secondary | ICD-10-CM | POA: Diagnosis not present

## 2021-09-18 DIAGNOSIS — I10 Essential (primary) hypertension: Secondary | ICD-10-CM | POA: Diagnosis not present

## 2021-09-18 DIAGNOSIS — E1151 Type 2 diabetes mellitus with diabetic peripheral angiopathy without gangrene: Secondary | ICD-10-CM | POA: Diagnosis not present

## 2021-09-30 ENCOUNTER — Ambulatory Visit: Payer: Medicare Other | Admitting: Gastroenterology

## 2021-10-19 ENCOUNTER — Other Ambulatory Visit: Payer: Self-pay

## 2021-10-19 ENCOUNTER — Emergency Department (HOSPITAL_COMMUNITY)
Admission: EM | Admit: 2021-10-19 | Discharge: 2021-10-19 | Disposition: A | Discharge status: Home / Self Care | Payer: Medicare Other | Attending: Student | Admitting: Student

## 2021-10-19 ENCOUNTER — Encounter (HOSPITAL_COMMUNITY): Payer: Self-pay

## 2021-10-19 DIAGNOSIS — M7989 Other specified soft tissue disorders: Secondary | ICD-10-CM | POA: Diagnosis present

## 2021-10-19 DIAGNOSIS — Z79899 Other long term (current) drug therapy: Secondary | ICD-10-CM | POA: Insufficient documentation

## 2021-10-19 DIAGNOSIS — Z7901 Long term (current) use of anticoagulants: Secondary | ICD-10-CM | POA: Insufficient documentation

## 2021-10-19 DIAGNOSIS — I10 Essential (primary) hypertension: Secondary | ICD-10-CM | POA: Diagnosis not present

## 2021-10-19 DIAGNOSIS — R600 Localized edema: Secondary | ICD-10-CM | POA: Insufficient documentation

## 2021-10-19 DIAGNOSIS — E119 Type 2 diabetes mellitus without complications: Secondary | ICD-10-CM | POA: Diagnosis not present

## 2021-10-19 LAB — CBC WITH DIFFERENTIAL/PLATELET
Abs Immature Granulocytes: 0.01 10*3/uL (ref 0.00–0.07)
Basophils Absolute: 0 10*3/uL (ref 0.0–0.1)
Basophils Relative: 1 %
Eosinophils Absolute: 0.2 10*3/uL (ref 0.0–0.5)
Eosinophils Relative: 4 %
HCT: 31.2 % — ABNORMAL LOW (ref 39.0–52.0)
Hemoglobin: 10.2 g/dL — ABNORMAL LOW (ref 13.0–17.0)
Immature Granulocytes: 0 %
Lymphocytes Relative: 19 %
Lymphs Abs: 1.2 10*3/uL (ref 0.7–4.0)
MCH: 30.6 pg (ref 26.0–34.0)
MCHC: 32.7 g/dL (ref 30.0–36.0)
MCV: 93.7 fL (ref 80.0–100.0)
Monocytes Absolute: 0.6 10*3/uL (ref 0.1–1.0)
Monocytes Relative: 10 %
Neutro Abs: 4.2 10*3/uL (ref 1.7–7.7)
Neutrophils Relative %: 66 %
Platelets: 121 10*3/uL — ABNORMAL LOW (ref 150–400)
RBC: 3.33 MIL/uL — ABNORMAL LOW (ref 4.22–5.81)
RDW: 14.6 % (ref 11.5–15.5)
WBC: 6.2 10*3/uL (ref 4.0–10.5)
nRBC: 0 % (ref 0.0–0.2)

## 2021-10-19 LAB — COMPREHENSIVE METABOLIC PANEL
ALT: 13 U/L (ref 0–44)
AST: 19 U/L (ref 15–41)
Albumin: 3.3 g/dL — ABNORMAL LOW (ref 3.5–5.0)
Alkaline Phosphatase: 76 U/L (ref 38–126)
Anion gap: 3 — ABNORMAL LOW (ref 5–15)
BUN: 18 mg/dL (ref 8–23)
CO2: 25 mmol/L (ref 22–32)
Calcium: 8.7 mg/dL — ABNORMAL LOW (ref 8.9–10.3)
Chloride: 106 mmol/L (ref 98–111)
Creatinine, Ser: 0.92 mg/dL (ref 0.61–1.24)
GFR, Estimated: >60 mL/min (ref >60)
Glucose, Bld: 107 mg/dL — ABNORMAL HIGH (ref 70–99)
Potassium: 4.1 mmol/L (ref 3.5–5.1)
Sodium: 134 mmol/L — ABNORMAL LOW (ref 135–145)
Total Bilirubin: 0.4 mg/dL (ref 0.3–1.2)
Total Protein: 6.6 g/dL (ref 6.5–8.1)

## 2021-10-19 LAB — TROPONIN I (HIGH SENSITIVITY): Troponin I (High Sensitivity): 6 ng/L (ref <18)

## 2021-10-19 LAB — BRAIN NATRIURETIC PEPTIDE: B Natriuretic Peptide: 711 pg/mL — ABNORMAL HIGH (ref 0.0–100.0)

## 2021-10-19 MED ORDER — FUROSEMIDE 10 MG/ML IJ SOLN
20.0000 mg | Freq: Once | INTRAMUSCULAR | Status: AC
  Administered 2021-10-19: 20 mg via INTRAVENOUS
  Filled 2021-10-19: qty 2

## 2021-10-19 MED ORDER — FUROSEMIDE 20 MG PO TABS: 20.0000 mg | ORAL_TABLET | Freq: Two times a day (BID) | ORAL | 0 refills | Status: DC

## 2021-10-19 NOTE — ED Notes (Signed)
ED Provider at bedside. 

## 2021-10-19 NOTE — ED Triage Notes (Signed)
Pt presents to ED with c/o bilateral leg swelling that started 2 days, pt here with son, whom he lives with, pt says he has been eating a lot of soup the past few days. Pt denies sob, currently on Eliquis for A-fib, but currently not on Lasix

## 2021-10-19 NOTE — ED Provider Notes (Signed)
[facility] EMERGENCY DEPARTMENT Provider Note  CSN: [id number] Arrival date & time: 10/19/21 0247  Chief Complaint(s) Leg Swelling  HPI Jon Bennett is a 87 y.o. male with PMH T2DM HTN, alcohol use disorder, splenic vein thrombosis who presents emergency department for evaluation of lower extremity swelling.  Patient states that over the last 3 days he has been eating copious amounts of broccoli cheddar soup that is high in salt content.  He states that his legs began to swell up and he was worried that something was wrong and comes to the emergency department for further evaluation.  Here in the emergency department, he denies any associated chest pain, shortness of breath, orthopnea, nausea, vomiting or other systemic symptoms.   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   Splenic vein thrombosis    Stroke [facility])    mini strokes   Patient Active Problem List   Diagnosis Date Noted   Atrial fibrillation (HCC) 08/10/2021   Postoperative abdominal pain 08/09/2021   History of alcohol abuse 08/09/2021   DNR (do not resuscitate) 08/09/2021   Paroxysmal atrial fibrillation with RVR (HCC) 08/09/2021   Hypotension 08/09/2021   Cholelithiasis 08/06/2021   Normocytic anemia 07/24/2021   Protein calorie malnutrition (HCC) 07/23/2021   Left knee pain 07/21/2021   Pressure injury of skin 07/04/2021   Hypokalemia 06/29/2021   Hyperbilirubinemia 06/27/2021   Calculus of gallbladder with acute cholecystitis and obstruction    Intractable nausea and vomiting 06/24/2021   Hypomagnesemia 06/24/2021   Lactic acidosis 06/24/2021   Hypercalcemia 06/24/2021   Transaminitis 06/24/2021   GI bleed 06/24/2021   AKI (acute kidney injury) (HCC) 06/24/2021    Hypophosphatemia 06/24/2021   Ascending cholangitis with severe sepsis 06/24/2021   Chronic anticoagulation 06/24/2021   Hyponatremia 08/24/2020   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 Authorizing Provider  furosemide (LASIX) 20 MG tablet Take 1 tablet (20 mg total) by mouth 2 (two) times daily. 10/19/21  Yes Jamaia Brum, MD  acamprosate (CAMPRAL) 333 MG tablet Take 666 mg by mouth 3 (three) times daily. 07/12/21   [provider]  acetaminophen (TYLENOL) 500 MG tablet Take 500 mg by mouth daily as needed for mild pain or moderate pain.    [provider]  amiodarone (PACERONE) 200 MG tablet Take 1 tablet (200 mg total) by mouth daily. 09/02/21   Strader, Lennart Pall, PA-C  bisacodyl (DULCOLAX) 10 MG suppository Place 10 mg rectally daily as needed for mild constipation. Only use if Milk of Magnesia doesn't help    [provider]  diclofenac Sodium (VOLTAREN) 1 % GEL Apply 4 g topically in the morning and at bedtime. Apply to left knee    [provider]  ELIQUIS 5 MG TABS tablet TAKE 1 TABLET BY MOUTH TWICE A DAY  Patient taking differently: Take 5 mg by mouth 2 (two) times daily. 05/20/21   Doreatha MassedKatragadda, Sreedhar, MD  feeding supplement (ENSURE ENLIVE / ENSURE PLUS) LIQD Take 237 mLs by mouth 3 (three) times daily between meals. 07/08/21   Ghimire, Werner LeanShanker M, MD  folic acid (FOLVITE) 1 MG tablet Take 1 tablet (1 mg total) by mouth daily. 07/09/21   Ghimire, Werner LeanShanker M, MD  Magnesium Hydroxide (MILK OF MAGNESIA PO) Take 30 mLs by  mouth daily as needed (constipation). If no BM in 3 days    [provider]  metoprolol tartrate (LOPRESSOR) 25 MG tablet Take 0.5 tablets (12.5 mg total) by mouth 2 (two) times daily. 08/12/21   Franky MachoJenkins, Mark, MD  midodrine (PROAMATINE) 5 MG tablet Take 1 tablet (5 mg total) by mouth daily as needed (SBP </=100). 09/02/21   Iran OuchStrader, GrenadaBrittany M, PA-C  mirtazapine (REMERON) 15 MG tablet Take 15 mg by mouth at bedtime.    [provider]  Multiple Vitamin (MULTIVITAMIN WITH MINERALS) TABS tablet Take 1 tablet by mouth daily. 07/09/21   Ghimire, Werner LeanShanker M, MD  ondansetron (ZOFRAN) 4 MG tablet Take 4 mg by mouth every 8 (eight) hours as needed for nausea or vomiting.    [provider]  ondansetron (ZOFRAN-ODT) 4 MG disintegrating tablet Take 1 tablet (4 mg total) by mouth every 8 (eight) hours as needed for nausea or vomiting. 08/27/21   Alvira MondaySchlossman, Erin, MD  pantoprazole (PROTONIX) 40 MG tablet Take 1 tablet (40 mg total) by mouth 2 (two) times daily. 07/08/21   Ghimire, Werner LeanShanker M, MD  tamsulosin (FLOMAX) 0.4 MG CAPS capsule Take 0.4 mg by mouth daily. 06/09/21   [provider]  thiamine 100 MG tablet Take 1 tablet (100 mg total) by mouth daily. 08/28/20   Maurilio LovelyShah, Pratik D, DO                                                                                                                                    Past Surgical History Past Surgical History:  Procedure Laterality Date   APPENDECTOMY     CAROTID ANGIOGRAPHY     CHOLECYSTECTOMY N/A 08/06/2021   Procedure: LAPAROSCOPIC CHOLECYSTECTOMY;  Surgeon: Franky MachoJenkins, Mark, MD;  Location: [facility] ORS;  Service: General;  Laterality: N/A;   ERCP N/A 07/03/2021   Procedure: ENDOSCOPIC RETROGRADE CHOLANGIOPANCREATOGRAPHY (ERCP);  Surgeon: Lynann BolognaGupta, Rajesh, MD;  [facility];  Service: Gastroenterology;  Laterality: N/A;   esophageal tear repair     ESOPHAGOGASTRODUODENOSCOPY (EGD) WITH PROPOFOL N/A 06/25/2021   Procedure:  ESOPHAGOGASTRODUODENOSCOPY (EGD) WITH PROPOFOL;  Surgeon: Malissa Hippoehman, Najeeb U, MD;  Location: [facility] ENDO SUITE;  Service: Endoscopy;  Laterality: N/A;   REMOVAL OF STONES  07/03/2021   Procedure: REMOVAL OF STONES;  Surgeon: Lynann BolognaGupta, Rajesh, MD;  [facility];  Service: Gastroenterology;;   Dennison MascotSPHINCTEROTOMY  07/03/2021   Procedure: Dennison MascotSPHINCTEROTOMY;  Surgeon: Lynann BolognaGupta, Rajesh, MD;  [facility];  Service:  Gastroenterology;;   VASCULAR SURGERY     Family History Family History  Problem Relation Age of Onset   Hypertension Other    Heart disease Father    Healthy Sister    Healthy Brother    Cancer Maternal Grandfather    Healthy Brother    Healthy Brother    Healthy Sister    Healthy Sister    Healthy Sister    Healthy Brother    Healthy Son    Healthy Son    Healthy Son    Healthy Son     Social History Social History   Tobacco Use   Smoking status: Never   Smokeless tobacco: Never  Vaping Use   Vaping Use: Never used  Substance Use Topics   Alcohol use: Not Currently    Alcohol/week: 1.0 standard drink of alcohol    Types: 1 Shots of liquor per week   Drug use: Never   Allergies Asa [aspirin]  Review of Systems Review of Systems  Cardiovascular:  Positive for leg swelling.    Physical Exam Vital Signs  I have reviewed the triage vital signs BP (!) 166/71   Pulse 60   Temp 98.1 F (36.7 C) (Oral)   Resp (!) 21   Ht 5\' 9"  (1.753 m)   Wt 71.2 kg   SpO2 95%   BMI 23.17 kg/m   Physical Exam Vitals and nursing note reviewed.  Constitutional:      General: He is not in acute distress.    Appearance: He is well-developed.  HENT:     Head: Normocephalic and atraumatic.  Eyes:     Conjunctiva/sclera: Conjunctivae normal.  Cardiovascular:     Rate and Rhythm: Normal rate and regular rhythm.     Heart sounds: No murmur heard. Pulmonary:     Effort: Pulmonary effort is normal. No respiratory distress.     Breath sounds: Normal breath sounds.   Abdominal:     Palpations: Abdomen is soft.     Tenderness: There is no abdominal tenderness.  Musculoskeletal:        General: No swelling.     Cervical back: Neck supple.     Right lower leg: Edema present.     Left lower leg: Edema present.  Skin:    General: Skin is warm and dry.     Capillary Refill: Capillary refill takes less than 2 seconds.  Neurological:     Mental Status: He is alert.  Psychiatric:        Mood and Affect: Mood normal.     ED Results and Treatments Labs (all labs ordered are listed, but only abnormal results are displayed) Labs Reviewed  COMPREHENSIVE METABOLIC PANEL - Abnormal; Notable for the following components:      Result Value   Sodium 134 (*)    Glucose, Bld 107 (*)    Calcium 8.7 (*)    Albumin 3.3 (*)    Anion gap 3 (*)    All other components within normal limits  CBC WITH DIFFERENTIAL/PLATELET - Abnormal; Notable for the following components:   RBC 3.33 (*)    Hemoglobin 10.2 (*)    HCT 31.2 (*)    Platelets 121 (*)    All other components within normal limits  BRAIN NATRIURETIC PEPTIDE - Abnormal; Notable for the following components:   B Natriuretic Peptide 711.0 (*)    All other components within normal limits  TROPONIN I (HIGH SENSITIVITY)  Radiology No results found.  Pertinent labs & imaging results that were available during my care of the patient were reviewed by me and considered in my medical decision making (see MDM for details).  Medications Ordered in ED Medications  furosemide (LASIX) injection 20 mg (20 mg Intravenous Given 10/19/21 0422)                                                                                                                                     Procedures Procedures  (including critical care time)  Medical Decision Making / ED Course   This patient presents to the  ED for concern of leg swelling, this involves an extensive number of treatment options, and is a complaint that carries with it a high risk of complications and morbidity.  The differential diagnosis includes peripheral edema, gravity dependent edema, increased sodium intake, CHF exacerbation, hypoalbuminemia, renal failure  MDM: Seen in the emergency department for evaluation of leg swelling.  Physical exam with bilateral lower extremity 2+ pitting edema but is otherwise unremarkable.  Laboratory evaluation with a hemoglobin of 10.2, mild hypoalbuminemia to 3.3 and a BNP elevated to 711.  Patient is saturating 95% on room air with no rales heard on exam and I have low suspicion for pulmonary edema at this time.  Patient presentation consistent with lower extremity peripheral edema likely secondary to increased sodium intake.  His legs were wrapped with Ace wrap's and he was given a single dose of 20 mg IV Lasix.  He will be discharged with a short course of oral Lasix and was instructed to take this until his leg swelling resolves.  He was instructed to follow-up with his primary care physician or return the emergency department if he has worsening leg swelling, difficulty breathing, chest pain or any other concerning symptoms of which he voiced understanding.   Additional history obtained: -Additional history obtained from son -External records from outside source obtained and reviewed including: Chart review including previous notes, labs, imaging, consultation notes   Lab Tests: -I ordered, reviewed, and interpreted labs.   The pertinent results include:   Labs Reviewed  COMPREHENSIVE METABOLIC PANEL - Abnormal; Notable for the following components:      Result Value   Sodium 134 (*)    Glucose, Bld 107 (*)    Calcium 8.7 (*)    Albumin 3.3 (*)    Anion gap 3 (*)    All other components within normal limits  CBC WITH DIFFERENTIAL/PLATELET - Abnormal; Notable for the following components:    RBC 3.33 (*)    Hemoglobin 10.2 (*)    HCT 31.2 (*)    Platelets 121 (*)    All other components within normal limits  BRAIN NATRIURETIC PEPTIDE - Abnormal; Notable for the following components:   B Natriuretic Peptide 711.0 (*)    All other components within normal limits  TROPONIN I (HIGH SENSITIVITY)  EKG   EKG Interpretation  Date/Time:  Monday October 19 2021 03:05:58 EDT Ventricular Rate:  60 PR Interval:  199 QRS Duration: 97 QT Interval:  475 QTC Calculation: 475 R Axis:   -15 Text Interpretation: Sinus rhythm Confirmed by Cloa Bushong ([id number]) on 10/19/2021 6:00:35 AM        Medicines ordered and prescription drug management: Meds ordered this encounter  Medications   furosemide (LASIX) injection 20 mg   furosemide (LASIX) 20 MG tablet    Sig: Take 1 tablet (20 mg total) by mouth 2 (two) times daily.    Dispense:  30 tablet    Refill:  0    -I have reviewed the patients home medicines and have made adjustments as needed  Critical interventions none   Cardiac Monitoring: The patient was maintained on a cardiac monitor.  I personally viewed and interpreted the cardiac monitored which showed an underlying rhythm of: NSR  Social Determinants of Health:  Factors impacting patients care include: none   Reevaluation: After the interventions noted above, I reevaluated the patient and found that they have :improved  Co morbidities that complicate the patient evaluation  Past Medical History:  Diagnosis Date   Diabetes mellitus without complication (HCC)    Patient is not on any medication for this   GERD (gastroesophageal reflux disease)    Hypertension    Pancreatitis, alcoholic, acute    Portal vein thrombosis 07/01/2021   Splenic vein thrombosis    Stroke [facility])    mini strokes      Dispostion: I considered admission for this patient, but he currently does not meet inpatient criteria for admission is safe for discharge with outpatient  follow-up     Final Clinical Impression(s) / ED Diagnoses Final diagnoses:  Leg edema     @PCDICTATION @    , MD 10/19/21 [phone]

## 2021-10-26 NOTE — Progress Notes (Unsigned)
Cardiology Office Note:    Date:  10/27/2021   ID:  Jon Bennett, DOB 1934/10/13, MRN [id number]  PCP:  Neale Burly, MD   [place] [facility] Cardiologist:  Carlyle Dolly, MD     Referring MD: Neale Burly, MD   Chief Complaint: hospital follow-up  History of Present Illness:    Jon Bennett is a pleasant 87 y.o. male with a hx of clinic and portal vein thrombosis (on anticoagulation), HTN, HLD, GERD, alcohol use disorder, and prior TIA  He was last seen in our office on 09/02/2021 by Bernerd Pho, [place] for post hospital follow-up.  Admitted with cholangitis in 07/2021 prior cholecystectomy on 08/06/2021.  Was discharged home but presented back on 08/09/2021 for recurrent abdominal pain and his bilirubin was initially elevated but relieved without intervention.  Cardiology was consulted as he was noted to have episodes of atrial fibrillation with RVR.  Given hypotension was started on IV amiodarone with plans to remain on p.o. amiodarone for a course of 6 weeks.  Eliquis was continued.  He converted to NSR prior to discharge. At office visit, he remained at rehab.  No return of A-fib to his awareness, asymptomatic. BP remains soft but he denied dizziness or presyncope.  Reported a history of hypertension in the past previously on midodrine 5 mg as needed. Midodrine was added back to medication list.  Advised to discontinue amiodarone 09/23/2021 and return for follow-up in 6 months.  Seen in ED 10/19/21 for leg swelling.  Physical exam reported bilateral lower extremity 2+ pitting edema.  NP 711, O2 saturation 95% on RA, low suspicion for pulmonary edema. Admitted to high sodium intake. Legs were wrapped with ACE bandage, one dose of IV Lasix 20 mg and was advised to take oral Lasix until leg swelling resolves.   Today, he is here with his son for follow-up. Reports feeling well and leg swelling has improved. Was eating [employer] soup and saltine crackers daily which he thinks was  contributing to leg swelling. One night recently woke up with bilateral legs feeling "wet" thinks maybe he had too many blankets on him. Is active around the house, up and down stairs and in and out of the house. No formal exercise. He denies chest pain, shortness of breath, lower extremity edema, fatigue, palpitations, melena, hematuria, hemoptysis, diaphoresis, weakness, presyncope, syncope, orthopnea, and PND.   Past Medical History:  Diagnosis Date   Diabetes mellitus without complication ([place])    Patient is not on any medication for this   GERD (gastroesophageal reflux disease)    Hypertension    Pancreatitis, alcoholic, acute    Portal vein thrombosis 07/01/2021   Splenic vein thrombosis    Stroke [facility])    mini strokes    Past Surgical History:  Procedure Laterality Date   APPENDECTOMY     CAROTID ANGIOGRAPHY     CHOLECYSTECTOMY N/A 08/06/2021   Procedure: LAPAROSCOPIC CHOLECYSTECTOMY;  Surgeon: Aviva Signs, MD;  Location: [facility] ORS;  Service: General;  Laterality: N/A;   ERCP N/A 07/03/2021   Procedure: ENDOSCOPIC RETROGRADE CHOLANGIOPANCREATOGRAPHY (ERCP);  Surgeon: Jackquline Denmark, MD;  Location: [facility] ENDOSCOPY;  Service: Gastroenterology;  Laterality: N/A;   esophageal tear repair     ESOPHAGOGASTRODUODENOSCOPY (EGD) WITH PROPOFOL N/A 06/25/2021   Procedure: ESOPHAGOGASTRODUODENOSCOPY (EGD) WITH PROPOFOL;  Surgeon: Rogene Houston, MD;  Location: [facility] ENDO SUITE;  Service: Endoscopy;  Laterality: N/A;   REMOVAL OF STONES  07/03/2021   Procedure: REMOVAL OF STONES;  Surgeon: Jackquline Denmark,  MD;  Location: [facility] ENDOSCOPY;  Service: Gastroenterology;;   Dennison MascotSPHINCTEROTOMY  07/03/2021   Procedure: Dennison MascotSPHINCTEROTOMY;  Surgeon: Lynann BolognaGupta, Rajesh, MD;  [facility];  Service: Gastroenterology;;   VASCULAR SURGERY      Current Medications: Current Meds  Medication Sig   acamprosate (CAMPRAL) 333 MG tablet Take 666 mg by mouth 3 (three) times daily.   acetaminophen (TYLENOL) 500 MG tablet  Take 500 mg by mouth daily as needed for mild pain or moderate pain.   amiodarone (PACERONE) 200 MG tablet Take 1 tablet (200 mg total) by mouth daily.   busPIRone (BUSPAR) 10 MG tablet Take 10 mg by mouth 2 (two) times daily.   diclofenac Sodium (VOLTAREN) 1 % GEL Apply 4 g topically in the morning and at bedtime. Apply to left knee   doxepin (SINEQUAN) 25 MG capsule Take 25 mg by mouth at bedtime.   ELIQUIS 5 MG TABS tablet TAKE 1 TABLET BY MOUTH TWICE A DAY (Patient taking differently: Take 5 mg by mouth 2 (two) times daily.)   famotidine (PEPCID) 20 MG tablet Take 20 mg by mouth 2 (two) times daily.   folic acid (FOLVITE) 1 MG tablet Take 1 tablet (1 mg total) by mouth daily.   metoprolol tartrate (LOPRESSOR) 25 MG tablet Take 0.5 tablets (12.5 mg total) by mouth 2 (two) times daily.   mirtazapine (REMERON) 15 MG tablet Take 15 mg by mouth at bedtime.   Multiple Vitamin (MULTIVITAMIN WITH MINERALS) TABS tablet Take 1 tablet by mouth daily.   ondansetron (ZOFRAN) 4 MG tablet Take 4 mg by mouth every 8 (eight) hours as needed for nausea or vomiting.   ondansetron (ZOFRAN-ODT) 4 MG disintegrating tablet Take 1 tablet (4 mg total) by mouth every 8 (eight) hours as needed for nausea or vomiting.   sertraline (ZOLOFT) 25 MG tablet Take 1 tablet by mouth daily at 6 (six) AM.   tamsulosin (FLOMAX) 0.4 MG CAPS capsule Take 0.4 mg by mouth daily.   thiamine 100 MG tablet Take 1 tablet (100 mg total) by mouth daily.   [DISCONTINUED] furosemide (LASIX) 20 MG tablet Take 1 tablet (20 mg total) by mouth 2 (two) times daily.     Allergies:   Asa [aspirin]   Social History   Socioeconomic History   Marital status: Widowed    Spouse name: Not on file   Number of children: 4   Years of education: Not on file   Highest education level: Not on file  Occupational History   Occupation: retired  Tobacco Use   Smoking status: Never   Smokeless tobacco: Never  Vaping Use   Vaping Use: Never used   Substance and Sexual Activity   Alcohol use: Not Currently    Alcohol/week: 1.0 standard drink of alcohol    Types: 1 Shots of liquor per week   Drug use: Never   Sexual activity: Not Currently  Other Topics Concern   Not on file  Social History Narrative   Not on file   Social Determinants of Health   Financial Resource Strain: Low Risk  (02/21/2020)   Overall Financial Resource Strain (CARDIA)    Difficulty of Paying Living Expenses: Not hard at all  Food Insecurity: No Food Insecurity (02/21/2020)   Hunger Vital Sign    Worried About Running Out of Food in the Last Year: Never true    Ran Out of Food in the Last Year: Never true  Transportation Needs: No Transportation Needs (02/21/2020)   PRAPARE -  Administrator, Civil Service (Medical): No    Lack of Transportation (Non-Medical): No  Physical Activity: Insufficiently Active (02/21/2020)   Exercise Vital Sign    Days of Exercise per Week: 3 days    Minutes of Exercise per Session: 30 min  Stress: No Stress Concern Present (02/21/2020)   [employer] of Occupational Health - Occupational Stress Questionnaire    Feeling of Stress : Not at all  Social Connections: Moderately Integrated (02/21/2020)   Social Connection and Isolation Panel [NHANES]    Frequency of Communication with Friends and Family: More than three times a week    Frequency of Social Gatherings with Friends and Family: More than three times a week    Attends Religious Services: 1 to 4 times per year    Active Member of [employer] or Organizations: No    Attends Banker Meetings: 1 to 4 times per year    Marital Status: Widowed     Family History: The patient's family history includes Cancer in his maternal grandfather; Healthy in his brother, brother, brother, brother, sister, sister, sister, sister, son, son, son, and son; Heart disease in his father; Hypertension in an other family member.  ROS:   Please see the history of present  illness.    All other systems reviewed and are negative.  Labs/Other Studies Reviewed:    The following studies were reviewed today:  Echo 08/11/21   1. Left ventricular ejection fraction, by estimation, is 60 to 65%. The  left ventricle has normal function. The left ventricle has no regional  wall motion abnormalities. There is mild left ventricular hypertrophy.  Left ventricular diastolic parameters  are indeterminate. Elevated left atrial pressure.   2. Right ventricular systolic function is normal. The right ventricular  size is normal.   3. The mitral valve is normal in structure. No evidence of mitral valve  regurgitation. No evidence of mitral stenosis.   4. The tricuspid valve is abnormal.   5. The aortic valve is tricuspid. Aortic valve regurgitation is not  visualized. No aortic stenosis is present.   Recent Labs: 08/09/2021: TSH 0.970 08/11/2021: Magnesium 1.9 10/19/2021: ALT 13; B Natriuretic Peptide 711.0; BUN 18; Creatinine, Ser 0.92; Hemoglobin 10.2; Platelets 121; Potassium 4.1; Sodium 134  Recent Lipid Panel No results found for: "CHOL", "TRIG", "HDL", "CHOLHDL", "VLDL", "LDLCALC", "LDLDIRECT"   Risk Assessment/Calculations:    CHA2DS2-VASc Score = 4   This indicates a 4.8% annual risk of stroke. The patient's score is based upon: CHF History: 0 HTN History: 1 Diabetes History: 1 Stroke History: 0 Vascular Disease History: 0 Age Score: 2 Gender Score: 0    Physical Exam:    VS:  BP (!) 110/58   Pulse (!) 49   Ht 5\' 9"  (1.753 m)   Wt 141 lb (64 kg)   SpO2 97%   BMI 20.82 kg/m     Wt Readings from Last 3 Encounters:  10/27/21 141 lb (64 kg)  10/19/21 156 lb 14.4 oz (71.2 kg)  08/11/21 157 lb 10.1 oz (71.5 kg)     GEN: Thin, elderly male in no acute distress HEENT: Normal NECK: No JVD; No carotid bruits CARDIAC: RRR, no murmurs, rubs, gallops RESPIRATORY:  Clear to auscultation without rales, wheezing or rhonchi  ABDOMEN: Soft, non-tender,  non-distended MUSCULOSKELETAL:  No edema; No deformity. 2+ pedal pulses, equal bilaterally SKIN: Warm and dry NEUROLOGIC:  Alert and oriented x 3 PSYCHIATRIC:  Normal affect   EKG:  EKG  is not ordered today per patient request  Diagnoses:    1. Paroxysmal atrial fibrillation with RVR (HCC)   2. Chronic anticoagulation   3. Essential hypertension   4. Orthostatic hypotension    Assessment and Plan:      PAF on chronic anticoagulation: Heart rate and rhythm regular on exam today.  He requested that we not do an EKG unless necessary. No palpitations, tachycardia, or chest pain.  No bleeding problems on Eliquis. Current Eliquis dose is appropriate. We discussed that if weight drops < 60 kg, that we will need to reduce his dose. Continue Eliquis, amiodarone, low dose metoprolol.   Leg edema: Improved with reduced sodium diet and Lasix 40 mg daily.  We will reduce Lasix to 20 mg daily. Advised continued regular physical activity, low sodium diet, and leg elevation when sedentary.   History of hypertension/Hypotension: BP is stable today. He is no longer taking midodrine. No lightheadedness, presyncope, syncope.  Advised him if SBP < 100 mmHg, to take midodrine 5 mg.      Disposition: Keep your December appointment with Dr. Dominga Ferry  Medication Adjustments/Labs and Tests Ordered: Current medicines are reviewed at length with the patient today.  Concerns regarding medicines are outlined above.  Orders Placed This Encounter  Procedures   EKG 12-Lead   Meds ordered this encounter  Medications   furosemide (LASIX) 20 MG tablet    Sig: Take 1 tablet (20 mg total) by mouth daily.    Dispense:  30 tablet    Refill:  6    Patient Instructions  Medication Instructions:   DECREASE Lasix one (1) tablet by mouth (20 mg) daily.   *If you need a refill on your cardiac medications before your next appointment, please call your pharmacy*   Lab Work:  None ordered.  If you have labs  (blood work) drawn today and your tests are completely normal, you will receive your results only by: MyChart Message (if you have MyChart) OR A paper copy in the mail If you have any lab test that is abnormal or we need to change your treatment, we will call you to review the results.   Testing/Procedures:  None ordered.   Follow-Up: At [facility], you and your health needs are our priority.  As part of our continuing mission to provide you with exceptional heart care, we have created designated Provider Care Teams.  These Care Teams include your primary Cardiologist (physician) and Advanced Practice Providers (APPs -  Physician Assistants and Nurse Practitioners) who all work together to provide you with the care you need, when you need it.  We recommend signing up for the patient portal called "MyChart".  Sign up information is provided on this After Visit Summary.  MyChart is used to connect with patients for Virtual Visits (Telemedicine).  Patients are able to view lab/test results, encounter notes, upcoming appointments, etc.  Non-urgent messages can be sent to your provider as well.   To learn more about what you can do with MyChart, go to [email or web address].    Your next appointment:   4 month(s)  The format for your next appointment:   In Person  Provider:   Dina Rich, MD    Important Information About Sugar         Signed, Levi Aland, NP  10/27/2021 4:55 PM    [facility]

## 2021-10-27 ENCOUNTER — Encounter: Payer: Self-pay | Admitting: Nurse Practitioner

## 2021-10-27 ENCOUNTER — Ambulatory Visit: Payer: Medicare Other | Admitting: Nurse Practitioner

## 2021-10-27 VITALS — BP 110/58 | HR 49 | Ht 69.0 in | Wt 141.0 lb

## 2021-10-27 DIAGNOSIS — Z7901 Long term (current) use of anticoagulants: Secondary | ICD-10-CM | POA: Diagnosis not present

## 2021-10-27 DIAGNOSIS — I951 Orthostatic hypotension: Secondary | ICD-10-CM | POA: Diagnosis not present

## 2021-10-27 DIAGNOSIS — I10 Essential (primary) hypertension: Secondary | ICD-10-CM | POA: Diagnosis not present

## 2021-10-27 DIAGNOSIS — I480 Paroxysmal atrial fibrillation: Secondary | ICD-10-CM

## 2021-10-27 MED ORDER — FUROSEMIDE 20 MG PO TABS: 20.0000 mg | ORAL_TABLET | Freq: Every day | ORAL | 6 refills | Status: DC

## 2021-10-27 NOTE — Patient Instructions (Signed)
Medication Instructions:   DECREASE Lasix one (1) tablet by mouth (20 mg) daily.   *If you need a refill on your cardiac medications before your next appointment, please call your pharmacy*   Lab Work:  None ordered.  If you have labs (blood work) drawn today and your tests are completely normal, you will receive your results only by: MyChart Message (if you have MyChart) OR A paper copy in the mail If you have any lab test that is abnormal or we need to change your treatment, we will call you to review the results.   Testing/Procedures:  None ordered.   Follow-Up: At [facility], you and your health needs are our priority.  As part of our continuing mission to provide you with exceptional heart care, we have created designated Provider Care Teams.  These Care Teams include your primary Cardiologist (physician) and Advanced Practice Providers (APPs -  Physician Assistants and Nurse Practitioners) who all work together to provide you with the care you need, when you need it.  We recommend signing up for the patient portal called "MyChart".  Sign up information is provided on this After Visit Summary.  MyChart is used to connect with patients for Virtual Visits (Telemedicine).  Patients are able to view lab/test results, encounter notes, upcoming appointments, etc.  Non-urgent messages can be sent to your provider as well.   To learn more about what you can do with MyChart, go to [email or web address].    Your next appointment:   4 month(s)  The format for your next appointment:   In Person  Provider:   Dina Rich, MD    Important Information About Sugar

## 2022-01-14 DIAGNOSIS — Z6826 Body mass index (BMI) 26.0-26.9, adult: Secondary | ICD-10-CM | POA: Diagnosis not present

## 2022-01-14 DIAGNOSIS — I10 Essential (primary) hypertension: Secondary | ICD-10-CM | POA: Diagnosis not present

## 2022-01-14 DIAGNOSIS — M542 Cervicalgia: Secondary | ICD-10-CM | POA: Diagnosis not present

## 2022-03-11 ENCOUNTER — Ambulatory Visit: Payer: Medicare Other | Admitting: Cardiology

## 2022-03-18 ENCOUNTER — Encounter: Payer: Self-pay | Admitting: Nurse Practitioner

## 2022-03-18 ENCOUNTER — Ambulatory Visit: Payer: Medicare Other | Attending: Cardiology | Admitting: Nurse Practitioner

## 2022-03-18 VITALS — BP 124/72 | HR 50 | Ht 69.0 in | Wt 160.8 lb

## 2022-03-18 DIAGNOSIS — I81 Portal vein thrombosis: Secondary | ICD-10-CM

## 2022-03-18 DIAGNOSIS — I4891 Unspecified atrial fibrillation: Secondary | ICD-10-CM

## 2022-03-18 DIAGNOSIS — I10 Essential (primary) hypertension: Secondary | ICD-10-CM | POA: Diagnosis not present

## 2022-03-18 DIAGNOSIS — R001 Bradycardia, unspecified: Secondary | ICD-10-CM | POA: Diagnosis not present

## 2022-03-18 DIAGNOSIS — R600 Localized edema: Secondary | ICD-10-CM

## 2022-03-18 NOTE — Progress Notes (Deleted)
Cardiology Office Note:    Date:  03/18/2022   ID:  Jon Bennett, DOB 10/25/1934, MRN [id number]  PCP:  Neale Burly, MD   [place] Prov[facility] Cardiologist:  Carlyle Dolly, MD { Click to update primary MD,subspecialty MD or APP then REFRESH:1}    Referring MD: Neale Burly, MD   No chief complaint on file. ***  History of Present Illness:    Jon Bennett is a 88 y.o. male with a hx of ***  Portal vein thrombosis (on [facility]) Hyperlipidemia Hypertension GERD Prior TIA Alcohol use disorder  Was admitted in May 2023 for cholangitis and presented back with abdominal pain a few days later to hospital.  Bilirubin was elevated but relieved without intervention.  Cardiology was consulted as he was noted to have episodes of A-fib with RVR.  Was started on amiodarone, converted to normal sinus rhythm prior to discharge.  He was advised to discontinue amiodarone in July 2023 and return for follow-up in 6 months.  Was seen in the ED in August 2023 for leg swelling.  Patient admitted to high sodium intake.  He was given IV Lasix and advised to take oral Lasix until leg swelling resolved.  Last seen by Christen Bame, NP on October 27, 2021.  Was overall doing well from a cardiac perspective and denied any acute cardiac complaints.  Lasix was reduced to 20 mg daily.  No other medication changes were made.  Discussed to follow-up with Dr. Carlyle Dolly or APP in 4 to 5 months or sooner if anything change.  Today presents for follow-up.  He states.   Past Medical History:  Diagnosis Date   Diabetes mellitus without complication ([place])    Patient is not on any medication for this   GERD (gastroesophageal reflux disease)    Hypertension    Pancreatitis, alcoholic, acute    Portal vein thrombosis 07/01/2021   Splenic vein thrombosis    Stroke [facility])    mini strokes    Past Surgical History:  Procedure Laterality Date   APPENDECTOMY     CAROTID ANGIOGRAPHY      CHOLECYSTECTOMY N/A 08/06/2021   Procedure: LAPAROSCOPIC CHOLECYSTECTOMY;  Surgeon: Aviva Signs, MD;  Location: [facility] ORS;  Service: General;  Laterality: N/A;   ERCP N/A 07/03/2021   Procedure: ENDOSCOPIC RETROGRADE CHOLANGIOPANCREATOGRAPHY (ERCP);  Surgeon: Jackquline Denmark, MD;  Location: [facility] ENDOSCOPY;  Service: Gastroenterology;  Laterality: N/A;   esophageal tear repair     ESOPHAGOGASTRODUODENOSCOPY (EGD) WITH PROPOFOL N/A 06/25/2021   Procedure: ESOPHAGOGASTRODUODENOSCOPY (EGD) WITH PROPOFOL;  Surgeon: Rogene Houston, MD;  [facility] ENDO SU[facility];  Service: Endoscopy;  Laterality: N/A;   REMOVAL OF STONES  07/03/2021   Procedure: REMOVAL OF STONES;  Surgeon: Jackquline Denmark, MD;  Location: [facility] ENDOSCOPY;  Service: Gastroenterology;;   [name]  07/03/2021   Procedure: [name];  Surgeon: Jackquline Denmark, MD;  Location: [facility] ENDOSCOPY;  Service: Gastroenterology;;   VASCULAR SURGERY      Current Medications: No outpatient medications have been marked as taking for the 03/18/22 encounter (Appointment) with Finis Bud, NP.     Allergies:   Asa [aspirin]   Social History   Socioeconomic History   Marital status: Widowed    Spouse name: Not on file   Number of children: 4   Years of education: Not on file   Highest education level: Not on file  Occupational History   Occupation: retired  Tobacco Use   Smoking status: Never   Smokeless tobacco:  Never  Vaping Use   Vaping Use: Never used  Substance and Sexual Activity   Alcohol use: Not Currently    Alcohol/week: 1.0 standard drink of alcohol    Types: 1 Shots of liquor per week   Drug use: Never   Sexual activity: Not Currently  Other Topics Concern   Not on file  Social History Narrative   Not on file   Social Determinants of Health   Financial Resource Strain: Low Risk  (02/21/2020)   Overall Financial Resource Strain (CARDIA)    Difficulty of Paying Living Expenses: Not hard at all  Food Insecurity: No Food  Insecurity (02/21/2020)   Hunger Vital Sign    Worried About Running Out of Food in the Last Year: Never true    Ran Out of Food in the Last Year: Never true  Transportation Needs: No Transportation Needs (02/21/2020)   PRAPARE - Administrator, Civil Service (Medical): No    Lack of Transportation (Non-Medical): No  Physical Activity: Insufficiently Active (02/21/2020)   Exercise Vital Sign    Days of Exercise per Week: 3 days    Minutes of Exercise per Session: 30 min  Stress: No Stress Concern Present (02/21/2020)   [employer] of Occupational Health - Occupational Stress Questionnaire    Feeling of Stress : Not at all  Social Connections: Moderately Integrated (02/21/2020)   Social Connection and Isolation Panel [NHANES]    Frequency of Communication with Friends and Family: More than three times a week    Frequency of Social Gatherings with Friends and Family: More than three times a week    Attends Religious Services: 1 to 4 times per year    Active Member of [employer] or Organizations: No    Attends Banker Meetings: 1 to 4 times per year    Marital Status: Widowed     Family History: The patient's ***family history includes Cancer in his maternal grandfather; Healthy in his brother, brother, brother, brother, sister, sister, sister, sister, son, son, son, and son; Heart disease in his father; Hypertension in an other family member.  ROS:   Please see the history of present illness.    *** All other systems reviewed and are negative.  EKGs/Labs/Other Studies Reviewed:    The following studies were reviewed today: ***  EKG:  EKG is *** ordered today.  The ekg ordered today demonstrates ***  Recent Labs: 08/09/2021: TSH 0.970 08/11/2021: Magnesium 1.9 10/19/2021: ALT 13; B Natriuretic Peptide 711.0; BUN 18; Creatinine, Ser 0.92; Hemoglobin 10.2; Platelets 121; Potassium 4.1; Sodium 134  Recent Lipid Panel No results found for: "CHOL", "TRIG", "HDL",  "CHOLHDL", "VLDL", "LDLCALC", "LDLDIRECT"   Risk Assessment/Calculations:   {Does this patient have ATRIAL FIBRILLATION?[phone]}  No BP recorded.  {Refresh Note OR Click here to enter BP  :1}***         Physical Exam:    VS:  There were no vitals taken for this visit.    Wt Readings from Last 3 Encounters:  10/27/21 141 lb (64 kg)  10/19/21 156 lb 14.4 oz (71.2 kg)  08/11/21 157 lb 10.1 oz (71.5 kg)     GEN: *** Well nourished, well developed in no acute distress HEENT: Normal NECK: No JVD; No carotid bruits LYMPHATICS: No lymphadenopathy CARDIAC: ***RRR, no murmurs, rubs, gallops RESPIRATORY:  Clear to auscultation without rales, wheezing or rhonchi  ABDOMEN: Soft, non-tender, non-distended MUSCULOSKELETAL:  No edema; No deformity  SKIN: Warm and dry NEUROLOGIC:  Alert  and oriented x 3 PSYCHIATRIC:  Normal affect   ASSESSMENT:    No diagnosis found. PLAN:    In order of problems listed above:  ***      {Are you ordering a CV Procedure (e.g. stress test, cath, DCCV, TEE, etc)?   Press F2        :[id number]}    Medication Adjustments/Labs and Tests Ordered: Current medicines are reviewed at length with the patient today.  Concerns regarding medicines are outlined above.  No orders of the defined types were placed in this encounter.  No orders of the defined types were placed in this encounter.   There are no Patient Instructions on file for this visit.   SignedFinis Bud, NP  03/18/2022 12:57 PM    [place]

## 2022-03-18 NOTE — Patient Instructions (Addendum)
Medication Instructions:  Stop Amiodarone (Pacerone) Continue all other medications.     Labwork: none  Testing/Procedures: none  Follow-Up: 3 months   Any Other Special Instructions Will Be Listed Below (If Applicable).   If you need a refill on your cardiac medications before your next appointment, please call your pharmacy.

## 2022-03-18 NOTE — Progress Notes (Signed)
Cardiology Office Note:    Date:  03/18/2022  ID:  Jon Bennett, DOB Jun 15, 1934, MRN [id number]  PCP:  Jon Burly, MD   [facility] Cardiologist:  Jon Dolly, MD     Referring MD: Jon Burly, MD   CC: Here for follow-up  History of Present Illness:    Jon Bennett is a 88 y.o. male with a hx of the following:  A-fib Portal vein thrombosis (on [facility]) Hyperlipidemia Hypertension GERD Prior TIA Alcohol use disorder  Was admitted in May 2023 for cholangitis and presented back with abdominal pain a few days later to hospital.  Bilirubin was elevated but relieved without intervention.  Cardiology was consulted as he was noted to have episodes of A-fib with RVR.  Was started on amiodarone, converted to normal sinus rhythm prior to discharge.  He was advised to discontinue amiodarone in July 2023 and return for follow-up in 6 months.  Was seen in the ED in August 2023 for leg swelling.  Patient admitted to high sodium intake.  He was given IV Lasix and advised to take oral Lasix until leg swelling resolved.  Last seen by Jon Bame, NP on October 27, 2021.  Was overall doing well from a cardiac perspective and denied any acute cardiac complaints.  Lasix was reduced to 20 mg daily.  No other medication changes were made.  Discussed to follow-up with Dr. Carlyle Bennett or APP in 4 to 5 months or sooner if anything change.  Today presents for follow-up with his son.  He states he is doing well and denies any cardiac complaints or concerns.  Denies any rest pain, shortness of breath, palpitations, syncope, presyncope, dizziness, orthopnea, PND, swelling, significant weight changes, acute bleeding, claudication.  Lives with son who is present for visit today.  Past Medical History:  Diagnosis Date   Diabetes mellitus without complication ([place])    Patient is not on any medication for this   GERD (gastroesophageal reflux disease)    Hypertension     Pancreatitis, alcoholic, acute    Portal vein thrombosis 07/01/2021   Splenic vein thrombosis    Stroke [facility])    mini strokes    Past Surgical History:  Procedure Laterality Date   APPENDECTOMY     CAROTID ANGIOGRAPHY     CHOLECYSTECTOMY N/A 08/06/2021   Procedure: LAPAROSCOPIC CHOLECYSTECTOMY;  Surgeon: Jon Signs, MD;  Location: [facility] ORS;  Service: General;  Laterality: N/A;   ERCP N/A 07/03/2021   Procedure: ENDOSCOPIC RETROGRADE CHOLANGIOPANCREATOGRAPHY (ERCP);  Surgeon: Jon Denmark, MD;  Location: [facility] ENDOSCOPY;  Service: Gastroenterology;  Laterality: N/A;   esophageal tear repair     ESOPHAGOGASTRODUODENOSCOPY (EGD) WITH PROPOFOL N/A 06/25/2021   Procedure: ESOPHAGOGASTRODUODENOSCOPY (EGD) WITH PROPOFOL;  Surgeon: Jon Houston, MD;  Location: [facility] ENDO SUITE;  Service: Endoscopy;  Laterality: N/A;   REMOVAL OF STONES  07/03/2021   Procedure: REMOVAL OF STONES;  Surgeon: Jon Denmark, MD;  Location: [facility] ENDOSCOPY;  Service: Gastroenterology;;   [name]  07/03/2021   Procedure: [name];  Surgeon: Jon Denmark, MD;  Location: [facility] ENDOSCOPY;  Service: Gastroenterology;;   VASCULAR SURGERY      Current Medications: Current Meds  Medication Sig   acamprosate (CAMPRAL) 333 MG tablet Take 666 mg by mouth 3 (three) times daily.   acetaminophen (TYLENOL) 500 MG tablet Take 500 mg by mouth daily as needed for mild pain or moderate pain.   busPIRone (BUSPAR) 10 MG tablet Take 10 mg by mouth 2 (  two) times daily.   doxepin (SINEQUAN) 25 MG capsule Take 25 mg by mouth at bedtime.   ELIQUIS 5 MG TABS tablet TAKE 1 TABLET BY MOUTH TWICE A DAY (Patient taking differently: Take 5 mg by mouth 2 (two) times daily.)   famotidine (PEPCID) 20 MG tablet Take 20 mg by mouth 2 (two) times daily.   folic acid (FOLVITE) 1 MG tablet Take 1 tablet (1 mg total) by mouth daily.   furosemide (LASIX) 20 MG tablet Take 1 tablet (20 mg total) by mouth daily.   metoprolol tartrate (LOPRESSOR)  25 MG tablet Take 0.5 tablets (12.5 mg total) by mouth 2 (two) times daily.   mirtazapine (REMERON) 15 MG tablet Take 15 mg by mouth at bedtime.   Multiple Vitamin (MULTIVITAMIN WITH MINERALS) TABS tablet Take 1 tablet by mouth daily.   sertraline (ZOLOFT) 25 MG tablet Take 1 tablet by mouth daily at 6 (six) AM.   tamsulosin (FLOMAX) 0.4 MG CAPS capsule Take 0.4 mg by mouth daily.   thiamine 100 MG tablet Take 1 tablet (100 mg total) by mouth daily.    amiodarone (PACERONE) 200 MG tablet Take 1 tablet (200 mg total) by mouth daily.     Allergies:   Asa [aspirin]   Social History   Socioeconomic History   Marital status: Widowed    Spouse name: Not on file   Number of children: 4   Years of education: Not on file   Highest education level: Not on file  Occupational History   Occupation: retired  Tobacco Use   Smoking status: Never   Smokeless tobacco: Never  Vaping Use   Vaping Use: Never used  Substance and Sexual Activity   Alcohol use: Not Currently    Alcohol/week: 1.0 standard drink of alcohol    Types: 1 Shots of liquor per week   Drug use: Never   Sexual activity: Not Currently  Other Topics Concern   Not on file  Social History Narrative   Not on file   Social Determinants of Health   Financial Resource Strain: Low Risk  (02/21/2020)   Overall Financial Resource Strain (CARDIA)    Difficulty of Paying Living Expenses: Not hard at all  Food Insecurity: No Food Insecurity (02/21/2020)   Hunger Vital Sign    Worried About Running Out of Food in the Last Year: Never true    [place] in the Last Year: Never true  Transportation Needs: No Transportation Needs (02/21/2020)   PRAPARE - Hydrologist (Medical): No    Lack of Transportation (Non-Medical): No  Physical Activity: Insufficiently Active (02/21/2020)   Exercise Vital Sign    Days of Exercise per Week: 3 days    Minutes of Exercise per Session: 30 min  Stress: No Stress  Concern Present (02/21/2020)   [place]    Feeling of Stress : Not at all  Social Connections: Moderately Integrated (02/21/2020)   Social Connection and Isolation Panel [NHANES]    Frequency of Communication with Friends and Family: More than three times a week    Frequency of Social Gatherings with Friends and Family: More than three times a week    Attends Religious Services: 1 to 4 times per year    Active Member of [employer] or Organizations: No    Attends Archivist Meetings: 1 to 4 times per year    Marital Status: Widowed  Family History: The patient's family history includes Cancer in his maternal grandfather; Healthy in his brother, brother, brother, brother, sister, sister, sister, sister, son, son, son, and son; Heart disease in his father; Hypertension in an other family member.  ROS:   Review of Systems  Constitutional: Negative.   HENT: Negative.    Eyes: Negative.   Respiratory: Negative.    Cardiovascular: Negative.   Gastrointestinal: Negative.   Genitourinary: Negative.   Musculoskeletal: Negative.   Skin: Negative.   Neurological: Negative.   Endo/Heme/Allergies: Negative.   Psychiatric/Behavioral: Negative.      Please see the history of present illness.    All other systems reviewed and are negative.  EKGs/Labs/Other Studies Reviewed:    The following studies were reviewed today:   EKG:  EKG is not ordered today.     Echocardiogram on 08/11/2021: 1.  Left ventricular ejection fraction by estimation is 60 to 65%.  The left ventricle has normal function.  The left ventricle has no regional wall motion abnormalities.  There is mild left ventricular hypertrophy.  Indeterminate left ventricular diastolic parameters.  Elevated left atrial pressure. 2.  Right ventricular systolic function is normal.  The right ventricular size is normal. 3.  The mitral valve is normal in structure.   No evidence of mitral valve regurgitation.  No evidence of mitral stenosis. 4.  The tricuspid valve is abnormal. 5.  The aortic valve is tricuspid.  Aortic valve regurgitation is not visualized.  No aortic stenosis is present.  Recent Labs: 08/09/2021: TSH 0.970 08/11/2021: Magnesium 1.9 10/19/2021: ALT 13; B Natriuretic Peptide 711.0; BUN 18; Creatinine, Ser 0.92; Hemoglobin 10.2; Platelets 121; Potassium 4.1; Sodium 134  Recent Lipid Panel No results found for: "CHOL", "TRIG", "HDL", "CHOLHDL", "VLDL", "LDLCALC", "LDLDIRECT"   Risk Assessment/Calculations:    CHA2DS2-VASc Score = 4  This indicates a 4.8% annual risk of stroke. The patient's score is based upon: CHF History: 0 HTN History: 1 Diabetes History: 1 Stroke History: 0 Vascular Disease History: 0 Age Score: 2 Gender Score: 0    Physical Exam:    VS:  BP 124/72   Pulse (!) 50   Ht 5\' 9"  (1.753 m)   Wt 160 lb 12.8 oz (72.9 kg)   SpO2 100%   BMI 23.75 kg/m     Wt Readings from Last 3 Encounters:  03/18/22 160 lb 12.8 oz (72.9 kg)  10/27/21 141 lb (64 kg)  10/19/21 156 lb 14.4 oz (71.2 kg)     GEN: Well nourished, well developed in no acute distress HEENT: Normal NECK: No JVD; No carotid bruits CARDIAC: S1/S2, irregular rhythm and slow rate, 52 bpm, no murmurs, rubs, gallops; 2+ pulses throughout RESPIRATORY:  Clear to auscultation without rales, wheezing or rhonchi  MUSCULOSKELETAL:  No edema; thoracic kyphosis noted  SKIN: Thin skin, warm and dry NEUROLOGIC:  Alert and oriented x 3 PSYCHIATRIC:  Normal affect   ASSESSMENT:    1. Atrial fibrillation, unspecified type ([place])   2. Bradycardia   3. Hypertension, unspecified type   4. Portal vein thrombosis   5. Leg edema    PLAN:    In order of problems listed above:  A-fib, bradycardia Slow rate and irregular rhythm on exam today. No palpitations or tachycardia. 52 bpm on exam, asymptomatic. HR was 49 bpm at last visit. Was previously suggested to  stop Amiodarone in July 2023, since had episodes of A-fib with RVR during hospitalization in 07/2021. Will discontinue Amiodarone at this time.  Continue rest  of medication regimen.  Tolerating Eliquis well, denies any bleeding problems.  Currently on appropriate dosage of Eliquis.  If weight drops less than 60 kg in the future, will need to redose Eliquis to 2.5 mg twice daily. Heart healthy diet and regular cardiovascular exercise as tolerated encouraged. If bradycardia continues at next follow-up, plan on arranging monitor.   HTN Blood pressure stable today.  BP well-controlled at home. Discussed to monitor BP at home at least 2 hours after medications and sitting for 5-10 minutes.  Continue current medication regimen. Heart healthy diet and regular cardiovascular exercise as tolerated encouraged.   Hx of portal vein thrombosis Denies any concerning symptoms.  Tolerating Eliquis well.  Continue Eliquis 5 mg twice daily.  Leg edema Denies any leg edema.  Continue current medication regimen.  Low-salt, heart healthy diet and regular cardiovascular exercise as tolerated encouraged.      5. Disposition: Follow-up with me or Dr. Carlyle Bennett in 3 months or sooner if anything changes.    Medication Adjustments/Labs and Tests Ordered: Current medicines are reviewed at length with the patient today.  Concerns regarding medicines are outlined above.  No orders of the defined types were placed in this encounter.  No orders of the defined types were placed in this encounter.   Patient Instructions  Medication Instructions:  Stop Amiodarone (Pacerone) Continue all other medications.     Labwork: none  Testing/Procedures: none  Follow-Up: 3 months   Any Other Special Instructions Will Be Listed Below (If Applicable).   If you need a refill on your cardiac medications before your next appointment, please call your pharmacy.    Signed, Finis Bud, NP  03/19/2022 5:21 PM    [place]

## 2022-05-05 DIAGNOSIS — M47812 Spondylosis without myelopathy or radiculopathy, cervical region: Secondary | ICD-10-CM | POA: Diagnosis not present

## 2022-05-05 DIAGNOSIS — M25512 Pain in left shoulder: Secondary | ICD-10-CM | POA: Diagnosis not present

## 2022-05-05 DIAGNOSIS — M4312 Spondylolisthesis, cervical region: Secondary | ICD-10-CM | POA: Diagnosis not present

## 2022-05-05 DIAGNOSIS — I82513 Chronic embolism and thrombosis of femoral vein, bilateral: Secondary | ICD-10-CM | POA: Diagnosis not present

## 2022-05-05 DIAGNOSIS — K219 Gastro-esophageal reflux disease without esophagitis: Secondary | ICD-10-CM | POA: Diagnosis not present

## 2022-05-05 DIAGNOSIS — R52 Pain, unspecified: Secondary | ICD-10-CM | POA: Diagnosis not present

## 2022-05-05 DIAGNOSIS — M545 Low back pain, unspecified: Secondary | ICD-10-CM | POA: Diagnosis not present

## 2022-05-05 DIAGNOSIS — Z0000 Encounter for general adult medical examination without abnormal findings: Secondary | ICD-10-CM | POA: Diagnosis not present

## 2022-05-05 DIAGNOSIS — M47814 Spondylosis without myelopathy or radiculopathy, thoracic region: Secondary | ICD-10-CM | POA: Diagnosis not present

## 2022-05-05 DIAGNOSIS — M40204 Unspecified kyphosis, thoracic region: Secondary | ICD-10-CM | POA: Diagnosis not present

## 2022-05-05 DIAGNOSIS — M542 Cervicalgia: Secondary | ICD-10-CM | POA: Diagnosis not present

## 2022-05-05 DIAGNOSIS — F3342 Major depressive disorder, recurrent, in full remission: Secondary | ICD-10-CM | POA: Diagnosis not present

## 2022-05-05 DIAGNOSIS — I10 Essential (primary) hypertension: Secondary | ICD-10-CM | POA: Diagnosis not present

## 2022-06-13 IMAGING — MR MR ABDOMEN WO/W CM MRCP
11 of 22 series · 15 of 48 positions shown · IV contrast (gadavist)
Comparison: Right upper quadrant ultrasound 06/29/2021. CT
06/24/2021.

CLINICAL DATA: Nausea and vomiting. Jaundice. Elevated liver
function tests. Biliary duct dilatation. History of diabetes.
Alcohol withdrawal. Splenic vein thrombosis. Gastroesophageal reflux
disease.

EXAM:
MRI ABDOMEN WITHOUT AND WITH CONTRAST (INCLUDING MRCP)
TECHNIQUE: Multiplanar multisequence MR imaging of the abdomen was performed
both before and after the administration of intravenous contrast.
Heavily T2-weighted images of the biliary and pancreatic ducts were
obtained, and three-dimensional MRCP images were rendered by post
processing.
CONTRAST:  7.5mL GADAVIST GADOBUTROL 1 MMOL/ML IV SOLN

[Series 3: cor ssfse nav · coronal · 6.0mm · 0.78mm/px · 1 of 42 slices shown]
[im 1/42]
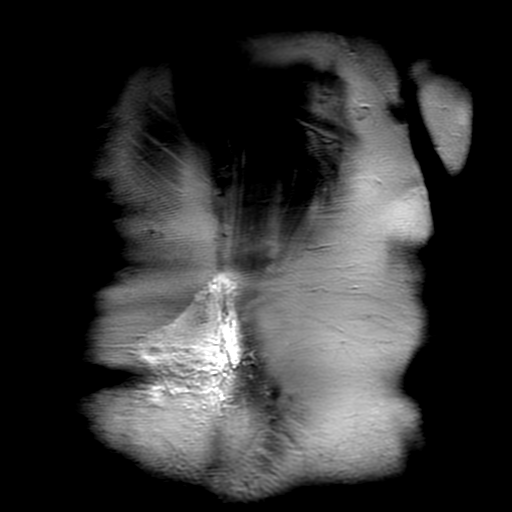

[Series 4: ax ssfse nav · axial · 6.0mm · 0.74mm/px · 1 of 38 slices shown]
[im 1/38]
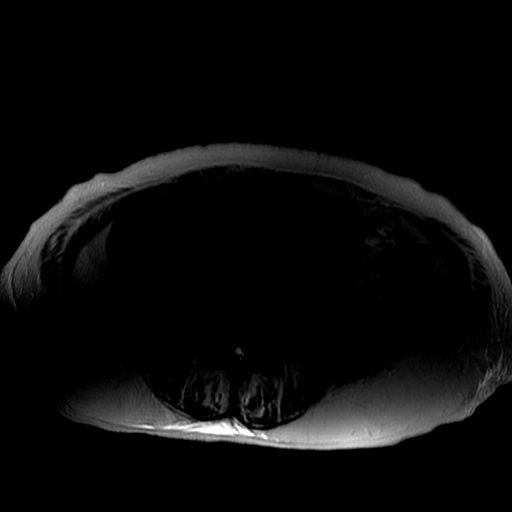

[Series 5: T2 fat-sat · axial · 6.0mm · 0.74mm/px · 1 of 35 slices shown]
[im 1/35]
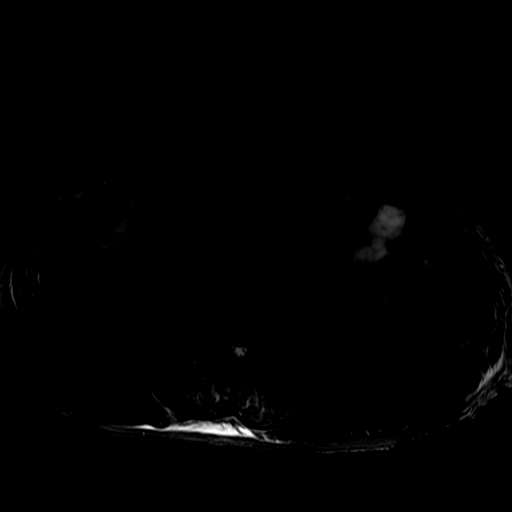

[Series 7: radial 2d thick · coronal · 40.0mm · 0.86mm/px · 1 of 6 slices shown]
[im 1/6]
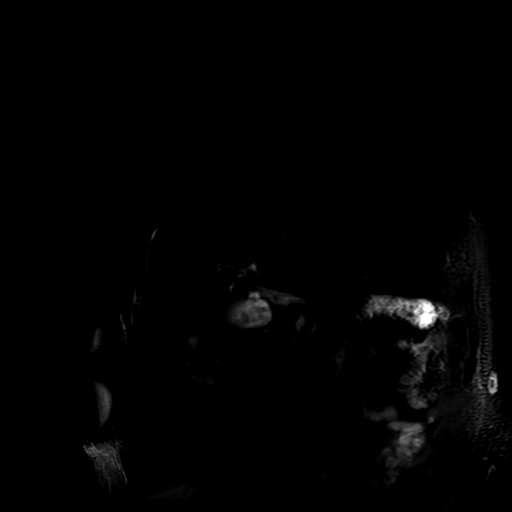

[Series 8: DWI b500 · axial · 8.0mm · 1.48mm/px · 1 of 46 slices shown]
[im 1/46]
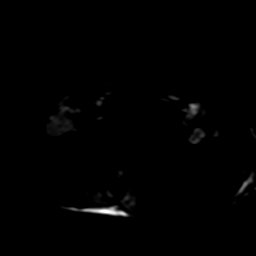

[Series 10: bSSFP · coronal · 6.0mm · 0.78mm/px · 1 of 42 slices shown]
[im 1/42]
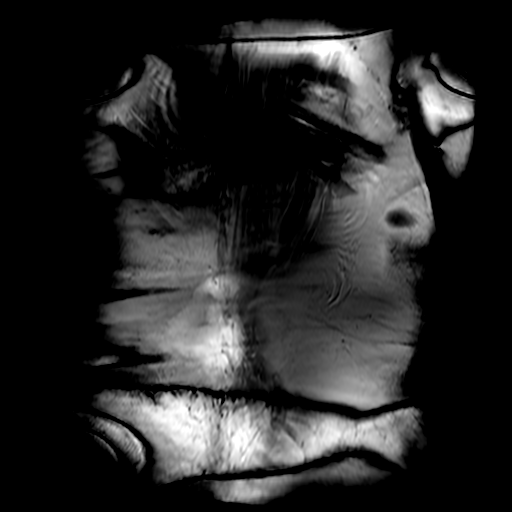

[Series 12: T1 dynamic · coronal · 3.4mm · 1.56mm/px · 4 of 150 slices shown (1 of 2)]
[im 1/150]
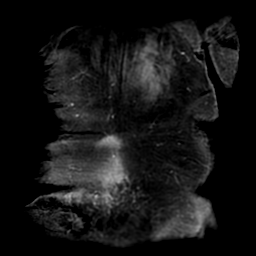
[im 50/150]
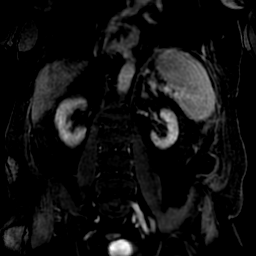
[im 100/150]
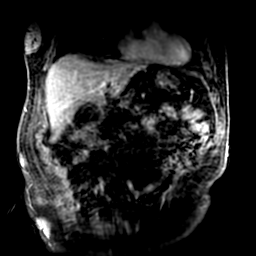
[im 150/150]
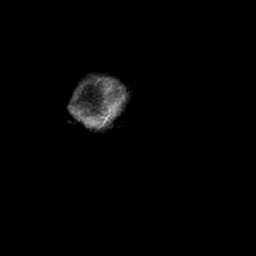

[Series 651: projection images · axial · 1.2mm · 0.30mm/px · 1 of 15 slices shown (1 of 2)]
[im 1/15]
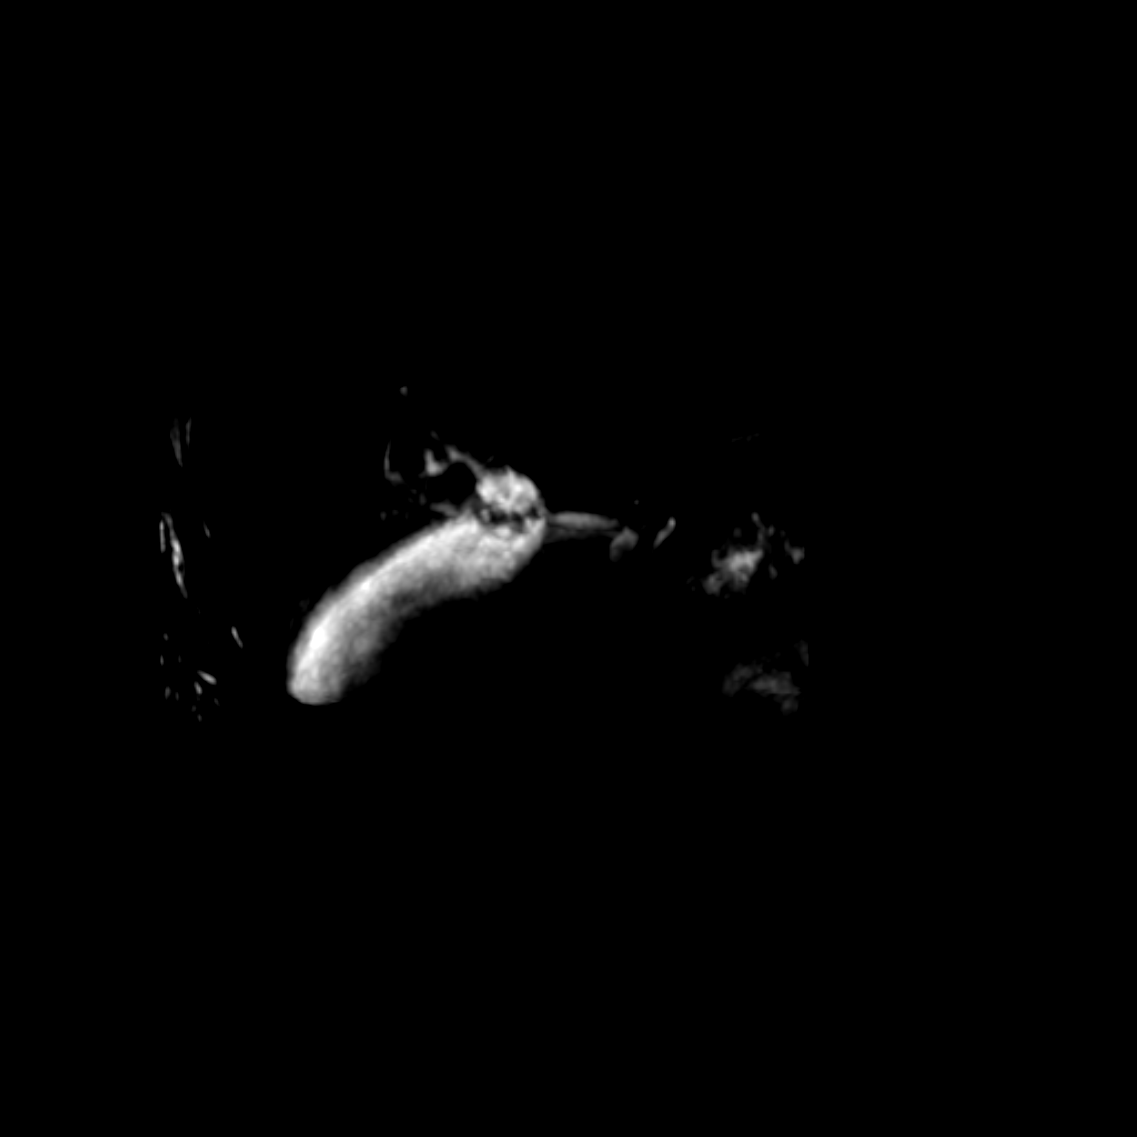

[Series 652: projection images · sagittal · 1.2mm · 0.30mm/px · 1 of 14 slices shown (2 of 2)]
[im 1/14]
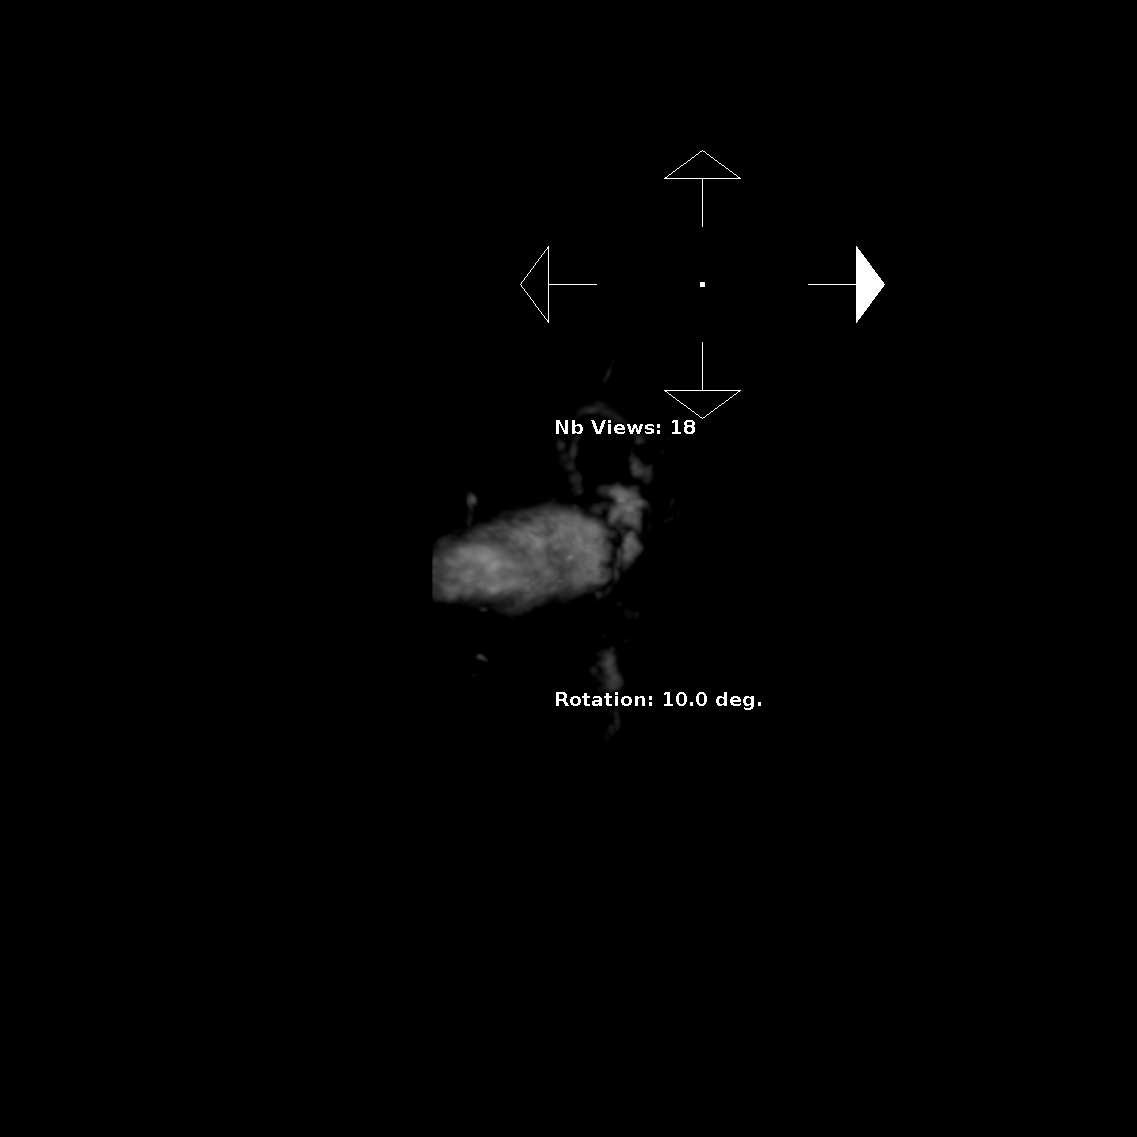

[Series 850: ADC · axial · 8.0mm · 1.48mm/px · 1 of 23 slices shown]
[im 1/23]
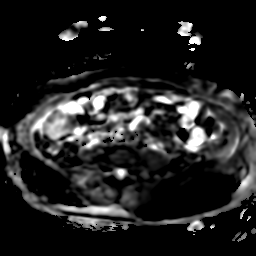

[Series 901: T1 dynamic · axial · 5.0mm · 0.82mm/px · z∈[+33,+266]mm · 2 of 94 slices shown (2 of 2)]
[im 1/94]
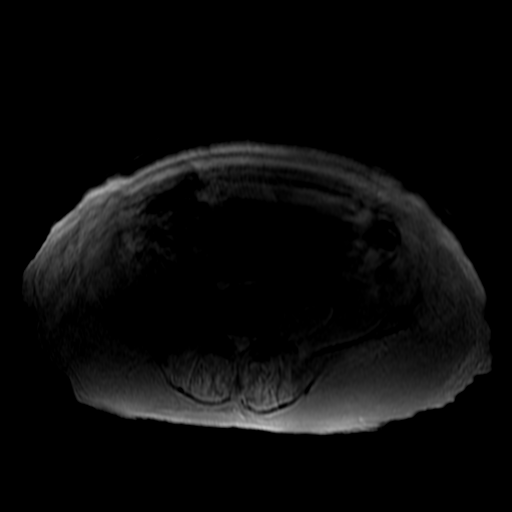
[im 94/94]
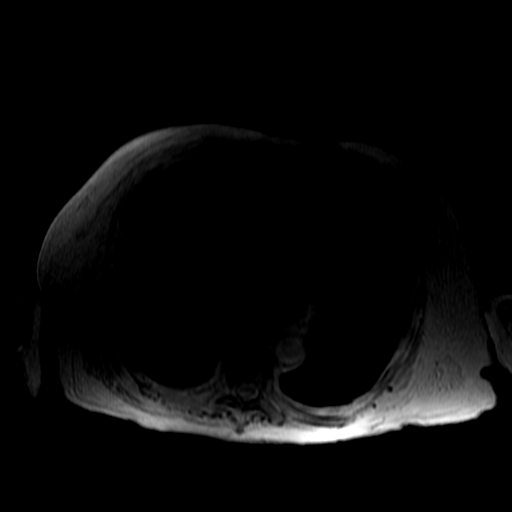

[15 of 48 positions shown; findings below may reference images not displayed]

FINDINGS: Portions of exam are moderately motion degraded.

Lower chest: Moderate hiatal hernia. Trace, right larger than left
pleural effusions. Normal heart size.

Hepatobiliary: No cirrhosis or focal liver lesion.

Multiple gallstones including up to 8 mm. No specific evidence of
acute cholecystitis. Borderline to mild gallbladder distension at
9.3 cm.

No intrahepatic biliary duct dilatation. Although the common duct is
normal in caliber for age at 7 mm on [DATE],a distal common duct stone
measures on the order of 7 mm, including on [DATE], [DATE]. Suspect
smaller dependent stones within the more proximal common duct
including on [DATE] and 82/6. Of note, MRCP images are significantly
motion degraded.

Pancreas:  Markedly atrophic.

Spleen:  Normal in size, without focal abnormality.

Adrenals/Urinary Tract: Normal adrenal glands. Mild renal cortical
thinning bilaterally. Bilateral small renal cysts, without
suspicious renal lesion or hydronephrosis.

Stomach/Bowel: Normal distal most stomach and abdominal bowel loops.

Vascular/Lymphatic: Aortic atherosclerosis. Interval development of
left portal vein thrombus, including on 45/5583. Concurrent edema
including on [DATE].

The splenic vein is insufficient and likely chronically thrombosed
with resultant collaterals in a gastroepiploic distribution. No
abdominal adenopathy.

Other:  No ascites.

Musculoskeletal: No acute osseous abnormality.
IMPRESSION: 1. Moderately motion degraded exam.
2. Choledocholithiasis. Distal common duct stone of approximately 7
mm. Possible smaller more proximal common duct stones. No
significant biliary duct dilatation.
3. Development of left portal vein thrombosis since 06/23/2021 CT.
4. Cholelithiasis without acute cholecystitis. Borderline to mild
gallbladder distension is chronic.
5. Pancreatic atrophy, likely related to prior episodes of
pancreatitis. Gastroepiploic collaterals suggest chronic splenic
vein insufficiency.

These results will be called to the ordering clinician or
representative by the Radiologist Assistant, and communication
documented in the PACS or [REDACTED].

## 2022-06-17 ENCOUNTER — Ambulatory Visit: Payer: Medicare Other | Admitting: Nurse Practitioner

## 2022-06-29 ENCOUNTER — Ambulatory Visit: Payer: Medicare Other | Attending: Nurse Practitioner | Admitting: Nurse Practitioner

## 2022-06-29 ENCOUNTER — Other Ambulatory Visit: Payer: Self-pay | Admitting: Nurse Practitioner

## 2022-06-29 ENCOUNTER — Encounter: Payer: Self-pay | Admitting: Nurse Practitioner

## 2022-06-29 ENCOUNTER — Ambulatory Visit: Payer: Medicare Other | Attending: Nurse Practitioner

## 2022-06-29 ENCOUNTER — Telehealth: Payer: Self-pay | Admitting: Nurse Practitioner

## 2022-06-29 VITALS — BP 120/90 | HR 51 | Ht 69.0 in | Wt 162.2 lb

## 2022-06-29 DIAGNOSIS — I10 Essential (primary) hypertension: Secondary | ICD-10-CM

## 2022-06-29 DIAGNOSIS — R001 Bradycardia, unspecified: Secondary | ICD-10-CM | POA: Diagnosis not present

## 2022-06-29 DIAGNOSIS — I81 Portal vein thrombosis: Secondary | ICD-10-CM | POA: Diagnosis not present

## 2022-06-29 DIAGNOSIS — I4891 Unspecified atrial fibrillation: Secondary | ICD-10-CM

## 2022-06-29 NOTE — Patient Instructions (Signed)
Medication Instructions:  Your physician recommends that you continue on your current medications as directed. Please refer to the Current Medication list given to you today.   Labwork: none  Testing/Procedures: ZIO- Long Term Monitor Instructions   Your physician has requested you wear your ZIO patch monitor 3 days.   This is a single patch monitor.  [employer] supplies one patch monitor per enrollment.  Additional stickers are not available.   Please do not apply patch if you will be having a Nuclear Stress Test, Echocardiogram, Cardiac CT, MRI, or Chest Xray during the time frame you would be wearing the monitor. The patch cannot be worn during these tests.  You cannot remove and re-apply the ZIO XT patch monitor.   While looking in a mirror, press and release button in center of patch.  A small green light will flash 3-4 times .  This will be your only indicator the monitor has been turned on.     Do not shower for the first 24 hours.  You may shower after the first 24 hours.   Press button if you feel a symptom. You will hear a small click.  Record Date, Time and Symptom in the Patient Log Book.   When you are ready to remove patch, follow instructions on last 2 pages of Patient Log Book.  Stick patch monitor onto last page of Patient Log Book.   Place Patient Log Book in Blue box.  Use locking tab [place] box and tape box closed securely.  The [employer] and [employer] has [employer] on it.  Please place in mailbox as soon as possible.  Your physician should have your test results approximately 7 days after the monitor has been mailed back to [facility].   Call [facility] Customer Care at [phone] if you have questions regarding your ZIO XT patch monitor.  Call them immediately if you see an orange light blinking on your monitor.   If your monitor falls off in less than 4 days contact our Monitor department at [phone].  If your monitor becomes loose or falls off after 4  days call [employer] at [phone] for suggestions on securing your monitor.   Follow-Up:  Your physician recommends that you schedule a follow-up appointment in: 6 months  Any Other Special Instructions Will Be Listed Below (If Applicable).  If you need a refill on your cardiac medications before your next appointment, please call your pharmacy.

## 2022-06-29 NOTE — Telephone Encounter (Signed)
3 day ZIO XT, placed and enrolled 4/16 Philis Nettle, [place])

## 2022-06-29 NOTE — Progress Notes (Signed)
Cardiology Office Note:    Date:  06/29/2022  ID:  Jon Bennett, DOB 1934/09/12, MRN [id number]  PCP:  Toma Deiters, MD   [facility] Providers Cardiologist:  Dina Rich, MD     Referring MD: Toma Deiters, MD   CC: Here for follow-up  History of Present Illness:    Jon Bennett is a 88 y.o. male with a hx of the following:  A-fib Portal vein thrombosis (on [facility]) Hyperlipidemia Hypertension GERD Prior TIA Alcohol use disorder  Was admitted in May 2023 for cholangitis and presented back with abdominal pain a few days later to hospital.  Bilirubin was elevated but relieved without intervention.  Cardiology was consulted as he was noted to have episodes of A-fib with RVR.  Was started on amiodarone, converted to normal sinus rhythm prior to discharge.  He was advised to discontinue amiodarone in July 2023 and return for follow-up in 6 months.  Was seen in the ED in August 2023 for leg swelling.  Patient admitted to high sodium intake.  He was given IV Lasix and advised to take oral Lasix until leg swelling resolved.  Last seen by Eligha Bridegroom, NP on October 27, 2021.  Was overall doing well from a cardiac perspective and denied any acute cardiac complaints.  Lasix was reduced to 20 mg daily.  No other medication changes were made.  Discussed to follow-up with Dr. Dina Rich or APP in 4 to 5 months or sooner if anything change.  I last saw this patient in January 2024.  He was doing well at the time.  Today presents for follow-up with his son. Admits to random MSK pains, takes Tylenol that helps relieve this.  Denies recent any chest pain, shortness of breath, palpitations, syncope, presyncope, dizziness, orthopnea, PND, swelling, significant weight changes, acute bleeding, claudication.  Lives with son who is present for visit today.  Past Medical History:  Diagnosis Date   Diabetes mellitus without complication    Patient is not on any medication  for this   GERD (gastroesophageal reflux disease)    Hypertension    Pancreatitis, alcoholic, acute    Portal vein thrombosis 07/01/2021   Splenic vein thrombosis    Stroke    mini strokes    Past Surgical History:  Procedure Laterality Date   APPENDECTOMY     CAROTID ANGIOGRAPHY     CHOLECYSTECTOMY N/A 08/06/2021   Procedure: LAPAROSCOPIC CHOLECYSTECTOMY;  Surgeon: Franky Macho, MD;  Location: [facility] ORS;  Service: General;  Laterality: N/A;   ERCP N/A 07/03/2021   Procedure: ENDOSCOPIC RETROGRADE CHOLANGIOPANCREATOGRAPHY (ERCP);  Surgeon: Lynann Bologna, MD;  Location: [facility] ENDOSCOPY;  Service: Gastroenterology;  Laterality: N/A;   esophageal tear repair     ESOPHAGOGASTRODUODENOSCOPY (EGD) WITH PROPOFOL N/A 06/25/2021   Procedure: ESOPHAGOGASTRODUODENOSCOPY (EGD) WITH PROPOFOL;  Surgeon: Malissa Hippo, MD;  Location: [facility] ENDO SUITE;  Service: Endoscopy;  Laterality: N/A;   REMOVAL OF STONES  07/03/2021   Procedure: REMOVAL OF STONES;  Surgeon: Lynann Bologna, MD;  Location: [facility] ENDOSCOPY;  Service: Gastroenterology;;   Dennison Mascot  07/03/2021   Procedure: Dennison Mascot;  Surgeon: Lynann Bologna, MD;  Location: [facility] ENDOSCOPY;  Service: Gastroenterology;;   VASCULAR SURGERY      Current Meds  Medication Sig   acetaminophen (TYLENOL) 500 MG tablet Take 500 mg by mouth daily as needed for mild pain or moderate pain.   busPIRone (BUSPAR) 10 MG tablet Take 10 mg by mouth 2 (two) times daily.  doxepin (SINEQUAN) 10 MG capsule Take 10 mg by mouth at bedtime.   ELIQUIS 5 MG TABS tablet TAKE 1 TABLET BY MOUTH TWICE A DAY (Patient taking differently: Take 5 mg by mouth 2 (two) times daily.)   famotidine (PEPCID) 20 MG tablet Take 20 mg by mouth 2 (two) times daily.   furosemide (LASIX) 20 MG tablet Take 1 tablet (20 mg total) by mouth daily.   metoprolol tartrate (LOPRESSOR) 25 MG tablet Take 0.5 tablets (12.5 mg total) by mouth 2 (two) times daily.   mirtazapine (REMERON) 15 MG tablet Take  15 mg by mouth at bedtime.   Multiple Vitamin (MULTIVITAMIN WITH MINERALS) TABS tablet Take 1 tablet by mouth daily.   sertraline (ZOLOFT) 25 MG tablet Take 1 tablet by mouth daily at 6 (six) AM.   tamsulosin (FLOMAX) 0.4 MG CAPS capsule Take 0.4 mg by mouth as needed.   thiamine 100 MG tablet Take 1 tablet (100 mg total) by mouth daily.   Allergies:   Asa [aspirin]   Social History   Socioeconomic History   Marital status: Widowed    Spouse name: Not on file   Number of children: 4   Years of education: Not on file   Highest education level: Not on file  Occupational History   Occupation: retired  Tobacco Use   Smoking status: Never    Passive exposure: Never   Smokeless tobacco: Never  Vaping Use   Vaping Use: Never used  Substance and Sexual Activity   Alcohol use: Not Currently    Alcohol/week: 1.0 standard drink of alcohol    Types: 1 Shots of liquor per week   Drug use: Never   Sexual activity: Not Currently  Other Topics Concern   Not on file  Social History Narrative   Not on file   Social Determinants of Health   Financial Resource Strain: Low Risk  (02/21/2020)   Overall Financial Resource Strain (CARDIA)    Difficulty of Paying Living Expenses: Not hard at all  Food Insecurity: No Food Insecurity (02/21/2020)   Hunger Vital Sign    Worried About Running Out of Food in the Last Year: Never true    Ran Out of Food in the Last Year: Never true  Transportation Needs: No Transportation Needs (02/21/2020)   PRAPARE - Administrator, Civil Service (Medical): No    Lack of Transportation (Non-Medical): No  Physical Activity: Insufficiently Active (02/21/2020)   Exercise Vital Sign    Days of Exercise per Week: 3 days    Minutes of Exercise per Session: 30 min  Stress: No Stress Concern Present (02/21/2020)   [employer] of Occupational Health - Occupational Stress Questionnaire    Feeling of Stress : Not at all  Social Connections: Moderately  Integrated (02/21/2020)   Social Connection and Isolation Panel [NHANES]    Frequency of Communication with Friends and Family: More than three times a week    Frequency of Social Gatherings with Friends and Family: More than three times a week    Attends Religious Services: 1 to 4 times per year    Active Member of [employer] or Organizations: No    Attends Banker Meetings: 1 to 4 times per year    Marital Status: Widowed     Family History: The patient's family history includes Cancer in his maternal grandfather; Healthy in his brother, brother, brother, brother, sister, sister, sister, sister, son, son, son, and son; Heart disease in his father;  Hypertension in an other family member.  ROS:     Please see the history of present illness.    All other systems reviewed and are negative.  EKGs/Labs/Other Studies Reviewed:    The following studies were reviewed today:   EKG:  EKG is not ordered today.     Echocardiogram on 08/11/2021: 1.  Left ventricular ejection fraction by estimation is 60 to 65%.  The left ventricle has normal function.  The left ventricle has no regional wall motion abnormalities.  There is mild left ventricular hypertrophy.  Indeterminate left ventricular diastolic parameters.  Elevated left atrial pressure. 2.  Right ventricular systolic function is normal.  The right ventricular size is normal. 3.  The mitral valve is normal in structure.  No evidence of mitral valve regurgitation.  No evidence of mitral stenosis. 4.  The tricuspid valve is abnormal. 5.  The aortic valve is tricuspid.  Aortic valve regurgitation is not visualized.  No aortic stenosis is present.  Recent Labs: 08/09/2021: TSH 0.970 08/11/2021: Magnesium 1.9 10/19/2021: ALT 13; B Natriuretic Peptide 711.0; BUN 18; Creatinine, Ser 0.92; Hemoglobin 10.2; Platelets 121; Potassium 4.1; Sodium 134  Recent Lipid Panel No results found for: "CHOL", "TRIG", "HDL", "CHOLHDL", "VLDL", "LDLCALC",  "LDLDIRECT"   Risk Assessment/Calculations:    CHA2DS2-VASc Score = 4  This indicates a 4.8% annual risk of stroke. The patient's score is based upon: CHF History: 0 HTN History: 1 Diabetes History: 1 Stroke History: 0 Vascular Disease History: 0 Age Score: 2 Gender Score: 0    Physical Exam:    VS:  BP (!) 120/90 (BP Location: Left Arm, Patient Position: Sitting, Cuff Size: Normal)   Pulse (!) 51   Ht  (1.753 m)   Wt 162 lb 3.2 oz (73.6 kg)   SpO2 99%   BMI 23.95 kg/m     Wt Readings from Last 3 Encounters:  06/29/22 162 lb 3.2 oz (73.6 kg)  03/18/22 160 lb 12.8 oz (72.9 kg)  10/27/21 141 lb (64 kg)     GEN: Well nourished, well developed in no acute distress HEENT: Normal NECK: No JVD; No carotid bruits CARDIAC: S1/S2, irregular rhythm and slow rate, 51 bpm, no murmurs, rubs, gallops; 2+ pulses throughout RESPIRATORY:  Clear to auscultation without rales, wheezing or rhonchi  MUSCULOSKELETAL:  No edema; thoracic kyphosis noted  SKIN: Thin skin, warm and dry NEUROLOGIC:  Alert and oriented x 3 PSYCHIATRIC:  Normal affect   ASSESSMENT:    1. Atrial fibrillation, unspecified type   2. Bradycardia   3. Essential hypertension, benign   4. Portal vein thrombosis     PLAN:    In order of problems listed above:  A-fib, bradycardia Slow rate and irregular rhythm on exam today. No palpitations or tachycardia. 51 bpm on exam, asymptomatic. HR was 50 bpm at last visit. Past labs reviewed, overall unremarkable. Discontinued Amiodarone at last visit.  Continue rest of medication regimen, will verify med list with son and let us know, unsure during today's visit.  Tolerating Eliquis well, denies any bleeding problems.  Currently on appropriate dosage of Eliquis.  If weight drops less than 60 kg in the future, will need to redose Eliquis to 2.5 mg twice daily. Heart healthy diet and regular cardiovascular exercise as tolerated encouraged. Will arrange 3 day Zio XT  monitor to evaluate his bradycardia, may need to stop Lopressor depending on monitor results.   HTN Blood pressure stable today.  BP well-controlled at home. Discussed to monitor  BP at home at least 2 hours after medications and sitting for 5-10 minutes.  Continue current medication regimen. Heart healthy diet and regular cardiovascular exercise as tolerated encouraged.   Hx of portal vein thrombosis Denies any concerning symptoms.  Tolerating Eliquis well.  Continue Eliquis 5 mg twice daily.  4. Disposition: Follow-up with me or Dr. Dina Rich in 6 months or sooner if anything changes.   Medication Adjustments/Labs and Tests Ordered: Current medicines are reviewed at length with the patient today.  Concerns regarding medicines are outlined above.  No orders of the defined types were placed in this encounter.  No orders of the defined types were placed in this encounter.   Patient Instructions  Medication Instructions:  Your physician recommends that you continue on your current medications as directed. Please refer to the Current Medication list given to you today.   Labwork: none  Testing/Procedures: ZIO- Long Term Monitor Instructions   Your physician has requested you wear your ZIO patch monitor 3 days.   This is a single patch monitor.  Ir[employer] supplies one patch monitor per enrollment.  Additional stickers are not available.   Please do not apply patch if you will be having a Nuclear Stress Test, Echocardiogram, Cardiac CT, MRI, or Chest Xray during the time frame you would be wearing the monitor. The patch cannot be worn during these tests.  You cannot remove and re-apply the ZIO XT patch monitor.   While looking in a mirror, press and release button in center of patch.  A small green light will flash 3-4 times .  This will be your only indicator the monitor has been turned on.     Do not shower for the first 24 hours.  You may shower after the first 24 hours.    Press button if you feel a symptom. You will hear a small click.  Record Date, Time and Symptom in the Patient Log Book.   When you are ready to remove patch, follow instructions on last 2 pages of Patient Log Book.  Stick patch monitor onto last page of Patient Log Book.   Place Patient Log Book in [place] box.  Use locking tab on box and tape box closed securely.  The [employer] and [employer] has [employer] on it.  Please place in mailbox as soon as possible.  Your physician should have your test results approximately 7 days after the monitor has been mailed back to [facility].   Call [facility] Customer Care at [phone] if you have questions regarding your ZIO XT patch monitor.  Call them immediately if you see an orange light blinking on your monitor.   If your monitor falls off in less than 4 days contact our Monitor department at [phone].  If your monitor becomes loose or falls off after 4 days call [employer] at [phone] for suggestions on securing your monitor.   Follow-Up:  Your physician recommends that you schedule a follow-up appointment in: 6 months  Any Other Special Instructions Will Be Listed Below (If Applicable).  If you need a refill on your cardiac medications before your next appointment, please call your pharmacy.    SignedSharlene Dory, NP  06/29/2022 12:28 PM     [facility]

## 2022-07-09 DIAGNOSIS — R001 Bradycardia, unspecified: Secondary | ICD-10-CM | POA: Diagnosis not present

## 2022-08-10 DIAGNOSIS — M25512 Pain in left shoulder: Secondary | ICD-10-CM | POA: Diagnosis not present

## 2022-08-10 DIAGNOSIS — M542 Cervicalgia: Secondary | ICD-10-CM | POA: Diagnosis not present

## 2022-08-10 DIAGNOSIS — I10 Essential (primary) hypertension: Secondary | ICD-10-CM | POA: Diagnosis not present

## 2022-08-10 DIAGNOSIS — K219 Gastro-esophageal reflux disease without esophagitis: Secondary | ICD-10-CM | POA: Diagnosis not present

## 2022-08-10 DIAGNOSIS — Z0000 Encounter for general adult medical examination without abnormal findings: Secondary | ICD-10-CM | POA: Diagnosis not present

## 2022-09-02 NOTE — Progress Notes (Signed)
NALM-vg

## 2022-09-15 ENCOUNTER — Telehealth: Payer: Self-pay | Admitting: Nurse Practitioner

## 2022-09-15 NOTE — Telephone Encounter (Signed)
Patient states he is returning a call received yesterday, but he does not know what it was regarding.

## 2022-09-15 NOTE — Telephone Encounter (Signed)
No documentation of a call yesterday the only call listed was in April of this year

## 2022-11-27 ENCOUNTER — Other Ambulatory Visit: Payer: Self-pay | Admitting: Nurse Practitioner

## 2022-12-23 ENCOUNTER — Ambulatory Visit: Payer: Medicare Other | Admitting: Cardiology

## 2022-12-23 ENCOUNTER — Encounter: Payer: Self-pay | Admitting: Cardiology

## 2023-03-01 DIAGNOSIS — I10 Essential (primary) hypertension: Secondary | ICD-10-CM | POA: Diagnosis not present

## 2023-03-01 DIAGNOSIS — K219 Gastro-esophageal reflux disease without esophagitis: Secondary | ICD-10-CM | POA: Diagnosis not present

## 2023-03-01 DIAGNOSIS — I82513 Chronic embolism and thrombosis of femoral vein, bilateral: Secondary | ICD-10-CM | POA: Diagnosis not present

## 2023-03-01 DIAGNOSIS — Z0000 Encounter for general adult medical examination without abnormal findings: Secondary | ICD-10-CM | POA: Diagnosis not present

## 2023-03-01 DIAGNOSIS — M542 Cervicalgia: Secondary | ICD-10-CM | POA: Diagnosis not present

## 2023-03-01 DIAGNOSIS — M25512 Pain in left shoulder: Secondary | ICD-10-CM | POA: Diagnosis not present

## 2023-03-01 DIAGNOSIS — F330 Major depressive disorder, recurrent, mild: Secondary | ICD-10-CM | POA: Diagnosis not present

## 2023-03-23 ENCOUNTER — Encounter: Payer: Self-pay | Admitting: Nurse Practitioner

## 2023-03-23 ENCOUNTER — Ambulatory Visit: Payer: Medicare Other | Admitting: Nurse Practitioner

## 2023-03-23 ENCOUNTER — Ambulatory Visit: Payer: Medicare Other | Attending: Nurse Practitioner | Admitting: Nurse Practitioner

## 2023-03-23 VITALS — BP 128/72 | HR 80 | Ht 68.0 in | Wt 157.8 lb

## 2023-03-23 DIAGNOSIS — M791 Myalgia, unspecified site: Secondary | ICD-10-CM

## 2023-03-23 DIAGNOSIS — I81 Portal vein thrombosis: Secondary | ICD-10-CM

## 2023-03-23 DIAGNOSIS — I10 Essential (primary) hypertension: Secondary | ICD-10-CM | POA: Diagnosis not present

## 2023-03-23 DIAGNOSIS — R600 Localized edema: Secondary | ICD-10-CM

## 2023-03-23 DIAGNOSIS — I4891 Unspecified atrial fibrillation: Secondary | ICD-10-CM | POA: Diagnosis not present

## 2023-03-23 MED ORDER — FUROSEMIDE 20 MG PO TABS: 20.0000 mg | ORAL_TABLET | Freq: Every day | ORAL | Status: AC | PRN

## 2023-03-23 NOTE — Patient Instructions (Signed)
 Medication Instructions:   Change Lasix  to daily as needed for leg swelling  Continue all other medications.     Labwork:  none  Testing/Procedures:  none  Follow-Up:  6 months   Any Other Special Instructions Will Be Listed Below (If Applicable).   If you need a refill on your cardiac medications before your next appointment, please call your pharmacy.

## 2023-03-23 NOTE — Progress Notes (Signed)
 Cardiology Office Note:    Date:  03/23/2023 ID:  Jon Bennett, DOB September 05, 1934, MRN [id number] PCP:  Orpha Yancey LABOR, MD [facility] Providers Cardiologist:  [name], MD     Referring MD: Orpha Yancey LABOR, MD   CC: Here for 6 month follow-up  History of Present Illness:    Jon Bennett is a 89 y.o. male with a PMH of A-fib, HLD, HTN, hx of TIA, hx of portal vein thrombosis, GERD, and hx of alcohol use disorder, who presents today for 6 month follow-up.   Was admitted in May 2023 for cholangitis and presented back with abdominal pain a few days later to hospital.  Bilirubin was elevated but relieved without intervention.  Cardiology was consulted as he was noted to have episodes of A-fib with RVR.  Was started on amiodarone , converted to normal sinus rhythm prior to discharge.  He was advised to discontinue amiodarone  in July 2023 and return for follow-up in 6 months.  Was seen in the ED in August 2023 for leg swelling.  Patient admitted to high sodium intake.  He was given IV Lasix  and advised to take oral Lasix  until leg swelling resolved.  I last saw patient on June 29, 2022 for follow-up. Admitted to random MSK pains, was taking Tylenol  that helps relieve this.  Denied recent any chest pain, shortness of breath, palpitations, syncope, presyncope, dizziness, orthopnea, PND, swelling, significant weight changes, acute bleeding, claudication. Monitor arranged for bradycardia - see result below.  Today he presents for 6 month follow-up. Overall doing well from a cardiac perspective. Admits to generalized upper body aches/pains, sleeps on 2 pillows but believes he needs to get a new pillow to relieve his neck/upper back/shoulder pain. Denies any chest pain, shortness of breath, palpitations, syncope, presyncope, dizziness, orthopnea, PND, swelling or significant weight changes, acute bleeding, or claudication.  SH: Lives with son who is present for visit today.  ROS:     Please see the history of present illness.    All other systems reviewed and are negative.  EKGs/Labs/Other Studies Reviewed:    The following studies were reviewed today:   EKG:  EKG is not ordered today.    Cardiac monitor 08/2022:    Predominantly normal sinus rhythm ranging between 38 and 162 bpm with average HR 59 bpm   4 runs of SVT, fastest/longest lasting 4.3 seconds   1 run of NSVT lasting 13.5 seconds   No atrial arrhythmias   No AV block or pauses   1.5% PAC burden and <1% PVC burden   No patient triggered events  Echocardiogram on 08/11/2021: 1.  Left ventricular ejection fraction by estimation is 60 to 65%.  The left ventricle has normal function.  The left ventricle has no regional wall motion abnormalities.  There is mild left ventricular hypertrophy.  Indeterminate left ventricular diastolic parameters.  Elevated left atrial pressure. 2.  Right ventricular systolic function is normal.  The right ventricular size is normal. 3.  The mitral valve is normal in structure.  No evidence of mitral valve regurgitation.  No evidence of mitral stenosis. 4.  The tricuspid valve is abnormal. 5.  The aortic valve is tricuspid.  Aortic valve regurgitation is not visualized.  No aortic stenosis is present.   Physical Exam:    VS:  BP 128/72   Pulse 80   Ht 5' 8 (1.727 m)   Wt 157 lb 12.8 oz (71.6 kg)   SpO2 98%   BMI 23.99  kg/m     Wt Readings from Last 3 Encounters:  03/23/23 157 lb 12.8 oz (71.6 kg)  06/29/22 162 lb 3.2 oz (73.6 kg)  03/18/22 160 lb 12.8 oz (72.9 kg)     GEN: Well nourished, well developed in no acute distress HEENT: Normal NECK: No JVD; No carotid bruits CARDIAC: S1/S2, irregularly irregular rhythm, no murmurs, rubs, gallops; 2+ pulses throughout RESPIRATORY:  Clear to auscultation without rales, wheezing or rhonchi  MUSCULOSKELETAL:  No edema; thoracic kyphosis noted  SKIN: Thin skin, warm and dry NEUROLOGIC:  Alert and oriented x  3 PSYCHIATRIC:  Normal affect   ASSESSMENT & PLAN:    In order of problems listed above:  A-fib  No palpitations or tachycardia. HR well controlled, no indication for AV nodal agents. Want to avoid d/t past hx of bradycardia. Tolerating Eliquis  well, denies any bleeding problems.  Currently on appropriate dosage of Eliquis .  If weight drops less than 60 kg in the future, will need to redose Eliquis  to 2.5 mg twice daily. Heart healthy diet and regular cardiovascular exercise as tolerated encouraged.   HTN Blood pressure stable today.  BP well-controlled at home. Discussed to monitor BP at home at least 2 hours after medications and sitting for 5-10 minutes.  Continue current medication regimen. Heart healthy diet and regular cardiovascular exercise as tolerated encouraged.   Hx of portal vein thrombosis Denies any concerning symptoms.  Tolerating Eliquis  well.  Continue Eliquis  5 mg twice daily.  4. Hx of leg edema Denies any recent issues. Will change Lasix  to 20 mg daily PRN for leg swelling. Continue to follow with PCP.   5. Muscle aches/soreness Discussed/encouraged him to get a new pillow, recommended to continue to f/u with PCP regarding this. Discussed conservative measures. He verbalized understanding.   6. Disposition: Follow-up with me or Dr. Dorn Ross in 6 months or sooner if anything changes.   Medication Adjustments/Labs and Tests Ordered: Current medicines are reviewed at length with the patient today.  Concerns regarding medicines are outlined above.  No orders of the defined types were placed in this encounter.  Meds ordered this encounter  Medications   furosemide  (LASIX ) 20 MG tablet    Sig: Take 1 tablet (20 mg total) by mouth daily as needed for edema (leg swelling).    Dose changed 03/23/2023    Patient Instructions  Medication Instructions:   Change Lasix  to daily as needed for leg swelling  Continue all other medications.      Labwork:  none  Testing/Procedures:  none  Follow-Up:  6 months   Any Other Special Instructions Will Be Listed Below (If Applicable).   If you need a refill on your cardiac medications before your next appointment, please call your pharmacy.    Signed, Almarie Crate, NP

## 2023-07-21 DIAGNOSIS — Z0000 Encounter for general adult medical examination without abnormal findings: Secondary | ICD-10-CM | POA: Diagnosis not present

## 2023-07-21 DIAGNOSIS — I82513 Chronic embolism and thrombosis of femoral vein, bilateral: Secondary | ICD-10-CM | POA: Diagnosis not present

## 2023-07-21 DIAGNOSIS — K219 Gastro-esophageal reflux disease without esophagitis: Secondary | ICD-10-CM | POA: Diagnosis not present

## 2023-07-21 DIAGNOSIS — N182 Chronic kidney disease, stage 2 (mild): Secondary | ICD-10-CM | POA: Diagnosis not present

## 2023-07-21 DIAGNOSIS — I10 Essential (primary) hypertension: Secondary | ICD-10-CM | POA: Diagnosis not present

## 2023-07-21 DIAGNOSIS — M542 Cervicalgia: Secondary | ICD-10-CM | POA: Diagnosis not present

## 2023-07-21 DIAGNOSIS — F330 Major depressive disorder, recurrent, mild: Secondary | ICD-10-CM | POA: Diagnosis not present

## 2024-02-21 DIAGNOSIS — I10 Essential (primary) hypertension: Secondary | ICD-10-CM | POA: Diagnosis not present

## 2024-02-21 DIAGNOSIS — N182 Chronic kidney disease, stage 2 (mild): Secondary | ICD-10-CM | POA: Diagnosis not present

## 2024-02-21 DIAGNOSIS — K219 Gastro-esophageal reflux disease without esophagitis: Secondary | ICD-10-CM | POA: Diagnosis not present

## 2024-02-21 DIAGNOSIS — M542 Cervicalgia: Secondary | ICD-10-CM | POA: Diagnosis not present

## 2024-02-21 DIAGNOSIS — Z0000 Encounter for general adult medical examination without abnormal findings: Secondary | ICD-10-CM | POA: Diagnosis not present
